# Patient Record
Sex: Male | Born: 1945 | Race: White | Hispanic: No | Marital: Married | Smoking: Never smoker
Health system: Southern US, Community
[De-identification: ages and names within clinical notes are randomized; demographics above are authoritative.]

## PROBLEM LIST (undated history)

## (undated) DIAGNOSIS — U071 COVID-19: Secondary | ICD-10-CM

## (undated) DIAGNOSIS — I442 Atrioventricular block, complete: Secondary | ICD-10-CM

## (undated) DIAGNOSIS — M1711 Unilateral primary osteoarthritis, right knee: Secondary | ICD-10-CM

## (undated) DIAGNOSIS — K4020 Bilateral inguinal hernia, without obstruction or gangrene, not specified as recurrent: Secondary | ICD-10-CM

## (undated) DIAGNOSIS — J339 Nasal polyp, unspecified: Secondary | ICD-10-CM

## (undated) DIAGNOSIS — T7020XA Unspecified effects of high altitude, initial encounter: Secondary | ICD-10-CM

## (undated) DIAGNOSIS — J45909 Unspecified asthma, uncomplicated: Secondary | ICD-10-CM

## (undated) DIAGNOSIS — I441 Atrioventricular block, second degree: Secondary | ICD-10-CM

## (undated) DIAGNOSIS — M75101 Unspecified rotator cuff tear or rupture of right shoulder, not specified as traumatic: Secondary | ICD-10-CM

## (undated) DIAGNOSIS — M1990 Unspecified osteoarthritis, unspecified site: Secondary | ICD-10-CM

## (undated) DIAGNOSIS — G4700 Insomnia, unspecified: Secondary | ICD-10-CM

## (undated) HISTORY — PX: REFRACTIVE SURGERY: SHX103

## (undated) HISTORY — PX: ROTATOR CUFF REPAIR: SHX139

## (undated) HISTORY — PX: HERNIA REPAIR: SHX51

## (undated) HISTORY — DX: Atrioventricular block, complete: I44.2

## (undated) HISTORY — PX: NASAL SINUS SURGERY: SHX719

## (undated) HISTORY — PX: EYE SURGERY: SHX253

## (undated) HISTORY — DX: Nasal polyp, unspecified: J33.9

## (undated) HISTORY — DX: Atrioventricular block, second degree: I44.1

## (undated) HISTORY — DX: Unspecified asthma, uncomplicated: J45.909

## (undated) HISTORY — DX: COVID-19: U07.1

## (undated) HISTORY — DX: Bilateral inguinal hernia, without obstruction or gangrene, not specified as recurrent: K40.20

## (undated) HISTORY — DX: Insomnia, unspecified: G47.00

## (undated) HISTORY — DX: Unilateral primary osteoarthritis, right knee: M17.11

## (undated) HISTORY — PX: CATARACT EXTRACTION W/ INTRAOCULAR LENS IMPLANT: SHX1309

## (undated) HISTORY — DX: Unspecified effects of high altitude, initial encounter: T70.20XA

## (undated) HISTORY — DX: Unspecified osteoarthritis, unspecified site: M19.90

## (undated) HISTORY — PX: BACK SURGERY: SHX140

## (undated) HISTORY — PX: CARDIAC CATHETERIZATION: SHX172

## (undated) HISTORY — PX: LUMBAR LAMINECTOMY: SHX95

---

## 1999-07-04 ENCOUNTER — Ambulatory Visit (HOSPITAL_BASED_OUTPATIENT_CLINIC_OR_DEPARTMENT_OTHER): Admission: RE | Admit: 1999-07-04 | Discharge: 1999-07-04 | Payer: Self-pay | Admitting: *Deleted

## 2003-10-11 ENCOUNTER — Ambulatory Visit (HOSPITAL_COMMUNITY): Admission: RE | Admit: 2003-10-11 | Discharge: 2003-10-11 | Payer: Self-pay | Admitting: Orthopedic Surgery

## 2006-05-09 ENCOUNTER — Ambulatory Visit: Payer: Self-pay | Admitting: Family Medicine

## 2006-09-25 ENCOUNTER — Ambulatory Visit: Payer: Self-pay | Admitting: Family Medicine

## 2007-10-27 ENCOUNTER — Ambulatory Visit: Payer: Self-pay | Admitting: Family Medicine

## 2008-07-06 ENCOUNTER — Ambulatory Visit: Payer: Self-pay | Admitting: Family Medicine

## 2008-08-31 ENCOUNTER — Ambulatory Visit: Payer: Self-pay | Admitting: Family Medicine

## 2008-10-26 ENCOUNTER — Ambulatory Visit: Payer: Self-pay | Admitting: Family Medicine

## 2008-10-28 ENCOUNTER — Encounter: Admission: RE | Admit: 2008-10-28 | Discharge: 2008-10-28 | Payer: Self-pay | Admitting: Family Medicine

## 2008-10-28 IMAGING — CR DG CHEST 2V
2 series · 2 of 2 positions shown · non-contrast
Comparison: None

CLINICAL DATA: Chronic cough, wheezing

CHEST - 2 VIEW

[view not recorded (1 of 2)]
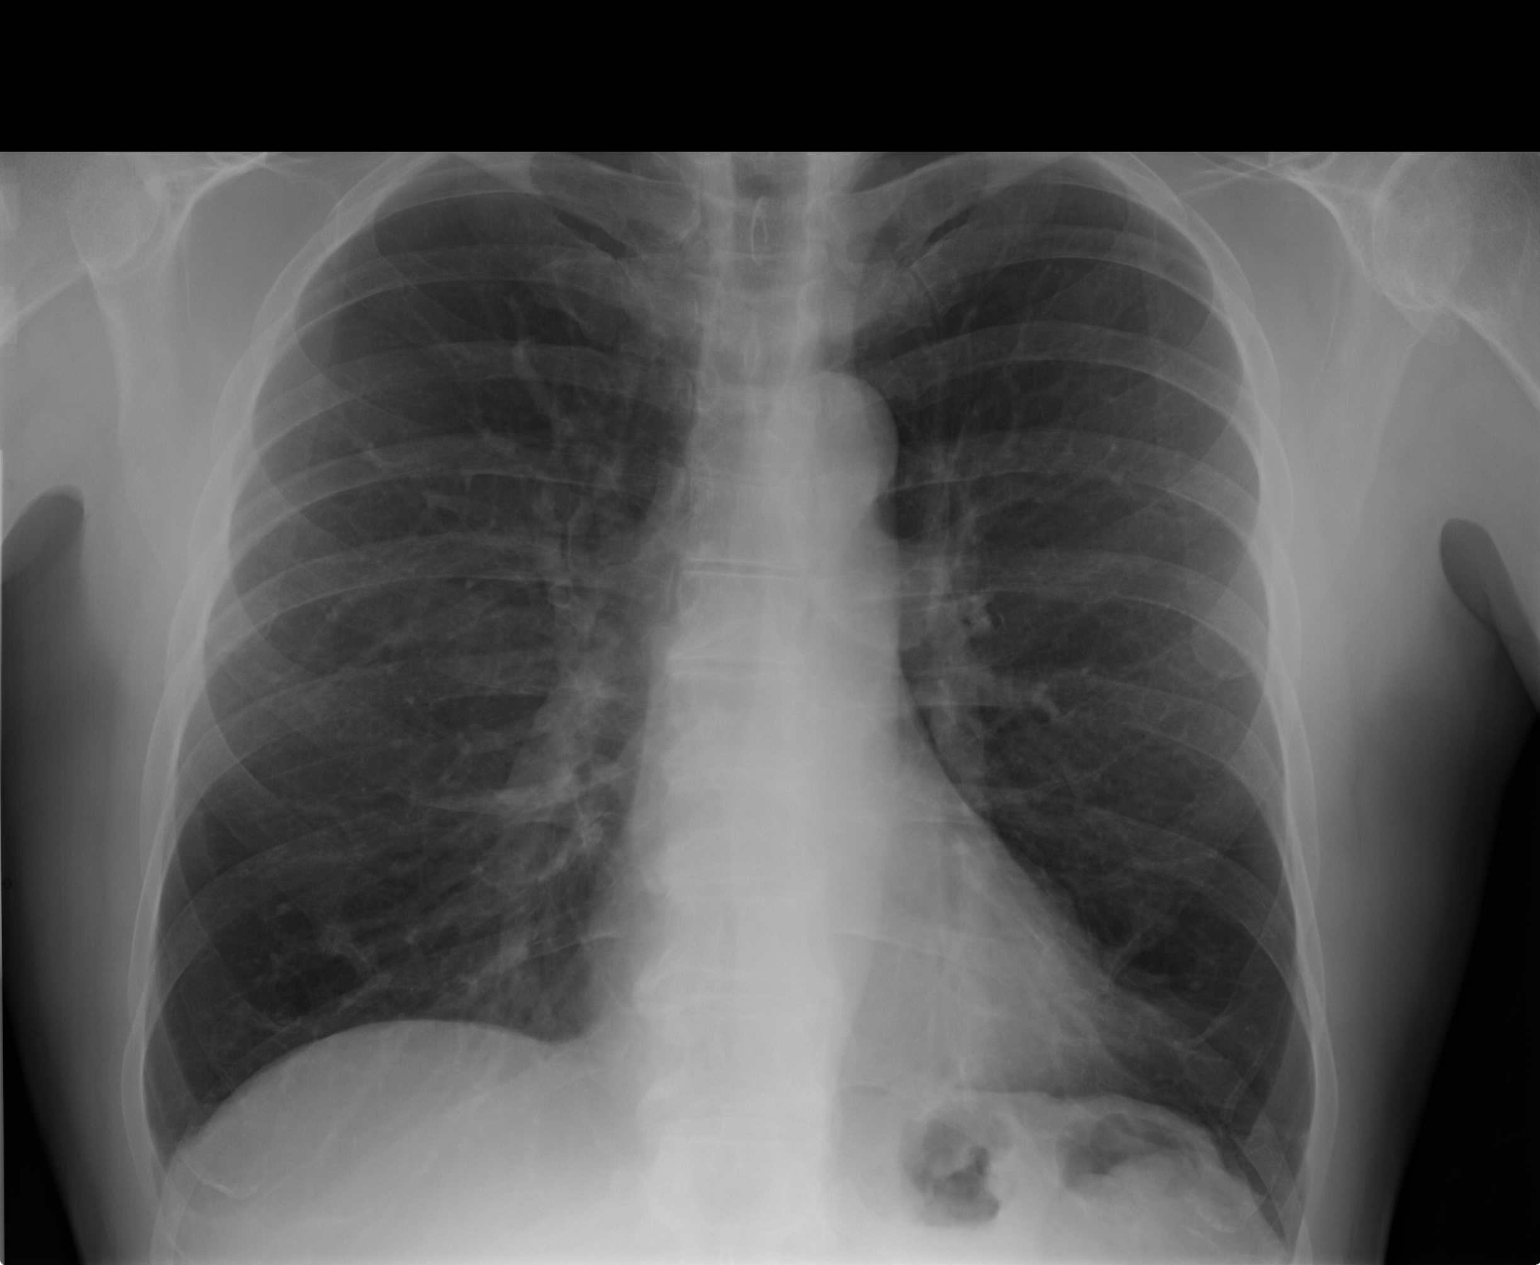

[view not recorded (2 of 2)]
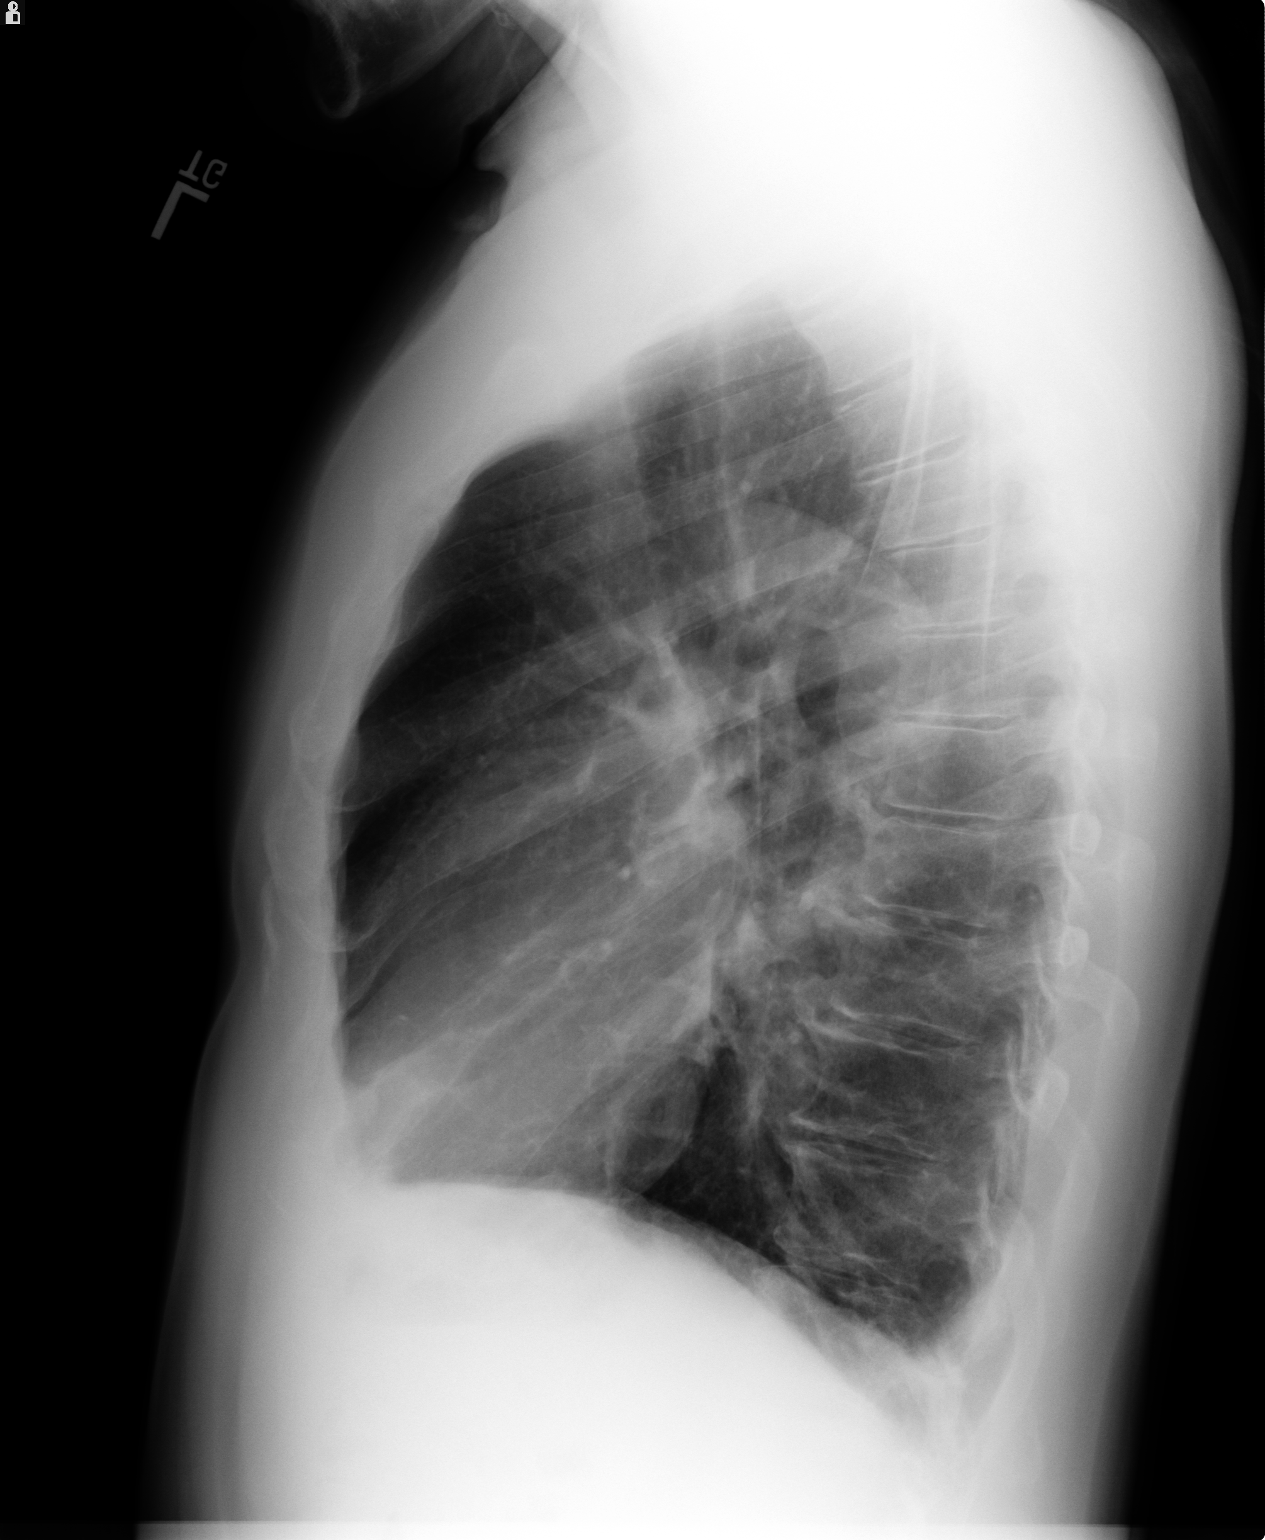

[2 of 2 positions shown; findings below may reference images not displayed]

FINDINGS: The lungs are clear and hyperaerated which may indicate
mild COPD.  No active infiltrate or effusion is seen. The heart is
within normal limits in size. There are degenerative changes
throughout the thoracic spine.
IMPRESSION: Hyperaeration suggests COPD.  No active process is seen.

## 2008-11-22 ENCOUNTER — Ambulatory Visit: Payer: Self-pay | Admitting: Family Medicine

## 2009-11-23 ENCOUNTER — Ambulatory Visit: Payer: Self-pay | Admitting: Physician Assistant

## 2009-11-30 LAB — HM COLONOSCOPY

## 2010-09-22 ENCOUNTER — Encounter: Payer: Self-pay | Admitting: Orthopedic Surgery

## 2011-03-14 ENCOUNTER — Encounter: Payer: Self-pay | Admitting: Family Medicine

## 2011-08-01 ENCOUNTER — Other Ambulatory Visit: Payer: Self-pay | Admitting: Orthopedic Surgery

## 2011-08-02 ENCOUNTER — Encounter (HOSPITAL_BASED_OUTPATIENT_CLINIC_OR_DEPARTMENT_OTHER): Payer: Self-pay | Admitting: *Deleted

## 2011-08-05 NOTE — Pre-Procedure Instructions (Signed)
Called x 3 for EKG to Dr. Joaquin Bend office, not received; will need EKG DOS.

## 2011-08-06 ENCOUNTER — Encounter (HOSPITAL_BASED_OUTPATIENT_CLINIC_OR_DEPARTMENT_OTHER): Payer: Self-pay | Admitting: Anesthesiology

## 2011-08-06 ENCOUNTER — Encounter (HOSPITAL_BASED_OUTPATIENT_CLINIC_OR_DEPARTMENT_OTHER): Payer: Self-pay | Admitting: Certified Registered"

## 2011-08-06 ENCOUNTER — Ambulatory Visit (HOSPITAL_BASED_OUTPATIENT_CLINIC_OR_DEPARTMENT_OTHER): Payer: BC Managed Care – PPO | Admitting: Anesthesiology

## 2011-08-06 ENCOUNTER — Ambulatory Visit (HOSPITAL_BASED_OUTPATIENT_CLINIC_OR_DEPARTMENT_OTHER)
Admission: RE | Admit: 2011-08-06 | Discharge: 2011-08-07 | Disposition: A | Discharge status: Home / Self Care | Payer: BC Managed Care – PPO | Source: Ambulatory Visit | Attending: Orthopedic Surgery | Admitting: Orthopedic Surgery

## 2011-08-06 ENCOUNTER — Encounter (HOSPITAL_BASED_OUTPATIENT_CLINIC_OR_DEPARTMENT_OTHER)
Admission: RE | Disposition: A | Discharge status: Home / Self Care | Payer: Self-pay | Source: Ambulatory Visit | Attending: Orthopedic Surgery

## 2011-08-06 ENCOUNTER — Encounter (HOSPITAL_BASED_OUTPATIENT_CLINIC_OR_DEPARTMENT_OTHER): Payer: Self-pay

## 2011-08-06 DIAGNOSIS — Z01812 Encounter for preprocedural laboratory examination: Secondary | ICD-10-CM | POA: Insufficient documentation

## 2011-08-06 DIAGNOSIS — M19019 Primary osteoarthritis, unspecified shoulder: Secondary | ICD-10-CM | POA: Insufficient documentation

## 2011-08-06 DIAGNOSIS — M25819 Other specified joint disorders, unspecified shoulder: Secondary | ICD-10-CM | POA: Insufficient documentation

## 2011-08-06 DIAGNOSIS — M719 Bursopathy, unspecified: Secondary | ICD-10-CM | POA: Insufficient documentation

## 2011-08-06 DIAGNOSIS — M67919 Unspecified disorder of synovium and tendon, unspecified shoulder: Secondary | ICD-10-CM | POA: Insufficient documentation

## 2011-08-06 DIAGNOSIS — J45909 Unspecified asthma, uncomplicated: Secondary | ICD-10-CM | POA: Insufficient documentation

## 2011-08-06 DIAGNOSIS — Z5333 Arthroscopic surgical procedure converted to open procedure: Secondary | ICD-10-CM | POA: Insufficient documentation

## 2011-08-06 HISTORY — PX: SHOULDER ARTHROSCOPY: SHX128

## 2011-08-06 HISTORY — DX: Unspecified rotator cuff tear or rupture of right shoulder, not specified as traumatic: M75.101

## 2011-08-06 SURGERY — ARTHROSCOPY, SHOULDER
Anesthesia: General | Site: Shoulder | Laterality: Right | Wound class: Clean

## 2011-08-06 MED ORDER — SODIUM CHLORIDE 0.9 % IR SOLN
Status: DC | PRN
  Administered 2011-08-06: 5

## 2011-08-06 MED ORDER — MIDAZOLAM HCL 2 MG/2ML IJ SOLN
1.0000 mg | INTRAMUSCULAR | Status: DC | PRN
  Administered 2011-08-06: 2 mg via INTRAVENOUS

## 2011-08-06 MED ORDER — ASPIRIN 81 MG PO TABS: 81.0000 mg | ORAL_TABLET | Freq: Every day | ORAL | Status: DC

## 2011-08-06 MED ORDER — FENTANYL CITRATE 0.05 MG/ML IJ SOLN
50.0000 ug | INTRAMUSCULAR | Status: DC | PRN
  Administered 2011-08-06: 100 ug via INTRAVENOUS

## 2011-08-06 MED ORDER — METOCLOPRAMIDE HCL 5 MG PO TABS: 5.0000 mg | ORAL_TABLET | Freq: Three times a day (TID) | ORAL | Status: DC | PRN

## 2011-08-06 MED ORDER — ZOLPIDEM TARTRATE 10 MG PO TABS
10.0000 mg | ORAL_TABLET | Freq: Every evening | ORAL | Status: DC | PRN
  Administered 2011-08-06: 10 mg via ORAL

## 2011-08-06 MED ORDER — EPHEDRINE SULFATE 50 MG/ML IJ SOLN
INTRAMUSCULAR | Status: DC | PRN
  Administered 2011-08-06 (×3): 10 mg via INTRAVENOUS

## 2011-08-06 MED ORDER — ACETAMINOPHEN 10 MG/ML IV SOLN
1000.0000 mg | Freq: Once | INTRAVENOUS | Status: AC
  Administered 2011-08-06 (×2): 1000 mg via INTRAVENOUS

## 2011-08-06 MED ORDER — CEPHALEXIN 500 MG PO CAPS: 500.0000 mg | ORAL_CAPSULE | Freq: Three times a day (TID) | ORAL | Status: AC

## 2011-08-06 MED ORDER — ROCURONIUM BROMIDE 100 MG/10ML IV SOLN
INTRAVENOUS | Status: DC | PRN
  Administered 2011-08-06: 40 mg via INTRAVENOUS

## 2011-08-06 MED ORDER — PROMETHAZINE HCL 25 MG/ML IJ SOLN: 6.2500 mg | INTRAMUSCULAR | Status: DC | PRN

## 2011-08-06 MED ORDER — PROPOFOL 10 MG/ML IV EMUL
INTRAVENOUS | Status: DC | PRN
  Administered 2011-08-06: 180 mg via INTRAVENOUS

## 2011-08-06 MED ORDER — CEFAZOLIN SODIUM 1-5 GM-% IV SOLN: 1.0000 g | Freq: Once | INTRAVENOUS | Status: DC

## 2011-08-06 MED ORDER — ONDANSETRON HCL 4 MG/2ML IJ SOLN: 4.0000 mg | Freq: Four times a day (QID) | INTRAMUSCULAR | Status: DC | PRN

## 2011-08-06 MED ORDER — LIDOCAINE HCL (CARDIAC) 20 MG/ML IV SOLN
INTRAVENOUS | Status: DC | PRN
  Administered 2011-08-06: 20 mg via INTRAVENOUS

## 2011-08-06 MED ORDER — ACETAMINOPHEN 650 MG RE SUPP: 650.0000 mg | Freq: Four times a day (QID) | RECTAL | Status: DC | PRN

## 2011-08-06 MED ORDER — DEXAMETHASONE SODIUM PHOSPHATE 4 MG/ML IJ SOLN
INTRAMUSCULAR | Status: DC | PRN
  Administered 2011-08-06: 10 mg via INTRAVENOUS

## 2011-08-06 MED ORDER — FENTANYL CITRATE 0.05 MG/ML IJ SOLN
INTRAMUSCULAR | Status: DC | PRN
  Administered 2011-08-06: 25 ug via INTRAVENOUS
  Administered 2011-08-06: 50 ug via INTRAVENOUS

## 2011-08-06 MED ORDER — BUDESONIDE-FORMOTEROL FUMARATE 160-4.5 MCG/ACT IN AERO
2.0000 | INHALATION_SPRAY | Freq: Two times a day (BID) | RESPIRATORY_TRACT | Status: DC
  Administered 2011-08-06: 2 via RESPIRATORY_TRACT

## 2011-08-06 MED ORDER — ACETAMINOPHEN 325 MG PO TABS: 650.0000 mg | ORAL_TABLET | Freq: Four times a day (QID) | ORAL | Status: DC | PRN

## 2011-08-06 MED ORDER — ONDANSETRON HCL 4 MG PO TABS: 4.0000 mg | ORAL_TABLET | Freq: Four times a day (QID) | ORAL | Status: DC | PRN

## 2011-08-06 MED ORDER — LACTATED RINGERS IV SOLN
INTRAVENOUS | Status: DC
  Administered 2011-08-06 (×4): via INTRAVENOUS

## 2011-08-06 MED ORDER — MIDAZOLAM HCL 5 MG/5ML IJ SOLN
INTRAMUSCULAR | Status: DC | PRN
  Administered 2011-08-06: 1 mg via INTRAVENOUS

## 2011-08-06 MED ORDER — ONDANSETRON HCL 4 MG/2ML IJ SOLN
INTRAMUSCULAR | Status: DC | PRN
  Administered 2011-08-06: 4 mg via INTRAVENOUS

## 2011-08-06 MED ORDER — METOCLOPRAMIDE HCL 5 MG/ML IJ SOLN: 5.0000 mg | Freq: Three times a day (TID) | INTRAMUSCULAR | Status: DC | PRN

## 2011-08-06 MED ORDER — CEFAZOLIN SODIUM 1-5 GM-% IV SOLN
1.0000 g | Freq: Four times a day (QID) | INTRAVENOUS | Status: AC
  Administered 2011-08-06 – 2011-08-07 (×3): 1 g via INTRAVENOUS

## 2011-08-06 MED ORDER — PHENOL 1.4 % MT LIQD: 1.0000 | OROMUCOSAL | Status: DC | PRN

## 2011-08-06 MED ORDER — MENTHOL 3 MG MT LOZG: 1.0000 | LOZENGE | OROMUCOSAL | Status: DC | PRN

## 2011-08-06 MED ORDER — HYDROMORPHONE HCL 2 MG PO TABS: 2.0000 mg | ORAL_TABLET | ORAL | Status: DC | PRN

## 2011-08-06 MED ORDER — CHLORHEXIDINE GLUCONATE 4 % EX LIQD: 60.0000 mL | Freq: Once | CUTANEOUS | Status: DC

## 2011-08-06 MED ORDER — HYDROMORPHONE HCL 2 MG PO TABS: ORAL_TABLET | ORAL | Status: AC

## 2011-08-06 MED ORDER — BUPIVACAINE-EPINEPHRINE PF 0.5-1:200000 % IJ SOLN
INTRAMUSCULAR | Status: DC | PRN
  Administered 2011-08-06: 25 mL

## 2011-08-06 MED ORDER — HYDROMORPHONE HCL PF 1 MG/ML IJ SOLN: 0.2500 mg | INTRAMUSCULAR | Status: DC | PRN

## 2011-08-06 MED ORDER — DEXTROSE-NACL 5-0.45 % IV SOLN
INTRAVENOUS | Status: DC
  Administered 2011-08-06: 17:00:00 via INTRAVENOUS

## 2011-08-06 SURGICAL SUPPLY — 92 items
ANCH SUT SWLK 19.1 CLS EYLT TL (Anchor) ×1 IMPLANT
ANCH SUT SWLK 19.1 CLS EYLT VT (Anchor) ×1 IMPLANT
ANCH SUT SWLK 19.1X4.75 (Anchor) ×3 IMPLANT
ANCHOR BIO SWLOCK 4.75 W/TIG (Anchor) ×1 IMPLANT
ANCHOR SUT BIO SW 4.75 W/FIB (Anchor) ×1 IMPLANT
ANCHOR SUT BIO SW 4.75X19.1 (Anchor) ×3 IMPLANT
BANDAGE ADHESIVE 1X3 (GAUZE/BANDAGES/DRESSINGS) IMPLANT
BLADE AVERAGE 25X9 (BLADE) IMPLANT
BLADE CUTTER MENIS 5.5 (BLADE) IMPLANT
BLADE SURG 15 STRL LF DISP TIS (BLADE) ×2 IMPLANT
BLADE SURG 15 STRL SS (BLADE) ×4
BLADE VORTEX 6.0 (BLADE) ×1 IMPLANT
BUR EGG/OVAL CARBIDE (BURR) ×1 IMPLANT
BUR OVAL 6.0 (BURR) ×1 IMPLANT
CANISTER OMNI JUG 16 LITER (MISCELLANEOUS) ×1 IMPLANT
CANISTER SUCTION 2500CC (MISCELLANEOUS) ×3 IMPLANT
CANNULA 5.75X7 CRYSTAL CLEAR (CANNULA) IMPLANT
CANNULA SHOULDER 7CM (CANNULA) IMPLANT
CANNULA TWIST IN 8.25X7CM (CANNULA) ×1 IMPLANT
CLEANER CAUTERY TIP 5X5 PAD (MISCELLANEOUS) IMPLANT
CLOTH BEACON ORANGE TIMEOUT ST (SAFETY) ×2 IMPLANT
CUTTER MENISCUS  4.2MM (BLADE) ×1
CUTTER MENISCUS 4.2MM (BLADE) ×1 IMPLANT
DECANTER SPIKE VIAL GLASS SM (MISCELLANEOUS) IMPLANT
DRAPE INCISE IOBAN 66X45 STRL (DRAPES) ×2 IMPLANT
DRAPE STERI 35X30 U-POUCH (DRAPES) ×2 IMPLANT
DRAPE SURG 17X23 STRL (DRAPES) ×2 IMPLANT
DRAPE U-SHAPE 47X51 STRL (DRAPES) ×2 IMPLANT
DRAPE U-SHAPE 76X120 STRL (DRAPES) ×4 IMPLANT
DRSG PAD ABDOMINAL 8X10 ST (GAUZE/BANDAGES/DRESSINGS) ×2 IMPLANT
DURAPREP 26ML APPLICATOR (WOUND CARE) ×2 IMPLANT
ELECT REM PT RETURN 9FT ADLT (ELECTROSURGICAL) ×2
ELECTRODE REM PT RTRN 9FT ADLT (ELECTROSURGICAL) IMPLANT
GAUZE SPONGE 4X4 12PLY STRL LF (GAUZE/BANDAGES/DRESSINGS) ×1 IMPLANT
GLOVE BIO SURGEON STRL SZ 6.5 (GLOVE) ×1 IMPLANT
GLOVE BIOGEL M STRL SZ7.5 (GLOVE) ×2 IMPLANT
GLOVE BIOGEL PI IND STRL 7.0 (GLOVE) IMPLANT
GLOVE BIOGEL PI IND STRL 8 (GLOVE) ×2 IMPLANT
GLOVE BIOGEL PI INDICATOR 7.0 (GLOVE) ×1
GLOVE BIOGEL PI INDICATOR 8 (GLOVE) ×2
GLOVE ORTHO TXT STRL SZ7.5 (GLOVE) ×2 IMPLANT
GOWN BRE IMP PREV XXLGXLNG (GOWN DISPOSABLE) ×1 IMPLANT
GOWN PREVENTION PLUS XLARGE (GOWN DISPOSABLE) ×2 IMPLANT
GOWN PREVENTION PLUS XXLARGE (GOWN DISPOSABLE) ×2 IMPLANT
GOWN STRL REIN 2XL XLG LVL4 (GOWN DISPOSABLE) ×1 IMPLANT
LASSO SUT 90 DEGREE (SUTURE) ×1 IMPLANT
NDL SCORPION (NEEDLE) ×1 IMPLANT
NDL SUT 6 .5 CRC .975X.05 MAYO (NEEDLE) IMPLANT
NEEDLE MAYO TAPER (NEEDLE)
NEEDLE MINI RC 24MM (NEEDLE) IMPLANT
NEEDLE SCORPION (NEEDLE) ×2 IMPLANT
PACK ARTHROSCOPY DSU (CUSTOM PROCEDURE TRAY) ×2 IMPLANT
PACK BASIN DAY SURGERY FS (CUSTOM PROCEDURE TRAY) ×2 IMPLANT
PAD CLEANER CAUTERY TIP 5X5 (MISCELLANEOUS)
PASSER SUT SWANSON 36MM LOOP (INSTRUMENTS) IMPLANT
PENCIL BUTTON HOLSTER BLD 10FT (ELECTRODE) ×1 IMPLANT
PENCIL FOOT CONTROL (ELECTRODE) IMPLANT
RESECTOR FULL RADIUS 4.2MM (BLADE) IMPLANT
RESECTOR FULL RADIUS 4.8MM (BLADE) IMPLANT
SLEEVE SCD COMPRESS KNEE MED (MISCELLANEOUS) ×2 IMPLANT
SLING ARM FOAM STRAP LRG (SOFTGOODS) ×1 IMPLANT
SPONGE GAUZE 4X4 12PLY (GAUZE/BANDAGES/DRESSINGS) ×2 IMPLANT
SPONGE LAP 4X18 X RAY DECT (DISPOSABLE) ×2 IMPLANT
STRIP CLOSURE SKIN 1/2X4 (GAUZE/BANDAGES/DRESSINGS) ×1 IMPLANT
SUCTION FRAZIER TIP 10 FR DISP (SUCTIONS) IMPLANT
SUT ETHIBOND 2 OS 4 DA (SUTURE) IMPLANT
SUT ETHILON 4 0 PS 2 18 (SUTURE) IMPLANT
SUT FIBERWIRE #2 38 T-5 BLUE (SUTURE)
SUT FIBERWIRE 3-0 18 TAPR NDL (SUTURE)
SUT PROLENE 1 CT (SUTURE) IMPLANT
SUT PROLENE 3 0 PS 2 (SUTURE) ×2 IMPLANT
SUT TIGER TAPE 7 IN WHITE (SUTURE) IMPLANT
SUT VIC AB 0 CT1 27 (SUTURE)
SUT VIC AB 0 CT1 27XBRD ANBCTR (SUTURE) IMPLANT
SUT VIC AB 0 SH 27 (SUTURE) IMPLANT
SUT VIC AB 2-0 SH 27 (SUTURE) ×2
SUT VIC AB 2-0 SH 27XBRD (SUTURE) IMPLANT
SUT VIC AB 3-0 SH 27 (SUTURE)
SUT VIC AB 3-0 SH 27X BRD (SUTURE) IMPLANT
SUT VIC AB 3-0 X1 27 (SUTURE) IMPLANT
SUTURE FIBERWR #2 38 T-5 BLUE (SUTURE) IMPLANT
SUTURE FIBERWR 3-0 18 TAPR NDL (SUTURE) IMPLANT
SYR 3ML 23GX1 SAFETY (SYRINGE) IMPLANT
SYR BULB 3OZ (MISCELLANEOUS) IMPLANT
TAPE FIBER 2MM 7IN #2 BLUE (SUTURE) IMPLANT
TAPE PAPER 3X10 WHT MICROPORE (GAUZE/BANDAGES/DRESSINGS) ×2 IMPLANT
TOWEL OR 17X24 6PK STRL BLUE (TOWEL DISPOSABLE) ×2 IMPLANT
TUBE CONNECTING 20X1/4 (TUBING) ×2 IMPLANT
TUBING ARTHROSCOPY IRRIG 16FT (MISCELLANEOUS) IMPLANT
WAND STAR VAC 90 (SURGICAL WAND) ×2 IMPLANT
WATER STERILE IRR 1000ML POUR (IV SOLUTION) ×2 IMPLANT
YANKAUER SUCT BULB TIP NO VENT (SUCTIONS) IMPLANT

## 2011-08-06 NOTE — Transfer of Care (Signed)
Immediate Anesthesia Transfer of Care Note  Patient: David [name]  Procedure(s) Performed:  ARTHROSCOPY SHOULDER - with subacromial decompression, arthroscopic subscapularis repair, and rotator cuff repair  Patient Location: PACU  Anesthesia Type: GA combined with regional for post-op pain  Level of Consciousness: awake, alert  and patient cooperative  Airway & Oxygen Therapy: Patient Spontanous Breathing and Patient connected to face mask oxygen  Post-op Assessment: Report given to PACU RN and Post -op Vital signs reviewed and stable  Post vital signs: Reviewed and stable  Complications: No apparent anesthesia complications

## 2011-08-06 NOTE — Anesthesia Postprocedure Evaluation (Signed)
  Anesthesia Post-op Note  Patient: David [name]  Procedure(s) Performed:  ARTHROSCOPY SHOULDER - with subacromial decompression, arthroscopic subscapularis repair, and rotator cuff repair  Patient Location: PACU  Anesthesia Type: GA combined with regional for post-op pain  Level of Consciousness: awake, alert  and oriented  Airway and Oxygen Therapy: Patient Spontanous Breathing  Post-op Pain: none  Post-op Assessment: Post-op Vital signs reviewed, Patient's Cardiovascular Status Stable, Respiratory Function Stable, Patent Airway, No signs of Nausea or vomiting, Adequate PO intake and Pain level controlled  Post-op Vital Signs: stable  Complications: No apparent anesthesia complications

## 2011-08-06 NOTE — Anesthesia Preprocedure Evaluation (Addendum)
Anesthesia Evaluation  Patient identified by MRN, date of birth, ID band Patient awake    Reviewed: Allergy & Precautions, H&P , NPO status , Patient's Chart, lab work & pertinent test results  Airway Mallampati: I TM Distance: >3 FB Neck ROM: Full    Dental   Pulmonary asthma ,    Pulmonary exam normal       Cardiovascular     Neuro/Psych    GI/Hepatic   Endo/Other    Renal/GU      Musculoskeletal   Abdominal   Peds  Hematology   Anesthesia Other Findings   Reproductive/Obstetrics                           Anesthesia Physical Anesthesia Plan  ASA: II  Anesthesia Plan: General   Post-op Pain Management:    Induction: Intravenous  Airway Management Planned: Oral ETT  Additional Equipment:   Intra-op Plan:   Post-operative Plan: Extubation in OR  Informed Consent: I have reviewed the patients History and Physical, chart, labs and discussed the procedure including the risks, benefits and alternatives for the proposed anesthesia with the patient or authorized representative who has indicated his/her understanding and acceptance.     Plan Discussed with: CRNA and Surgeon  Anesthesia Plan Comments:         Anesthesia Quick Evaluation

## 2011-08-06 NOTE — Brief Op Note (Signed)
08/06/2011  3:46 PM  PATIENT:  David [name]  65 y.o. 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           2)   Arthroscopic repair of subscapularis           3)  Subacromial decompression and bursectomy           4)  Arthroscopic distal clavcile excision           5)  Reconstruction of supraspinatus and infraspinatus by hybrid       technique SURGEON:  Surgeon(s): Wyn Forster., MD  PHYSICIAN ASSISTANT:   ASSISTANTS: Annye Rusk, P.A.-C  ANESTHESIA:   general  EBL:  Total I/O In: 1000 [I.V.:1000] Out: -   BLOOD ADMINISTERED:none  DRAINS: none   LOCAL MEDICATIONS USED:  NONE  SPECIMEN:  No Specimen  DISPOSITION OF SPECIMEN:  N/A  COUNTS:  YES  TOURNIQUET:  * No tourniquets in log *  DICTATION: .Other Dictation: Dictation Number [id number]  PLAN OF CARE: Admit for overnight observation  PATIENT DISPOSITION:  PACU - hemodynamically stable.

## 2011-08-06 NOTE — Anesthesia Procedure Notes (Addendum)
Anesthesia Regional Block:  Interscalene brachial plexus block  Pre-Anesthetic Checklist: ,, timeout performed, Correct Patient, Correct Site, Correct Laterality, Correct Procedure, Correct Position, site marked, Risks and benefits discussed,  Surgical consent,  Pre-op evaluation,  At surgeon's request and post-op pain management  Laterality: Right  Prep: chloraprep       Needles:  Injection technique: Single-shot  Needle Type: Stimulator Needle - 40      Needle Gauge: 22 and 22 G    Additional Needles:  Procedures: nerve stimulator Interscalene brachial plexus block  Nerve Stimulator or Paresthesia:  Response: 0.5 mA,   Additional Responses:   Narrative:  Start time: 08/06/2011 12:10 PM Injection made incrementally with aspirations every 5 mL.  Performed by: Personally   Additional Notes: Pop Iv sed 22 stim needle w/ stim down to .5ma Multiple neg asp  No compl Dr Gypsy Balsam   Anesthesia Regional Block:  Interscalene brachial plexus blockInterscalene brachial plexus block Narrative:  End time: 08/06/2011 12:28 PM   Procedure Name: Intubation Date/Time: 08/06/2011 1:52 PM Performed by: Radford Pax Pre-anesthesia Checklist: Patient identified, Emergency Drugs available, Suction available, Patient being monitored and Timeout performed Patient Re-evaluated:Patient Re-evaluated prior to inductionOxygen Delivery Method: Circle System Utilized Preoxygenation: Pre-oxygenation with 100% oxygen Intubation Type: IV induction Ventilation: Mask ventilation without difficulty Laryngoscope Size: Miller and 3 (cords open and clear) Grade View: Grade I Tube type: Oral (cords deep) Tube size: 8.0 mm Number of attempts: 1 (atraumaticand easy intubation) Airway Equipment and Method: stylet and oral airway Placement Confirmation: ETT inserted through vocal cords under direct vision,  positive ETCO2 and breath sounds checked- equal and bilateral Secured at: 21 cm Tube secured  with: Tape (secured with pink tape) Dental Injury: Teeth and Oropharynx as per pre-operative assessment

## 2011-08-06 NOTE — H&P (Signed)
  David [name] is an 65 y.o. male.   Chief Complaint: c/o right shoulder pain and weakness HPI: Pt is a 65 y/o right handed male who sustained an injury to his right shoulder while exercising about  1 month ago. He felt an immediate pop in the shoulder with a grinding sensation. He presented for evaluation and treatment.  Past Medical History  Diagnosis Date  . Asthma     exercise-induced; prn inhaler  . Rotator cuff tear, right     impingement, AC arthrosis    Past Surgical History  Procedure Date  . Lumbar laminectomy age 30s    History reviewed. No pertinent family history. Social History:  reports that he has never smoked. He has never used smokeless tobacco. He reports that he drinks alcohol. He reports that he does not use illicit drugs.  Allergies:  Allergies  Allergen Reactions  . Codeine Itching    No current facility-administered medications on file as of 08/06/2011.   Medications Prior to Admission  Medication Sig Dispense Refill  . aspirin 81 MG tablet Take 81 mg by mouth daily.        . budesonide-formoterol (SYMBICORT) 160-4.5 MCG/ACT inhaler Inhale 2 puffs into the lungs 2 (two) times daily.        . multivitamin (THERAGRAN) per tablet Take 1 tablet by mouth daily.          No results found for this or any previous visit (from the past 48 hour(s)).  No results found.   Pertinent items are noted in HPI.  Height 5\' 9"  (1.753 m), weight 72.576 kg (160 lb).  General appearance: alert Head: Normocephalic, without obvious abnormality Neck: supple, symmetrical, trachea midline Resp: clear to auscultation bilaterally Cardio: regular rate and rhythm, S1, S2 normal, no murmur, click, rub or gallop GI: normal findings: bowel sounds normal Extremities:. His shoulder ROM reveals combined elevation right shoulder 160, left shoulder 175. He has stiffness in the left shoulder with internal/external rotation due to prior dislocation and remodeling. He has limitation of  internal rotation of the right shoulder T8 vs T12 on the left due to chronic stiffness. He has marked weakness of scaption, forward flexion and abduction. He has marked pain with Hawkins maneuver and resisted external rotation at 90 degrees abduction.   Plain films of the shoulder demonstrates marked AC degenerative arthritis with outlet closure. He has some remodeling of the greater tuberosity. His glenohumeral joint is well preserved.  Pulses: 2+ and symmetric Skin: normal Neurologic: Grossly normal    Assessment/Plan  Full thickness distal supraspinatus insertional tear right shoulder.   Plan: To OR for right shoulder scope with SAD/DCR and repair of RC as needed.  DASNOIT,Beverley J 08/06/2011, 11:11 AM    H&P documentation: 08/06/11  -History and Physical Reviewed  -Patient has been re-examined  -No change in the plan of care  Wyn Forster, MD

## 2011-08-06 NOTE — H&P (Signed)
RE: David [name]     Seen by: Katy Fitch Naaman Plummer., MD   OFFICE VISIT   07-17-11    DOB: 02-Feb-2046  David [name] is a 65 year old right hand dominant gentleman who is the President of [employer]. I have evaluated him in the past for epicondylitis and have cared for his wife on a number of occasions for chronic elbow tendinopathy.   David [name] sustained a significant injury to his right shoulder while performing push ups at home on 07-12-11. He has been very active working out throughout his adult life. He frequently works out with free weights and does calisthenics on a regular basis at home. During his 2nd push up on 07-12-11 he felt an immediate pop in his shoulder and a grinding sensation. He has been unable to abduct his shoulder and has lost his normal shoulder rhythm in abduction and forward flexion. He seeks an upper extremity orthopaedic consult.   His past history is reviewed in detail. He is allergic to Codeine. Current medications include Symbicort for asthma. He has had prior partial tear of his right biceps at the elbow that improved with simple rest. He has had prior spinal surgery in 1975. His social history reveals that he is married. He is a nonsmoker. He enjoys an occasional alcoholic beverage. His family history is detailed and negative for diabetes, coronary artery disease, hypertension and arthritis. A 14 system review of systems is detailed and reveals asthma diagnosed in 2005.  Physical exam reveals a very fit well appearing 65 year old gentleman. His shoulder ROM reveals combined elevation right shoulder 160, left shoulder 175. He has stiffness in the left shoulder with internal/external rotation due to prior dislocation and remodeling. He has limitation of internal rotation of the right shoulder T8 vs T12 on the left due to chronic stiffness. He has marked weakness of scaption, forward flexion and abduction. He has marked pain with Hawkins maneuver and resisted external rotation at  90 degrees abduction.   Plain films of the shoulder demonstrates marked AC degenerative arthritis with outlet closure. He has some remodeling of the greater tuberosity. His glenohumeral joint is well preserved.   Assessment: Rule out rotator cuff tear with chronic stage II or III impingement.  Plan: He is sent for an MRI of his shoulder. I will see him back next week to interpret his images. Questions were invited and answered in detail.   RVS/phe T: 07-22-11  PATIENT: David [name]      SEEN BY:   Katy Fitch. Naaman Plummer., M.D.   OFFICE VISIT:      11.21.12                   DOB:  [birth year]  David [name] returns to review the MRI of his right shoulder obtained at [facility] 07/19/11.  His MRI reveals a full thickness distal supraspinatus insertional tear that is retracted.  He has advanced AC arthrosis narrowing the subacromial outlet.  He has quite a bit of subacromial and subdeltoid bursal fluid.  He has a sebaceous or hemorrhagic cyst in his posterior deltoid. He appears to have some labral degenerative changes anteriorly superiorly.   I had a detailed discussion with David [name] regarding treatment options for his shoulder. He would be well served by diagnostic arthroscopy, subacromial decompression, distal clavicle resection, labral debridement and repair of the rotator cuff.  The surgery and aftercare were described in detail, questions were invited and answered.  He will contemplate surgery sometime in December.    Katy Fitch Sypher, M.D., Jr./cmf   T:  11.27.12

## 2011-08-06 NOTE — Op Note (Signed)
Op note dictated [phone]

## 2011-08-07 LAB — POCT HEMOGLOBIN-HEMACUE: Hemoglobin: 15.3 g/dL (ref 13.0–17.0)

## 2011-08-07 NOTE — Op Note (Signed)
David [name], David [name]                 ACCOUNT NO.:  [id number]  MEDICAL RECORD NO.:  [id number]  LOCATION:                                 FACILITY:  PHYSICIAN:  Katy Fitch. Samaiya Awadallah, M.D.      DATE OF BIRTH:  DATE OF PROCEDURE:  08/06/2011 DATE OF DISCHARGE:                              OPERATIVE REPORT   PREOPERATIVE DIAGNOSES:  MRI documented full-thickness retracted rotator cuff tear supraspinatus and infraspinatus right shoulder, also MRI documented subscapularis rotator cuff tear, superior 50% with severe acromioclavicular arthropathy, and chronic stage III impingement.  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  OPERATION: 1. Diagnostic arthroscopy, right glenohumeral joint. 2. Arthroscopic debridement of hyaline cartilage. 3. Arthroscopic debridement of labrum. 4. Arthroscopic subacromial debridement with bursectomy,     coracoacromial ligament release, and acromioplasty. 5. Arthroscopic distal clavicle resection. 6. Open hybrid reconstruction of supraspinatus, infraspinatus, rotator     cuff, necrotic degenerative tear utilizing an Arthrex SpeedBridge     technique.  OPERATING SURGEON:  Katy Fitch. Salah Burlison, MD  ASSISTANT:  Marveen Reeks Dasnoit, PA-C  ANESTHESIA:  General by endotracheal technique supplemented by a right plexus block.  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  David [name] is very active, exercising,  and routinely was working out and doing vigorous exercise such as pushups.  During the past 6 months, he has had progressive right shoulder pain.  We had identified that he had significant AC arthropathy and had weakness with abduction and external rotation.  Plain films showed remodeling of the greater tuberosity suggesting a rotator cuff tear.  He was sent for an MRI that confirmed a significant retracted supraspinatus-infraspinatus rotator cuff tear with extensive tendinopathy and a subscapularis superior 50% tear.  We advised David [name] to undergo diagnostic arthroscopy, subacromial decompression, distal clavicle resection, debridement of his joint, arthroscopic repair of the subscapularis, and probable open repair of the supraspinatus and infraspinatus due to the complex degenerative nature of the tear.  Preoperatively, he was advised of potential risks and benefits of surgery.  The goals of surgery are to relieve his pain, improve his strength, and improve his functional capacity.  Our goal is to allow him to return to his active physical lifestyle.  Potential risks include failure to relieve all his pain, infection, hardware failure, need for revision surgery, anesthetic complication, possible infection, and unforeseen possibility that we have not enumerated.  Questions were invited and answered in detail at the office and in the holding area preoperatively.  In the holding area, Dr. Gypsy Balsam provided detailed anesthesia informed consent.  Questions were invited and answered in detail.  Dr. Gypsy Balsam then placed a right plexus block without complication leading to excellent anesthesia of the right upper extremity and forequarter.  Mr.  [name] was then transferred to room 6 of [facility] and placed in supine position upon the operating table.  Under Dr. Burnett Corrente direct supervision, general endotracheal anesthesia was induced followed by careful position in the beach-chair  position with aid of a torso and head holder designed for shoulder arthroscopy.  The entire right upper extremity and forequarter were prepped with DuraPrep and draped with impervious arthroscopy drapes.  Procedure commenced with a routine surgical time-out.  We confirmed that 1 g Ancef was administered as an IV prophylactic antibiotic.  The arthroscope was introduced through a standard posterior viewing portal with switching stick technique.  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  There was degenerative change in the labrum from 1 o'clock anteriorly to 6 o'clock.  An anterior portal was created under direct vision, allowing debridement with a 4.2 mm suction shaver.  The subscapularis had a grade 3 superior tear with a retracted tendon that was rather contracted.  The biceps was stable in a screw.  The full-thickness rotator cuff tear was identified and documented with a digital camera.  We created an anterior superior lateral portal and debrided the lesser tuberosity and the subscapularis.  We then placed a clear cannula that allowed initial placement of a FiberTape with a Scorpion suture passer. We then proceeded to repair the subscapularis to decorticate the lesser tuberosity rebuilding the medial wall for the biceps tendon, however, our purchase was inadequate to obtain a firm repair, therefore, we re- grouped, replaced the portal anteriorly, and used a 45-degree suture Lasso to place a very excellent FiberTape suture through the superior subscapularis through a very stout tissue.  We then placed a 4.75-mm swivel lock at the lesser tuberosity creating anatomic repair of the subscapularis to decorticated bone.  Photographic documentation repair was accomplished, followed by further debridement of synovitis and removal of the arthroscopic equipment from the glenohumeral joint.  The scope was then placed in subacromial space.   After bursectomy and release of a portion of coracoacromial ligament, the acromion was leveled to a type 1 morphology.  The capsule of the [facility] joint was taken down with a cutting cautery, and the arthritic distal clavicle documented with a digital camera.  The distal centimeter of clavicle was removed arthroscopically with hemostasis with bipolar cautery.  We documented the rotator cuff tear with a digital camera, followed by further bursectomy and hemostasis. We then removed the arthroscopic equipment from the subacromial space and performed a 3-cm muscle-splitting incision through the anterior superior lateral portal.  The bursa was excised followed by freshening the margins of the very necrotic and complex H shaped retracted supraspinatus-infraspinatus rotator cuff tear.  We then placed two 4.75 swivel locks with FiberTape tails at the central aspect of the infraspinatus footprint and the supraspinatus footprint, followed by placement of lateral mattress sutures, closing the medial limbs of the "H" shaped tear followed by an Arthrex Speed Bridge technique to repair the cuff to an anatomic footprint.  An anterior dog ear was treated with the central traction stitch from the anterior swivel lock placed laterally to secure the SpeedBridge.  A very low profile repair was achieved with excellent footprint.  The lateral acromion was then tapered with a hand rasp followed by hemostasis.  After thorough irrigation of the subacromial space, the deltoid split was repaired with figure-of-eight sutures of 0 Vicryl, followed by repair of the skin with 0 Vicryl, 3-0 Vicryl, and intradermal 3-0 Prolene.  For aftercare, David [name] is provided  prescriptions for Dilaudid 2 mg 1-2 tablets p.o. q.4-6 h. p.r.n. pain, 30 tablets, also Keflex 500 mg 1 p.o. q.8 hours x3 days as prophylactic antibiotic.  We will see him back for followup in our office in 24-48 hours, depending on his choice whether or not to  stay in recovery care overnight.  Questions regarding his surgery will be discussed in detail in the first postoperative visit likely in Recovery Care.     Katy Fitch Ayako Tapanes, M.D.     RVS/MEDQ  D:  08/06/2011  T:  08/07/2011  Job:  [id number]

## 2011-08-07 NOTE — H&P (Signed)
H&P documentation: 08/06/11  -History and Physical Reviewed  -Patient has been re-examined  -No change in the plan of care  Wyn Forster, MD

## 2011-08-09 ENCOUNTER — Encounter (HOSPITAL_BASED_OUTPATIENT_CLINIC_OR_DEPARTMENT_OTHER): Payer: Self-pay | Admitting: Orthopedic Surgery

## 2012-02-09 ENCOUNTER — Ambulatory Visit: Payer: Medicare Other

## 2012-02-09 ENCOUNTER — Other Ambulatory Visit: Payer: Self-pay | Admitting: Physician Assistant

## 2012-02-09 ENCOUNTER — Ambulatory Visit (INDEPENDENT_AMBULATORY_CARE_PROVIDER_SITE_OTHER): Payer: Medicare Other | Admitting: Family Medicine

## 2012-02-09 VITALS — BP 113/72 | HR 56 | Temp 97.9°F | Resp 16 | Ht 68.0 in | Wt 164.0 lb

## 2012-02-09 DIAGNOSIS — M79673 Pain in unspecified foot: Secondary | ICD-10-CM

## 2012-02-09 DIAGNOSIS — S96912A Strain of unspecified muscle and tendon at ankle and foot level, left foot, initial encounter: Secondary | ICD-10-CM

## 2012-02-09 DIAGNOSIS — M79609 Pain in unspecified limb: Secondary | ICD-10-CM

## 2012-02-09 DIAGNOSIS — S93409A Sprain of unspecified ligament of unspecified ankle, initial encounter: Secondary | ICD-10-CM

## 2012-02-09 NOTE — Patient Instructions (Signed)
Continue icing several times a day for the next few days.  Reduce exercise until is feeling a lot better.  Recommend wearing the ankle brace to reduce motion in the ankle.  Ibuprofen 800 mg 3 times daily

## 2012-02-09 NOTE — Progress Notes (Signed)
  Subjective:    Patient ID: David [name], male    DOB: [birth year], 66 y.o.   MRN: [id number]  HPI patient was in his driveway and turn around and say goodbye to the person who had given him a ride and when he turned he stepped in a hole, twisting his left foot. This happened yesterday. These are hard along the lateral aspect of the left foot. No problems prior to this injury    Review of Systems     Objective:   Physical Exam Pedal pulses adequate. Can move his toe and not painful in the toes. Very tender along both proximal one half of the fifth metatarsal. No obvious ecchymosis or swelling. Ankle is okay.   [facility] reading (PRIMARY) by  Dr. Alwyn Ren No fracture or abnormality noted.       Assessment & Plan:  Foot pain and strain  See instructions

## 2012-09-24 DIAGNOSIS — M1990 Unspecified osteoarthritis, unspecified site: Secondary | ICD-10-CM

## 2012-09-24 DIAGNOSIS — J45909 Unspecified asthma, uncomplicated: Secondary | ICD-10-CM | POA: Insufficient documentation

## 2012-09-24 HISTORY — DX: Unspecified asthma, uncomplicated: J45.909

## 2012-09-24 HISTORY — DX: Unspecified osteoarthritis, unspecified site: M19.90

## 2012-10-05 ENCOUNTER — Ambulatory Visit (INDEPENDENT_AMBULATORY_CARE_PROVIDER_SITE_OTHER): Payer: Self-pay | Admitting: Surgery

## 2012-10-09 ENCOUNTER — Encounter (INDEPENDENT_AMBULATORY_CARE_PROVIDER_SITE_OTHER): Payer: Self-pay | Admitting: Surgery

## 2012-10-09 ENCOUNTER — Ambulatory Visit (INDEPENDENT_AMBULATORY_CARE_PROVIDER_SITE_OTHER): Payer: Medicare Other | Admitting: Surgery

## 2012-10-09 VITALS — BP 102/68 | HR 62 | Temp 97.8°F | Resp 18 | Ht 69.0 in | Wt 166.6 lb

## 2012-10-09 DIAGNOSIS — K4020 Bilateral inguinal hernia, without obstruction or gangrene, not specified as recurrent: Secondary | ICD-10-CM

## 2012-10-09 NOTE — Patient Instructions (Signed)
Hernia A hernia occurs when an internal organ pushes out through a weak spot in the abdominal wall. Hernias most commonly occur in the groin and around the navel. Hernias often can be pushed back into place (reduced). Most hernias tend to get worse over time. Some abdominal hernias can get stuck in the opening (irreducible or incarcerated hernia) and cannot be reduced. An irreducible abdominal hernia which is tightly squeezed into the opening is at risk for impaired blood supply (strangulated hernia). A strangulated hernia is a medical emergency. Because of the risk for an irreducible or strangulated hernia, surgery may be recommended to repair a hernia. CAUSES   Heavy lifting.  Prolonged coughing.  Straining to have a bowel movement.  A cut (incision) made during an abdominal surgery. HOME CARE INSTRUCTIONS   Bed rest is not required. You may continue your normal activities.  Avoid lifting more than 10 pounds (4.5 kg) or straining.  Cough gently. If you are a smoker it is best to stop. Even the best hernia repair can break down with the continual strain of coughing. Even if you do not have your hernia repaired, a cough will continue to aggravate the problem.  Do not wear anything tight over your hernia. Do not try to keep it in with an outside bandage or truss. These can damage abdominal contents if they are trapped within the hernia sac.  Eat a normal diet.  Avoid constipation. Straining over long periods of time will increase hernia size and encourage breakdown of repairs. If you cannot do this with diet alone, stool softeners may be used. SEEK IMMEDIATE MEDICAL CARE IF:   You have a fever.  You develop increasing abdominal pain.  You feel nauseous or vomit.  Your hernia is stuck outside the abdomen, looks discolored, feels hard, or is tender.  You have any changes in your bowel habits or in the hernia that are unusual for you.  You have increased pain or swelling around the  hernia.  You cannot push the hernia back in place by applying gentle pressure while lying down. MAKE SURE YOU:   Understand these instructions.  Will watch your condition.  Will get help right away if you are not doing well or get worse. Document Released: 08/19/2005 Document Revised: 11/11/2011 Document Reviewed: 04/07/2008 ExitCare Patient Information 2013 [employer].  

## 2012-10-09 NOTE — Progress Notes (Signed)
Patient ID: David [name], male   DOB: 1946-03-19, 67 y.o.   MRN: [id number]  Chief Complaint  Patient presents with  . New Evaluation    eval RIH    HPI David [name] is a 67 y.o. male.  Patient sent at request of Dr  David [name] 4 right inguinal hernia. This was picked up on physical exam during recent physical. He has no complaints of pain, bulge, or limitation to activity. HPI  Past Medical History  Diagnosis Date  . Asthma     exercise-induced; prn inhaler  . Rotator cuff tear, right     impingement, AC arthrosis    Past Surgical History  Procedure Date  . Lumbar laminectomy age 40s  . Shoulder arthroscopy 08/06/2011    Procedure: ARTHROSCOPY SHOULDER;  Surgeon: Wyn Forster., MD;  Location: [facility];  Service: Orthopedics;  Laterality: Right;  with subacromial decompression, arthroscopic subscapularis repair, and rotator cuff repair    History reviewed. No pertinent family history.  Social History History  Substance Use Topics  . Smoking status: Never Smoker   . Smokeless tobacco: Never Used  . Alcohol Use: Yes     Comment: occ. beer    Allergies  Allergen Reactions  . Codeine Itching  . Hydrocodone Itching    Current Outpatient Prescriptions  Medication Sig Dispense Refill  . aspirin 81 MG tablet Take 81 mg by mouth daily.        . budesonide-formoterol (SYMBICORT) 160-4.5 MCG/ACT inhaler Inhale 2 puffs into the lungs 2 (two) times daily.        . multivitamin (THERAGRAN) per tablet Take 1 tablet by mouth daily.          Review of Systems Review of Systems  Constitutional: Negative for fever, chills and unexpected weight change.  HENT: Negative for hearing loss, congestion, sore throat, trouble swallowing and voice change.   Eyes: Negative for visual disturbance.  Respiratory: Negative for cough and wheezing.   Cardiovascular: Negative for chest pain, palpitations and leg swelling.  Gastrointestinal: Negative for nausea,  vomiting, abdominal pain, diarrhea, constipation, blood in stool, abdominal distention, anal bleeding and rectal pain.  Genitourinary: Negative for hematuria and difficulty urinating.  Musculoskeletal: Negative for arthralgias.  Skin: Negative for rash and wound.  Neurological: Negative for seizures, syncope, weakness and headaches.  Hematological: Negative for adenopathy. Does not bruise/bleed easily.  Psychiatric/Behavioral: Negative for confusion.    Blood pressure 102/68, pulse 62, temperature 97.8 F (36.6 C), temperature source Temporal, resp. rate 18, height 5\' 9"  (1.753 m), weight 166 lb 9.6 oz (75.569 kg).  Physical Exam Physical Exam  Constitutional: He is oriented to person, place, and time. He appears well-developed and well-nourished.  HENT:  Head: Normocephalic and atraumatic.  Eyes: EOM are normal. Pupils are equal, round, and reactive to light.  Neck: Normal range of motion.  Cardiovascular: Normal rate and regular rhythm.   Pulmonary/Chest: Effort normal and breath sounds normal.  Abdominal: Soft. Bowel sounds are normal. A hernia is present. Hernia confirmed positive in the right inguinal area and confirmed positive in the left inguinal area.    Musculoskeletal: Normal range of motion.  Neurological: He is alert and oriented to person, place, and time.  Skin: Skin is warm and dry.  Psychiatric: He has a normal mood and affect. His behavior is normal. Judgment and thought content normal.      Assessment    Bilateral inguinal hernia reducible asymptomatic and small  Plan    Discussed operative repair versus observation. These areas are quite small and asymptomatic and not impacting his quality of life. Recommend observation for now. If they become symptomatic or larger laparoscopic repair can be attempted. Information about hernias given today and what problems to look.  Return as needed or if symptoms arise.       [name] A. 10/09/2012, 10:30  AM

## 2012-10-16 DIAGNOSIS — K4020 Bilateral inguinal hernia, without obstruction or gangrene, not specified as recurrent: Secondary | ICD-10-CM

## 2012-10-16 HISTORY — DX: Bilateral inguinal hernia, without obstruction or gangrene, not specified as recurrent: K40.20

## 2014-01-05 ENCOUNTER — Ambulatory Visit (INDEPENDENT_AMBULATORY_CARE_PROVIDER_SITE_OTHER): Payer: Medicare Other | Admitting: Emergency Medicine

## 2014-01-05 VITALS — BP 106/64 | HR 66 | Temp 98.2°F | Resp 16 | Ht 66.5 in | Wt 164.8 lb

## 2014-01-05 DIAGNOSIS — J45901 Unspecified asthma with (acute) exacerbation: Secondary | ICD-10-CM

## 2014-01-05 DIAGNOSIS — J018 Other acute sinusitis: Secondary | ICD-10-CM

## 2014-01-05 DIAGNOSIS — J209 Acute bronchitis, unspecified: Secondary | ICD-10-CM

## 2014-01-05 MED ORDER — ALBUTEROL SULFATE HFA 108 (90 BASE) MCG/ACT IN AERS: 2.0000 | INHALATION_SPRAY | RESPIRATORY_TRACT | Status: DC | PRN

## 2014-01-05 MED ORDER — AMOXICILLIN-POT CLAVULANATE 875-125 MG PO TABS: 1.0000 | ORAL_TABLET | Freq: Two times a day (BID) | ORAL | Status: DC

## 2014-01-05 MED ORDER — IPRATROPIUM BROMIDE 0.02 % IN SOLN
0.5000 mg | Freq: Once | RESPIRATORY_TRACT | Status: AC
  Administered 2014-01-05: 0.5 mg via RESPIRATORY_TRACT

## 2014-01-05 MED ORDER — PSEUDOEPHEDRINE-GUAIFENESIN ER 60-600 MG PO TB12: 1.0000 | ORAL_TABLET | Freq: Two times a day (BID) | ORAL | Status: DC

## 2014-01-05 MED ORDER — ALBUTEROL SULFATE (2.5 MG/3ML) 0.083% IN NEBU
5.0000 mg | INHALATION_SOLUTION | Freq: Once | RESPIRATORY_TRACT | Status: AC
  Administered 2014-01-05: 5 mg via RESPIRATORY_TRACT

## 2014-01-05 NOTE — Patient Instructions (Signed)
Asthma, Acute Bronchospasm °Acute bronchospasm caused by asthma is also referred to as an asthma attack. Bronchospasm means your air passages become narrowed. The narrowing is caused by inflammation and tightening of the muscles in the air tubes (bronchi) in your lungs. This can make it hard to breath or cause you to wheeze and cough. °CAUSES °Possible triggers are: °· Animal dander from the skin, hair, or feathers of animals. °· Dust mites contained in house dust. °· Cockroaches. °· Pollen from trees or grass. °· Mold. °· Cigarette or tobacco smoke. °· Air pollutants such as dust, household cleaners, hair sprays, aerosol sprays, paint fumes, strong chemicals, or strong odors. °· Cold air or weather changes. Cold air may trigger inflammation. Winds increase molds and pollens in the air. °· Strong emotions such as crying or laughing hard. °· Stress. °· Certain medicines such as aspirin or beta-blockers. °· Sulfites in foods and drinks, such as dried fruits and wine. °· Infections or inflammatory conditions, such as a flu, cold, or inflammation of the nasal membranes (rhinitis). °· Gastroesophageal reflux disease (GERD). GERD is a condition where stomach acid backs up into your throat (esophagus). °· Exercise or strenuous activity. °SIGNS AND SYMPTOMS  °· Wheezing. °· Excessive coughing, particularly at night. °· Chest tightness. °· Shortness of breath. °DIAGNOSIS  °Your health care provider will ask you about your medical history and perform a physical exam. A chest X-ray or blood testing may be performed to look for other causes of your symptoms or other conditions that may have triggered your asthma attack.  °TREATMENT  °Treatment is aimed at reducing inflammation and opening up the airways in your lungs.  Most asthma attacks are treated with inhaled medicines. These include quick relief or rescue medicines (such as bronchodilators) and controller medicines (such as inhaled corticosteroids). These medicines are  sometimes given through an inhaler or a nebulizer. Systemic steroid medicine taken by mouth or given through an IV tube also can be used to reduce the inflammation when an attack is moderate or severe. Antibiotic medicines are only used if a bacterial infection is present.  °HOME CARE INSTRUCTIONS  °· Rest. °· Drink plenty of liquids. This helps the mucus to remain thin and be easily coughed up. Only use caffeine in moderation and do not use alcohol until you have recovered from your illness. °· Do not smoke. Avoid being exposed to secondhand smoke. °· You play a critical role in keeping yourself in good health. Avoid exposure to things that cause you to wheeze or to have breathing problems. °· Keep your medicines up to date and available. Carefully follow your health care provider's treatment plan. °· Take your medicine exactly as prescribed. °· When pollen or pollution is bad, keep windows closed and use an air conditioner or go to places with air conditioning. °· Asthma requires careful medical care. See your health care provider for a follow-up as advised. If you are more than [redacted] weeks pregnant and you were prescribed any new medicines, let your obstetrician know about the visit and how you are doing. Follow-up with your health care provider as directed. °· After you have recovered from your asthma attack, make an appointment with your outpatient doctor to talk about ways to reduce the likelihood of future attacks. If you do not have a doctor who manages your asthma, make an appointment with a primary care doctor to discuss your asthma. °SEEK IMMEDIATE MEDICAL CARE IF:  °· You are getting worse. °· You have trouble breathing. If severe, call   your local emergency services (911 in the [place] °· You develop chest pain or discomfort. °· You are vomiting. °· You are not able to keep fluids down. °· You are coughing up yellow, green, brown, or bloody sputum. °· You have a fever and your symptoms suddenly get  worse. °· You have trouble swallowing. °MAKE SURE YOU:  °· Understand these instructions. °· Will watch your condition. °· Will get help right away if you are not doing well or get worse. °Document Released: 12/04/2006 Document Revised: 04/21/2013 Document Reviewed: 02/24/2013 °ExitCare® Patient Information ©2014 [employer]. ° °

## 2014-01-05 NOTE — Progress Notes (Signed)
[facility] [address] [place] [address] [phone]  Date:  01/05/2014   Name:  David [name]   DOB:  Sep 27, 1945   MRN:  [id number]  PCP:  Wayland Salinas, MD    Chief Complaint: Cough   History of Present Illness:  David [name] is a 68 y.o. very pleasant male patient who presents with the following:  Ill for two days mucopurulent nasal drainage and post nasal drip.  Has a productive cough. Has a sore throat.  Wheezing and exertional shortness of breath.  No nausea or vomiting.  No fever or chills.  History of reactive airway disease.  Not using inhalers as ordered.  No improvement with over the counter medications or other home remedies. Denies other complaint or health concern today.   There are no active problems to display for this patient.   Past Medical History  Diagnosis Date  . Asthma     exercise-induced; prn inhaler  . Rotator cuff tear, right     impingement, AC arthrosis    Past Surgical History  Procedure Laterality Date  . Lumbar laminectomy  age 55s  . Shoulder arthroscopy  08/06/2011    Procedure: ARTHROSCOPY SHOULDER;  Surgeon: Cammie Sickle., MD;  Location: [facility];  Service: Orthopedics;  Laterality: Right;  with subacromial decompression, arthroscopic subscapularis repair, and rotator cuff repair    History  Substance Use Topics  . Smoking status: Never Smoker   . Smokeless tobacco: Never Used  . Alcohol Use: Yes     Comment: occ. beer    No family history on file.  Allergies  Allergen Reactions  . Codeine Itching  . Hydrocodone Itching    Medication list has been reviewed and updated.  Current Outpatient Prescriptions on File Prior to Visit  Medication Sig Dispense Refill  . aspirin 81 MG tablet Take 81 mg by mouth daily.        . budesonide-formoterol (SYMBICORT) 160-4.5 MCG/ACT inhaler Inhale 2 puffs into the lungs 2 (two) times daily.        . multivitamin (THERAGRAN) per tablet  Take 1 tablet by mouth daily.         No current facility-administered medications on file prior to visit.    Review of Systems:  As per HPI, otherwise negative.    Physical Examination: Filed Vitals:   01/05/14 1425  BP: 106/64  Pulse: 66  Temp: 98.2 F (36.8 C)  Resp: 16   Filed Vitals:   01/05/14 1425  Height: 5' 6.5" (1.689 m)  Weight: 164 lb 12.8 oz (74.753 kg)   Body mass index is 26.2 kg/(m^2). Ideal Body Weight: Weight in (lb) to have BMI = 25: 156.9  GEN: WDWN, NAD, Non-toxic, A & O x 3 HEENT: Atraumatic, Normocephalic. Neck supple. No masses, No LAD. Ears and Nose: No external deformity. CV: RRR, No M/G/R. No JVD. No thrill. No extra heart sounds. PULM: diffuse wheezes, no crackles, rhonchi. No retractions. No resp. distress. No accessory muscle use. ABD: S, NT, ND, +BS. No rebound. No HSM. EXTR: No c/c/e NEURO Normal gait.  PSYCH: Normally interactive. Conversant. Not depressed or anxious appearing.  Calm demeanor.    Assessment and Plan: Sinusitis Exacerbation asthma Use symbicort as directed Use albuterol MDI augmentin mucinex d  Signed,  Ellison Carwin, MD

## 2014-06-12 ENCOUNTER — Emergency Department (HOSPITAL_COMMUNITY)
Admission: EM | Admit: 2014-06-12 | Discharge: 2014-06-12 | Discharge status: Against Medical Advice | Payer: Medicare Other | Attending: Emergency Medicine | Admitting: Emergency Medicine

## 2014-06-12 ENCOUNTER — Encounter (HOSPITAL_COMMUNITY): Payer: Self-pay | Admitting: Emergency Medicine

## 2014-06-12 DIAGNOSIS — J45909 Unspecified asthma, uncomplicated: Secondary | ICD-10-CM | POA: Insufficient documentation

## 2014-06-12 DIAGNOSIS — R200 Anesthesia of skin: Secondary | ICD-10-CM | POA: Diagnosis present

## 2014-06-12 NOTE — ED Notes (Signed)
Pt arrived to the ED with a complaint of right hand numbness.  Pt was swinging a pick axe earlier on Saturday morning.  Pt began having right hand numbness and pain later Saturday night.  Pt has been unable to relieve pain or numbness. Pt has taken some pain medications without effect.

## 2014-06-17 ENCOUNTER — Other Ambulatory Visit: Payer: Self-pay | Admitting: Orthopedic Surgery

## 2014-06-27 ENCOUNTER — Encounter (HOSPITAL_BASED_OUTPATIENT_CLINIC_OR_DEPARTMENT_OTHER): Payer: Self-pay | Admitting: *Deleted

## 2014-06-30 ENCOUNTER — Encounter (HOSPITAL_BASED_OUTPATIENT_CLINIC_OR_DEPARTMENT_OTHER): Payer: Self-pay | Admitting: Certified Registered"

## 2014-06-30 ENCOUNTER — Ambulatory Visit (HOSPITAL_BASED_OUTPATIENT_CLINIC_OR_DEPARTMENT_OTHER): Payer: Medicare Other | Admitting: Certified Registered"

## 2014-06-30 ENCOUNTER — Encounter (HOSPITAL_BASED_OUTPATIENT_CLINIC_OR_DEPARTMENT_OTHER)
Admission: RE | Disposition: A | Discharge status: Home / Self Care | Payer: Self-pay | Source: Ambulatory Visit | Attending: Orthopedic Surgery

## 2014-06-30 ENCOUNTER — Ambulatory Visit (HOSPITAL_BASED_OUTPATIENT_CLINIC_OR_DEPARTMENT_OTHER)
Admission: RE | Admit: 2014-06-30 | Discharge: 2014-06-30 | Disposition: A | Discharge status: Home / Self Care | Payer: Medicare Other | Source: Ambulatory Visit | Attending: Orthopedic Surgery | Admitting: Orthopedic Surgery

## 2014-06-30 ENCOUNTER — Encounter (HOSPITAL_BASED_OUTPATIENT_CLINIC_OR_DEPARTMENT_OTHER): Payer: Medicare Other | Admitting: Certified Registered"

## 2014-06-30 DIAGNOSIS — M65311 Trigger thumb, right thumb: Secondary | ICD-10-CM | POA: Diagnosis not present

## 2014-06-30 DIAGNOSIS — Z7982 Long term (current) use of aspirin: Secondary | ICD-10-CM | POA: Insufficient documentation

## 2014-06-30 DIAGNOSIS — J45909 Unspecified asthma, uncomplicated: Secondary | ICD-10-CM | POA: Insufficient documentation

## 2014-06-30 DIAGNOSIS — Z79899 Other long term (current) drug therapy: Secondary | ICD-10-CM | POA: Diagnosis not present

## 2014-06-30 DIAGNOSIS — Z885 Allergy status to narcotic agent status: Secondary | ICD-10-CM | POA: Diagnosis not present

## 2014-06-30 HISTORY — PX: TRIGGER FINGER RELEASE: SHX641

## 2014-06-30 LAB — POCT HEMOGLOBIN-HEMACUE: Hemoglobin: 15.1 g/dL (ref 13.0–17.0)

## 2014-06-30 SURGERY — RELEASE, A1 PULLEY, FOR TRIGGER FINGER
Anesthesia: Regional | Site: Finger | Laterality: Right

## 2014-06-30 MED ORDER — TRAMADOL HCL 50 MG PO TABS: 50.0000 mg | ORAL_TABLET | Freq: Four times a day (QID) | ORAL | Status: DC | PRN

## 2014-06-30 MED ORDER — HYDROMORPHONE HCL 1 MG/ML IJ SOLN: 0.2500 mg | INTRAMUSCULAR | Status: DC | PRN

## 2014-06-30 MED ORDER — BUPIVACAINE HCL (PF) 0.25 % IJ SOLN
INTRAMUSCULAR | Status: AC
  Filled 2014-06-30: qty 30

## 2014-06-30 MED ORDER — CEFAZOLIN SODIUM-DEXTROSE 2-3 GM-% IV SOLR
2.0000 g | INTRAVENOUS | Status: AC
Start: 2014-06-30 — End: 2014-06-30
  Administered 2014-06-30: 2 g via INTRAVENOUS

## 2014-06-30 MED ORDER — OXYCODONE HCL 5 MG/5ML PO SOLN: 5.0000 mg | Freq: Once | ORAL | Status: DC | PRN

## 2014-06-30 MED ORDER — OXYCODONE HCL 5 MG PO TABS: 5.0000 mg | ORAL_TABLET | Freq: Once | ORAL | Status: DC | PRN

## 2014-06-30 MED ORDER — ONDANSETRON HCL 4 MG/2ML IJ SOLN: 4.0000 mg | Freq: Once | INTRAMUSCULAR | Status: DC | PRN

## 2014-06-30 MED ORDER — PROPOFOL INFUSION 10 MG/ML OPTIME
INTRAVENOUS | Status: DC | PRN
  Administered 2014-06-30: 50 ug/kg/min via INTRAVENOUS

## 2014-06-30 MED ORDER — MEPERIDINE HCL 25 MG/ML IJ SOLN: 6.2500 mg | INTRAMUSCULAR | Status: DC | PRN

## 2014-06-30 MED ORDER — FENTANYL CITRATE 0.05 MG/ML IJ SOLN
INTRAMUSCULAR | Status: AC
  Filled 2014-06-30: qty 4

## 2014-06-30 MED ORDER — MIDAZOLAM HCL 2 MG/2ML IJ SOLN
INTRAMUSCULAR | Status: AC
  Filled 2014-06-30: qty 2

## 2014-06-30 MED ORDER — FENTANYL CITRATE 0.05 MG/ML IJ SOLN: 50.0000 ug | INTRAMUSCULAR | Status: DC | PRN

## 2014-06-30 MED ORDER — LIDOCAINE HCL (PF) 0.5 % IJ SOLN
INTRAMUSCULAR | Status: DC | PRN
Start: 2014-06-30 — End: 2014-06-30
  Administered 2014-06-30: 30 mL via INTRAVENOUS

## 2014-06-30 MED ORDER — MIDAZOLAM HCL 2 MG/2ML IJ SOLN: 1.0000 mg | INTRAMUSCULAR | Status: DC | PRN

## 2014-06-30 MED ORDER — LACTATED RINGERS IV SOLN
INTRAVENOUS | Status: DC
  Administered 2014-06-30: 11:00:00 via INTRAVENOUS

## 2014-06-30 MED ORDER — PROPOFOL 10 MG/ML IV EMUL
INTRAVENOUS | Status: AC
  Filled 2014-06-30: qty 50

## 2014-06-30 MED ORDER — ONDANSETRON HCL 4 MG/2ML IJ SOLN
INTRAMUSCULAR | Status: DC | PRN
  Administered 2014-06-30: 4 mg via INTRAVENOUS

## 2014-06-30 MED ORDER — LIDOCAINE HCL (CARDIAC) 20 MG/ML IV SOLN
INTRAVENOUS | Status: DC | PRN
  Administered 2014-06-30: 30 mg via INTRAVENOUS

## 2014-06-30 MED ORDER — CEFAZOLIN SODIUM-DEXTROSE 2-3 GM-% IV SOLR: 2.0000 g | INTRAVENOUS | Status: DC

## 2014-06-30 MED ORDER — FENTANYL CITRATE 0.05 MG/ML IJ SOLN
INTRAMUSCULAR | Status: DC | PRN
  Administered 2014-06-30: 50 ug via INTRAVENOUS

## 2014-06-30 MED ORDER — CEFAZOLIN SODIUM-DEXTROSE 2-3 GM-% IV SOLR
INTRAVENOUS | Status: AC
  Filled 2014-06-30: qty 50

## 2014-06-30 MED ORDER — BUPIVACAINE HCL (PF) 0.25 % IJ SOLN
INTRAMUSCULAR | Status: DC | PRN
  Administered 2014-06-30: 3 mL

## 2014-06-30 MED ORDER — CHLORHEXIDINE GLUCONATE 4 % EX LIQD: 60.0000 mL | Freq: Once | CUTANEOUS | Status: DC

## 2014-06-30 MED ORDER — MIDAZOLAM HCL 5 MG/5ML IJ SOLN
INTRAMUSCULAR | Status: DC | PRN
  Administered 2014-06-30: 2 mg via INTRAVENOUS

## 2014-06-30 SURGICAL SUPPLY — 35 items
BANDAGE COBAN STERILE 2 (GAUZE/BANDAGES/DRESSINGS) ×2 IMPLANT
BLADE SURG 15 STRL LF DISP TIS (BLADE) ×1 IMPLANT
BLADE SURG 15 STRL SS (BLADE) ×2
BNDG CMPR 9X4 STRL LF SNTH (GAUZE/BANDAGES/DRESSINGS)
BNDG ESMARK 4X9 LF (GAUZE/BANDAGES/DRESSINGS) IMPLANT
CHLORAPREP W/TINT 26ML (MISCELLANEOUS) ×2 IMPLANT
CORDS BIPOLAR (ELECTRODE) IMPLANT
COVER BACK TABLE 60X90IN (DRAPES) ×2 IMPLANT
COVER MAYO STAND STRL (DRAPES) ×2 IMPLANT
CUFF TOURNIQUET SINGLE 18IN (TOURNIQUET CUFF) ×1 IMPLANT
DECANTER SPIKE VIAL GLASS SM (MISCELLANEOUS) IMPLANT
DRAPE EXTREMITY TIBURON (DRAPES) ×2 IMPLANT
DRAPE SURG 17X23 STRL (DRAPES) ×2 IMPLANT
GAUZE SPONGE 4X4 12PLY STRL (GAUZE/BANDAGES/DRESSINGS) ×2 IMPLANT
GAUZE XEROFORM 1X8 LF (GAUZE/BANDAGES/DRESSINGS) ×2 IMPLANT
GLOVE BIOGEL M 7.0 STRL (GLOVE) ×2 IMPLANT
GLOVE BIOGEL PI IND STRL 8.5 (GLOVE) ×1 IMPLANT
GLOVE BIOGEL PI INDICATOR 8.5 (GLOVE) ×1
GLOVE ECLIPSE 6.5 STRL STRAW (GLOVE) ×1 IMPLANT
GLOVE EXAM NITRILE EXT CUFF MD (GLOVE) ×2 IMPLANT
GLOVE SURG ORTHO 8.0 STRL STRW (GLOVE) ×2 IMPLANT
GOWN STRL REUS W/ TWL LRG LVL3 (GOWN DISPOSABLE) ×1 IMPLANT
GOWN STRL REUS W/TWL LRG LVL3 (GOWN DISPOSABLE) ×4
GOWN STRL REUS W/TWL XL LVL3 (GOWN DISPOSABLE) ×2 IMPLANT
NEEDLE 27GAX1X1/2 (NEEDLE) ×2 IMPLANT
NS IRRIG 1000ML POUR BTL (IV SOLUTION) ×2 IMPLANT
PACK BASIN DAY SURGERY FS (CUSTOM PROCEDURE TRAY) ×2 IMPLANT
PADDING CAST ABS 4INX4YD NS (CAST SUPPLIES) ×1
PADDING CAST ABS COTTON 4X4 ST (CAST SUPPLIES) ×1 IMPLANT
STOCKINETTE 4X48 STRL (DRAPES) ×2 IMPLANT
SUT VICRYL RAPIDE 4/0 PS 2 (SUTURE) ×2 IMPLANT
SYR BULB 3OZ (MISCELLANEOUS) ×2 IMPLANT
SYR CONTROL 10ML LL (SYRINGE) ×2 IMPLANT
TOWEL OR 17X24 6PK STRL BLUE (TOWEL DISPOSABLE) ×4 IMPLANT
UNDERPAD 30X30 INCONTINENT (UNDERPADS AND DIAPERS) ×2 IMPLANT

## 2014-06-30 NOTE — Anesthesia Procedure Notes (Addendum)
Anesthesia Regional Block:  Bier block (IV Regional)  Pre-Anesthetic Checklist: ,, timeout performed, Correct Patient, Correct Site, Correct Laterality, Correct Procedure,, site marked, surgical consent,, at surgeon's request Needles:  Injection technique: Single-shot  Needle Type: Other      Needle Gauge: 20 and 20 G    Additional Needles: Bier block (IV Regional) Narrative:   Performed by: Personally    Procedure Name: MAC Date/Time: 06/30/2014 11:45 AM Performed by: Baxter Flattery Pre-anesthesia Checklist: Patient identified, Emergency Drugs available, Suction available, Patient being monitored and Timeout performed Patient Re-evaluated:Patient Re-evaluated prior to inductionOxygen Delivery Method: Simple face mask

## 2014-06-30 NOTE — Anesthesia Preprocedure Evaluation (Signed)
Anesthesia Evaluation  Patient identified by MRN, date of birth, ID band Patient awake    Reviewed: Allergy & Precautions, H&P , NPO status , Patient's Chart, lab work & pertinent test results  Airway Mallampati: I  TM Distance: >3 FB Neck ROM: Full    Dental   Pulmonary          Cardiovascular     Neuro/Psych    GI/Hepatic   Endo/Other    Renal/GU      Musculoskeletal   Abdominal   Peds  Hematology   Anesthesia Other Findings   Reproductive/Obstetrics                             Anesthesia Physical Anesthesia Plan  ASA: II  Anesthesia Plan: Bier Block   Post-op Pain Management:    Induction: Intravenous  Airway Management Planned: Natural Airway  Additional Equipment:   Intra-op Plan:   Post-operative Plan:   Informed Consent: I have reviewed the patients History and Physical, chart, labs and discussed the procedure including the risks, benefits and alternatives for the proposed anesthesia with the patient or authorized representative who has indicated his/her understanding and acceptance.     Plan Discussed with: CRNA and Surgeon  Anesthesia Plan Comments:         Anesthesia Quick Evaluation

## 2014-06-30 NOTE — Discharge Instructions (Addendum)
[facility] Instructions °Hand Surgery ° °Wound Care: °Keep your hand elevated above the level of your heart.  Do not allow it to dangle by your side.  Keep the dressing dry and do not remove it unless your doctor advises you to do so.  He will usually change it at the time of your post-op visit.  Moving your fingers is advised to stimulate circulation but will depend on the site of your surgery.  If you have a splint applied, your doctor will advise you regarding movement. ° °Activity: °Do not drive or operate machinery today.  Rest today and then you may return to your normal activity and work as indicated by your physician. ° °Diet:  °Drink liquids today or eat a light diet.  You may resume a regular diet tomorrow.   ° °General expectations: °Pain for two to three days. °Fingers may become slightly swollen. ° °Call your doctor if any of the following occur: °Severe pain not relieved by pain medication. °Elevated temperature. °Dressing soaked with blood. °Inability to move fingers. °White or bluish color to fingers. ° ° ° °Post Anesthesia Home Care Instructions ° °Activity: °Get plenty of rest for the remainder of the day. A responsible adult should stay with you for 24 hours following the procedure.  °For the next 24 hours, DO NOT: °-Drive a car °-Operate machinery °-Drink alcoholic beverages °-Take any medication unless instructed by your physician °-Make any legal decisions or sign important papers. ° °Meals: °Start with liquid foods such as gelatin or soup. Progress to regular foods as tolerated. Avoid greasy, spicy, heavy foods. If nausea and/or vomiting occur, drink only clear liquids until the nausea and/or vomiting subsides. Call your physician if vomiting continues. ° °Special Instructions/Symptoms: °Your throat may feel dry or sore from the anesthesia or the breathing tube placed in your throat during surgery. If this causes discomfort, gargle with warm salt water. The discomfort should disappear within  24 hours. ° °Call your surgeon if you experience:  ° °1.  Fever over 101.0. °2.  Inability to urinate. °3.  Nausea and/or vomiting. °4.  Extreme swelling or bruising at the surgical site. °5.  Continued bleeding from the incision. °6.  Increased pain, redness or drainage from the incision. °7.  Problems related to your pain medication. °8. Any change in color, movement and/or sensation °9. Any problems and/or concerns ° ° ° °

## 2014-06-30 NOTE — Anesthesia Postprocedure Evaluation (Signed)
Anesthesia Post Note  Patient: David [name]  Procedure(s) Performed: Procedure(s) (LRB): RELEASE A-1 PULLEY RIGHT THUMB (Right)  Anesthesia type: MAC  Patient location: PACU  Post pain: Pain level controlled  Post assessment: Patient's Cardiovascular Status Stable  Last Vitals:  Filed Vitals:   06/30/14 1245  BP: 114/83  Pulse: 57  Temp:   Resp: 12    Post vital signs: Reviewed and stable  Level of consciousness: sedated  Complications: No apparent anesthesia complications

## 2014-06-30 NOTE — Brief Op Note (Signed)
06/30/2014  12:17 PM  PATIENT:  David [name]  68 y.o. male  PRE-OPERATIVE DIAGNOSIS:  TRIGGER RIGHT THUMB  POST-OPERATIVE DIAGNOSIS:  TRIGGER RIGHT THUMB  PROCEDURE:  Procedure(s) with comments: RELEASE A-1 PULLEY RIGHT THUMB (Right) - ANESTHESIA:  IV REGIONAL FAB  SURGEON:  Surgeon(s) and Role:    * Daryll Brod, MD - Primary  PHYSICIAN ASSISTANT:   ASSISTANTS: none   ANESTHESIA:   local and regional  EBL:  Total I/O In: 500 [I.V.:500] Out: -   BLOOD ADMINISTERED:none  DRAINS: none   LOCAL MEDICATIONS USED:  BUPIVICAINE   SPECIMEN:  No Specimen  DISPOSITION OF SPECIMEN:  N/A  COUNTS:  YES  TOURNIQUET:   Total Tourniquet Time Documented: Forearm (Right) - 19 minutes Total: Forearm (Right) - 19 minutes   DICTATION: .Other Dictation: Dictation Number [id number]  PLAN OF CARE: Discharge to home after PACU  PATIENT DISPOSITION:  PACU - hemodynamically stable.

## 2014-06-30 NOTE — Op Note (Signed)
  Dictation Number [id number]

## 2014-06-30 NOTE — Transfer of Care (Signed)
Immediate Anesthesia Transfer of Care Note  Patient: David [name]  Procedure(s) Performed: Procedure(s) with comments: RELEASE A-1 PULLEY RIGHT THUMB (Right) - ANESTHESIA:  IV REGIONAL FAB  Patient Location: PACU  Anesthesia Type:Bier block  Level of Consciousness: awake, alert , oriented and patient cooperative  Airway & Oxygen Therapy: Patient Spontanous Breathing and Patient connected to face mask oxygen  Post-op Assessment: Report given to PACU RN and Post -op Vital signs reviewed and stable  Post vital signs: Reviewed and stable  Complications: No apparent anesthesia complications

## 2014-06-30 NOTE — H&P (Signed)
David [name] is a 68 year old right hand dominant male who has had catching of his right thumb. He has seen Dr. Daylene Katayama in the past for this and has had it injected on 2 occasions without relief. He states he was working recently when this became numb and he has had the recurrence of the catching of his right thumb IP joint. He has a prior history of injury to the IP joint. X-rays taken revealed he had mild degenerative changes. He states the numbness and tingling has resolved. 68 years old history of diabetes, thyroid problems, arthritis or gout.   PAST MEDICAL HISTORY:  He is allergic to Codeine. He is on Symbicort. He has had back surgery, elbow surgery, and shoulder surgery. FAMILY MEDICAL HISTORY: Negative. SOCIAL HISTORY:  He does not smoke. He drinks socially. He is not married. REVIEW OF SYSTEMS: Positive for asthma otherwise negative 14 points.  David [name] is an 68 y.o. male.   Chief Complaint: trigger right thumb HPI: see above  Past Medical History  Diagnosis Date  . Asthma     exercise-induced; prn inhaler  . Rotator cuff tear, right     impingement, AC arthrosis    Past Surgical History  Procedure Laterality Date  . Lumbar laminectomy  age 17s  . Shoulder arthroscopy  08/06/2011    Procedure: ARTHROSCOPY SHOULDER;  Surgeon: Cammie Sickle., MD;  Location: [facility];  Service: Orthopedics;  Laterality: Right;  with subacromial decompression, arthroscopic subscapularis repair, and rotator cuff repair    History reviewed. No pertinent family history. Social History:  reports that he has never smoked. He has never used smokeless tobacco. He reports that he drinks alcohol. He reports that he does not use illicit drugs.  Allergies:  Allergies  Allergen Reactions  . Codeine Itching  . Hydrocodone Itching    Medications Prior to Admission  Medication Sig Dispense Refill  . albuterol (PROVENTIL HFA;VENTOLIN HFA) 108 (90 BASE) MCG/ACT inhaler Inhale 2 puffs  into the lungs every 4 (four) hours as needed for wheezing or shortness of breath (cough, shortness of breath or wheezing.).  1 Inhaler  1  . aspirin 81 MG tablet Take 81 mg by mouth daily.        . budesonide-formoterol (SYMBICORT) 160-4.5 MCG/ACT inhaler Inhale 2 puffs into the lungs 2 (two) times daily.        . multivitamin (THERAGRAN) per tablet Take 1 tablet by mouth daily.          No results found for this or any previous visit (from the past 48 hour(s)).  No results found.   Pertinent items are noted in HPI.  Height 5\' 7"  (1.702 m), weight 72.576 kg (160 lb).  General appearance: alert, cooperative and appears stated age Head: Normocephalic, without obvious abnormality Neck: no JVD Resp: clear to auscultation bilaterally Cardio: regular rate and rhythm, S1, S2 normal, no murmur, click, rub or gallop GI: soft, non-tender; bowel sounds normal; no masses,  no organomegaly Extremities: trigger right thumb Pulses: 2+ and symmetric Skin: Skin color, texture, turgor normal. No rashes or lesions Neurologic: Grossly normal Incision/Wound: na  Assessment/Plan X-rays reviewed reveal he has mild degenerative changes. These x-rays are approximately43 years old.  Diagnosis: STS right thumb.  We have discussed the possibility of surgical intervention which he would like to have and will be done at his request. Pre, peri and post op care are discussed along with risks and complications. Patient is aware there is no guarantee  with surgery, possibility of infection, injury to arteries, nerves, and tendons, incomplete relief and dystrophy. He is scheduled for release A-1 pulley right thumb as an outpatient under regional anesthesia.  Rebie Peale R 06/30/2014, 10:39 AM

## 2014-06-30 NOTE — Op Note (Signed)
NAME:  David [name], David [name] NO.:  [id number]  MEDICAL RECORD NO.:  [id number]  LOCATION:                                 FACILITY:  PHYSICIAN:  Daryll Brod, M.D.       DATE OF BIRTH:  [birth year]  DATE OF PROCEDURE:  06/30/2014 DATE OF DISCHARGE:                              OPERATIVE REPORT   PREOPERATIVE DIAGNOSIS:  Stenosing tenosynovitis, right thumb.  POSTOPERATIVE DIAGNOSIS:  Stenosing tenosynovitis, right thumb.  OPERATION:  Decompression of FPL of left thumb with release of A1 of right thumb with release of A1 pulley.  SURGEON:  Daryll Brod, M.D.  ASSISTANT:  None.  ANESTHESIA:  Forearm-based IV regional with local infiltration.  ANESTHESIOLOGIST:  Nelda Severe. Tobias Alexander, M.D.  HISTORY:  The patient is a 67 year old male with a history of triggering of his right thumb, this is not responded to conservative treatment.  He has elected to undergo surgical release.  At his request, this is scheduled.  Pre, peri, and postoperative courses have been discussed along with risks and complications.  He is aware that there is no guarantee with the surgery; possibility of infection; recurrence of injury to arteries, nerves, tendons; incomplete relief of symptoms; dystrophy.  In the preoperative area, the patient is seen, the extremity marked by both patient and surgeon.  Antibiotic given.  PROCEDURE IN DETAIL:  The patient was brought to the operating room, where a forearm-based IV regional anesthetic was carried out without difficulty.  He was prepped using ChloraPrep in supine position with the right arm free.  A 3-minute dry time was allowed.  Time-out taken confirming the patient and procedure.  A transverse incision was made across the palmar aspect of the right thumb, just proximal to the metacarpophalangeal joint crease, carried down through subcutaneous tissue.  Neurovascular bundles identified and protected with retractors. The A1 pulley was identified.  This  was found to be markedly thickened. This was incised on its radial aspect.  Carried distally protecting the oblique pulley.  A partial spreading of the tenosynovial tissue proximally was done with blunt dissection.  The finger placed through a full range of motion, no further triggering was noted.  The wound was copiously irrigated with saline, and the skin closed with interrupted 4- 0 Vicryl Rapide sutures.  Local infiltration with 0.25% bupivacaine without epinephrine was given, 3 mL was used.  The wound was then closed with interrupted 4-0 Vicryl Rapide sutures.  A sterile compressive dressing with fingers free was applied.  On deflation of the tourniquet, all fingers immediately pinked.  He was taken to the recovery room for observation in satisfactory condition. He will be discharged home to return to the [facility] in 1 week, on tramadol.          ______________________________ Daryll Brod, M.D.     GK/MEDQ  D:  06/30/2014  T:  06/30/2014  Job:  [id number]

## 2014-07-01 ENCOUNTER — Encounter (HOSPITAL_BASED_OUTPATIENT_CLINIC_OR_DEPARTMENT_OTHER): Payer: Self-pay | Admitting: Orthopedic Surgery

## 2014-07-14 DIAGNOSIS — G4700 Insomnia, unspecified: Secondary | ICD-10-CM | POA: Insufficient documentation

## 2014-07-14 HISTORY — DX: Insomnia, unspecified: G47.00

## 2014-09-06 DIAGNOSIS — J339 Nasal polyp, unspecified: Secondary | ICD-10-CM | POA: Diagnosis not present

## 2014-09-06 DIAGNOSIS — J321 Chronic frontal sinusitis: Secondary | ICD-10-CM | POA: Diagnosis not present

## 2014-09-06 DIAGNOSIS — J322 Chronic ethmoidal sinusitis: Secondary | ICD-10-CM | POA: Diagnosis not present

## 2014-09-06 DIAGNOSIS — J320 Chronic maxillary sinusitis: Secondary | ICD-10-CM | POA: Diagnosis not present

## 2014-09-06 DIAGNOSIS — J323 Chronic sphenoidal sinusitis: Secondary | ICD-10-CM | POA: Diagnosis not present

## 2014-09-09 DIAGNOSIS — M25561 Pain in right knee: Secondary | ICD-10-CM | POA: Diagnosis not present

## 2014-10-03 DIAGNOSIS — M1711 Unilateral primary osteoarthritis, right knee: Secondary | ICD-10-CM | POA: Diagnosis not present

## 2014-10-06 DIAGNOSIS — J321 Chronic frontal sinusitis: Secondary | ICD-10-CM | POA: Diagnosis not present

## 2014-10-06 DIAGNOSIS — J322 Chronic ethmoidal sinusitis: Secondary | ICD-10-CM | POA: Diagnosis not present

## 2014-10-06 DIAGNOSIS — J323 Chronic sphenoidal sinusitis: Secondary | ICD-10-CM | POA: Diagnosis not present

## 2014-10-06 DIAGNOSIS — J339 Nasal polyp, unspecified: Secondary | ICD-10-CM | POA: Diagnosis not present

## 2014-10-06 DIAGNOSIS — J320 Chronic maxillary sinusitis: Secondary | ICD-10-CM | POA: Diagnosis not present

## 2014-10-07 DIAGNOSIS — S83206D Unspecified tear of unspecified meniscus, current injury, right knee, subsequent encounter: Secondary | ICD-10-CM | POA: Diagnosis not present

## 2014-10-11 DIAGNOSIS — S83241A Other tear of medial meniscus, current injury, right knee, initial encounter: Secondary | ICD-10-CM | POA: Diagnosis not present

## 2014-10-11 DIAGNOSIS — S83281A Other tear of lateral meniscus, current injury, right knee, initial encounter: Secondary | ICD-10-CM | POA: Diagnosis not present

## 2014-10-11 DIAGNOSIS — Y929 Unspecified place or not applicable: Secondary | ICD-10-CM | POA: Diagnosis not present

## 2014-10-11 DIAGNOSIS — X58XXXA Exposure to other specified factors, initial encounter: Secondary | ICD-10-CM | POA: Diagnosis not present

## 2014-10-11 DIAGNOSIS — M94261 Chondromalacia, right knee: Secondary | ICD-10-CM | POA: Diagnosis not present

## 2014-10-19 DIAGNOSIS — M94261 Chondromalacia, right knee: Secondary | ICD-10-CM | POA: Diagnosis not present

## 2014-10-19 DIAGNOSIS — S83221D Peripheral tear of medial meniscus, current injury, right knee, subsequent encounter: Secondary | ICD-10-CM | POA: Diagnosis not present

## 2014-10-19 DIAGNOSIS — M25561 Pain in right knee: Secondary | ICD-10-CM | POA: Diagnosis not present

## 2014-10-19 DIAGNOSIS — S83261D Peripheral tear of lateral meniscus, current injury, right knee, subsequent encounter: Secondary | ICD-10-CM | POA: Diagnosis not present

## 2014-10-25 DIAGNOSIS — M25561 Pain in right knee: Secondary | ICD-10-CM | POA: Diagnosis not present

## 2014-10-25 DIAGNOSIS — S83221D Peripheral tear of medial meniscus, current injury, right knee, subsequent encounter: Secondary | ICD-10-CM | POA: Diagnosis not present

## 2014-10-25 DIAGNOSIS — S83261D Peripheral tear of lateral meniscus, current injury, right knee, subsequent encounter: Secondary | ICD-10-CM | POA: Diagnosis not present

## 2014-10-31 DIAGNOSIS — M25561 Pain in right knee: Secondary | ICD-10-CM | POA: Diagnosis not present

## 2014-10-31 DIAGNOSIS — S83221D Peripheral tear of medial meniscus, current injury, right knee, subsequent encounter: Secondary | ICD-10-CM | POA: Diagnosis not present

## 2014-10-31 DIAGNOSIS — S83261D Peripheral tear of lateral meniscus, current injury, right knee, subsequent encounter: Secondary | ICD-10-CM | POA: Diagnosis not present

## 2014-11-03 DIAGNOSIS — S83221D Peripheral tear of medial meniscus, current injury, right knee, subsequent encounter: Secondary | ICD-10-CM | POA: Diagnosis not present

## 2014-11-03 DIAGNOSIS — S83261D Peripheral tear of lateral meniscus, current injury, right knee, subsequent encounter: Secondary | ICD-10-CM | POA: Diagnosis not present

## 2014-11-03 DIAGNOSIS — M25561 Pain in right knee: Secondary | ICD-10-CM | POA: Diagnosis not present

## 2014-11-08 DIAGNOSIS — S83261D Peripheral tear of lateral meniscus, current injury, right knee, subsequent encounter: Secondary | ICD-10-CM | POA: Diagnosis not present

## 2014-11-08 DIAGNOSIS — M25561 Pain in right knee: Secondary | ICD-10-CM | POA: Diagnosis not present

## 2014-11-08 DIAGNOSIS — S83221D Peripheral tear of medial meniscus, current injury, right knee, subsequent encounter: Secondary | ICD-10-CM | POA: Diagnosis not present

## 2014-11-10 DIAGNOSIS — M25561 Pain in right knee: Secondary | ICD-10-CM | POA: Diagnosis not present

## 2014-11-10 DIAGNOSIS — S83261D Peripheral tear of lateral meniscus, current injury, right knee, subsequent encounter: Secondary | ICD-10-CM | POA: Diagnosis not present

## 2014-11-10 DIAGNOSIS — S83221D Peripheral tear of medial meniscus, current injury, right knee, subsequent encounter: Secondary | ICD-10-CM | POA: Diagnosis not present

## 2014-11-15 DIAGNOSIS — M25561 Pain in right knee: Secondary | ICD-10-CM | POA: Diagnosis not present

## 2014-11-15 DIAGNOSIS — S83221D Peripheral tear of medial meniscus, current injury, right knee, subsequent encounter: Secondary | ICD-10-CM | POA: Diagnosis not present

## 2014-11-15 DIAGNOSIS — S83261D Peripheral tear of lateral meniscus, current injury, right knee, subsequent encounter: Secondary | ICD-10-CM | POA: Diagnosis not present

## 2014-11-16 DIAGNOSIS — Z9889 Other specified postprocedural states: Secondary | ICD-10-CM | POA: Diagnosis not present

## 2014-11-28 DIAGNOSIS — J309 Allergic rhinitis, unspecified: Secondary | ICD-10-CM | POA: Diagnosis not present

## 2014-11-28 DIAGNOSIS — J45909 Unspecified asthma, uncomplicated: Secondary | ICD-10-CM | POA: Diagnosis not present

## 2014-11-28 DIAGNOSIS — Z86010 Personal history of colonic polyps: Secondary | ICD-10-CM | POA: Diagnosis not present

## 2014-12-08 DIAGNOSIS — Z86010 Personal history of colonic polyps: Secondary | ICD-10-CM | POA: Diagnosis not present

## 2014-12-08 DIAGNOSIS — K5730 Diverticulosis of large intestine without perforation or abscess without bleeding: Secondary | ICD-10-CM | POA: Diagnosis not present

## 2015-06-29 ENCOUNTER — Encounter: Payer: Self-pay | Admitting: Podiatry

## 2015-06-29 ENCOUNTER — Ambulatory Visit (INDEPENDENT_AMBULATORY_CARE_PROVIDER_SITE_OTHER): Payer: Medicare Other

## 2015-06-29 ENCOUNTER — Ambulatory Visit (INDEPENDENT_AMBULATORY_CARE_PROVIDER_SITE_OTHER): Payer: Medicare Other | Admitting: Podiatry

## 2015-06-29 ENCOUNTER — Ambulatory Visit: Payer: Medicare Other

## 2015-06-29 VITALS — BP 123/73 | HR 72 | Resp 16

## 2015-06-29 DIAGNOSIS — M7742 Metatarsalgia, left foot: Secondary | ICD-10-CM

## 2015-06-29 DIAGNOSIS — M205X9 Other deformities of toe(s) (acquired), unspecified foot: Secondary | ICD-10-CM | POA: Diagnosis not present

## 2015-06-29 DIAGNOSIS — M2042 Other hammer toe(s) (acquired), left foot: Secondary | ICD-10-CM | POA: Diagnosis not present

## 2015-06-29 DIAGNOSIS — M7741 Metatarsalgia, right foot: Secondary | ICD-10-CM

## 2015-06-29 NOTE — Progress Notes (Signed)
   Subjective:    Patient ID: David [name], male    DOB: September 11, 1945, 69 y.o.   MRN: [id number]  HPI  Pt presents with painful bunion type deformity on his left foot   Review of Systems  All other systems reviewed and are negative.      Objective:   Physical Exam        Assessment & Plan:

## 2015-06-29 NOTE — Patient Instructions (Signed)
Pre-Operative Instructions  Congratulations, you have decided to take an important step to improving your quality of life.  You can be assured that the doctors of [facility] will be with you every step of the way.  1. Plan to be at the surgery center/hospital at least 1 (one) hour prior to your scheduled time unless otherwise directed by the surgical center/hospital staff.  You must have a responsible adult accompany you, remain during the surgery and drive you home.  Make sure you have directions to the surgical center/hospital and know how to get there on time. 2. For hospital based surgery you will need to obtain a history and physical form from your family physician within 1 month prior to the date of surgery- we will give you a form for you primary physician.  3. We make every effort to accommodate the date you request for surgery.  There are however, times where surgery dates or times have to be moved.  We will contact you as soon as possible if a change in schedule is required.   4. No Aspirin/Ibuprofen for one week before surgery.  If you are on aspirin, any non-steroidal anti-inflammatory medications (Mobic, Aleve, Ibuprofen) you should stop taking it 7 days prior to your surgery.  You make take Tylenol  For pain prior to surgery.  5. Medications- If you are taking daily heart and blood pressure medications, seizure, reflux, allergy, asthma, anxiety, pain or diabetes medications, make sure the surgery center/hospital is aware before the day of surgery so they may notify you which medications to take or avoid the day of surgery. 6. No food or drink after midnight the night before surgery unless directed otherwise by surgical center/hospital staff. 7. No alcoholic beverages 24 hours prior to surgery.  No smoking 24 hours prior to or 24 hours after surgery. 8. Wear loose pants or shorts- loose enough to fit over bandages, boots, and casts. 9. No slip on shoes, sneakers are best. 10. Bring  your boot with you to the surgery center/hospital.  Also bring crutches or a walker if your physician has prescribed it for you.  If you do not have this equipment, it will be provided for you after surgery. 11. If you have not been contracted by the surgery center/hospital by the day before your surgery, call to confirm the date and time of your surgery. 12. Leave-time from work may vary depending on the type of surgery you have.  Appropriate arrangements should be made prior to surgery with your employer. 13. Prescriptions will be provided immediately following surgery by your doctor.  Have these filled as soon as possible after surgery and take the medication as directed. 14. Remove nail polish on the operative foot. 15. Wash the night before surgery.  The night before surgery wash the foot and leg well with the antibacterial soap provided and water paying special attention to beneath the toenails and in between the toes.  Rinse thoroughly with water and dry well with a towel.  Perform this wash unless told not to do so by your physician.  Enclosed: 1 Ice pack (please put in freezer the night before surgery)   1 Hibiclens skin cleaner   Pre-op Instructions  If you have any questions regarding the instructions, do not hesitate to call our office.  [place]: [address] , [place] [address] [phone]  [place]: [address] [place], [place] [address] [phone]  [place]: [address]  [place], [place] [address] [phone]  Dr. Richard   Tuchman DPM, Dr. Dequon Schnebly DPM Dr. Richard Sikora DPM, Dr. M. Todd Hyatt DPM, Dr. Kathryn Egerton DPM 

## 2015-06-30 NOTE — Progress Notes (Signed)
Subjective:     Patient ID: David [name], male   DOB: 1945/09/04, 69 y.o.   MRN: [id number]  HPI patient presents stating I am getting a lot of pain in the interphalangeal joint of my big toe left and continues to deviated against my second toe and the right is also doing it but not to the same degree. Patient also complains about pain in his metatarsal phalangeal joint when he wear shoe gear and states that been present for a long time   Review of Systems  All other systems reviewed and are negative.      Objective:   Physical Exam  Constitutional: He is oriented to person, place, and time.  Cardiovascular: Intact distal pulses.   Musculoskeletal: Normal range of motion.  Neurological: He is oriented to person, place, and time.  Skin: Skin is warm.  Nursing note and vitals reviewed.  neurovascular status found to be intact muscle strength was adequate range of motion within normal limits. 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 Also is found to have spur formation around the first metatarsal bilateral with mild limitation of motion and irritation with the toe was dorsiflexed. 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 At this point I do think interphalangeal joint fusion along with removal of dorsal spur left first metatarsal could be accomplished. Patient wants surgery and needs to get it done soon due to his work schedule and since he is aware that this is what he wanted done and his wife is had previous surgery by me he wants to review consent form  today. I reviewed with him at great length everything as listed in the consent form going over hallux fusion along with removal of spurs from the first metatarsal and explained to him all complications associated with these procedures. Patient understands all of this and understands alternative procedure and the fact total recovery can be from 6 months to one year and signs consent form. Scheduled for outpatient surgery and is given all preoperative instructions and is encouraged to call with any questions prior to procedure

## 2015-07-11 ENCOUNTER — Telehealth: Payer: Self-pay | Admitting: *Deleted

## 2015-07-11 DIAGNOSIS — Z01818 Encounter for other preprocedural examination: Secondary | ICD-10-CM | POA: Diagnosis not present

## 2015-07-11 DIAGNOSIS — M2012 Hallux valgus (acquired), left foot: Secondary | ICD-10-CM | POA: Diagnosis not present

## 2015-07-11 DIAGNOSIS — M2042 Other hammer toe(s) (acquired), left foot: Secondary | ICD-10-CM | POA: Diagnosis not present

## 2015-07-11 DIAGNOSIS — M898X7 Other specified disorders of bone, ankle and foot: Secondary | ICD-10-CM | POA: Diagnosis not present

## 2015-07-11 DIAGNOSIS — M2022 Hallux rigidus, left foot: Secondary | ICD-10-CM | POA: Diagnosis not present

## 2015-07-11 DIAGNOSIS — M13871 Other specified arthritis, right ankle and foot: Secondary | ICD-10-CM | POA: Diagnosis not present

## 2015-07-11 DIAGNOSIS — M21611 Bunion of right foot: Secondary | ICD-10-CM | POA: Diagnosis not present

## 2015-07-11 NOTE — Telephone Encounter (Signed)
Pt's wife, Jocelyn Lamer states pt had surgery today with Dr. Paulla Dolly and the pain medication is not dated, and the [facility] will not fill.  I called [facility] and informed them the date was for today post op pain medication.

## 2015-07-20 ENCOUNTER — Ambulatory Visit (INDEPENDENT_AMBULATORY_CARE_PROVIDER_SITE_OTHER): Payer: Medicare Other

## 2015-07-20 ENCOUNTER — Ambulatory Visit (INDEPENDENT_AMBULATORY_CARE_PROVIDER_SITE_OTHER): Payer: Medicare Other | Admitting: Podiatry

## 2015-07-20 VITALS — BP 117/82 | HR 74 | Temp 98.2°F | Resp 16

## 2015-07-20 DIAGNOSIS — M2042 Other hammer toe(s) (acquired), left foot: Secondary | ICD-10-CM

## 2015-07-20 DIAGNOSIS — T8140XA Infection following a procedure, unspecified, initial encounter: Secondary | ICD-10-CM

## 2015-07-20 DIAGNOSIS — Z9889 Other specified postprocedural states: Secondary | ICD-10-CM | POA: Diagnosis not present

## 2015-07-20 DIAGNOSIS — T814XXA Infection following a procedure, initial encounter: Secondary | ICD-10-CM

## 2015-07-21 ENCOUNTER — Encounter: Payer: Self-pay | Admitting: *Deleted

## 2015-07-21 NOTE — Progress Notes (Signed)
Subjective:     Patient ID: David [name], male   DOB: 05/21/1946, 69 y.o.   MRN: [id number]  HPI patient states I'm doing real well with my left foot and I admit I been active but I am not having significant pain at this time   Review of Systems     Objective:   Physical Exam Neurovascular status intact negative Homans sign noted wound edges coapted well hallux and first metatarsal with no drainage noted or erythema with good alignment of the hallux noted    Assessment:     Doing well post hallux fusion left and metatarsal bone removal first metatarsal left    Plan:     Reviewed x-rays reapplied sterile dressing and instructed on continued immobilization. Placed in a different type of surgical shoe and reappoint in the next several weeks for [place] to reevaluate or earlier if any issues should occur and will reappoint for suture removal

## 2015-08-03 ENCOUNTER — Ambulatory Visit (INDEPENDENT_AMBULATORY_CARE_PROVIDER_SITE_OTHER): Payer: Medicare Other

## 2015-08-03 ENCOUNTER — Ambulatory Visit (INDEPENDENT_AMBULATORY_CARE_PROVIDER_SITE_OTHER): Payer: Medicare Other | Admitting: Podiatry

## 2015-08-03 DIAGNOSIS — M2042 Other hammer toe(s) (acquired), left foot: Secondary | ICD-10-CM

## 2015-08-03 DIAGNOSIS — M205X9 Other deformities of toe(s) (acquired), unspecified foot: Secondary | ICD-10-CM

## 2015-08-03 DIAGNOSIS — Z9889 Other specified postprocedural states: Secondary | ICD-10-CM

## 2015-08-03 MED ORDER — CEPHALEXIN 500 MG PO CAPS: 500.0000 mg | ORAL_CAPSULE | Freq: Two times a day (BID) | ORAL | Status: DC

## 2015-08-04 NOTE — Progress Notes (Signed)
Subjective:     Patient ID: David [name], male   DOB: Jul 01, 1946, 69 y.o.   MRN: [id number]  HPI patient presents stating he's been doing well but he admits that he has been over active on his foot and has been to the gym working out. Patient states that his big toe is a little bit swollen but he's not noted any drainage   Review of Systems     Objective:   Physical Exam Neurovascular status was intact negative Homans sign noted with well coapted incision site left first metatarsal and interphalangeal joint hallux with mild redness around the hallux interphalangeal site but no drainage no proximal edema erythema drainage noted and mild edema noted secondary to excessive activity    Assessment:     Appears to be doing well but he has been excessively active creating some inflammation and stress on the incision site of the left interphalangeal joint    Plan:     Reviewed condition and x-rays. Advised on reduced activity and as a precautionary measure placed him on cephalexin 5 number milligrams twice a day and gave him strict instructions to evaluate the site and if any further redness drainage swelling or pain were to occur to let us know immediately area patient should resolve uneventfully but does need to be easier on the foot and needs to utilize elevation and immobilization

## 2015-09-11 ENCOUNTER — Ambulatory Visit (INDEPENDENT_AMBULATORY_CARE_PROVIDER_SITE_OTHER): Payer: Medicare Other | Admitting: Podiatry

## 2015-09-11 ENCOUNTER — Other Ambulatory Visit: Payer: Medicare Other

## 2015-09-11 ENCOUNTER — Ambulatory Visit (INDEPENDENT_AMBULATORY_CARE_PROVIDER_SITE_OTHER): Payer: Medicare Other

## 2015-09-11 DIAGNOSIS — M205X9 Other deformities of toe(s) (acquired), unspecified foot: Secondary | ICD-10-CM

## 2015-09-11 DIAGNOSIS — Z9889 Other specified postprocedural states: Secondary | ICD-10-CM | POA: Diagnosis not present

## 2015-09-11 DIAGNOSIS — M2042 Other hammer toe(s) (acquired), left foot: Secondary | ICD-10-CM

## 2015-09-11 NOTE — Progress Notes (Signed)
Subjective:     Patient ID: David [name], male   DOB: 04/08/1946, 70 y.o.   MRN: [id number]  HPI patient presents stating I'm doing really well with swelling still if him on my foot to long   Review of Systems     Objective:   Physical Exam  neurovascular status intact muscle strength adequate with good alignment of the first metatarsophalangeal joint and good range of motion    Assessment:      doing well post forefoot reconstruction left    Plan:      x-rays reviewed and patient can resume normal activities and if swelling persist will be seen back to recheck

## 2015-09-14 ENCOUNTER — Other Ambulatory Visit: Payer: Medicare Other

## 2016-02-06 DIAGNOSIS — H1789 Other corneal scars and opacities: Secondary | ICD-10-CM | POA: Diagnosis not present

## 2016-02-06 DIAGNOSIS — H52223 Regular astigmatism, bilateral: Secondary | ICD-10-CM | POA: Diagnosis not present

## 2016-02-06 DIAGNOSIS — H5202 Hypermetropia, left eye: Secondary | ICD-10-CM | POA: Diagnosis not present

## 2016-02-06 DIAGNOSIS — H2513 Age-related nuclear cataract, bilateral: Secondary | ICD-10-CM | POA: Diagnosis not present

## 2016-02-26 DIAGNOSIS — H35372 Puckering of macula, left eye: Secondary | ICD-10-CM | POA: Diagnosis not present

## 2016-02-26 DIAGNOSIS — H02839 Dermatochalasis of unspecified eye, unspecified eyelid: Secondary | ICD-10-CM | POA: Diagnosis not present

## 2016-02-26 DIAGNOSIS — H2512 Age-related nuclear cataract, left eye: Secondary | ICD-10-CM | POA: Diagnosis not present

## 2016-02-26 DIAGNOSIS — H2513 Age-related nuclear cataract, bilateral: Secondary | ICD-10-CM | POA: Diagnosis not present

## 2016-03-21 ENCOUNTER — Encounter (INDEPENDENT_AMBULATORY_CARE_PROVIDER_SITE_OTHER): Payer: Medicare Other | Admitting: Ophthalmology

## 2016-03-21 DIAGNOSIS — H2513 Age-related nuclear cataract, bilateral: Secondary | ICD-10-CM | POA: Diagnosis not present

## 2016-03-21 DIAGNOSIS — H35373 Puckering of macula, bilateral: Secondary | ICD-10-CM | POA: Diagnosis not present

## 2016-03-21 DIAGNOSIS — H43813 Vitreous degeneration, bilateral: Secondary | ICD-10-CM | POA: Diagnosis not present

## 2016-04-15 DIAGNOSIS — H2512 Age-related nuclear cataract, left eye: Secondary | ICD-10-CM | POA: Diagnosis not present

## 2016-04-16 DIAGNOSIS — H2512 Age-related nuclear cataract, left eye: Secondary | ICD-10-CM | POA: Diagnosis not present

## 2016-04-23 DIAGNOSIS — L820 Inflamed seborrheic keratosis: Secondary | ICD-10-CM | POA: Diagnosis not present

## 2016-04-23 DIAGNOSIS — L738 Other specified follicular disorders: Secondary | ICD-10-CM | POA: Diagnosis not present

## 2016-04-26 ENCOUNTER — Encounter: Payer: Self-pay | Admitting: *Deleted

## 2016-04-26 NOTE — Progress Notes (Signed)
   DOS 07-10-16  Silver bunionectomy left, Hallux IPJ fusion left

## 2016-05-07 DIAGNOSIS — H2511 Age-related nuclear cataract, right eye: Secondary | ICD-10-CM | POA: Diagnosis not present

## 2016-05-16 ENCOUNTER — Ambulatory Visit (INDEPENDENT_AMBULATORY_CARE_PROVIDER_SITE_OTHER): Payer: Medicare Other

## 2016-05-16 ENCOUNTER — Ambulatory Visit (INDEPENDENT_AMBULATORY_CARE_PROVIDER_SITE_OTHER): Payer: Medicare Other | Admitting: Podiatry

## 2016-05-16 DIAGNOSIS — M79675 Pain in left toe(s): Secondary | ICD-10-CM

## 2016-05-23 DIAGNOSIS — Z79899 Other long term (current) drug therapy: Secondary | ICD-10-CM | POA: Diagnosis not present

## 2016-05-23 DIAGNOSIS — Z23 Encounter for immunization: Secondary | ICD-10-CM | POA: Diagnosis not present

## 2016-05-23 DIAGNOSIS — Z1159 Encounter for screening for other viral diseases: Secondary | ICD-10-CM | POA: Diagnosis not present

## 2016-05-23 DIAGNOSIS — R350 Frequency of micturition: Secondary | ICD-10-CM | POA: Diagnosis not present

## 2016-05-23 DIAGNOSIS — Z0000 Encounter for general adult medical examination without abnormal findings: Secondary | ICD-10-CM | POA: Diagnosis not present

## 2016-05-23 DIAGNOSIS — R062 Wheezing: Secondary | ICD-10-CM | POA: Diagnosis not present

## 2016-05-29 ENCOUNTER — Encounter: Payer: Self-pay | Admitting: Internal Medicine

## 2016-05-29 ENCOUNTER — Ambulatory Visit (INDEPENDENT_AMBULATORY_CARE_PROVIDER_SITE_OTHER): Payer: Medicare Other | Admitting: Internal Medicine

## 2016-05-29 VITALS — BP 124/92 | HR 68 | Ht 68.0 in | Wt 165.0 lb

## 2016-05-29 DIAGNOSIS — J45991 Cough variant asthma: Secondary | ICD-10-CM | POA: Diagnosis not present

## 2016-05-29 LAB — NITRIC OXIDE: Nitric Oxide: 66

## 2016-05-29 MED ORDER — AMOXICILLIN-POT CLAVULANATE 875-125 MG PO TABS: 1.0000 | ORAL_TABLET | Freq: Two times a day (BID) | ORAL | 0 refills | Status: AC

## 2016-05-29 MED ORDER — PREDNISONE 10 MG PO TABS: ORAL_TABLET | ORAL | 0 refills | Status: DC

## 2016-05-29 MED ORDER — MOMETASONE FURO-FORMOTEROL FUM 100-5 MCG/ACT IN AERO: INHALATION_SPRAY | RESPIRATORY_TRACT | 11 refills | Status: DC

## 2016-05-29 NOTE — Assessment & Plan Note (Signed)
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 despite rx with symbiocrt   DDX of  difficult airways management almost all start with A and  include Adherence, Ace Inhibitors, Acid Reflux, Active Sinus Disease, Alpha 1 Antitripsin deficiency, Anxiety masquerading as Airways dz,  ABPA,  Allergy(esp in young), Aspiration (esp in elderly), Adverse effects of meds,  Active smokers, A bunch of PE's (a small clot burden can't cause this syndrome unless there is already severe underlying pulm or vascular dz with poor reserve) plus two Bs  = Bronchiectasis and Beta blocker use..and one C= CHF     Adherence is always the initial "prime suspect" and is a multilayered concern that requires a "trust but verify" approach in every patient - starting with knowing how to use medications, especially inhalers, correctly, keeping up with refills and understanding the fundamental difference between maintenance and prns vs those medications only taken for a very short course and then stopped and not refilled.   - The proper method of use, as well as anticipated side effects, of a metered-dose inhaler are discussed and demonstrated to the patient. Improved effectiveness after extensive coaching during this visit to a level of approximately 90 % from a baseline of 75  %     ? Allergy > supported by feno but not that strongly and since symb 160 causing cough will try the approach of less is more and rx dulera 100 2bid and for now just rx with pred x 6 days  ? Acitve Sinus dz > s/p remote polypectomy > rec Augmentin 875 mg take one pill twice daily  X 10 days - take at breakfast and supper with large glass of water.  It would help reduce the usual side effects (diarrhea and yeast infections) if you ate cultured yogurt at lunch.   ? ABPA > consider IgE next ov if not better as previous response to prednisone  suggests this may be IgE mediated even if not atopic in the usual sense as per prev allergy eval  Total time devoted to counseling  = 35/22m review case with pt/ discussion of options/alternatives/ personally creating written instructions  in presence of pt  then going over those specific  Instructions directly with the pt including how to use all of the meds but in particular covering each new medication in detail and the difference between the maintenance/automatic meds and the prns using an action plan format for the latter.

## 2016-05-29 NOTE — Progress Notes (Signed)
Subjective:     Patient ID: David [name], male   DOB: 1945-11-24,    MRN: [id number]  HPI  4 yowm never regular / runner since his 20's some wheezing in 35's dx as EIA only , no better with saba > went to symbicort research program minimally improved but seem like symbicort ? Strength ? Made wheeze better but cough worse referred to pulmonary clinic 05/29/2016 by Dr   Madelyn Brunner  Previous allergy eval at [facility] pos mold only/ told he had asthma  05/29/2016 1st [facility] office visit/ David [name]   Chief Complaint  Patient presents with  . Pulmonary Consult    Referred by Dr. Hayden PedroOwens Shark. Pt c/o SOB for the past 10 yrs, worse x 2 wks. He states he is SOB "all the time" worse with exertion. He also c/o cough with yellow to green sputum. He states he has seen an allergist in the past. Prednisone helps temporarily.   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 Takes asa daily.   Wakes up with lots of congestion /wheeze no worse off symbicort but only stopped it 1.5 d prior to OV   Overall worse x 2 weeks   No obvious day to day or daytime variability or assoc  mucus plugs or hemoptysis or cp or chest tightness, subjective wheeze or overt sinus or hb symptoms. No unusual exp hx or h/o childhood pna/ asthma or knowledge of premature birth.   Also denies any obvious fluctuation of symptoms with weather or environmental changes or other aggravating or alleviating factors except as outlined above   Current Medications, Allergies, Complete Past Medical History, Past Surgical History, Family History, and Social History were reviewed in [employer] record.  ROS  The following are not active complaints unless bolded sore throat, dysphagia, dental problems, itching, sneezing,  nasal congestion or excess/ purulent secretions, ear ache,   fever, chills, sweats, unintended wt loss,  classically pleuritic or exertional cp,  orthopnea pnd or leg swelling, presyncope, palpitations, abdominal pain, anorexia, nausea, vomiting, diarrhea  or change in bowel or bladder habits, change in stools or urine, dysuria,hematuria,  rash, arthralgias, visual complaints, headache, numbness, weakness or ataxia or problems with walking or coordination,  change in mood/affect or memory.         Review of Systems     Objective:   Physical Exam    amb thin wm nad  Wt Readings from Last 3 Encounters:  05/29/16 165 lb (74.8 kg)  06/30/14 164 lb 6.4 oz (74.6 kg)  06/12/14 160 lb (72.6 kg)    Vital signs reviewed   HEENT: nl dentition, turbinates, and oropharynx. Nl external ear canals without cough reflex   NECK :  without JVD/Nodes/TM/ nl carotid upstrokes bilaterally   LUNGS: no acc muscle use,  Nl contour chest with priominent insp/exp rhonchi bilaterally   CV:  RRR  no s3 or murmur or increase in P2, no edema   ABD:  soft and nontender with nl inspiratory excursion in the supine position. No bruits or organomegaly, bowel sounds nl  MS:  Nl gait/ ext warm without deformities, calf tenderness, cyanosis or clubbing No obvious joint restrictions   SKIN: warm and dry without lesions    NEURO:  alert, approp, nl sensorium with  no motor deficits        Assessment:

## 2016-05-29 NOTE — Patient Instructions (Addendum)
Augmentin 875 mg take one pill twice daily  X 10 days - take at breakfast and supper with large glass of water.  It would help reduce the usual side effects (diarrhea and yeast infections) if you ate cultured yogurt at lunch.   Prednisone 10 mg take  4 each am x 2 days,   2 each am x 2 days,  1 each am x 2 days and stop   Try dulera 100 Take 2 puffs first thing in am and then another 2 puffs about 12 hours later.   Work on inhaler technique:  relax and gently blow all the way out then take a nice smooth deep breath back in, triggering the inhaler at same time you start breathing in.  Hold for up to 5 seconds if you can. Blow out thru nose. Rinse and gargle with water when done     For cough > mucinex dm up to 1200 mg every 12 hours as needed

## 2016-05-30 ENCOUNTER — Institutional Professional Consult (permissible substitution): Payer: Medicare Other | Admitting: Internal Medicine

## 2016-05-31 NOTE — Progress Notes (Signed)
Subjective:     Patient ID: David [name], male   DOB: [birth year], 70 y.o.   MRN: [id number]  HPI patient presents stating I'm having pain at the end of my left big toe and I'm not sure what may be going on and also the joint at times gets tender   Review of Systems     Objective:   Physical Exam Neurovascular status intact with prominence of the left distal area where we had placed the screw with irritation and excellent motion first MPJ but pain he experiences    Assessment:     Difficult to understand joint as it does appear to be much improved from preoperatively with prominent screw position left    Plan:     Have recommended removal of the screw left with possible joint injection and educated him on this allowing him to read consent form going over alternative treatments complications. I did explain that there is no guarantee that this will solve all problems that this patient experiences

## 2016-06-11 ENCOUNTER — Encounter: Payer: Self-pay | Admitting: Podiatry

## 2016-06-11 DIAGNOSIS — M779 Enthesopathy, unspecified: Secondary | ICD-10-CM | POA: Diagnosis not present

## 2016-06-11 DIAGNOSIS — R062 Wheezing: Secondary | ICD-10-CM | POA: Diagnosis not present

## 2016-06-11 DIAGNOSIS — Z472 Encounter for removal of internal fixation device: Secondary | ICD-10-CM | POA: Diagnosis not present

## 2016-06-11 DIAGNOSIS — Z4889 Encounter for other specified surgical aftercare: Secondary | ICD-10-CM | POA: Diagnosis not present

## 2016-06-11 DIAGNOSIS — M722 Plantar fascial fibromatosis: Secondary | ICD-10-CM | POA: Diagnosis not present

## 2016-06-20 DIAGNOSIS — R748 Abnormal levels of other serum enzymes: Secondary | ICD-10-CM | POA: Diagnosis not present

## 2016-06-20 DIAGNOSIS — E7800 Pure hypercholesterolemia, unspecified: Secondary | ICD-10-CM | POA: Diagnosis not present

## 2016-06-21 ENCOUNTER — Ambulatory Visit (INDEPENDENT_AMBULATORY_CARE_PROVIDER_SITE_OTHER): Payer: Medicare Other | Admitting: Podiatry

## 2016-06-21 ENCOUNTER — Ambulatory Visit (INDEPENDENT_AMBULATORY_CARE_PROVIDER_SITE_OTHER): Payer: Medicare Other

## 2016-06-21 ENCOUNTER — Encounter: Payer: Self-pay | Admitting: Podiatry

## 2016-06-21 VITALS — BP 126/79 | HR 64 | Resp 16

## 2016-06-21 DIAGNOSIS — Z9889 Other specified postprocedural states: Secondary | ICD-10-CM

## 2016-06-21 DIAGNOSIS — M2040 Other hammer toe(s) (acquired), unspecified foot: Secondary | ICD-10-CM | POA: Diagnosis not present

## 2016-06-24 NOTE — Progress Notes (Signed)
Subjective: David [name] is a 70 y.o. is seen today in office s/p left HWR preformed on 06/11/16. Presents today for suture removal He states that he is not having any pain. Denies any systemic complaints such as fevers, chills, nausea, vomiting. No calf pain, chest pain, shortness of breath.   Objective: General: No acute distress, AAOx3  DP/PT pulses palpable 2/4, CRT < 3 sec to all digits.  Protective sensation intact. Motor function intact.  Left foot: Incision is well coapted without any evidence of dehiscence and sutures intact. There is no surrounding erythema, ascending cellulitis, fluctuance, crepitus, malodor, drainage/purulence. There is trace edema around the surgical site. There is no pain along the surgical site.  No other areas of tenderness to bilateral lower extremities.  No other open lesions or pre-ulcerative lesions.  No pain with calf compression, swelling, warmth, erythema.   Assessment and Plan:  Status post left HWR, doing well with no complications   -Treatment options discussed including all alternatives, risks, and complications -Sutures removed today. Antibiotic was applied followed by a bandage. Continue with this as well next couple of days. -Continue with supportive shoe gear which he started been wearing. -Ice/elevation -Pain medication as needed. -Monitor for any clinical signs or symptoms of infection and DVT/PE and directed to call the office immediately should any occur or go to the ER. -Follow-up as scheduled or sooner if any problems arise. In the meantime, encouraged to call the office with any questions, concerns, change in symptoms.   Celesta Gentile, DPM

## 2016-06-28 NOTE — Progress Notes (Signed)
DOS 10.10.2017 Removal Screw 1st Left; Injection 1st MPJ Left

## 2016-07-16 ENCOUNTER — Ambulatory Visit: Payer: Medicare Other | Admitting: Internal Medicine

## 2016-07-18 ENCOUNTER — Other Ambulatory Visit (INDEPENDENT_AMBULATORY_CARE_PROVIDER_SITE_OTHER): Payer: Medicare Other

## 2016-07-18 ENCOUNTER — Ambulatory Visit: Payer: Medicare Other | Admitting: Internal Medicine

## 2016-07-18 ENCOUNTER — Encounter: Payer: Self-pay | Admitting: Internal Medicine

## 2016-07-18 DIAGNOSIS — J45991 Cough variant asthma: Secondary | ICD-10-CM

## 2016-07-18 LAB — CBC WITH DIFFERENTIAL/PLATELET
Basophils Absolute: 0 10*3/uL (ref 0.0–0.1)
Basophils Relative: 0.7 % (ref 0.0–3.0)
Eosinophils Absolute: 0.4 10*3/uL (ref 0.0–0.7)
Eosinophils Relative: 6.4 % — ABNORMAL HIGH (ref 0.0–5.0)
HCT: 45.4 % (ref 39.0–52.0)
Hemoglobin: 15.4 g/dL (ref 13.0–17.0)
Lymphocytes Relative: 26.3 % (ref 12.0–46.0)
Lymphs Abs: 1.7 10*3/uL (ref 0.7–4.0)
MCHC: 33.9 g/dL (ref 30.0–36.0)
MCV: 92.3 fl (ref 78.0–100.0)
Monocytes Absolute: 0.6 10*3/uL (ref 0.1–1.0)
Monocytes Relative: 9 % (ref 3.0–12.0)
Neutro Abs: 3.7 10*3/uL (ref 1.4–7.7)
Neutrophils Relative %: 57.6 % (ref 43.0–77.0)
Platelets: 224 10*3/uL (ref 150.0–400.0)
RBC: 4.92 Mil/uL (ref 4.22–5.81)
RDW: 14 % (ref 11.5–15.5)
WBC: 6.4 10*3/uL (ref 4.0–10.5)

## 2016-07-18 NOTE — Progress Notes (Signed)
Subjective:     Patient ID: David [name], male   DOB: 01/30/1946,    MRN: [id number]    Brief patient profile:  54 yowm never regular / runner since his 20's some wheezing in 77's dx as EIA only , no better with saba > went to symbicort research program minimally improved but seemed like symbicort ? Strength ? Made wheeze better but cough worse referred to pulmonary clinic 05/29/2016 by Dr   Madelyn Brunner  Previous allergy eval at [facility] pos mold only/ told he had asthma  History of Present Illness  05/29/2016 1st [facility] office visit/ David [name]   Chief Complaint  Patient presents with  . Pulmonary Consult    Referred by Dr. Hayden PedroOwens Shark. Pt c/o SOB for the past 10 yrs, worse x 2 wks. He states he is SOB "all the time" worse with exertion. He also c/o cough with yellow to green sputum. He states he has seen an allergist in the past. Prednisone helps temporarily.   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 Takes asa daily.   Wakes up with lots of congestion /wheeze no worse off symbicort but only stopped it 1.5 d prior to OV   Overall worse x 2 weeks  rec Augmentin 875 mg take one pill twice daily  X 10 days - take at breakfast and supper with large glass of water.  It would help reduce the usual side effects (diarrhea and yeast infections) if you ate cultured yogurt at lunch.  Prednisone 10 mg take  4 each am x 2 days,   2 each am x 2 days,  1 each am x 2 days and stop  Try dulera 100 Take 2 puffs first thing in am and then another 2 puffs about 12 hours later.  Work on inhaler technique:   For cough > mucinex dm up to 1200 mg every 12 hours as needed    07/18/2016  f/u ov/David [name] re:  Chief Complaint  Patient presents with  . Follow-up    He is coughing less and his breathing has returned back to his normal baseline. He has occ minimal yellow sputum.          No obvious day to day or  daytime variability or assoc  mucus plugs or hemoptysis or cp or chest tightness, subjective wheeze or overt sinus or hb symptoms. No unusual exp hx or h/o childhood pna/ asthma or knowledge of premature birth.   Also denies any obvious fluctuation of symptoms with weather or environmental changes or other aggravating or alleviating factors except as outlined above   Current Medications, Allergies, Complete Past Medical History, Past Surgical History, Family History, and Social History were reviewed in [employer] record.  ROS  The following are not active complaints unless bolded sore throat, dysphagia, dental problems, itching, sneezing,  nasal congestion or excess/ purulent secretions, ear ache,   fever, chills, sweats, unintended wt loss, classically pleuritic or exertional cp,  orthopnea pnd or leg swelling, presyncope, palpitations, abdominal pain, anorexia, nausea, vomiting, diarrhea  or change in bowel or bladder habits, change in stools or urine, dysuria,hematuria,  rash, arthralgias, visual complaints, headache, numbness, weakness or ataxia or problems with walking or coordination,  change in mood/affect or memory.               Objective:   Physical Exam  amb thin wm nad     05/29/16 165 lb (74.8 kg)  06/30/14 164 lb 6.4 oz (74.6 kg)  06/12/14 160 lb (72.6 kg)    Vital signs reviewed   HEENT: nl dentition, turbinates, and oropharynx. Nl external ear canals without cough reflex   NECK :  without JVD/Nodes/TM/ nl carotid upstrokes bilaterally   LUNGS: no acc muscle use,  Nl contour chest with priominent insp/exp rhonchi bilaterally   CV:  RRR  no s3 or murmur or increase in P2, no edema   ABD:  soft and nontender with nl inspiratory excursion in the supine position. No bruits or organomegaly, bowel sounds nl  MS:  Nl gait/ ext warm without deformities, calf tenderness, cyanosis or clubbing No obvious joint restrictions   SKIN: warm and dry  without lesions    NEURO:  alert, approp, nl sensorium with  no motor deficits        Assessment:

## 2016-07-18 NOTE — Patient Instructions (Addendum)
Please remember to go to the lab   department downstairs for your tests - we will call you with the results when they are available.  symbicort 160 1-2 puffs every 12 hours if you have any respiratory symptoms at all  Work on inhaler technique:  relax and gently blow all the way out then take a nice smooth deep breath back in, triggering the inhaler at same time you start breathing in.  Hold for up to 5 seconds if you can. Blow out thru nose. Rinse and gargle with water when done   If you are satisfied with your treatment plan,  let your doctor know and he/she can either refill your medications or you can return here when your prescription runs out.     If in any way you are not 100% satisfied,  please tell us.  If 100% better, tell your friends!  Pulmonary follow up is as needed

## 2016-07-19 LAB — RESPIRATORY ALLERGY PROFILE REGION II ~~LOC~~
Allergen, A. alternata, m6: <0.1 kU/L
Allergen, C. Herbarum, M2: <0.1 kU/L
Allergen, Cedar tree, t12: <0.1 kU/L
Allergen, Comm Silver Birch, t9: <0.1 kU/L
Allergen, Cottonwood, t14: <0.1 kU/L
Allergen, D pternoyssinus,d7: <0.1 kU/L
Allergen, Mouse Urine Protein, e78: <0.1 kU/L
Allergen, Mulberry, t76: <0.1 kU/L
Allergen, Oak,t7: <0.1 kU/L
Allergen, P. notatum, m1: <0.1 kU/L
Aspergillus fumigatus, m3: <0.1 kU/L
Bermuda Grass: <0.1 kU/L
Box Elder IgE: <0.1 kU/L
Cat Dander: <0.1 kU/L
Cockroach: <0.1 kU/L
Common Ragweed: <0.1 kU/L
D. farinae: <0.1 kU/L
Dog Dander: <0.1 kU/L
Elm IgE: <0.1 kU/L
IgE (Immunoglobulin E), Serum: 111 kU/L (ref <115)
Johnson Grass: <0.1 kU/L
Pecan/Hickory Tree IgE: <0.1 kU/L
Rough Pigweed  IgE: <0.1 kU/L
Sheep Sorrel IgE: <0.1 kU/L
Timothy Grass: <0.1 kU/L

## 2017-05-27 DIAGNOSIS — N529 Male erectile dysfunction, unspecified: Secondary | ICD-10-CM | POA: Diagnosis not present

## 2017-05-27 DIAGNOSIS — Z0000 Encounter for general adult medical examination without abnormal findings: Secondary | ICD-10-CM | POA: Diagnosis not present

## 2017-06-06 DIAGNOSIS — M1711 Unilateral primary osteoarthritis, right knee: Secondary | ICD-10-CM | POA: Diagnosis not present

## 2017-11-25 DIAGNOSIS — Z79899 Other long term (current) drug therapy: Secondary | ICD-10-CM | POA: Diagnosis not present

## 2017-11-25 DIAGNOSIS — K4020 Bilateral inguinal hernia, without obstruction or gangrene, not specified as recurrent: Secondary | ICD-10-CM | POA: Diagnosis not present

## 2017-11-25 DIAGNOSIS — J4520 Mild intermittent asthma, uncomplicated: Secondary | ICD-10-CM | POA: Diagnosis not present

## 2017-11-25 DIAGNOSIS — R350 Frequency of micturition: Secondary | ICD-10-CM | POA: Diagnosis not present

## 2018-04-04 ENCOUNTER — Emergency Department (HOSPITAL_COMMUNITY): Payer: Medicare Other

## 2018-04-04 ENCOUNTER — Other Ambulatory Visit: Payer: Self-pay

## 2018-04-04 ENCOUNTER — Inpatient Hospital Stay (HOSPITAL_COMMUNITY)
Admission: EM | Admit: 2018-04-04 | Discharge: 2018-04-07 | DRG: 287 | Disposition: A | Discharge status: Home / Self Care | Payer: Medicare Other | Attending: Cardiology | Admitting: Cardiology

## 2018-04-04 ENCOUNTER — Encounter (HOSPITAL_COMMUNITY): Payer: Self-pay

## 2018-04-04 DIAGNOSIS — E8770 Fluid overload, unspecified: Secondary | ICD-10-CM | POA: Diagnosis present

## 2018-04-04 DIAGNOSIS — R748 Abnormal levels of other serum enzymes: Secondary | ICD-10-CM

## 2018-04-04 DIAGNOSIS — Z8249 Family history of ischemic heart disease and other diseases of the circulatory system: Secondary | ICD-10-CM

## 2018-04-04 DIAGNOSIS — Z7982 Long term (current) use of aspirin: Secondary | ICD-10-CM

## 2018-04-04 DIAGNOSIS — R000 Tachycardia, unspecified: Secondary | ICD-10-CM | POA: Diagnosis not present

## 2018-04-04 DIAGNOSIS — R635 Abnormal weight gain: Secondary | ICD-10-CM | POA: Diagnosis present

## 2018-04-04 DIAGNOSIS — Z7951 Long term (current) use of inhaled steroids: Secondary | ICD-10-CM

## 2018-04-04 DIAGNOSIS — Z79899 Other long term (current) drug therapy: Secondary | ICD-10-CM

## 2018-04-04 DIAGNOSIS — R600 Localized edema: Secondary | ICD-10-CM | POA: Diagnosis present

## 2018-04-04 DIAGNOSIS — I442 Atrioventricular block, complete: Secondary | ICD-10-CM

## 2018-04-04 DIAGNOSIS — R7989 Other specified abnormal findings of blood chemistry: Secondary | ICD-10-CM | POA: Diagnosis present

## 2018-04-04 DIAGNOSIS — I441 Atrioventricular block, second degree: Secondary | ICD-10-CM

## 2018-04-04 DIAGNOSIS — R0609 Other forms of dyspnea: Secondary | ICD-10-CM

## 2018-04-04 DIAGNOSIS — R778 Other specified abnormalities of plasma proteins: Secondary | ICD-10-CM

## 2018-04-04 DIAGNOSIS — Z820 Family history of epilepsy and other diseases of the nervous system: Secondary | ICD-10-CM

## 2018-04-04 DIAGNOSIS — R0602 Shortness of breath: Secondary | ICD-10-CM | POA: Diagnosis not present

## 2018-04-04 DIAGNOSIS — R001 Bradycardia, unspecified: Secondary | ICD-10-CM | POA: Diagnosis not present

## 2018-04-04 DIAGNOSIS — J45990 Exercise induced bronchospasm: Secondary | ICD-10-CM | POA: Diagnosis not present

## 2018-04-04 HISTORY — DX: Atrioventricular block, complete: I44.2

## 2018-04-04 LAB — COMPREHENSIVE METABOLIC PANEL
ALT: 53 U/L — ABNORMAL HIGH (ref 0–44)
AST: 49 U/L — ABNORMAL HIGH (ref 15–41)
Albumin: 3.9 g/dL (ref 3.5–5.0)
Alkaline Phosphatase: 56 U/L (ref 38–126)
Anion gap: 9 (ref 5–15)
BUN: 22 mg/dL (ref 8–23)
CO2: 26 mmol/L (ref 22–32)
Calcium: 8.9 mg/dL (ref 8.9–10.3)
Chloride: 106 mmol/L (ref 98–111)
Creatinine, Ser: 1.11 mg/dL (ref 0.61–1.24)
GFR calc Af Amer: >60 mL/min (ref >60)
GFR calc non Af Amer: >60 mL/min (ref >60)
Glucose, Bld: 86 mg/dL (ref 70–99)
Potassium: 4.1 mmol/L (ref 3.5–5.1)
Sodium: 141 mmol/L (ref 135–145)
Total Bilirubin: 0.6 mg/dL (ref 0.3–1.2)
Total Protein: 6.6 g/dL (ref 6.5–8.1)

## 2018-04-04 LAB — CBC WITH DIFFERENTIAL/PLATELET
Basophils Absolute: 0 10*3/uL (ref 0.0–0.1)
Basophils Relative: 0 %
Eosinophils Absolute: 0.4 10*3/uL (ref 0.0–0.7)
Eosinophils Relative: 7 %
HCT: 42.2 % (ref 39.0–52.0)
Hemoglobin: 14 g/dL (ref 13.0–17.0)
Lymphocytes Relative: 32 %
Lymphs Abs: 1.9 10*3/uL (ref 0.7–4.0)
MCH: 31.9 pg (ref 26.0–34.0)
MCHC: 33.2 g/dL (ref 30.0–36.0)
MCV: 96.1 fL (ref 78.0–100.0)
Monocytes Absolute: 0.5 10*3/uL (ref 0.1–1.0)
Monocytes Relative: 9 %
Neutro Abs: 3 10*3/uL (ref 1.7–7.7)
Neutrophils Relative %: 52 %
Platelets: 190 10*3/uL (ref 150–400)
RBC: 4.39 MIL/uL (ref 4.22–5.81)
RDW: 15.6 % — ABNORMAL HIGH (ref 11.5–15.5)
WBC: 5.8 10*3/uL (ref 4.0–10.5)

## 2018-04-04 LAB — I-STAT TROPONIN, ED: Troponin i, poc: 0.15 ng/mL (ref 0.00–0.08)

## 2018-04-04 LAB — BRAIN NATRIURETIC PEPTIDE: B Natriuretic Peptide: 301.3 pg/mL — ABNORMAL HIGH (ref 0.0–100.0)

## 2018-04-04 MED ORDER — HEPARIN BOLUS VIA INFUSION
4000.0000 [IU] | Freq: Once | INTRAVENOUS | Status: AC
  Administered 2018-04-04: 4000 [IU] via INTRAVENOUS
  Filled 2018-04-04: qty 4000

## 2018-04-04 MED ORDER — ACETAMINOPHEN 325 MG PO TABS
650.0000 mg | ORAL_TABLET | ORAL | Status: DC | PRN
  Administered 2018-04-05: 650 mg via ORAL
  Filled 2018-04-04: qty 2

## 2018-04-04 MED ORDER — ASPIRIN 325 MG PO TABS: 325.0000 mg | ORAL_TABLET | Freq: Every day | ORAL | Status: DC

## 2018-04-04 MED ORDER — FLUTICASONE PROPIONATE 50 MCG/ACT NA SUSP
1.0000 | Freq: Every day | NASAL | Status: DC
  Administered 2018-04-06: 1 via NASAL
  Filled 2018-04-04: qty 16

## 2018-04-04 MED ORDER — HEPARIN (PORCINE) IN NACL 100-0.45 UNIT/ML-% IJ SOLN
1200.0000 [IU]/h | INTRAMUSCULAR | Status: DC
  Administered 2018-04-04: 950 [IU]/h via INTRAVENOUS
  Administered 2018-04-05: 1200 [IU]/h via INTRAVENOUS
  Filled 2018-04-04 (×2): qty 250

## 2018-04-04 MED ORDER — ADULT MULTIVITAMIN W/MINERALS CH
1.0000 | ORAL_TABLET | Freq: Every day | ORAL | Status: DC
  Administered 2018-04-05: 1 via ORAL
  Filled 2018-04-04: qty 1

## 2018-04-04 MED ORDER — ASPIRIN 325 MG PO TABS
ORAL_TABLET | ORAL | Status: AC
  Administered 2018-04-04: 325 mg
  Filled 2018-04-04: qty 1

## 2018-04-04 MED ORDER — ONDANSETRON HCL 4 MG/2ML IJ SOLN: 4.0000 mg | Freq: Four times a day (QID) | INTRAMUSCULAR | Status: DC | PRN

## 2018-04-04 MED ORDER — ASPIRIN 81 MG PO TABS: 81.0000 mg | ORAL_TABLET | ORAL | Status: DC | PRN

## 2018-04-04 MED ORDER — MOMETASONE FURO-FORMOTEROL FUM 200-5 MCG/ACT IN AERO
2.0000 | INHALATION_SPRAY | Freq: Two times a day (BID) | RESPIRATORY_TRACT | Status: DC
  Administered 2018-04-05 – 2018-04-07 (×4): 2 via RESPIRATORY_TRACT
  Filled 2018-04-04 (×2): qty 8.8

## 2018-04-04 MED ORDER — ATORVASTATIN CALCIUM 40 MG PO TABS
40.0000 mg | ORAL_TABLET | Freq: Every day | ORAL | Status: DC
  Administered 2018-04-05: 40 mg via ORAL
  Filled 2018-04-04 (×2): qty 1

## 2018-04-04 MED ORDER — FUROSEMIDE 10 MG/ML IJ SOLN
20.0000 mg | Freq: Once | INTRAMUSCULAR | Status: AC
  Administered 2018-04-04: 20 mg via INTRAVENOUS
  Filled 2018-04-04: qty 2

## 2018-04-04 NOTE — ED Notes (Signed)
ED Provider at bedside. 

## 2018-04-04 NOTE — ED Notes (Signed)
Pacing pads placed on patient after shaving pt.

## 2018-04-04 NOTE — ED Triage Notes (Signed)
He states he has returned from [place], where last week he attended a wedding at ~altitude of 11,000 feet. He did take some ecetazolamide and used supplemental oxygen while he was there. He is here today c/o mild shortness of breath and a slow but steady weight gain (~ 6 lbs.). He is in no distress.

## 2018-04-04 NOTE — ED Provider Notes (Signed)
[facility] DEPT Provider Note   CSN: [id number] Arrival date & time: 04/04/18  1529     History   Chief Complaint Chief Complaint  Patient presents with  . Shortness of Breath    HPI David [name] is a 72 y.o. male.  72 yo M with a cc of sob.  Going on for the past 3 to 4 days.  Started when the patient visited [place] and was at altitude.  Had some trouble breathing initially but improved while he was there.  Now is noticing worsening lower extremity edema.  Gaining about a pound a day.  Patient also feels that he can exercise as well as he had previously.  Denies increased oral intake.  He did take acetazolamide well in [place] and thinks maybe this is a side effect.  The history is provided by the patient.  Shortness of Breath  This is a new problem. The average episode lasts 4 days. The problem occurs continuously.The current episode started more than 2 days ago. The problem has been gradually worsening. Associated symptoms include leg swelling. Pertinent negatives include no fever, no headaches, no chest pain, no vomiting, no abdominal pain and no rash. He has tried nothing for the symptoms. The treatment provided no relief.    Past Medical History:  Diagnosis Date  . Asthma    exercise-induced; prn inhaler  . Rotator cuff tear, right    impingement, AC arthrosis    Patient Active Problem List   Diagnosis Date Noted  . Complete heart block ([place]) 04/04/2018  . Cough variant asthma 05/29/2016    Past Surgical History:  Procedure Laterality Date  . LUMBAR LAMINECTOMY  age 21s  . SHOULDER ARTHROSCOPY  08/06/2011   Procedure: ARTHROSCOPY SHOULDER;  Surgeon: Cammie Sickle., MD;  Location: [facility];  Service: Orthopedics;  Laterality: Right;  with subacromial decompression, arthroscopic subscapularis repair, and rotator cuff repair  . TRIGGER FINGER RELEASE Right 06/30/2014   Procedure: RELEASE A-1 PULLEY RIGHT THUMB;   Surgeon: Daryll Brod, MD;  Location: [facility];  Service: Orthopedics;  Laterality: Right;  ANESTHESIA:  IV REGIONAL FAB        Home Medications    Prior to Admission medications   Medication Sig Start Date End Date Taking? Authorizing Provider  aspirin 81 MG tablet Take 81 mg by mouth daily.      [provider]  budesonide-formoterol (SYMBICORT) 160-4.5 MCG/ACT inhaler Inhale 1 puff into the lungs 2 (two) times daily. 07/12/16   [provider]  fluticasone (FLONASE) 50 MCG/ACT nasal spray Place 2 sprays into both nostrils daily. 05/19/16   [provider]  multivitamin [facility]) per tablet Take 1 tablet by mouth daily.      [provider]    Family History No family history on file.  Social History Social History   Tobacco Use  . Smoking status: Never Smoker  . Smokeless tobacco: Never Used  Substance Use Topics  . Alcohol use: Yes    Comment: occ. beer  . Drug use: No     Allergies   Codeine and Hydrocodone   Review of Systems Review of Systems  Constitutional: Negative for chills and fever.  HENT: Negative for congestion and facial swelling.   Eyes: Negative for discharge and visual disturbance.  Respiratory: Negative for shortness of breath.   Cardiovascular: Positive for leg swelling. Negative for chest pain and palpitations.  Gastrointestinal: Negative for abdominal pain, diarrhea and vomiting.  Musculoskeletal: Negative for arthralgias and myalgias.  Skin: Negative for color change and rash.  Neurological: Negative for tremors, syncope and headaches.  Psychiatric/Behavioral: Negative for confusion and dysphoric mood.     Physical Exam Updated Vital Signs BP (!) 157/98 (BP Location: Right Arm)   Pulse 98   Temp 98 F (36.7 C) (Oral)   Resp 15   Ht 5\' 8"  (1.727 m)   Wt 78.5 kg (173 lb)   SpO2 98%   BMI 26.30 kg/m   Physical Exam  Constitutional: He is oriented to person, place, and time. He  appears well-developed and well-nourished.  HENT:  Head: Normocephalic and atraumatic.  Eyes: Pupils are equal, round, and reactive to light. EOM are normal.  Neck: Normal range of motion. Neck supple. JVD (to the angle of the jaw) present.  Cardiovascular: An irregular rhythm present. Bradycardia present. Exam reveals no gallop and no friction rub.  No murmur heard. Pulmonary/Chest: No respiratory distress. He has no wheezes.  Abdominal: He exhibits no distension and no mass. There is no tenderness. There is no rebound and no guarding.  Musculoskeletal: Normal range of motion. He exhibits edema (2+ to the ankles bilaterally).  R slightly worse than L  Neurological: He is alert and oriented to person, place, and time.  Skin: No rash noted. No pallor.  Psychiatric: He has a normal mood and affect. His behavior is normal.  Nursing note and vitals reviewed.    ED Treatments / Results  Labs (all labs ordered are listed, but only abnormal results are displayed) Labs Reviewed  CBC WITH DIFFERENTIAL/PLATELET - Abnormal; Notable for the following components:      Result Value   RDW 15.6 (*)    All other components within normal limits  BRAIN NATRIURETIC PEPTIDE  COMPREHENSIVE METABOLIC PANEL  I-STAT TROPONIN, ED    EKG EKG Interpretation  Date/Time:  Saturday April 04 2018 15:53:22 EDT Ventricular Rate:  50 PR Interval:    QRS Duration: 85 QT Interval:  462 QTC Calculation: 422 R Axis:   77 Text Interpretation:  Sinus rhythm Short PR interval Biatrial enlargement Mobitz I 2-degree AV block (Wenckebach block) No old tracing to compare Confirmed by Deno Etienne [phone]) on 04/04/2018 4:04:41 PM   EKG Interpretation  Date/Time:  Saturday April 04 2018 21:02:31 EDT Ventricular Rate:  33 PR Interval:    QRS Duration: 97 QT Interval:  517 QTC Calculation: 383 R Axis:   80 Text Interpretation:  AV block, complete (third degree) Atrial premature complex Nonspecific T abnrm,  anterolateral leads Confirmed by Deno Etienne [phone]) on 04/04/2018 9:54:17 PM        Radiology Dg Chest 2 View  Result Date: 04/04/2018 CLINICAL DATA:  He states he has returned from [place], where last week he attended a [place]. He did take some acetazolamide and used supplemental oxygen while he was there. He is here today c/o mild shortness of breath. Slow but steady weight gain of 6 pounds. EXAM: CHEST - 2 VIEW COMPARISON:  10/28/2008, 10/04/2006 FINDINGS: The heart is mildly enlarged. There are no focal consolidations or pleural effusions. No pulmonary edema. Degenerative changes are seen in thoracic spine. IMPRESSION: Mild cardiomegaly.  No edema. Electronically Signed   By: Nolon Nations M.D.   On: 04/04/2018 16:10    Procedures Procedures (including critical care time)  Medications Ordered in ED Medications - No data to display   Initial Impression / Assessment and Plan / ED Course  I  have reviewed the triage vital signs and the nursing notes.  Pertinent labs & imaging results that were available during my care of the patient were reviewed by me and considered in my medical decision making (see chart for details).     72 yo M with a chief complaint of shortness of breath.  This been going on for the past week.  Started with a trip to [place].  His initial EKG looks like he is in [place], he was significantly bradycardic on my exam with a heart rate in the 30s.  On repeat he is in complete heart block.  Discussed with the cardiologist who recommends admission over at [facility] in the ICU.  CRITICAL CARE Performed by: Cecilio Asper   Total critical care time: 35 minutes  Critical care time was exclusive of separately billable procedures and treating other patients.  Critical care was necessary to treat or prevent imminent or life-threatening deterioration.  Critical care was time spent personally by me on the following activities:  development of treatment plan with patient and/or surrogate as well as nursing, discussions with consultants, evaluation of patient's response to treatment, examination of patient, obtaining history from patient or surrogate, ordering and performing treatments and interventions, ordering and review of laboratory studies, ordering and review of radiographic studies, pulse oximetry and re-evaluation of patient's condition.  The patients results and plan were reviewed and discussed.   Any x-rays performed were independently reviewed by myself.   Differential diagnosis were considered with the presenting HPI.  Medications - No data to display  Vitals:   04/04/18 1535 04/04/18 1754 04/04/18 2059  BP: (!) 147/86 (!) 137/95 (!) 157/98  Pulse: 68 100 98  Resp: 16 16 15   Temp: 97.8 F (36.6 C) 97.7 F (36.5 C) 98 F (36.7 C)  TempSrc: Oral Oral Oral  SpO2: 96% 98% 98%  Weight: 78.5 kg (173 lb)    Height: 5\' 8"  (1.727 m)      Final diagnoses:  Complete heart block (HCC)    Admission/ observation were discussed with the admitting physician, patient and/or family and they are comfortable with the plan.    Final Clinical Impressions(s) / ED Diagnoses   Final diagnoses:  Complete heart block [facility])    ED Discharge Orders    None       Deno Etienne, DO 04/04/18 2303

## 2018-04-04 NOTE — Progress Notes (Signed)
ANTICOAGULATION CONSULT NOTE - Initial Consult  Pharmacy Consult for Heparin Indication: chest pain/ACS  Allergies  Allergen Reactions  . Codeine Itching  . Hydrocodone Itching    Patient Measurements: Height: 5\' 8"  (172.7 cm) Weight: 173 lb (78.5 kg) IBW/kg (Calculated) : 68.4  Vital Signs: Temp: 98 F (36.7 C) (08/03 2059) Temp Source: Oral (08/03 2059) BP: 142/98 (08/03 2229) Pulse Rate: 33 (08/03 2230)  Labs: Recent Labs    04/04/18 2139  HGB 14.0  HCT 42.2  PLT 190  CREATININE 1.11    Estimated Creatinine Clearance: 59.1 mL/min (by C-G formula based on SCr of 1.11 mg/dL).   Medical History: Past Medical History:  Diagnosis Date  . Asthma    exercise-induced; prn inhaler  . Rotator cuff tear, right    impingement, AC arthrosis    Medications:  No current facility-administered medications on file prior to encounter.    Current Outpatient Medications on File Prior to Encounter  Medication Sig Dispense Refill  . aspirin 81 MG tablet Take 81 mg by mouth as needed (blood thinner).     . budesonide-formoterol (SYMBICORT) 160-4.5 MCG/ACT inhaler Inhale 1 puff into the lungs 2 (two) times daily.    . fluticasone (FLONASE) 50 MCG/ACT nasal spray Place 1 spray into both nostrils daily.    . multivitamin (THERAGRAN) per tablet Take 1 tablet by mouth daily.      . ACETAZOLAMIDE PO Take 1 tablet by mouth 2 (two) times daily.    . [DISCONTINUED] fluticasone (FLONASE) 50 MCG/ACT nasal spray Place 2 sprays into both nostrils daily.       Assessment: 72 y.o. male with SOB/complete heart block, possible ACS, for heparin Goal of Therapy:  Heparin level 0.3-0.7 units/ml Monitor platelets by anticoagulation protocol: Yes   Plan:  Heparin 4000 units IV bolus, then start heparin 950 units/hr Check heparin level in 8 hours.   Caryl Pina 04/04/2018,11:18 PM

## 2018-04-04 NOTE — ED Notes (Signed)
ED TO INPATIENT HANDOFF REPORT  Name/Age/Gender David [name] 72 y.o. male  Code Status   Home/SNF/Other Home  Chief Complaint Swelling of Ankles and Feet   Level of Care/Admitting Diagnosis ED Disposition    ED Disposition Condition [facility] Area: [place] [100100]  Level of Care: ICU [6]  Diagnosis: Complete heart block [facility]) [[id number]]  Admitting Physician: Nila Nephew [[id number]]  Attending Physician: Nila Nephew [[id number]]  Estimated length of stay: past midnight tomorrow  Certification:: I certify this patient will need inpatient services for at least 2 midnights  PT Class (Do Not Modify): Inpatient [101]  PT Acc Code (Do Not Modify): Private [1]       Medical History Past Medical History:  Diagnosis Date  . Asthma    exercise-induced; prn inhaler  . Rotator cuff tear, right    impingement, AC arthrosis    Allergies Allergies  Allergen Reactions  . Codeine Itching  . Hydrocodone Itching    IV Location/Drains/Wounds Patient Lines/Drains/Airways Status   Active Line/Drains/Airways    Name:   Placement date:   Placement time:   Site:   Days:   Incision 08/06/11 Shoulder Right   08/06/11    1436     2433   Incision (Closed) 06/30/14 Finger (Comment which one) Right   06/30/14    1206     1374          Labs/Imaging Results for orders placed or performed during the hospital encounter of 04/04/18 (from the past 48 hour(s))  CBC with Differential     Status: Abnormal   Collection Time: 04/04/18  9:39 PM  Result Value Ref Range   WBC 5.8 4.0 - 10.5 K/uL   RBC 4.39 4.22 - 5.81 MIL/uL   Hemoglobin 14.0 13.0 - 17.0 g/dL   HCT 42.2 39.0 - 52.0 %   MCV 96.1 78.0 - 100.0 fL   MCH 31.9 26.0 - 34.0 pg   MCHC 33.2 30.0 - 36.0 g/dL   RDW 15.6 (H) 11.5 - 15.5 %   Platelets 190 150 - 400 K/uL   Neutrophils Relative % 52 %   Neutro Abs 3.0 1.7 - 7.7 K/uL   Lymphocytes Relative 32 %   Lymphs Abs 1.9 0.7 - 4.0 K/uL   Monocytes Relative 9 %   Monocytes Absolute 0.5 0.1 - 1.0 K/uL   Eosinophils Relative 7 %   Eosinophils Absolute 0.4 0.0 - 0.7 K/uL   Basophils Relative 0 %   Basophils Absolute 0.0 0.0 - 0.1 K/uL    Comment: Performed at [facility], [place] [address], [place], [place] [address]   Dg Chest 2 View  Result Date: 04/04/2018 CLINICAL DATA:  He states he has returned from [place], where last week he attended a wedding [place]. He did take some acetazolamide and used supplemental oxygen while he was there. He is here today c/o mild shortness of breath. Slow but steady weight gain of 6 pounds. EXAM: CHEST - 2 VIEW COMPARISON:  10/28/2008, 10/04/2006 FINDINGS: The heart is mildly enlarged. There are no focal consolidations or pleural effusions. No pulmonary edema. Degenerative changes are seen in thoracic spine. IMPRESSION: Mild cardiomegaly.  No edema. Electronically Signed   By: Nolon Nations M.D.   On: 04/04/2018 16:10    Pending Labs Unresulted Labs (From admission, onward)   Start     Ordered   04/04/18 2120  Comprehensive metabolic panel  STAT,  STAT     04/04/18 2119   04/04/18 2056  Brain natriuretic peptide  Once,   R     04/04/18 2055      Vitals/Pain Today's Vitals   04/04/18 1535 04/04/18 1550 04/04/18 1754 04/04/18 2059  BP: (!) 147/86  (!) 137/95 (!) 157/98  Pulse: 68  100 98  Resp: 16  16 15   Temp: 97.8 F (36.6 C)  97.7 F (36.5 C) 98 F (36.7 C)  TempSrc: Oral  Oral Oral  SpO2: 96%  98% 98%  Weight: 173 lb (78.5 kg)     Height: 5\' 8"  (1.727 m)     PainSc:  0-No pain      Isolation Precautions No active isolations  Medications Medications - No data to display  Mobility walks

## 2018-04-04 NOTE — ED Notes (Signed)
[facility] dispatch notified for need of transport.  

## 2018-04-04 NOTE — H&P (Signed)
Cardiology Admission History and Physical:   Patient ID: David [name]; MRN: [id number]; DOB: 1946-05-03   Admission date: 04/04/2018  Primary Care Provider: Wayland Salinas, MD Primary Cardiologist: None  Chief Complaint:  Dyspnea  Patient Profile:   David [name] is a 72 y.o. male with a history of asthma who presented to the ED with dyspnea and is found to have bradycardia.   History of Present Illness:   David [name] is a 72 y.o. male with a history of asthma who presented to the ED with dyspnea and is found to have bradycardia.   The patient is healthy at baseline and has no known cardiac history. He visited [place] several days ago and had some dyspnea there due to the altitude. He took acetazolamide without significant benefit. His dyspnea continued to worsen after coming home. He has also had LE edema and 11 lbs weight gain. He also reports some nonspecific discomfort in his chest and shoulder that he has experienced with exercise. He denies any syncope or significant lightheadedness or other symptoms. At baseline, he is very active and goes to the gym several times per week.   He presented to the outside ED, where he had HR 30s and SBP 130-160s. Labs were notable for troponin 0.15 and BNP 301. He underwent ECG that showed bradycardia to the 30s. Interpretation from outside provider was concerning for complete heart block. He was transferred to [facility] for further management.  On my evaluation, the patient is resting comfortably in bed. He denies any active complaints. His telemetry was reviewed and showed regular P waves with irregular R-R intervals with ventricular rate in the 30s with apparent Weincheback pattern.   Past Medical History:  Diagnosis Date  . Asthma    exercise-induced; prn inhaler  . Rotator cuff tear, right    impingement, AC arthrosis    Past Surgical History:  Procedure Laterality Date  . LUMBAR LAMINECTOMY  age 66s  . SHOULDER ARTHROSCOPY   08/06/2011   Procedure: ARTHROSCOPY SHOULDER;  Surgeon: Cammie Sickle., MD;  Location: [facility];  Service: Orthopedics;  Laterality: Right;  with subacromial decompression, arthroscopic subscapularis repair, and rotator cuff repair  . TRIGGER FINGER RELEASE Right 06/30/2014   Procedure: RELEASE A-1 PULLEY RIGHT THUMB;  Surgeon: Daryll Brod, MD;  Location: [place] [facility] [place];  Service: Orthopedics;  Laterality: Right;  ANESTHESIA:  IV REGIONAL FAB     Medications Prior to Admission: Prior to Admission medications   Medication Sig Start Date End Date Taking? Authorizing Provider  aspirin 81 MG tablet Take 81 mg by mouth as needed (blood thinner).    Yes [provider]  budesonide-formoterol (SYMBICORT) 160-4.5 MCG/ACT inhaler Inhale 1 puff into the lungs 2 (two) times daily. 07/12/16  Yes [provider]  fluticasone (FLONASE) 50 MCG/ACT nasal spray Place 1 spray into both nostrils daily.   Yes [provider]  multivitamin [facility]) per tablet Take 1 tablet by mouth daily.     Yes [provider]  ACETAZOLAMIDE PO Take 1 tablet by mouth 2 (two) times daily.    [provider]     Allergies:    Allergies  Allergen Reactions  . Codeine Itching  . Hydrocodone Itching    Social History:   Social History   Socioeconomic History  . Marital status: Married    Spouse name: Not on file  . Number of children: Not on file  . Years of education: Not on file  .  Highest education level: Not on file  Occupational History  . Not on file  Social Needs  . Financial resource strain: Not on file  . Food insecurity:    Worry: Not on file    Inability: Not on file  . Transportation needs:    Medical: Not on file    Non-medical: Not on file  Tobacco Use  . Smoking status: Never Smoker  . Smokeless tobacco: Never Used  Substance and Sexual Activity  . Alcohol use: Yes    Comment: occ. beer  . Drug use: No  . Sexual  activity: Not on file  Lifestyle  . Physical activity:    Days per week: Not on file    Minutes per session: Not on file  . Stress: Not on file  Relationships  . Social connections:    Talks on phone: Not on file    Gets together: Not on file    Attends religious service: Not on file    Active member of club or organization: Not on file    Attends meetings of clubs or organizations: Not on file    Relationship status: Not on file  . Intimate partner violence:    Fear of current or ex partner: Not on file    Emotionally abused: Not on file    Physically abused: Not on file    Forced sexual activity: Not on file  Other Topics Concern  . Not on file  Social History Narrative  . Not on file    Family History:  Reports pacemaker in mother with no other cardiac history in family  ROS:  Please see the history of present illness.  All other ROS reviewed and negative.     Physical Exam/Data:   Vitals:   04/04/18 2200 04/04/18 2215 04/04/18 2229 04/04/18 2230  BP:   (!) 142/98   Pulse: (!) 33 (!) 115 (!) 43 (!) 33  Resp: 17 18 16  (!) 21  Temp:      TempSrc:      SpO2: 99% 97% 97% 96%  Weight:      Height:       No intake or output data in the 24 hours ending 04/04/18 2311 Filed Weights   04/04/18 1535  Weight: 78.5 kg (173 lb)   Body mass index is 26.3 kg/m.  General:  Well nourished, well developed, in no acute distress HEENT: normal Lymph: no adenopathy Neck: JVD at jaw Cardiac:  Bradycardic and irregular without significant murmur Lungs:  clear to auscultation bilaterally, no wheezing, rhonchi or rales  Abd: soft, nontender Ext: 1-2+ pitting LE edema Musculoskeletal:  No deformities, BUE and BLE strength normal and equal Skin: warm and dry  Neuro:  No focal abnormalities noted Psych:  Normal affect    EKG:  The ECG that was done and was personally reviewed and demonstrates bradycardia with  Probable Mobitz type 1 and nonspecific ST changes.   Relevant CV  Studies: None  Laboratory Data:  Chemistry Recent Labs  Lab 04/04/18 2139  NA 141  K 4.1  CL 106  CO2 26  GLUCOSE 86  BUN 22  CREATININE 1.11  CALCIUM 8.9  GFRNONAA >60  GFRAA >60  ANIONGAP 9    Recent Labs  Lab 04/04/18 2139  PROT 6.6  ALBUMIN 3.9  AST 49*  ALT 53*  ALKPHOS 56  BILITOT 0.6   Hematology Recent Labs  Lab 04/04/18 2139  WBC 5.8  RBC 4.39  HGB 14.0  HCT 42.2  MCV 96.1  MCH 31.9  MCHC 33.2  RDW 15.6*  PLT 190   Cardiac EnzymesNo results for input(s): TROPONINI in the last 168 hours.  Recent Labs  Lab 04/04/18 2142  TROPIPOC 0.15*    BNP Recent Labs  Lab 04/04/18 2139  BNP 301.3*    DDimer No results for input(s): DDIMER in the last 168 hours.  Radiology/Studies:  Dg Chest 2 View  Result Date: 04/04/2018 CLINICAL DATA:  He states he has returned from [place], where last week he attended a [place]. He did take some acetazolamide and used supplemental oxygen while he was there. He is here today c/o mild shortness of breath. Slow but steady weight gain of 6 pounds. EXAM: CHEST - 2 VIEW COMPARISON:  10/28/2008, 10/04/2006 FINDINGS: The heart is mildly enlarged. There are no focal consolidations or pleural effusions. No pulmonary edema. Degenerative changes are seen in thoracic spine. IMPRESSION: Mild cardiomegaly.  No edema. Electronically Signed   By: Nolon Nations M.D.   On: 04/04/2018 16:10    Assessment and Plan:   Bradycardia The patient was transferred to [place] due to concern for complete heart block. Review of telemetry shows findings that are more likely consistent with Weincheback, although with significant bradycardia. It is unclear if he may also have higher grade AV block and/or symptomatic bradycardia, although he does not endorse any syncope/presyncope to me. The cause of his bradyarrhythmia is also unclear but may be due to his apparent heart failure symptoms, possible NSTEMI,  discussed below. At this time will continue to monitor on telemetry and initiate basic workup. Based on clinical trajectory, the needs for management of his bradycardia can be determined. -Continue to monitor on telemetry -Avoid nodal blockade.  Suspected heart failure Elevated troponin The patient presented to the ED for progressive dyspnea and weight gain. He has elevated BNP and shows signs of hypervolemia on exam. His presentation is suggestive of heart failure. The cause of this apparent heart failure is not clear. He does have mildly elevated troponin and some nonspecific ST changes on ECG. He also describes some nonspecific exertional chest discomfort with exertion. Therefore, ACS/NSTEMI is possible. Also, it is unclear to what degree this constellation of symptoms may be causing his bradycardia, although nodal ischemia from coronary disease is possible.  -Continue to trend troponin  -Ordered ASA 325 and 81 mg daily -Starting heparin gtt -Atorvastatin ordered -IV lasix ordered for hypervolemia. Redose based on clinical response.  -Echocardiogram ordered -Lipid panel, HgA1c ordered -NPO  Asthma -Continue home inhaler  Severity of Illness: The appropriate patient status for this patient is INPATIENT. Inpatient status is judged to be reasonable and necessary in order to provide the required intensity of service to ensure the patient's safety. The patient's presenting symptoms, physical exam findings, and initial radiographic and laboratory data in the context of their chronic comorbidities is felt to place them at high risk for further clinical deterioration. Furthermore, it is not anticipated that the patient will be medically stable for discharge from the hospital within 2 midnights of admission. The following factors support the patient status of inpatient.   " The patient's presenting symptoms include dyspnea. " The worrisome physical exam findings include edema. " The initial  radiographic and laboratory data are worrisome because of bradycardia. " The chronic co-morbidities include asthma.   * I certify that at the point of admission it is my clinical judgment that the patient will require inpatient hospital care spanning  beyond 2 midnights from the point of admission due to high intensity of service, high risk for further deterioration and high frequency of surveillance required.*    For questions or updates, please contact [place] Please consult [email or web address] for contact info under Cardiology/STEMI.    Signed, Nila Nephew, MD  04/04/2018 11:11 PM

## 2018-04-05 ENCOUNTER — Inpatient Hospital Stay (HOSPITAL_COMMUNITY): Payer: Medicare Other

## 2018-04-05 ENCOUNTER — Encounter (HOSPITAL_COMMUNITY): Payer: Self-pay

## 2018-04-05 ENCOUNTER — Other Ambulatory Visit: Payer: Self-pay

## 2018-04-05 DIAGNOSIS — I441 Atrioventricular block, second degree: Secondary | ICD-10-CM

## 2018-04-05 LAB — ECHOCARDIOGRAM COMPLETE
Height: 68 in
Weight: 2768.98 oz

## 2018-04-05 LAB — BASIC METABOLIC PANEL
Anion gap: 13 (ref 5–15)
BUN: 16 mg/dL (ref 8–23)
CO2: 23 mmol/L (ref 22–32)
Calcium: 8.5 mg/dL — ABNORMAL LOW (ref 8.9–10.3)
Chloride: 105 mmol/L (ref 98–111)
Creatinine, Ser: 1.09 mg/dL (ref 0.61–1.24)
GFR calc Af Amer: >60 mL/min (ref >60)
GFR calc non Af Amer: >60 mL/min (ref >60)
Glucose, Bld: 79 mg/dL (ref 70–99)
Potassium: 3.9 mmol/L (ref 3.5–5.1)
Sodium: 141 mmol/L (ref 135–145)

## 2018-04-05 LAB — HEPARIN LEVEL (UNFRACTIONATED)
Heparin Unfractionated: 0.19 IU/mL — ABNORMAL LOW (ref 0.30–0.70)
Heparin Unfractionated: 0.5 IU/mL (ref 0.30–0.70)

## 2018-04-05 LAB — CBC
HCT: 43.6 % (ref 39.0–52.0)
Hemoglobin: 14.5 g/dL (ref 13.0–17.0)
MCH: 31.8 pg (ref 26.0–34.0)
MCHC: 33.3 g/dL (ref 30.0–36.0)
MCV: 95.6 fL (ref 78.0–100.0)
Platelets: 168 10*3/uL (ref 150–400)
RBC: 4.56 MIL/uL (ref 4.22–5.81)
RDW: 15.2 % (ref 11.5–15.5)
WBC: 5.4 10*3/uL (ref 4.0–10.5)

## 2018-04-05 LAB — TROPONIN I
Troponin I: 0.48 ng/mL (ref <0.03)
Troponin I: 0.41 ng/mL (ref <0.03)
Troponin I: 0.45 ng/mL (ref <0.03)

## 2018-04-05 LAB — LIPID PANEL
Cholesterol: 141 mg/dL (ref 0–200)
HDL: 37 mg/dL — ABNORMAL LOW (ref >40)
LDL Cholesterol: 99 mg/dL (ref 0–99)
Total CHOL/HDL Ratio: 3.8 RATIO
Triglycerides: 27 mg/dL (ref <150)
VLDL: 5 mg/dL (ref 0–40)

## 2018-04-05 LAB — MRSA PCR SCREENING: MRSA by PCR: NEGATIVE

## 2018-04-05 LAB — TSH: TSH: 2.137 u[IU]/mL (ref 0.350–4.500)

## 2018-04-05 LAB — HEMOGLOBIN A1C
Hgb A1c MFr Bld: 5.4 % (ref 4.8–5.6)
Mean Plasma Glucose: 108.28 mg/dL

## 2018-04-05 LAB — MAGNESIUM: Magnesium: 2 mg/dL (ref 1.7–2.4)

## 2018-04-05 MED ORDER — HEPARIN BOLUS VIA INFUSION
2000.0000 [IU] | Freq: Once | INTRAVENOUS | Status: AC
  Administered 2018-04-05: 2000 [IU] via INTRAVENOUS
  Filled 2018-04-05: qty 2000

## 2018-04-05 MED ORDER — IOPAMIDOL (ISOVUE-370) INJECTION 76%
100.0000 mL | Freq: Once | INTRAVENOUS | Status: AC | PRN
  Administered 2018-04-05: 100 mL via INTRAVENOUS

## 2018-04-05 MED ORDER — FUROSEMIDE 10 MG/ML IJ SOLN
20.0000 mg | Freq: Once | INTRAMUSCULAR | Status: AC
  Administered 2018-04-05: 20 mg via INTRAVENOUS
  Filled 2018-04-05: qty 2

## 2018-04-05 MED ORDER — ASPIRIN 81 MG PO CHEW
81.0000 mg | CHEWABLE_TABLET | Freq: Every day | ORAL | Status: DC
  Administered 2018-04-05 – 2018-04-06 (×2): 81 mg via ORAL
  Filled 2018-04-05 (×2): qty 1

## 2018-04-05 MED ORDER — IBUPROFEN 200 MG PO TABS
200.0000 mg | ORAL_TABLET | Freq: Once | ORAL | Status: AC
  Administered 2018-04-06: 200 mg via ORAL
  Filled 2018-04-05: qty 1

## 2018-04-05 NOTE — H&P (View-Only) (Signed)
Progress Note  Patient Name: David [name] Date of Encounter: 04/05/2018  Primary Cardiologist: No primary care provider on file.   Subjective   No syncope, no dizziness.  Heart rates have been in the 30s and 40s.  No recent rashes, joint swelling, no chest pain  Inpatient Medications    Scheduled Meds: . aspirin  81 mg Oral Daily  . atorvastatin  40 mg Oral q1800  . fluticasone  1 spray Each Nare Daily  . mometasone-formoterol  2 puff Inhalation BID  . multivitamin with minerals  1 tablet Oral Daily   Continuous Infusions: . heparin 950 Units/hr (04/05/18 0600)   PRN Meds: acetaminophen, ondansetron (ZOFRAN) IV   Vital Signs    Vitals:   04/05/18 0400 04/05/18 0500 04/05/18 0600 04/05/18 0804  BP: 110/76 122/77 103/68   Pulse: (!) 39 (!) 30 (!) 138   Resp: 13 15 (!) 21   Temp: 98.5 F (36.9 C)   98.6 F (37 C)  TempSrc: Oral   Oral  SpO2: 95% 92% 93%   Weight: 173 lb 1 oz (78.5 kg)     Height: 5\' 8"  (1.727 m)       Intake/Output Summary (Last 24 hours) at 04/05/2018 0809 Last data filed at 04/05/2018 0600 Gross per 24 hour  Intake 219.71 ml  Output 2200 ml  Net -1980.29 ml   Filed Weights   04/04/18 1535 04/05/18 0400  Weight: 173 lb (78.5 kg) 173 lb 1 oz (78.5 kg)    Telemetry    Still bradycardic.  Heart rate currently 45, secondary heart block type I. - Personally Reviewed  ECG    Secondary heart block type I heart rate 45 bpm no ST segment changes- Personally Reviewed  Physical Exam   GEN: No acute distress.   Neck: No JVD Cardiac:  Bradycardia irregularly irregular, no murmurs, rubs, or gallops.  Respiratory: Clear to auscultation bilaterally. GI: Soft, nontender, non-distended  MS: No edema; No deformity. Neuro:  Nonfocal  Psych: Normal affect, very talkative  Labs    Chemistry Recent Labs  Lab 04/04/18 2139  NA 141  K 4.1  CL 106  CO2 26  GLUCOSE 86  BUN 22  CREATININE 1.11  CALCIUM 8.9  PROT 6.6  ALBUMIN 3.9  AST 49*  ALT  53*  ALKPHOS 56  BILITOT 0.6  GFRNONAA >60  GFRAA >60  ANIONGAP 9     Hematology Recent Labs  Lab 04/04/18 2139  WBC 5.8  RBC 4.39  HGB 14.0  HCT 42.2  MCV 96.1  MCH 31.9  MCHC 33.2  RDW 15.6*  PLT 190    Cardiac Enzymes Recent Labs  Lab 04/05/18 0019  TROPONINI 0.48*    Recent Labs  Lab 04/04/18 2142  TROPIPOC 0.15*     BNP Recent Labs  Lab 04/04/18 2139  BNP 301.3*     DDimer No results for input(s): DDIMER in the last 168 hours.   Radiology    Dg Chest 2 View  Result Date: 04/04/2018 CLINICAL DATA:  He states he has returned from [place], where last week he attended a [place]. He did take some acetazolamide and used supplemental oxygen while he was there. He is here today c/o mild shortness of breath. Slow but steady weight gain of 6 pounds. EXAM: CHEST - 2 VIEW COMPARISON:  10/28/2008, 10/04/2006 FINDINGS: The heart is mildly enlarged. There are no focal consolidations or pleural effusions. No pulmonary edema. Degenerative changes are seen  in thoracic spine. IMPRESSION: Mild cardiomegaly.  No edema. Electronically Signed   By: Nolon Nations M.D.   On: 04/04/2018 16:10    Cardiac Studies   Awaiting echocardiogram, cardiac catheterization tomorrow  Patient Profile     72 y.o. male who presented with second-degree heart block type I, heart rate in the 30s with normal systolic blood pressures, elevated troponin with recent dyspnea thought to be secondary to altitude in [place], no benefit with Diamox.  Assessment & Plan    Second degree heart block type I -Initial concern was for complete heart block.  Review of telemetry shows second-degree heart block type I although significantly bradycardic.  He may have had higher grade AV block previously.  No syncope.  He does have mild cardiomegaly on chest x-ray.  Question the etiology behind his conduction abnormality.  For instance, could a high-grade proximal RCA lesion be  present.  Of course, avoiding AV nodal blocking.  No ST segment changes on ECG. - He exercises very vigorously.  At his last session he was able to get his heart rate up to 120 but interestingly, originally his heart monitor was originally registering a bradycardic heart rate, likely similar to where he is currently and not increasing.  He said he change the monitor because he thought it was broken and his heart rate was able to be registering 120. -He denies any diarrhea weight loss hair changes skin changes fatigue.  His only main symptom is some shortness of breath with activity as well as ankle swelling, gradual weight gain since coming back from [place] a few days ago.  We will give an additional dose of Lasix 20 mg IV. -Continue to monitor him here in the CCU in case his heart rate deteriorates. Discussed case with Dr. Rayann Heman.  We will walk him around CCU to see how his heart rate changes.  Elevated troponin currently 0.48 -Question if this is secondary to cardiomyopathy.  BNP is elevated at 301.  Mild cardiomegaly noted.  Awaiting echocardiogram.  May be prudent to perform cardiac catheterization tomorrow to exclude coronary artery disease.  Currently on heparin drip.  IV Lasix was ordered.  Aspirin.  Mildly elevated LFTs -Continue to monitor.  Could be hepatic congestion.      For questions or updates, please contact [place] Please consult [email or web address] for contact info under Cardiology/STEMI.      Signed, Candee Furbish, MD  04/05/2018, 8:09 AM

## 2018-04-05 NOTE — Progress Notes (Signed)
CRITICAL VALUE ALERT  Critical Value:  Troponin 0.48  Date & Time Notied: 04/05/18 0200  Provider Notified: Rhae Hammock  Orders Received/Actions taken: MD made aware. Pt remains asymptomatic. No new orders received.

## 2018-04-05 NOTE — Progress Notes (Signed)
Progress Note  Patient Name: David [name] Date of Encounter: 04/05/2018  Primary Cardiologist: No primary care provider on file.   Subjective   No syncope, no dizziness.  Heart rates have been in the 30s and 40s.  No recent rashes, joint swelling, no chest pain  Inpatient Medications    Scheduled Meds: . aspirin  81 mg Oral Daily  . atorvastatin  40 mg Oral q1800  . fluticasone  1 spray Each Nare Daily  . mometasone-formoterol  2 puff Inhalation BID  . multivitamin with minerals  1 tablet Oral Daily   Continuous Infusions: . heparin 950 Units/hr (04/05/18 0600)   PRN Meds: acetaminophen, ondansetron (ZOFRAN) IV   Vital Signs    Vitals:   04/05/18 0400 04/05/18 0500 04/05/18 0600 04/05/18 0804  BP: 110/76 122/77 103/68   Pulse: (!) 39 (!) 30 (!) 138   Resp: 13 15 (!) 21   Temp: 98.5 F (36.9 C)   98.6 F (37 C)  TempSrc: Oral   Oral  SpO2: 95% 92% 93%   Weight: 173 lb 1 oz (78.5 kg)     Height: 5\' 8"  (1.727 m)       Intake/Output Summary (Last 24 hours) at 04/05/2018 0809 Last data filed at 04/05/2018 0600 Gross per 24 hour  Intake 219.71 ml  Output 2200 ml  Net -1980.29 ml   Filed Weights   04/04/18 1535 04/05/18 0400  Weight: 173 lb (78.5 kg) 173 lb 1 oz (78.5 kg)    Telemetry    Still bradycardic.  Heart rate currently 45, secondary heart block type I. - Personally Reviewed  ECG    Secondary heart block type I heart rate 45 bpm no ST segment changes- Personally Reviewed  Physical Exam   GEN: No acute distress.   Neck: No JVD Cardiac:  Bradycardia irregularly irregular, no murmurs, rubs, or gallops.  Respiratory: Clear to auscultation bilaterally. GI: Soft, nontender, non-distended  MS: No edema; No deformity. Neuro:  Nonfocal  Psych: Normal affect, very talkative  Labs    Chemistry Recent Labs  Lab 04/04/18 2139  NA 141  K 4.1  CL 106  CO2 26  GLUCOSE 86  BUN 22  CREATININE 1.11  CALCIUM 8.9  PROT 6.6  ALBUMIN 3.9  AST 49*  ALT  53*  ALKPHOS 56  BILITOT 0.6  GFRNONAA >60  GFRAA >60  ANIONGAP 9     Hematology Recent Labs  Lab 04/04/18 2139  WBC 5.8  RBC 4.39  HGB 14.0  HCT 42.2  MCV 96.1  MCH 31.9  MCHC 33.2  RDW 15.6*  PLT 190    Cardiac Enzymes Recent Labs  Lab 04/05/18 0019  TROPONINI 0.48*    Recent Labs  Lab 04/04/18 2142  TROPIPOC 0.15*     BNP Recent Labs  Lab 04/04/18 2139  BNP 301.3*     DDimer No results for input(s): DDIMER in the last 168 hours.   Radiology    Dg Chest 2 View  Result Date: 04/04/2018 CLINICAL DATA:  He states he has returned from [place], where last week he attended a [place]. He did take some acetazolamide and used supplemental oxygen while he was there. He is here today c/o mild shortness of breath. Slow but steady weight gain of 6 pounds. EXAM: CHEST - 2 VIEW COMPARISON:  10/28/2008, 10/04/2006 FINDINGS: The heart is mildly enlarged. There are no focal consolidations or pleural effusions. No pulmonary edema. Degenerative changes are seen  in thoracic spine. IMPRESSION: Mild cardiomegaly.  No edema. Electronically Signed   By: Nolon Nations M.D.   On: 04/04/2018 16:10    Cardiac Studies   Awaiting echocardiogram, cardiac catheterization tomorrow  Patient Profile     72 y.o. male who presented with second-degree heart block type I, heart rate in the 30s with normal systolic blood pressures, elevated troponin with recent dyspnea thought to be secondary to altitude in [place], no benefit with Diamox.  Assessment & Plan    Second degree heart block type I -Initial concern was for complete heart block.  Review of telemetry shows second-degree heart block type I although significantly bradycardic.  He may have had higher grade AV block previously.  No syncope.  He does have mild cardiomegaly on chest x-ray.  Question the etiology behind his conduction abnormality.  For instance, could a high-grade proximal RCA lesion be  present.  Of course, avoiding AV nodal blocking.  No ST segment changes on ECG. - He exercises very vigorously.  At his last session he was able to get his heart rate up to 120 but interestingly, originally his heart monitor was originally registering a bradycardic heart rate, likely similar to where he is currently and not increasing.  He said he change the monitor because he thought it was broken and his heart rate was able to be registering 120. -He denies any diarrhea weight loss hair changes skin changes fatigue.  His only main symptom is some shortness of breath with activity as well as ankle swelling, gradual weight gain since coming back from [place] a few days ago.  We will give an additional dose of Lasix 20 mg IV. -Continue to monitor him here in the CCU in case his heart rate deteriorates. Discussed case with Dr. Rayann Heman.  We will walk him around CCU to see how his heart rate changes.  Elevated troponin currently 0.48 -Question if this is secondary to cardiomyopathy.  BNP is elevated at 301.  Mild cardiomegaly noted.  Awaiting echocardiogram.  May be prudent to perform cardiac catheterization tomorrow to exclude coronary artery disease.  Currently on heparin drip.  IV Lasix was ordered.  Aspirin.  Mildly elevated LFTs -Continue to monitor.  Could be hepatic congestion.      For questions or updates, please contact [place] Please consult [email or web address] for contact info under Cardiology/STEMI.      Signed, Candee Furbish, MD  04/05/2018, 8:09 AM

## 2018-04-05 NOTE — Progress Notes (Signed)
  Echocardiogram 2D Echocardiogram has been performed.  David [name] 04/05/2018, 10:49 AM

## 2018-04-05 NOTE — Progress Notes (Signed)
[facility] [place] for Heparin Indication: chest pain/ACS  Allergies  Allergen Reactions  . Codeine Itching  . Hydrocodone Itching    Patient Measurements: Height: 5\' 8"  (172.7 cm) Weight: 173 lb 1 oz (78.5 kg) IBW/kg (Calculated) : 68.4  Vital Signs: Temp: 98.1 F (36.7 C) (08/04 1559) Temp Source: Oral (08/04 1559) BP: 119/86 (08/04 1800) Pulse Rate: 65 (08/04 1700)  Labs: Recent Labs    04/04/18 2139 04/05/18 0019 04/05/18 0618 04/05/18 1226 04/05/18 1749  HGB 14.0  --   --  14.5  --   HCT 42.2  --   --  43.6  --   PLT 190  --   --  168  --   HEPARINUNFRC  --   --  0.19*  --  0.50  CREATININE 1.11  --  1.09  --   --   TROPONINI  --  0.48* 0.41* 0.45*  --     Estimated Creatinine Clearance: 60.1 mL/min (by C-G formula based on SCr of 1.09 mg/dL).   Medical History: Past Medical History:  Diagnosis Date  . Asthma    exercise-induced; prn inhaler  . Rotator cuff tear, right    impingement, AC arthrosis    Medications:  No current facility-administered medications on file prior to encounter.    Current Outpatient Medications on File Prior to Encounter  Medication Sig Dispense Refill  . aspirin 81 MG tablet Take 81 mg by mouth as needed (blood thinner).     . budesonide-formoterol (SYMBICORT) 160-4.5 MCG/ACT inhaler Inhale 1 puff into the lungs 2 (two) times daily.    . fluticasone (FLONASE) 50 MCG/ACT nasal spray Place 1 spray into both nostrils daily.    . multivitamin (THERAGRAN) per tablet Take 1 tablet by mouth daily.      . ACETAZOLAMIDE PO Take 1 tablet by mouth 2 (two) times daily.       Assessment: 72 y.o. male with SOB/complete heart block, possible ACS, for heparin.  Heparin level therapeutic after rate increase: 0.50. No infusion related issues noted. No bleeding documented. Possible cath tomorrow.   Goal of Therapy:  Heparin level 0.3-0.7 units/ml Monitor platelets by anticoagulation protocol: Yes   Plan:   Continue heparin gtt at 1200 units/hr Check heparin level/CBC daily   Georga Bora, PharmD Clinical Pharmacist 04/05/2018 6:55 PM Please check AMION for all [place] [facility] numbers

## 2018-04-05 NOTE — Progress Notes (Signed)
[place] for Heparin Indication: chest pain/ACS  Allergies  Allergen Reactions  . Codeine Itching  . Hydrocodone Itching    Patient Measurements: Height: 5\' 8"  (172.7 cm) Weight: 173 lb 1 oz (78.5 kg) IBW/kg (Calculated) : 68.4  Vital Signs: Temp: 98.6 F (37 C) (08/04 0804) Temp Source: Oral (08/04 0804) BP: 132/120 (08/04 0800) Pulse Rate: 67 (08/04 0800)  Labs: Recent Labs    04/04/18 2139 04/05/18 0019 04/05/18 0618  HGB 14.0  --   --   HCT 42.2  --   --   PLT 190  --   --   HEPARINUNFRC  --   --  0.19*  CREATININE 1.11  --  1.09  TROPONINI  --  0.48* 0.41*    Estimated Creatinine Clearance: 60.1 mL/min (by C-G formula based on SCr of 1.09 mg/dL).   Medical History: Past Medical History:  Diagnosis Date  . Asthma    exercise-induced; prn inhaler  . Rotator cuff tear, right    impingement, AC arthrosis    Medications:  No current facility-administered medications on file prior to encounter.    Current Outpatient Medications on File Prior to Encounter  Medication Sig Dispense Refill  . aspirin 81 MG tablet Take 81 mg by mouth as needed (blood thinner).     . budesonide-formoterol (SYMBICORT) 160-4.5 MCG/ACT inhaler Inhale 1 puff into the lungs 2 (two) times daily.    . fluticasone (FLONASE) 50 MCG/ACT nasal spray Place 1 spray into both nostrils daily.    . multivitamin (THERAGRAN) per tablet Take 1 tablet by mouth daily.      . ACETAZOLAMIDE PO Take 1 tablet by mouth 2 (two) times daily.       Assessment: 72 y.o. male with SOB/complete heart block, possible ACS, for heparin.  Heparin level this am below goal, drawn slightly early. CBC was within normal limits early this am. No infusion related issues noted. No bleeding documented. Possible cath tomorrow.   Goal of Therapy:  Heparin level 0.3-0.7 units/ml Monitor platelets by anticoagulation protocol: Yes   Plan:  Heparin 2000 units IV bolus, then increase  heparin to 1200 units/hr Check heparin level in 8 hours.   Erin Hearing PharmD., BCPS Clinical Pharmacist 04/05/2018 9:08 AM

## 2018-04-05 NOTE — Progress Notes (Addendum)
   Checking CTA of chest for PE. Right side of heart mildly dilated. Mild Trop.  On IV Heparin Discussed with he and wife.  Candee Furbish, MD

## 2018-04-06 ENCOUNTER — Encounter (HOSPITAL_COMMUNITY)
Admission: EM | Disposition: A | Discharge status: Home / Self Care | Payer: Self-pay | Source: Home / Self Care | Attending: Cardiology

## 2018-04-06 ENCOUNTER — Encounter (HOSPITAL_COMMUNITY): Payer: Self-pay | Admitting: Physician Assistant

## 2018-04-06 ENCOUNTER — Other Ambulatory Visit: Payer: Self-pay | Admitting: Physician Assistant

## 2018-04-06 DIAGNOSIS — Z8679 Personal history of other diseases of the circulatory system: Secondary | ICD-10-CM

## 2018-04-06 DIAGNOSIS — I441 Atrioventricular block, second degree: Secondary | ICD-10-CM

## 2018-04-06 DIAGNOSIS — I442 Atrioventricular block, complete: Principal | ICD-10-CM

## 2018-04-06 DIAGNOSIS — R778 Other specified abnormalities of plasma proteins: Secondary | ICD-10-CM

## 2018-04-06 DIAGNOSIS — R7989 Other specified abnormal findings of blood chemistry: Secondary | ICD-10-CM

## 2018-04-06 HISTORY — PX: LEFT HEART CATH AND CORONARY ANGIOGRAPHY: CATH118249

## 2018-04-06 LAB — CBC
HCT: 41.7 % (ref 39.0–52.0)
Hemoglobin: 14 g/dL (ref 13.0–17.0)
MCH: 31.6 pg (ref 26.0–34.0)
MCHC: 33.6 g/dL (ref 30.0–36.0)
MCV: 94.1 fL (ref 78.0–100.0)
Platelets: 180 10*3/uL (ref 150–400)
RBC: 4.43 MIL/uL (ref 4.22–5.81)
RDW: 14.7 % (ref 11.5–15.5)
WBC: 6.2 10*3/uL (ref 4.0–10.5)

## 2018-04-06 LAB — HEPARIN LEVEL (UNFRACTIONATED)
Heparin Unfractionated: 0.34 IU/mL (ref 0.30–0.70)
Heparin Unfractionated: 0.43 IU/mL (ref 0.30–0.70)

## 2018-04-06 SURGERY — LEFT HEART CATH AND CORONARY ANGIOGRAPHY
Anesthesia: LOCAL

## 2018-04-06 MED ORDER — LIDOCAINE HCL (PF) 1 % IJ SOLN
INTRAMUSCULAR | Status: AC
  Filled 2018-04-06: qty 30

## 2018-04-06 MED ORDER — LIDOCAINE HCL (PF) 1 % IJ SOLN
INTRAMUSCULAR | Status: DC | PRN
  Administered 2018-04-06: 2 mL

## 2018-04-06 MED ORDER — VERAPAMIL HCL 2.5 MG/ML IV SOLN
INTRAVENOUS | Status: DC | PRN
  Administered 2018-04-06: 1 mL via INTRA_ARTERIAL

## 2018-04-06 MED ORDER — SODIUM CHLORIDE 0.9% FLUSH: 3.0000 mL | INTRAVENOUS | Status: DC | PRN

## 2018-04-06 MED ORDER — ACETAMINOPHEN 325 MG PO TABS: 650.0000 mg | ORAL_TABLET | ORAL | Status: DC | PRN

## 2018-04-06 MED ORDER — FENTANYL CITRATE (PF) 100 MCG/2ML IJ SOLN
INTRAMUSCULAR | Status: AC
  Filled 2018-04-06: qty 2

## 2018-04-06 MED ORDER — ONDANSETRON HCL 4 MG/2ML IJ SOLN: 4.0000 mg | Freq: Four times a day (QID) | INTRAMUSCULAR | Status: DC | PRN

## 2018-04-06 MED ORDER — SODIUM CHLORIDE 0.9 % IV SOLN: 250.0000 mL | INTRAVENOUS | Status: DC | PRN

## 2018-04-06 MED ORDER — SODIUM CHLORIDE 0.9% FLUSH: 3.0000 mL | Freq: Two times a day (BID) | INTRAVENOUS | Status: DC

## 2018-04-06 MED ORDER — HEPARIN SODIUM (PORCINE) 1000 UNIT/ML IJ SOLN
INTRAMUSCULAR | Status: AC
  Filled 2018-04-06: qty 1

## 2018-04-06 MED ORDER — HEPARIN SODIUM (PORCINE) 5000 UNIT/ML IJ SOLN
5000.0000 [IU] | Freq: Three times a day (TID) | INTRAMUSCULAR | Status: DC
  Filled 2018-04-06: qty 1

## 2018-04-06 MED ORDER — SODIUM CHLORIDE 0.9 % IV SOLN
INTRAVENOUS | Status: AC
  Administered 2018-04-06: 16:00:00 via INTRAVENOUS

## 2018-04-06 MED ORDER — MIDAZOLAM HCL 2 MG/2ML IJ SOLN
INTRAMUSCULAR | Status: AC
  Filled 2018-04-06: qty 2

## 2018-04-06 MED ORDER — SODIUM CHLORIDE 0.9 % WEIGHT BASED INFUSION: 3.0000 mL/kg/h | INTRAVENOUS | Status: DC

## 2018-04-06 MED ORDER — VERAPAMIL HCL 2.5 MG/ML IV SOLN
INTRAVENOUS | Status: AC
  Filled 2018-04-06: qty 2

## 2018-04-06 MED ORDER — SODIUM CHLORIDE 0.9 % WEIGHT BASED INFUSION: 1.0000 mL/kg/h | INTRAVENOUS | Status: DC

## 2018-04-06 MED ORDER — HEPARIN (PORCINE) IN NACL 1000-0.9 UT/500ML-% IV SOLN
INTRAVENOUS | Status: DC | PRN
  Administered 2018-04-06 (×2): 500 mL

## 2018-04-06 MED ORDER — IOHEXOL 350 MG/ML SOLN
INTRAVENOUS | Status: DC | PRN
  Administered 2018-04-06: 50 mL via INTRAVENOUS

## 2018-04-06 MED ORDER — SODIUM CHLORIDE 0.9% FLUSH
3.0000 mL | Freq: Two times a day (BID) | INTRAVENOUS | Status: DC
  Administered 2018-04-06: 3 mL via INTRAVENOUS

## 2018-04-06 MED ORDER — HEPARIN SODIUM (PORCINE) 1000 UNIT/ML IJ SOLN
INTRAMUSCULAR | Status: DC | PRN
  Administered 2018-04-06: 4000 [IU] via INTRAVENOUS

## 2018-04-06 MED ORDER — ASPIRIN 81 MG PO CHEW: 81.0000 mg | CHEWABLE_TABLET | ORAL | Status: DC

## 2018-04-06 MED ORDER — HEPARIN (PORCINE) IN NACL 1000-0.9 UT/500ML-% IV SOLN
INTRAVENOUS | Status: AC
  Filled 2018-04-06: qty 1000

## 2018-04-06 MED ORDER — MIDAZOLAM HCL 2 MG/2ML IJ SOLN
INTRAMUSCULAR | Status: DC | PRN
  Administered 2018-04-06: 2 mg via INTRAVENOUS

## 2018-04-06 MED ORDER — FENTANYL CITRATE (PF) 100 MCG/2ML IJ SOLN
INTRAMUSCULAR | Status: DC | PRN
  Administered 2018-04-06: 25 ug via INTRAVENOUS

## 2018-04-06 SURGICAL SUPPLY — 9 items

## 2018-04-06 NOTE — Interval H&P Note (Signed)
Cath Lab Visit (complete for each Cath Lab visit)  Clinical Evaluation Leading to the Procedure:   ACS: Yes.    Non-ACS:    Anginal Classification: CCS IV  Anti-ischemic medical therapy: Minimal Therapy (1 class of medications)  Non-Invasive Test Results: No non-invasive testing performed  Prior CABG: No previous CABG      History and Physical Interval Note:  04/06/2018 2:29 PM  Shanda Bumps  has presented today for surgery, with the diagnosis of cp  The various methods of treatment have been discussed with the patient and family. After consideration of risks, benefits and other options for treatment, the patient has consented to  Procedure(s): LEFT HEART CATH AND CORONARY ANGIOGRAPHY (N/A) as a surgical intervention .  The patient's history has been reviewed, patient examined, no change in status, stable for surgery.  I have reviewed the patient's chart and labs.  Questions were answered to the patient's satisfaction.     Larae Grooms

## 2018-04-06 NOTE — Progress Notes (Signed)
[place] for Heparin Indication: chest pain/ACS  Allergies  Allergen Reactions  . Codeine Itching  . Hydrocodone Itching    Patient Measurements: Height: 5\' 8"  (172.7 cm) Weight: 173 lb 1 oz (78.5 kg) IBW/kg (Calculated) : 68.4  Vital Signs: Temp: 98 F (36.7 C) (08/05 0748) Temp Source: Oral (08/05 0748) BP: 146/89 (08/05 0814) Pulse Rate: 63 (08/05 0700)  Labs: Recent Labs    04/04/18 2139 04/05/18 0019  04/05/18 0618 04/05/18 1226 04/05/18 1749 04/05/18 2350 04/06/18 0256  HGB 14.0  --   --   --  14.5  --   --  14.0  HCT 42.2  --   --   --  43.6  --   --  41.7  PLT 190  --   --   --  168  --   --  180  HEPARINUNFRC  --   --    < > 0.19*  --  0.50 0.34 0.43  CREATININE 1.11  --   --  1.09  --   --   --   --   TROPONINI  --  0.48*  --  0.41* 0.45*  --   --   --    < > = values in this interval not displayed.    Estimated Creatinine Clearance: 60.1 mL/min (by C-G formula based on SCr of 1.09 mg/dL).   Medical History: Past Medical History:  Diagnosis Date  . Asthma    exercise-induced; prn inhaler  . Rotator cuff tear, right    impingement, AC arthrosis    Medications:  No current facility-administered medications on file prior to encounter.    Current Outpatient Medications on File Prior to Encounter  Medication Sig Dispense Refill  . aspirin 81 MG tablet Take 81 mg by mouth as needed (blood thinner).     . budesonide-formoterol (SYMBICORT) 160-4.5 MCG/ACT inhaler Inhale 1 puff into the lungs 2 (two) times daily.    . fluticasone (FLONASE) 50 MCG/ACT nasal spray Place 1 spray into both nostrils daily.    . multivitamin (THERAGRAN) per tablet Take 1 tablet by mouth daily.      . ACETAZOLAMIDE PO Take 1 tablet by mouth 2 (two) times daily.       Assessment: 72 y.o. male with SOB/secondary heart block type I, possible ACS, for heparin.  Heparin level therapeutic at 0.43, on 1200 units/hr. No infusion related  issues noted. No bleeding documented, CBC stable. Cath scheduled for today.   Goal of Therapy:  Heparin level 0.3-0.7 units/ml Monitor platelets by anticoagulation protocol: Yes   Plan:  Continue heparin gtt at 1200 units/hr Check heparin level/CBC daily. F/u after cath for anticoag plans.  Jonette Eva, PharmD Candidate 04/06/2018 8:19 AM Please check AMION for all [place] numbers

## 2018-04-06 NOTE — Consult Note (Addendum)
Cardiology Consultation:   Patient ID: David [name]; [id number]; 1945/09/25   Admit date: 04/04/2018 Date of Consult: 04/06/2018  Primary Care Provider: Wayland Salinas, MD Primary Cardiologist: New to [facility], Dr. Marlou Porch Primary Electrophysiologist:  New to Dr. Curt Bears   Patient Profile:   David [name] is a 72 y.o. male with a hx of asthma only who is being seen today for the evaluation of heart block at the request of Dr. Marlou Porch.  History of Present Illness:   David [name] was recently in [place] where he felt unusually fatigued and easily winded, he points out that he has never been at such high elevations and other members of his group also commented about similar symptoms, so he attributed this to the altitude.  He had no CP, palpitations or cardiac awareness.  He exercises very routinely, at a gym, with plenty of cardio, always with excellent exertional capacity, only occassionally with some minimal SOB that he uses his inhaler for and it resolves, and remarks that he wears a HR monitor while working out and usually gets a workout of about 600 calories in an hour.  Once he returned from [place] he got back at his usual activities and work out.  He denied any symptoms, maybe just a little tired but able to complete his workout and only made note that his HR monitor only noted a 200 calorie work out, they though perhaps was a faulty monitor but the replacement said the same thing and the follow day as well. He had taken note though that once back from [place] he gained 5lbs and continued to gain 1lb a day with swelling b/l ankles.  He exercised routinely the day of his admission with his HR monitor "woring as usual/normally" and without any symptoms.  He sought medical attention worried that he may have a kidney problem 2/2 swelling and rapid weight gain/fluid retention.  He was found to have Mobitz I on his EKG/telemetry, his Trip was mildly high 0.15, and BNP 301, admitted by cardiology  service to the ICU, chest CT r/o PE.  EP is asked to weigh in in his heart block.  The patient remains without appreciable symptoms.  No CP, palpitations, reports of dizziness, near syncope or syncope, here or historically.  He has ambulated in the hallways here without difficulty or symptoms.  LABS: K+ 4.1 > 3.9 BUN/Creat 22/1.11 > 16/1.09 AST 49 ALT 53 BNP 301 poc Trop 0.15 Trop I: 0.48 > 0.41 > 0.45  WBC 5.8 > 6.2 H/H 14/42 Plts 190 TSH 2.137   Home meds reviewed, no potential rate limiting, nodal blockingagents    Past Medical History:  Diagnosis Date  . Asthma    exercise-induced; prn inhaler  . Rotator cuff tear, right    impingement, AC arthrosis    Past Surgical History:  Procedure Laterality Date  . LUMBAR LAMINECTOMY  age 6s  . SHOULDER ARTHROSCOPY  08/06/2011   Procedure: ARTHROSCOPY SHOULDER;  Surgeon: Cammie Sickle., MD;  Location: [facility];  Service: Orthopedics;  Laterality: Right;  with subacromial decompression, arthroscopic subscapularis repair, and rotator cuff repair  . TRIGGER FINGER RELEASE Right 06/30/2014   Procedure: RELEASE A-1 PULLEY RIGHT THUMB;  Surgeon: Daryll Brod, MD;  Location: [facility];  Service: Orthopedics;  Laterality: Right;  ANESTHESIA:  IV REGIONAL FAB     Home Medications:  Prior to Admission medications   Medication Sig Start Date End Date Taking? Authorizing Provider  aspirin 81 MG tablet  Take 81 mg by mouth as needed (blood thinner).    Yes [provider]  budesonide-formoterol (SYMBICORT) 160-4.5 MCG/ACT inhaler Inhale 1 puff into the lungs 2 (two) times daily. 07/12/16  Yes [provider]  fluticasone (FLONASE) 50 MCG/ACT nasal spray Place 1 spray into both nostrils daily.   Yes [provider]  multivitamin [facility]) per tablet Take 1 tablet by mouth daily.     Yes [provider]  ACETAZOLAMIDE PO Take 1 tablet by mouth 2 (two) times daily.     [provider]    Inpatient Medications: Scheduled Meds: . aspirin  81 mg Oral Daily  . atorvastatin  40 mg Oral q1800  . fluticasone  1 spray Each Nare Daily  . mometasone-formoterol  2 puff Inhalation BID  . multivitamin with minerals  1 tablet Oral Daily   Continuous Infusions: . heparin 1,200 Units/hr (04/05/18 1659)   PRN Meds: acetaminophen, ondansetron (ZOFRAN) IV  Allergies:    Allergies  Allergen Reactions  . Codeine Itching  . Hydrocodone Itching    Social History:   Social History   Socioeconomic History  . Marital status: Married    Spouse name: Not on file  . Number of children: Not on file  . Years of education: Not on file  . Highest education level: Not on file  Occupational History  . Not on file  Social Needs  . Financial resource strain: Not on file  . Food insecurity:    Worry: Not on file    Inability: Not on file  . Transportation needs:    Medical: Not on file    Non-medical: Not on file  Tobacco Use  . Smoking status: Never Smoker  . Smokeless tobacco: Never Used  Substance and Sexual Activity  . Alcohol use: Yes    Comment: occ. beer  . Drug use: No  . Sexual activity: Not on file  Lifestyle  . Physical activity:    Days per week: Not on file    Minutes per session: Not on file  . Stress: Not on file  Relationships  . Social connections:    Talks on phone: Not on file    Gets together: Not on file    Attends religious service: Not on file    Active member of club or organization: Not on file    Attends meetings of clubs or organizations: Not on file    Relationship status: Not on file  . Intimate partner violence:    Fear of current or ex partner: Not on file    Emotionally abused: Not on file    Physically abused: Not on file    Forced sexual activity: Not on file  Other Topics Concern  . Not on file  Social History Narrative  . Not on file    Family History:   ** Family History  Problem Relation Age of  Onset  . Heart Problems Mother        had pacemaker, lived to 82  . Alzheimer's disease Father      ROS:  Please see the history of present illness.  All other ROS reviewed and negative.     Physical Exam/Data:   Vitals:   04/06/18 0814 04/06/18 0900 04/06/18 1040 04/06/18 1100  BP: (!) 146/89 (!) 121/91 (!) 131/98 (!) 121/94  Pulse:  (!) 44 (!) 54 (!) 42  Resp: 11 13 19 12   Temp:      TempSrc:      SpO2:  94% 94% 97% 97%  Weight:      Height:        Intake/Output Summary (Last 24 hours) at 04/06/2018 1130 Last data filed at 04/06/2018 0800 Gross per 24 hour  Intake 252 ml  Output 1000 ml  Net -748 ml   Filed Weights   04/04/18 1535 04/05/18 0400  Weight: 173 lb (78.5 kg) 173 lb 1 oz (78.5 kg)   Body mass index is 26.31 kg/m.  General:  Well nourished, well developed, in no acute distress HEENT: normal Lymph: no adenopathy Neck: no JVD Endocrine:  No thryomegaly Vascular: No carotid bruits Cardiac:  RRR; no murmurs, gallops or rubs Lungs:  CTA b/l, no wheezing, rhonchi or rales  Abd: soft, nontender  Ext: no edema Musculoskeletal:  No deformities Skin: warm and dry  Neuro:  No gross focal abnormalities noted Psych:  Normal affect   EKG:  The EKG was personally reviewed and demonstrates:    #1 SB 50bpm, Mobitz I #2 SB 33bpm, Mobitz I, also 2:1, and some suggestion of perhaps CHB #3 SB 45bpm, Mobitz I  No historical EKGs  Telemetry:  Telemetry was personally reviewed and demonstrates:   SR, Mobitz I AV block, rates generally 40's50's, he has periods of 1:1 conduction with 1st degree AVBlock, and very briefly Mobitz I >> 2:1 for a few beats here and there  Relevant CV Studies:  04/05/18: TTE Study Conclusions - Left ventricle: The cavity size was normal. Wall thickness was   normal. Systolic function was normal. The estimated ejection   fraction was in the range of 55% to 60%. Wall motion was normal;   there were no regional wall motion abnormalities. -  Aortic valve: Trileaflet; mildly thickened, mildly calcified   leaflets. - Right ventricle: The cavity size was mildly dilated. Wall   thickness was normal. - Tricuspid valve: There was trivial regurgitation.   Laboratory Data:  Chemistry Recent Labs  Lab 04/04/18 2139 04/05/18 0618  NA 141 141  K 4.1 3.9  CL 106 105  CO2 26 23  GLUCOSE 86 79  BUN 22 16  CREATININE 1.11 1.09  CALCIUM 8.9 8.5*  GFRNONAA >60 >60  GFRAA >60 >60  ANIONGAP 9 13    Recent Labs  Lab 04/04/18 2139  PROT 6.6  ALBUMIN 3.9  AST 49*  ALT 53*  ALKPHOS 56  BILITOT 0.6   Hematology Recent Labs  Lab 04/04/18 2139 04/05/18 1226 04/06/18 0256  WBC 5.8 5.4 6.2  RBC 4.39 4.56 4.43  HGB 14.0 14.5 14.0  HCT 42.2 43.6 41.7  MCV 96.1 95.6 94.1  MCH 31.9 31.8 31.6  MCHC 33.2 33.3 33.6  RDW 15.6* 15.2 14.7  PLT 190 168 180   Cardiac Enzymes Recent Labs  Lab 04/05/18 0019 04/05/18 0618 04/05/18 1226  TROPONINI 0.48* 0.41* 0.45*    Recent Labs  Lab 04/04/18 2142  TROPIPOC 0.15*    BNP Recent Labs  Lab 04/04/18 2139  BNP 301.3*    DDimer No results for input(s): DDIMER in the last 168 hours.  Radiology/Studies:   Dg Chest 2 View Result Date: 04/04/2018 CLINICAL DATA:  He states he has returned from [place], where last week he attended a [place]. He did take some acetazolamide and used supplemental oxygen while he was there. He is here today c/o mild shortness of breath. Slow but steady weight gain of 6 pounds. EXAM: CHEST - 2 VIEW COMPARISON:  10/28/2008, 10/04/2006 FINDINGS: The heart is  mildly enlarged. There are no focal consolidations or pleural effusions. No pulmonary edema. Degenerative changes are seen in thoracic spine. IMPRESSION: Mild cardiomegaly.  No edema. Electronically Signed   By: Nolon Nations M.D.   On: 04/04/2018 16:10    Ct Angio Chest Pe W Or Wo Contrast Result Date: 04/05/2018 CLINICAL DATA:  Edema in feet and SOB EXAM: CT  ANGIOGRAPHY CHEST WITH CONTRAST TECHNIQUE: Multidetector CT imaging of the chest was performed using the standard protocol during bolus administration of intravenous contrast. Multiplanar CT image reconstructions and MIPs were obtained to evaluate the vascular anatomy. CONTRAST:  172mL ISOVUE-370 IOPAMIDOL (ISOVUE-370) INJECTION 76% COMPARISON:  Chest x-ray on 04/04/2018 FINDINGS: Cardiovascular: Heart size is UPPER normal. There is minimal atherosclerotic calcification of coronary vessels. Minimal atherosclerotic calcification of the thoracic aorta not associated with aneurysm. The pulmonary arteries are well opacified. No acute pulmonary embolus. Mediastinum/Nodes: The visualized portion of the thyroid gland has a normal appearance. Esophagus is normal. No mediastinal, hilar, or axillary adenopathy. Lungs/Pleura: There is minimal atelectasis at both lung bases. No pulmonary edema. No consolidations or pleural effusions. Upper Abdomen: No acute abnormality. Musculoskeletal: Moderate midthoracic spondylosis. Review of the MIP images confirms the above findings. IMPRESSION: 1. Technically adequate exam showing no acute pulmonary embolus. 2. Minimal atherosclerotic calcification of the coronary vessels and thoracic aorta. 3. Aortic atherosclerosis.  (ICD10-I70.0) 4. No acute pulmonary abnormality. Electronically Signed   By: Nolon Nations M.D.   On: 04/05/2018 12:43    Assessment and Plan:   1. Edema, weight gain     Normal LVEF, no DD 2. Heart block     Mobitz I is primary rhythm, he has 1:1 intermittently with 1st degree AVBlock PR about      He has 2:1 conduction briefly infrequently     One EKG towards end of tracing some suggestion of more advanced block, though not clearly     QRS is narrow, 80-40ms 3. Abnormal Trop     Flat     No CP     Planned for LHC today 4. Recent travel     Chest CT r/o PE   No clear symptoms of bradycardia At this time, plan is to proceed with LHC and recommend  ambulatory monitoring    For questions or updates, please contact [facility] Please consult [email or web address] for contact info under Cardiology/STEMI.   Signed, Baldwin Jamaica, PA-C  04/06/2018 11:30 AM  I have seen and examined this patient with Tommye Standard.  Agree with above, note added to reflect my findings.  On exam, bradycardic, no murmurs, lungs clear.  Patient presented to the hospital with weakness and fatigue.  Was found to have an elevated troponin and Mobitz 1 AV block.  Plan is for left heart catheterization today.  He did have a CT scan that showed no evidence of pulmonary embolism.  Has had AV Wenke block, and has been more fatigued since getting home from a trip in [place].  He has been exercising and at times can get his heart rate into the 120s.  He Amarachi Kotz likely need a pacemaker, but is hesitant at this point.  We Holley Wirt fit him with a 48-hour monitor to further determine his rhythm outside of the hospital.  Ysabela Keisler M. Briaunna Grindstaff MD 04/06/2018 11:55 AM

## 2018-04-07 ENCOUNTER — Encounter (HOSPITAL_COMMUNITY): Payer: Self-pay | Admitting: Interventional Cardiology

## 2018-04-07 DIAGNOSIS — R600 Localized edema: Secondary | ICD-10-CM

## 2018-04-07 DIAGNOSIS — I441 Atrioventricular block, second degree: Secondary | ICD-10-CM

## 2018-04-07 NOTE — Plan of Care (Signed)
  Problem: Education: Goal: Knowledge of General Education information will improve Description: Including pain rating scale, medication(s)/side effects and non-pharmacologic comfort measures Outcome: Progressing   Problem: Clinical Measurements: Goal: Ability to maintain clinical measurements within normal limits will improve Outcome: Progressing Goal: Will remain free from infection Outcome: Progressing   

## 2018-04-07 NOTE — Discharge Summary (Signed)
Discharge Summary    Patient ID: David [name],  MRN: [id number], DOB/AGE: [birth year] 72 y.o.  Admit date: 04/04/2018 Discharge date: 04/07/2018  Primary Care Provider: Wayland Salinas Primary Cardiologist: No primary care provider on file.- To be Dr. Curt Bears  Discharge Diagnoses    Active Problems:   Complete heart block (HCC)   Elevated troponin   Mobitz type 1 second degree atrioventricular block   Edema extremities   Allergies Allergies  Allergen Reactions  . Codeine Itching  . Hydrocodone Itching    Diagnostic Studies/Procedures    LEFT HEART CATH AND CORONARY ANGIOGRAPHY  04/06/18  Conclusion    The left ventricular systolic function is normal.  LV end diastolic pressure is normal.  The left ventricular ejection fraction is 55-65% by visual estimate.  There is no aortic valve stenosis.  No significant CAD.   No indication for antiplatelet therapy at this time.   Plan per EP regarding bradycardia.   _____________  Echocardiogram 04/05/2018  Study Conclusions - Left ventricle: The cavity size was normal. Wall thickness was   normal. Systolic function was normal. The estimated ejection   fraction was in the range of 55% to 60%. Wall motion was normal;   there were no regional wall motion abnormalities. - Aortic valve: Trileaflet; mildly thickened, mildly calcified   leaflets. - Right ventricle: The cavity size was mildly dilated. Wall   thickness was normal. - Tricuspid valve: There was trivial regurgitation.   History of Present Illness     David [name] is a 72 y.o. male with a history of asthma who presented to the ED with dyspnea and found to have bradycardia.   The patient is healthy at baseline and has no known cardiac history. He visited [place] several days prior and had some dyspnea there due to the altitude. He took acetazolamide without significant benefit. His dyspnea continued to worsen after coming home. He has also had LE edema and  11 lbs weight gain. He also reported some nonspecific discomfort in his chest and shoulder with exercise. He denied any syncope or significant lightheadedness or other symptoms. At baseline, he is very active and goes to the gym several times per week.   He presented to the outside ED, where he had HR 30s and SBP 130-160s. Labs were notable for troponin 0.15 and BNP 301. He underwent ECG that showed bradycardia to the 30s. Interpretation from outside provider was concerning for complete heart block. He was transferred to [place] for further management.  Upon transfer he denied any active complaints. His telemetry was reviewed and showed regular P waves with irregular R-R intervals with ventricular rate in the 30s with apparent [place] pattern.  Hospital Course     Consultants: Dr. Curt Bears for EP   Right heart mildly dilated on echo, but EF was normal 55-60% with no regional wall motion abnormalities. CTA chest was negative for PE.  Troponins were elevated and flat pattern 0.48 but no chest pain.  The patient remained without appreciable symptoms.  No chest pain, palpitations, dizziness, near syncope/syncope.  Dr. Curt Bears was consulted for EP for heart block.  He found Mobitz I is primary rhythm, he has 1:1 intermittently with 1st degree AVBlock PR about. He has 2:1 conduction briefly infrequently. One EKG towards end of tracing some suggestion of more advanced block, though not clearly. QRS is narrow, 80-78ms.  He was felt to likely need a pacemaker but the patient was hesitant.  He is planned for 48-hour  monitor to further determine his rhythm outside of the hospital.  Pt was taken to the Cath Lab to evaluate for ischemic cause of arrhythmia and he was found to have normal coronary arteries. He will follow up with Dr. Curt Bears on 05/11/18 after results of cardiac monitor.   Patient has been seen by Dr. Radford Pax today and deemed ready for discharge home. All follow up appointments have been scheduled.  Discharge medications are listed below. _____________  Discharge Vitals Blood pressure 119/74, pulse (!) 48, temperature 98.6 F (37 C), temperature source Oral, resp. rate 16, height 5\' 8"  (1.727 m), weight 163 lb 9.6 oz (74.2 kg), SpO2 96 %.  Filed Weights   04/04/18 1535 04/05/18 0400 04/06/18 1343  Weight: 173 lb (78.5 kg) 173 lb 1 oz (78.5 kg) 163 lb 9.6 oz (74.2 kg)    Labs & Radiologic Studies    CBC Recent Labs    04/04/18 2139 04/05/18 1226 04/06/18 0256  WBC 5.8 5.4 6.2  NEUTROABS 3.0  --   --   HGB 14.0 14.5 14.0  HCT 42.2 43.6 41.7  MCV 96.1 95.6 94.1  PLT 190 168 921   Basic Metabolic Panel Recent Labs    04/04/18 2139 04/05/18 0618  NA 141 141  K 4.1 3.9  CL 106 105  CO2 26 23  GLUCOSE 86 79  BUN 22 16  CREATININE 1.11 1.09  CALCIUM 8.9 8.5*  MG  --  2.0   Liver Function Tests Recent Labs    04/04/18 2139  AST 49*  ALT 53*  ALKPHOS 56  BILITOT 0.6  PROT 6.6  ALBUMIN 3.9   No results for input(s): LIPASE, AMYLASE in the last 72 hours. Cardiac Enzymes Recent Labs    04/05/18 0019 04/05/18 0618 04/05/18 1226  TROPONINI 0.48* 0.41* 0.45*   BNP Invalid input(s): POCBNP D-Dimer No results for input(s): DDIMER in the last 72 hours. Hemoglobin A1C Recent Labs    04/05/18 0019  HGBA1C 5.4   Fasting Lipid Panel Recent Labs    04/05/18 0019  CHOL 141  HDL 37*  LDLCALC 99  TRIG 27  CHOLHDL 3.8   Thyroid Function Tests Recent Labs    04/05/18 0839  TSH 2.137   _____________  Dg Chest 2 View  Result Date: 04/04/2018 CLINICAL DATA:  He states he has returned from [place], where last week he attended a [place]. He did take some acetazolamide and used supplemental oxygen while he was there. He is here today c/o mild shortness of breath. Slow but steady weight gain of 6 pounds. EXAM: CHEST - 2 VIEW COMPARISON:  10/28/2008, 10/04/2006 FINDINGS: The heart is mildly enlarged. There are no focal  consolidations or pleural effusions. No pulmonary edema. Degenerative changes are seen in thoracic spine. IMPRESSION: Mild cardiomegaly.  No edema. Electronically Signed   By: Nolon Nations M.D.   On: 04/04/2018 16:10   Ct Angio Chest Pe W Or Wo Contrast  Result Date: 04/05/2018 CLINICAL DATA:  Edema in feet and SOB EXAM: CT ANGIOGRAPHY CHEST WITH CONTRAST TECHNIQUE: Multidetector CT imaging of the chest was performed using the standard protocol during bolus administration of intravenous contrast. Multiplanar CT image reconstructions and MIPs were obtained to evaluate the vascular anatomy. CONTRAST:  159mL ISOVUE-370 IOPAMIDOL (ISOVUE-370) INJECTION 76% COMPARISON:  Chest x-ray on 04/04/2018 FINDINGS: Cardiovascular: Heart size is UPPER normal. There is minimal atherosclerotic calcification of coronary vessels. Minimal atherosclerotic calcification of the thoracic aorta not associated with aneurysm.  The pulmonary arteries are well opacified. No acute pulmonary embolus. Mediastinum/Nodes: The visualized portion of the thyroid gland has a normal appearance. Esophagus is normal. No mediastinal, hilar, or axillary adenopathy. Lungs/Pleura: There is minimal atelectasis at both lung bases. No pulmonary edema. No consolidations or pleural effusions. Upper Abdomen: No acute abnormality. Musculoskeletal: Moderate midthoracic spondylosis. Review of the MIP images confirms the above findings. IMPRESSION: 1. Technically adequate exam showing no acute pulmonary embolus. 2. Minimal atherosclerotic calcification of the coronary vessels and thoracic aorta. 3. Aortic atherosclerosis.  (ICD10-I70.0) 4. No acute pulmonary abnormality. Electronically Signed   By: Nolon Nations M.D.   On: 04/05/2018 12:43   Disposition   Pt is being discharged home today in good condition.  Follow-up Plans & Appointments    Follow-up Information    [place] [facility]. Call.   Specialty:  Cardiology Why:  call to  schedule heart monitoring hook up Contact information: [address], [address] [place]       Constance Haw, MD Follow up on 05/11/2018.   Specialty:  Cardiology Why:  12:15PM Contact information: [place] [address] [phone]          Discharge Instructions    Diet - low sodium heart healthy   Complete by:  As directed    Increase activity slowly   Complete by:  As directed       Discharge Medications   Allergies as of 04/07/2018      Reactions   Codeine Itching   Hydrocodone Itching      Medication List    STOP taking these medications   ACETAZOLAMIDE PO     TAKE these medications   aspirin 81 MG tablet Take 81 mg by mouth as needed (blood thinner).   budesonide-formoterol 160-4.5 MCG/ACT inhaler Commonly known as:  SYMBICORT Inhale 1 puff into the lungs 2 (two) times daily.   fluticasone 50 MCG/ACT nasal spray Commonly known as:  FLONASE Place 1 spray into both nostrils daily.   multivitamin per tablet Take 1 tablet by mouth daily.        Acute coronary syndrome (MI, NSTEMI, STEMI, etc) this admission?:  No.  The elevated Troponin was due to the acute medical illness or demand ischemia.    Outstanding Labs/Studies   For 48 hr holter monitor.  Duration of Discharge Encounter   Greater than 30 minutes including physician time.  Signed, Daune Perch, NP 04/07/2018, 8:38 AM

## 2018-04-07 NOTE — Progress Notes (Signed)
Pt is refusing all medications and care. Pt also refused labs for phlebotomist this am. He has stated several times that he just wants to go home and feels like he doesn't need to be here anymore. I did encourage the pt to wait until MD makes rounds in the am.

## 2018-04-07 NOTE — Progress Notes (Signed)
Upon initial assessment patient off monitor. Patient stated that he is going home this AM one way or another and requested PIVs be removed. Cardiology notified and discharge summary and orders placed. Patient educated on follow up appointments regarding Holter monitor. Patient ambulated to main entrance for discharge with assistance from Penny Pia, Therapist, sports.

## 2018-04-09 ENCOUNTER — Other Ambulatory Visit: Payer: Self-pay | Admitting: Physician Assistant

## 2018-04-09 ENCOUNTER — Ambulatory Visit (INDEPENDENT_AMBULATORY_CARE_PROVIDER_SITE_OTHER): Payer: Medicare Other

## 2018-04-09 DIAGNOSIS — I440 Atrioventricular block, first degree: Secondary | ICD-10-CM

## 2018-04-09 DIAGNOSIS — R001 Bradycardia, unspecified: Secondary | ICD-10-CM | POA: Diagnosis not present

## 2018-04-09 DIAGNOSIS — I441 Atrioventricular block, second degree: Secondary | ICD-10-CM

## 2018-04-20 ENCOUNTER — Encounter: Payer: Self-pay | Admitting: Cardiology

## 2018-04-20 NOTE — Telephone Encounter (Signed)
Left message for patient to call back  

## 2018-04-20 NOTE — Telephone Encounter (Signed)
Follow Up:      Returning your call from today. 

## 2018-04-20 NOTE — Telephone Encounter (Signed)
New message   Please call patient with holter results

## 2018-04-21 NOTE — Telephone Encounter (Signed)
This encounter was created in error - please disregard.

## 2018-04-29 ENCOUNTER — Telehealth: Payer: Self-pay | Admitting: Cardiology

## 2018-04-29 NOTE — Telephone Encounter (Signed)
Follow up    Please return call for monitor results

## 2018-04-29 NOTE — Telephone Encounter (Signed)
° °  Patient requesting call for monitor results

## 2018-04-29 NOTE — Telephone Encounter (Signed)
Patient has been contacted re: monitor results and follow up has been scheduled. This advice request has been forwarded to scheduling supervisor.

## 2018-04-29 NOTE — Telephone Encounter (Signed)
Spoke with patient. He has been informed that Dr. Curt Bears wants to see sooner to discuss results of holter. He has been scheduled at 12:30 pm on 05/05/18. Asked why it took so long to hear back. I advised him we started to contact him the day the results were sent by Dr. Curt Bears (8/19) Confirmed his phone number. And that he prefers to only have calls to this one number.

## 2018-04-30 NOTE — Telephone Encounter (Signed)
Spoke with patient over 30 min He appreciates the follow up. We will discuss monitor/findings/care plan at [facility] next week. He is agreeable to plan.

## 2018-05-01 ENCOUNTER — Encounter: Payer: Self-pay | Admitting: Cardiology

## 2018-05-04 NOTE — Progress Notes (Signed)
Electrophysiology Office Note   Date:  05/05/2018   ID:  David [name], DOB [birth year], MRN [id number]  PCP:  Wayland Salinas, MD  Cardiologist:  Marlou Porch Primary Electrophysiologist:  Davarius Ridener David Leeds, MD    No chief complaint on file.    History of Present Illness: David [name] is a 72 y.o. male who is being seen today for the evaluation of bradycardia at the request of Ryter-Brown, Shyrl Numbers, *. Presenting today for electrophysiology evaluation.  He initially presented to the hospital with weight gain and lower extremity edema.  Echo showed a normal ejection fraction and left heart catheterization showed no evidence of coronary disease.  He was found to have Mobitz 1 and 2-1 AV block while in the hospital.  He wore a Holter monitor that showed similar results.  Currently he feels well without major complaint.  He is able to do all of his daily activities.  He is able to exercise without restraint.    Today, he denies symptoms of palpitations, chest pain, shortness of breath, orthopnea, PND, lower extremity edema, claudication, dizziness, presyncope, syncope, bleeding, or neurologic sequela. The patient is tolerating medications without difficulties.    Past Medical History:  Diagnosis Date  . Asthma    exercise-induced; prn inhaler  . Rotator cuff tear, right    impingement, AC arthrosis   Past Surgical History:  Procedure Laterality Date  . LEFT HEART CATH AND CORONARY ANGIOGRAPHY N/A 04/06/2018   Procedure: LEFT HEART CATH AND CORONARY ANGIOGRAPHY;  Surgeon: Jettie Booze, MD;  Location: [facility];  Service: Cardiovascular;  Laterality: N/A;  . LUMBAR LAMINECTOMY  age 40s  . SHOULDER ARTHROSCOPY  08/06/2011   Procedure: ARTHROSCOPY SHOULDER;  Surgeon: Cammie Sickle., MD;  Location: [facility];  Service: Orthopedics;  Laterality: Right;  with subacromial decompression, arthroscopic subscapularis repair, and rotator cuff repair  . TRIGGER  FINGER RELEASE Right 06/30/2014   Procedure: RELEASE A-1 PULLEY RIGHT THUMB;  Surgeon: Daryll Brod, MD;  Location: [facility];  Service: Orthopedics;  Laterality: Right;  ANESTHESIA:  IV REGIONAL FAB     Current Outpatient Medications  Medication Sig Dispense Refill  . aspirin 81 MG tablet Take 81 mg by mouth as needed (blood thinner).     . budesonide-formoterol (SYMBICORT) 160-4.5 MCG/ACT inhaler Inhale 1 puff into the lungs 2 (two) times daily.    . fluticasone (FLONASE) 50 MCG/ACT nasal spray Place 1 spray into both nostrils daily.    . multivitamin (THERAGRAN) per tablet Take 1 tablet by mouth daily.       No current facility-administered medications for this visit.     Allergies:   Codeine and Hydrocodone   Social History:  The patient  reports that he has never smoked. He has never used smokeless tobacco. He reports that he drinks alcohol. He reports that he does not use drugs.   Family History:  The patient's family history includes Alzheimer's disease in his father; Heart Problems in his mother.    ROS:  Please see the history of present illness.   Otherwise, review of systems is positive for irregular heartbeat.   All other systems are reviewed and negative.    PHYSICAL EXAM: VS:  BP 124/88   Pulse 67   Ht 5\' 8"  (1.727 m)   Wt 167 lb (75.8 kg)   SpO2 96%   BMI 25.39 kg/m  , BMI Body mass index is 25.39 kg/m. GEN: Well nourished, well developed,  in no acute distress  HEENT: normal  Neck: no JVD, carotid bruits, or masses Cardiac: iRRR; no murmurs, rubs, or gallops,no edema  Respiratory:  clear to auscultation bilaterally, normal work of breathing GI: soft, nontender, nondistended, + BS MS: no deformity or atrophy  Skin: warm and dry Neuro:  Strength and sensation are intact Psych: euthymic mood, full affect  EKG:  EKG is not ordered today. Personal review of the ekg ordered 04/05/18 shows this rhythm, Mobitz 1 AV block    Recent Labs: 04/04/2018:  ALT 53; B Natriuretic Peptide 301.3 04/05/2018: BUN 16; Creatinine, Ser 1.09; Magnesium 2.0; Potassium 3.9; Sodium 141; TSH 2.137 04/06/2018: Hemoglobin 14.0; Platelets 180    Lipid Panel     Component Value Date/Time   CHOL 141 04/05/2018 0019   TRIG 27 04/05/2018 0019   HDL 37 (L) 04/05/2018 0019   CHOLHDL 3.8 04/05/2018 0019   VLDL 5 04/05/2018 0019   LDLCALC 99 04/05/2018 0019     Wt Readings from Last 3 Encounters:  05/05/18 167 lb (75.8 kg)  04/06/18 163 lb 9.6 oz (74.2 kg)  07/18/16 167 lb (75.8 kg)      Other studies Reviewed: Additional studies/ records that were reviewed today include: LHC 04/06/18  Review of the above records today demonstrates:   The left ventricular systolic function is normal.  LV end diastolic pressure is normal.  The left ventricular ejection fraction is 55-65% by visual estimate.  There is no aortic valve stenosis.  No significant CAD.  TTE 04/05/18 - Left ventricle: The cavity size was normal. Wall thickness was   normal. Systolic function was normal. The estimated ejection   fraction was in the range of 55% to 60%. Wall motion was normal;   there were no regional wall motion abnormalities. - Aortic valve: Trileaflet; mildly thickened, mildly calcified   leaflets. - Right ventricle: The cavity size was mildly dilated. Wall   thickness was normal. - Tricuspid valve: There was trivial regurgitation.  Holter 04/20/18 - personally reviewed Minimum HR: 28 BPM at 7:36:27 AM(2) Maximum HR: 111 BPM at 9:36:54 AM(2) Average HR: 53 BPM 1% PVCs Sinus rhythm with mobitz I block, 2:1 AV block, and complete heart block during waking hours with narrow complex QRS  ASSESSMENT AND PLAN:  1.  2-1 AV block: Interspersed with Mobitz 1 AV block.  He is currently asymptomatic.  At this point he would like to avoid pacemaker implant.  His QRS is narrow and thus I feel like this is a reasonable thing to do.  He David [name] monitor his symptoms over the next few  months and if he does have fatigue and shortness of breath while exercising, David [name] potentially implant a pacemaker at that time.    Current medicines are reviewed at length with the patient today.   The patient does not have concerns regarding his medicines.  The following changes were made today:  none  Labs/ tests ordered today include:  No orders of the defined types were placed in this encounter.    Disposition:   FU with David [name] 3 months  Signed, David [name] David Leeds, MD  05/05/2018 12:47 PM     [address] [place] [address] [phone] (office) [phone] (fax)

## 2018-05-05 ENCOUNTER — Encounter: Payer: Self-pay | Admitting: Cardiology

## 2018-05-05 ENCOUNTER — Ambulatory Visit (INDEPENDENT_AMBULATORY_CARE_PROVIDER_SITE_OTHER): Payer: Medicare Other | Admitting: Cardiology

## 2018-05-05 VITALS — BP 124/88 | HR 67 | Ht 68.0 in | Wt 167.0 lb

## 2018-05-05 DIAGNOSIS — I441 Atrioventricular block, second degree: Secondary | ICD-10-CM

## 2018-05-05 NOTE — Patient Instructions (Signed)
Medication Instructions:    Your physician recommends that you continue on your current medications as directed. Please refer to the Current Medication list given to you today.  - If you need a refill on your cardiac medications before your next appointment, please call your pharmacy.   Labwork:  None ordered  Testing/Procedures:  None ordered  Follow-Up:  Your physician recommends that you schedule a follow-up appointment in: 3 months with Dr. Camnitz.  Thank you for choosing [facility]!!   Pablo Stauffer, RN [phone]         

## 2018-05-11 ENCOUNTER — Ambulatory Visit: Payer: Medicare Other | Admitting: Cardiology

## 2018-06-04 DIAGNOSIS — S43432A Superior glenoid labrum lesion of left shoulder, initial encounter: Secondary | ICD-10-CM | POA: Diagnosis not present

## 2018-06-04 DIAGNOSIS — M1711 Unilateral primary osteoarthritis, right knee: Secondary | ICD-10-CM | POA: Diagnosis not present

## 2018-06-09 ENCOUNTER — Telehealth: Payer: Self-pay | Admitting: Nurse Practitioner

## 2018-06-09 NOTE — Telephone Encounter (Signed)
   [facility] Pre-operative Risk Assessment    Request for surgical clearance:  1. What type of surgery is being performed?  Right Total Knee Replacement   2. When is this surgery scheduled?  TBD, pending clearance   3. What type of clearance is required (medical clearance vs. Pharmacy clearance to hold med vs. Both)? Medical  4. Are there any medications that need to be held prior to surgery and how long? Aspirin 81 mg, requesting guidance protocol   5. Practice name and name of physician performing surgery? [facility], Dr. Frederik Pear  6. What is your office phone number [phone]    7.   What is your office fax number [phone]  8.   Anesthesia type (None, local, MAC, general) ? Spinal   Emmaline Life 06/09/2018, 1:10 PM  _________________________________________________________________   (provider comments below)

## 2018-06-12 NOTE — Telephone Encounter (Signed)
Dr. Curt Bears,  With pts Heart block will that put restrictions on pt for Rt total knee replacement, under spinal?.  Please respond to pre-op thanks.

## 2018-06-15 NOTE — Telephone Encounter (Signed)
Yes would prefer spinal and avoidance of general anesthesia due to intermittent heart block.

## 2018-06-17 NOTE — Telephone Encounter (Addendum)
   Primary Cardiologist: Will Meredith Leeds, MD  Chart reviewed as part of pre-operative protocol coverage.   The patient has a history of intermittent complete heart block.  Cardiac catheterization in August 2019 demonstrated normal coronary arteries.  Echocardiogram in August 2019 demonstrated normal ejection fraction.  Last seen by Dr. Curt Bears in September 2019.  He is felt to be asymptomatic with his intermittent complete heart block.  The patient prefers to avoid pacemaker unless necessary.  Continued observation has been recommended.  Dr. Curt Bears suggests spinal anesthesia due to hx of intermittent CHB.  General anesthesia should be avoided.    Spoke with patient today and he is doing well.  He denies syncope, near syncope, leg swelling.    Therefore, he may proceed with surgery at acceptable risk with recommendations as outlined.   Call back staff Note sent to surgeon. Please make sure it was received.  Note will be removed from preop pool.  Richardson Dopp, PA-C 06/17/2018, 3:55 PM

## 2018-06-17 NOTE — Telephone Encounter (Signed)
I called and s/w Juliann Pulse at [facility] to confirm if clearance letter was rcv'd . Juliann Pulse said it may not show up until tomorrow. I asked to let us know if clearance was not recv'd. I will removed from the call back pool.

## 2018-06-25 ENCOUNTER — Other Ambulatory Visit: Payer: Self-pay | Admitting: Orthopedic Surgery

## 2018-07-02 ENCOUNTER — Other Ambulatory Visit (HOSPITAL_COMMUNITY): Payer: Medicare Other

## 2018-07-16 NOTE — Telephone Encounter (Signed)
Spoke with Juliann Pulse from requesting office she states that she has received pt's clearance

## 2018-07-16 NOTE — Telephone Encounter (Signed)
Please make sure Juliann Pulse at [facility] rec'd clearance letter. Thanks.

## 2018-07-27 ENCOUNTER — Other Ambulatory Visit: Payer: Self-pay | Admitting: Orthopedic Surgery

## 2018-08-04 NOTE — Patient Instructions (Addendum)
Toshiro Hanken  08/04/2018   Your procedure is scheduled on: 08-10-18   Report to [facility] Main  Entrance    Report to admitting at 5:30AM    Call this number if you have problems the morning of surgery [phone]     Remember: Do not eat food or drink liquids :After Midnight. BRUSH YOUR TEETH MORNING OF SURGERY AND RINSE YOUR MOUTH OUT, NO CHEWING GUM CANDY OR MINTS.     Take these medicines the morning of surgery with A SIP OF WATER: Symbicort, Flonase                                  You may not have any metal on your body including hair pins and              piercings  Do not wear jewelry, make-up, lotions, powders or perfumes, deodorant                      Men may shave face and neck.   Do not bring valuables to the hospital. [place].  Contacts, dentures or bridgework may not be worn into surgery.  Leave suitcase in the car. After surgery it may be brought to your room.                 Please read over the following fact sheets you were given: _____________________________________________________________________             [facility] - Preparing for Surgery Before surgery, you can play an important role.  Because skin is not sterile, your skin needs to be as free of germs as possible.  You can reduce the number of germs on your skin by washing with CHG (chlorahexidine gluconate) soap before surgery.  CHG is an antiseptic cleaner which kills germs and bonds with the skin to continue killing germs even after washing. Please DO NOT use if you have an allergy to CHG or antibacterial soaps.  If your skin becomes reddened/irritated stop using the CHG and inform your nurse when you arrive at Short Stay. Do not shave (including legs and underarms) for at least 48 hours prior to the first CHG shower.  You may shave your face/neck. Please follow these instructions carefully:  1.  Shower with CHG Soap  the night before surgery and the  morning of Surgery.  2.  If you choose to wash your hair, wash your hair first as usual with your  normal  shampoo.  3.  After you shampoo, rinse your hair and body thoroughly to remove the  shampoo.                           4.  Use CHG as you would any other liquid soap.  You can apply chg directly  to the skin and wash                       Gently with a scrungie or clean washcloth.  5.  Apply the CHG Soap to your body ONLY FROM THE NECK DOWN.   Do not use on face/ open  Wound or open sores. Avoid contact with eyes, ears mouth and genitals (private parts).                       Wash face,  Genitals (private parts) with your normal soap.             6.  Wash thoroughly, paying special attention to the area where your surgery  will be performed.  7.  Thoroughly rinse your body with warm water from the neck down.  8.  DO NOT shower/wash with your normal soap after using and rinsing off  the CHG Soap.                9.  Pat yourself dry with a clean towel.            10.  Wear clean pajamas.            11.  Place clean sheets on your bed the night of your first shower and do not  sleep with pets. Day of Surgery : Do not apply any lotions/deodorants the morning of surgery.  Please wear clean clothes to the hospital/surgery center.  FAILURE TO FOLLOW THESE INSTRUCTIONS MAY RESULT IN THE CANCELLATION OF YOUR SURGERY PATIENT SIGNATURE_________________________________  NURSE SIGNATURE__________________________________  ________________________________________________________________________   Adam Phenix  An incentive spirometer is a tool that can help keep your lungs clear and active. This tool measures how well you are filling your lungs with each breath. Taking long deep breaths may help reverse or decrease the chance of developing breathing (pulmonary) problems (especially infection) following:  A long period of time when  you are unable to move or be active. BEFORE THE PROCEDURE   If the spirometer includes an indicator to show your best effort, your nurse or respiratory therapist will set it to a desired goal.  If possible, sit up straight or lean slightly forward. Try not to slouch.  Hold the incentive spirometer in an upright position. INSTRUCTIONS FOR USE  1. Sit on the edge of your bed if possible, or sit up as far as you can in bed or on a chair. 2. Hold the incentive spirometer in an upright position. 3. Breathe out normally. 4. Place the mouthpiece in your mouth and seal your lips tightly around it. 5. Breathe in slowly and as deeply as possible, raising the piston or the ball toward the top of the column. 6. Hold your breath for 3-5 seconds or for as long as possible. Allow the piston or ball to fall to the bottom of the column. 7. Remove the mouthpiece from your mouth and breathe out normally. 8. Rest for a few seconds and repeat Steps 1 through 7 at least 10 times every 1-2 hours when you are awake. Take your time and take a few normal breaths between deep breaths. 9. The spirometer may include an indicator to show your best effort. Use the indicator as a goal to work toward during each repetition. 10. After each set of 10 deep breaths, practice coughing to be sure your lungs are clear. If you have an incision (the cut made at the time of surgery), support your incision when coughing by placing a pillow or rolled up towels firmly against it. Once you are able to get out of bed, walk around indoors and cough well. You may stop using the incentive spirometer when instructed by your caregiver.  RISKS AND COMPLICATIONS  Take your time so you do not get  dizzy or light-headed.  If you are in pain, you may need to take or ask for pain medication before doing incentive spirometry. It is harder to take a deep breath if you are having pain. AFTER USE  Rest and breathe slowly and easily.  It can be  helpful to keep track of a log of your progress. Your caregiver can provide you with a simple table to help with this. If you are using the spirometer at home, follow these instructions: [place] IF:   You are having difficultly using the spirometer.  You have trouble using the spirometer as often as instructed.  Your pain medication is not giving enough relief while using the spirometer.  You develop fever of 100.5 F (38.1 C) or higher. SEEK IMMEDIATE MEDICAL CARE IF:   You cough up bloody sputum that had not been present before.  You develop fever of 102 F (38.9 C) or greater.  You develop worsening pain at or near the incision site. MAKE SURE YOU:   Understand these instructions.  Will watch your condition.  Will get help right away if you are not doing well or get worse. Document Released: 12/30/2006 Document Revised: 11/11/2011 Document Reviewed: 03/02/2007 [facility] Patient Information 2014 [place], [place].   ________________________________________________________________________

## 2018-08-04 NOTE — Progress Notes (Signed)
Cardiac clearance , Richardson Dopp PA-C 06-09-18 epic tele note   Ekg, echo, ct angio chest  04-05-18 epic

## 2018-08-06 ENCOUNTER — Other Ambulatory Visit: Payer: Self-pay | Admitting: Orthopedic Surgery

## 2018-08-06 ENCOUNTER — Encounter (HOSPITAL_COMMUNITY): Payer: Self-pay

## 2018-08-06 ENCOUNTER — Other Ambulatory Visit: Payer: Self-pay

## 2018-08-06 ENCOUNTER — Encounter (HOSPITAL_COMMUNITY)
Admission: RE | Admit: 2018-08-06 | Discharge: 2018-08-06 | Disposition: A | Discharge status: Home / Self Care | Payer: Medicare Other | Source: Ambulatory Visit | Attending: Orthopedic Surgery | Admitting: Orthopedic Surgery

## 2018-08-06 DIAGNOSIS — M1711 Unilateral primary osteoarthritis, right knee: Secondary | ICD-10-CM | POA: Diagnosis present

## 2018-08-06 DIAGNOSIS — Z01812 Encounter for preprocedural laboratory examination: Secondary | ICD-10-CM | POA: Diagnosis not present

## 2018-08-06 DIAGNOSIS — Z7982 Long term (current) use of aspirin: Secondary | ICD-10-CM | POA: Insufficient documentation

## 2018-08-06 DIAGNOSIS — Z7951 Long term (current) use of inhaled steroids: Secondary | ICD-10-CM | POA: Diagnosis not present

## 2018-08-06 DIAGNOSIS — M25561 Pain in right knee: Secondary | ICD-10-CM | POA: Insufficient documentation

## 2018-08-06 DIAGNOSIS — Z79899 Other long term (current) drug therapy: Secondary | ICD-10-CM | POA: Insufficient documentation

## 2018-08-06 HISTORY — DX: Atrioventricular block, second degree: I44.1

## 2018-08-06 HISTORY — DX: Unilateral primary osteoarthritis, right knee: M17.11

## 2018-08-06 LAB — CBC WITH DIFFERENTIAL/PLATELET
Abs Immature Granulocytes: 0.04 K/uL (ref 0.00–0.07)
Basophils Absolute: 0.1 K/uL (ref 0.0–0.1)
Basophils Relative: 1 %
Eosinophils Absolute: 0.4 K/uL (ref 0.0–0.5)
Eosinophils Relative: 7 %
HCT: 48.8 % (ref 39.0–52.0)
Hemoglobin: 15.9 g/dL (ref 13.0–17.0)
Immature Granulocytes: 1 %
Lymphocytes Relative: 27 %
Lymphs Abs: 1.7 K/uL (ref 0.7–4.0)
MCH: 31.1 pg (ref 26.0–34.0)
MCHC: 32.6 g/dL (ref 30.0–36.0)
MCV: 95.5 fL (ref 80.0–100.0)
Monocytes Absolute: 0.5 K/uL (ref 0.1–1.0)
Monocytes Relative: 8 %
Neutro Abs: 3.5 K/uL (ref 1.7–7.7)
Neutrophils Relative %: 56 %
Platelets: 197 K/uL (ref 150–400)
RBC: 5.11 MIL/uL (ref 4.22–5.81)
RDW: 14.5 % (ref 11.5–15.5)
WBC: 6.2 K/uL (ref 4.0–10.5)
nRBC: 0 % (ref 0.0–0.2)

## 2018-08-06 LAB — BASIC METABOLIC PANEL WITH GFR
Anion gap: 7 (ref 5–15)
BUN: 30 mg/dL — ABNORMAL HIGH (ref 8–23)
CO2: 29 mmol/L (ref 22–32)
Calcium: 9.4 mg/dL (ref 8.9–10.3)
Chloride: 106 mmol/L (ref 98–111)
Creatinine, Ser: 1.18 mg/dL (ref 0.61–1.24)
GFR calc Af Amer: >60 mL/min (ref >60)
GFR calc non Af Amer: >60 mL/min (ref >60)
Glucose, Bld: 84 mg/dL (ref 70–99)
Potassium: 5 mmol/L (ref 3.5–5.1)
Sodium: 142 mmol/L (ref 135–145)

## 2018-08-06 LAB — URINALYSIS, ROUTINE W REFLEX MICROSCOPIC
Bilirubin Urine: NEGATIVE
Glucose, UA: NEGATIVE mg/dL
Hgb urine dipstick: NEGATIVE
Ketones, ur: NEGATIVE mg/dL
Leukocytes, UA: NEGATIVE
Nitrite: NEGATIVE
Protein, ur: NEGATIVE mg/dL
Specific Gravity, Urine: 1.019 (ref 1.005–1.030)
pH: 5 (ref 5.0–8.0)

## 2018-08-06 LAB — SURGICAL PCR SCREEN
MRSA, PCR: NEGATIVE
Staphylococcus aureus: POSITIVE — AB

## 2018-08-06 LAB — APTT: aPTT: 28 seconds (ref 24–36)

## 2018-08-06 LAB — PROTIME-INR
INR: 0.99
Prothrombin Time: 13 s (ref 11.4–15.2)

## 2018-08-06 NOTE — Progress Notes (Signed)
BMP ROUTED [place]

## 2018-08-06 NOTE — H&P (Signed)
TOTAL KNEE ADMISSION H&P  Patient is being admitted for right total knee arthroplasty.  Subjective:  Chief Complaint:right knee pain.  HPI: David [name], 72 y.o. male, has a history of pain and functional disability in the right knee due to arthritis and has failed non-surgical conservative treatments for greater than 12 weeks to includeNSAID's and/or analgesics, flexibility and strengthening excercises, weight reduction as appropriate and activity modification.  Onset of symptoms was gradual, starting 6 years ago with gradually worsening course since that time. The patient noted no past surgery on the right knee(s).  Patient currently rates pain in the right knee(s) at 10 out of 10 with activity. Patient has night pain, worsening of pain with activity and weight bearing, pain that interferes with activities of daily living, pain with passive range of motion, crepitus and joint swelling.  Patient has evidence of subchondral sclerosis, periarticular osteophytes, joint subluxation and joint space narrowing by imaging studies.  There is no active infection.  Patient Active Problem List   Diagnosis Date Noted  . Mobitz type 1 second degree atrioventricular block   . Edema extremities   . Elevated troponin   . Complete heart block ([place]) 04/04/2018  . Cough variant asthma 05/29/2016  . Insomnia 07/14/2014  . Bilateral inguinal hernia (BIH) 10/16/2012  . OA (osteoarthritis) 09/24/2012  . Reactive airway disease 09/24/2012   Past Medical History:  Diagnosis Date  . Asthma    exercise-induced; prn inhaler  . Rotator cuff tear, right    impingement, AC arthrosis    Past Surgical History:  Procedure Laterality Date  . LEFT HEART CATH AND CORONARY ANGIOGRAPHY N/A 04/06/2018   Procedure: LEFT HEART CATH AND CORONARY ANGIOGRAPHY;  Surgeon: Jettie Booze, MD;  Location: [facility];  Service: Cardiovascular;  Laterality: N/A;  . LUMBAR LAMINECTOMY  age 29s  . SHOULDER ARTHROSCOPY   08/06/2011   Procedure: ARTHROSCOPY SHOULDER;  Surgeon: Cammie Sickle., MD;  Location: [facility];  Service: Orthopedics;  Laterality: Right;  with subacromial decompression, arthroscopic subscapularis repair, and rotator cuff repair  . TRIGGER FINGER RELEASE Right 06/30/2014   Procedure: RELEASE A-1 PULLEY RIGHT THUMB;  Surgeon: Daryll Brod, MD;  Location: [facility];  Service: Orthopedics;  Laterality: Right;  ANESTHESIA:  IV REGIONAL FAB    No current facility-administered medications for this encounter.    Current Outpatient Medications  Medication Sig Dispense Refill Last Dose  . aspirin 81 MG tablet Take 81 mg by mouth daily.    Taking  . budesonide-formoterol (SYMBICORT) 160-4.5 MCG/ACT inhaler Inhale 1 puff into the lungs daily.    Taking  . fluticasone (FLONASE) 50 MCG/ACT nasal spray Place 1 spray into both nostrils daily as needed for allergies.    Taking  . multivitamin (THERAGRAN) per tablet Take 1 tablet by mouth daily.     Taking   Allergies  Allergen Reactions  . Codeine Itching    Social History   Tobacco Use  . Smoking status: Never Smoker  . Smokeless tobacco: Never Used  Substance Use Topics  . Alcohol use: Yes    Comment: occ. beer    Family History  Problem Relation Age of Onset  . Heart Problems Mother        had pacemaker, lived to 52  . Alzheimer's disease Father      Review of Systems  Constitutional: Negative.   HENT: Positive for sinus pain.   Eyes: Negative.   Respiratory: Negative.   Cardiovascular: Negative.  Gastrointestinal: Negative.   Genitourinary: Negative.   Musculoskeletal: Positive for joint pain.  Skin: Negative.   Neurological: Negative.   Endo/Heme/Allergies: Negative.   Psychiatric/Behavioral: Negative.     Objective:  Physical Exam  Constitutional: He is oriented to person, place, and time. He appears well-developed and well-nourished.  HENT:  Head: Normocephalic and atraumatic.   Eyes: Pupils are equal, round, and reactive to light.  Neck: Normal range of motion. Neck supple.  Cardiovascular: Intact distal pulses.  Respiratory: Effort normal.  Musculoskeletal: He exhibits tenderness.  Obvious varus deformity to the right knee, tender along the medial joint line.  Range of motion 5/130.  Collateral ligaments are stable.  Neurovascular intact distally.  Toes are pink and well-perfused.  There is crepitus as you taken through a range of motion.    Neurological: He is alert and oriented to person, place, and time.  Skin: Skin is warm and dry.  Psychiatric: He has a normal mood and affect. His behavior is normal. Judgment and thought content normal.    Vital signs in last 24 hours: BP: ()/()  Arterial Line BP: ()/()   Labs:   Estimated body mass index is 25.39 kg/m as calculated from the following:   Height as of 05/05/18: 5\' 8"  (1.727 m).   Weight as of 05/05/18: 75.8 kg.   Imaging Review Plain radiographs demonstrate  AP, [place], lateral and sunrise x-rays of the right knee show end-stage arthritis bone-on-bone and there is lateral subluxation of tibia beneath the femur about 5 or 6 mm.     Preoperative templating of the joint replacement has been completed, documented, and submitted to the Operating Room personnel in order to optimize intra-operative equipment management.   Anticipated LOS equal to or greater than 2 midnights due to - Age 31 and older with one or more of the following:  - Obesity  - Expected need for hospital services (PT, OT, Nursing) required for safe  discharge  - Anticipated need for postoperative skilled nursing care or inpatient rehab  - Active co-morbidities: Cardiac Arrhythmia    Assessment/Plan:  End stage arthritis, right knee   The patient history, physical examination, clinical judgment of the provider and imaging studies are consistent with end stage degenerative joint disease of the right knee(s) and total knee  arthroplasty is deemed medically necessary. The treatment options including medical management, injection therapy arthroscopy and arthroplasty were discussed at length. The risks and benefits of total knee arthroplasty were presented and reviewed. The risks due to aseptic loosening, infection, stiffness, patella tracking problems, thromboembolic complications and other imponderables were discussed. The patient acknowledged the explanation, agreed to proceed with the plan and consent was signed. Patient is being admitted for inpatient treatment for surgery, pain control, PT, OT, prophylactic antibiotics, VTE prophylaxis, progressive ambulation and ADL's and discharge planning. The patient is planning to be discharged home with home health services.

## 2018-08-06 NOTE — Care Plan (Signed)
Spoke with patient. Will discharge to home with family and go straight to OPPT per his request. Appointment is set up at [facility] for 08/13/18  Ladell Heads, [place]

## 2018-08-07 LAB — ABO/RH: ABO/RH(D): B POS

## 2018-08-09 MED ORDER — TRANEXAMIC ACID 1000 MG/10ML IV SOLN
2000.0000 mg | INTRAVENOUS | Status: DC
  Filled 2018-08-09: qty 20

## 2018-08-09 MED ORDER — BUPIVACAINE LIPOSOME 1.3 % IJ SUSP
20.0000 mL | INTRAMUSCULAR | Status: DC
  Filled 2018-08-09: qty 20

## 2018-08-09 NOTE — Anesthesia Preprocedure Evaluation (Addendum)
Anesthesia Evaluation  Patient identified by MRN, date of birth, ID band Patient awake    Reviewed: Allergy & Precautions, NPO status , Patient's Chart, lab work & pertinent test results  History of Anesthesia Complications (+) POST - OP SPINAL HEADACHE  Airway Mallampati: II  TM Distance: >3 FB Neck ROM: Full    Dental no notable dental hx. (+) Teeth Intact, Dental Advisory Given   Pulmonary asthma ,    Pulmonary exam normal breath sounds clear to auscultation       Cardiovascular Normal cardiovascular exam+ dysrhythmias (Mobitz Type 1)  Rhythm:Regular Rate:Normal  LHC 04/2018  The left ventricular systolic function is normal.  LV end diastolic pressure is normal.  The left ventricular ejection fraction is 55-65%   There is no aortic valve stenosis.  No significant CAD.  TTE 04/2018 - Left ventricle: The cavity size was normal. Wall thickness was normal. Systolic function was normal. The estimated ejection fraction was in the range of 55% to 60%. Wall motion was normal; there were no regional wall motion abnormalities. - Aortic valve: Trileaflet; mildly thickened, mildly calcified   leaflets. - Right ventricle: The cavity size was mildly dilated. Wall   thickness was normal. - Tricuspid valve: There was trivial regurgitation.  Holter Monitor 04/2018 1% PVCs Sinus rhythm with mobitz I block, 2:1 AV block, and complete heart block during waking hours with narrow complex QRS   Neuro/Psych negative neurological ROS  negative psych ROS   GI/Hepatic negative GI ROS, Neg liver ROS,   Endo/Other  negative endocrine ROS  Renal/GU negative Renal ROS  negative genitourinary   Musculoskeletal  (+) Arthritis , Osteoarthritis,    Abdominal   Peds  Hematology negative hematology ROS (+)   Anesthesia Other Findings   Reproductive/Obstetrics                            Anesthesia  Physical Anesthesia Plan  ASA: III  Anesthesia Plan: Regional and Spinal   Post-op Pain Management:    Induction: Intravenous  PONV Risk Score and Plan: 1 and Treatment may vary due to age or medical condition  Airway Management Planned: Natural Airway  Additional Equipment:   Intra-op Plan:   Post-operative Plan:   Informed Consent: I have reviewed the patients History and Physical, chart, labs and discussed the procedure including the risks, benefits and alternatives for the proposed anesthesia with the patient or authorized representative who has indicated his/her understanding and acceptance.   Dental advisory given  Plan Discussed with: CRNA  Anesthesia Plan Comments:         Anesthesia Quick Evaluation

## 2018-08-10 ENCOUNTER — Ambulatory Visit (HOSPITAL_COMMUNITY): Payer: Medicare Other | Admitting: Anesthesiology

## 2018-08-10 ENCOUNTER — Other Ambulatory Visit: Payer: Self-pay

## 2018-08-10 ENCOUNTER — Ambulatory Visit (HOSPITAL_COMMUNITY)
Admission: RE | Admit: 2018-08-10 | Discharge: 2018-08-11 | Disposition: A | Discharge status: Home / Self Care | Payer: Medicare Other | Source: Ambulatory Visit | Attending: Orthopedic Surgery | Admitting: Orthopedic Surgery

## 2018-08-10 ENCOUNTER — Encounter (HOSPITAL_COMMUNITY): Payer: Self-pay | Admitting: *Deleted

## 2018-08-10 ENCOUNTER — Encounter (HOSPITAL_COMMUNITY)
Admission: RE | Disposition: A | Discharge status: Home / Self Care | Payer: Self-pay | Source: Ambulatory Visit | Attending: Orthopedic Surgery

## 2018-08-10 ENCOUNTER — Other Ambulatory Visit: Payer: Self-pay | Admitting: *Deleted

## 2018-08-10 ENCOUNTER — Encounter: Payer: Self-pay | Admitting: *Deleted

## 2018-08-10 DIAGNOSIS — I442 Atrioventricular block, complete: Secondary | ICD-10-CM | POA: Insufficient documentation

## 2018-08-10 DIAGNOSIS — D62 Acute posthemorrhagic anemia: Secondary | ICD-10-CM | POA: Insufficient documentation

## 2018-08-10 DIAGNOSIS — Z79899 Other long term (current) drug therapy: Secondary | ICD-10-CM | POA: Diagnosis not present

## 2018-08-10 DIAGNOSIS — G4700 Insomnia, unspecified: Secondary | ICD-10-CM | POA: Diagnosis not present

## 2018-08-10 DIAGNOSIS — Z01818 Encounter for other preprocedural examination: Secondary | ICD-10-CM

## 2018-08-10 DIAGNOSIS — M1711 Unilateral primary osteoarthritis, right knee: Secondary | ICD-10-CM | POA: Diagnosis not present

## 2018-08-10 DIAGNOSIS — Z7982 Long term (current) use of aspirin: Secondary | ICD-10-CM | POA: Insufficient documentation

## 2018-08-10 DIAGNOSIS — J45991 Cough variant asthma: Secondary | ICD-10-CM | POA: Diagnosis not present

## 2018-08-10 DIAGNOSIS — G8918 Other acute postprocedural pain: Secondary | ICD-10-CM | POA: Diagnosis not present

## 2018-08-10 HISTORY — PX: TOTAL KNEE ARTHROPLASTY: SHX125

## 2018-08-10 LAB — TYPE AND SCREEN
ABO/RH(D): B POS
Antibody Screen: NEGATIVE

## 2018-08-10 SURGERY — ARTHROPLASTY, KNEE, TOTAL
Anesthesia: Regional | Site: Knee | Laterality: Right

## 2018-08-10 MED ORDER — ROPIVACAINE HCL 7.5 MG/ML IJ SOLN
INTRAMUSCULAR | Status: DC | PRN
  Administered 2018-08-10: 20 mL via PERINEURAL

## 2018-08-10 MED ORDER — DIPHENHYDRAMINE HCL 12.5 MG/5ML PO ELIX: 12.5000 mg | ORAL_SOLUTION | ORAL | Status: DC | PRN

## 2018-08-10 MED ORDER — PHENOL 1.4 % MT LIQD
1.0000 | OROMUCOSAL | Status: DC | PRN
  Filled 2018-08-10: qty 177

## 2018-08-10 MED ORDER — MOMETASONE FURO-FORMOTEROL FUM 200-5 MCG/ACT IN AERO
2.0000 | INHALATION_SPRAY | Freq: Two times a day (BID) | RESPIRATORY_TRACT | Status: DC
  Filled 2018-08-10: qty 8.8

## 2018-08-10 MED ORDER — POLYETHYLENE GLYCOL 3350 17 G PO PACK: 17.0000 g | PACK | Freq: Every day | ORAL | Status: DC | PRN

## 2018-08-10 MED ORDER — ONDANSETRON HCL 4 MG/2ML IJ SOLN
INTRAMUSCULAR | Status: AC
  Filled 2018-08-10: qty 2

## 2018-08-10 MED ORDER — PANTOPRAZOLE SODIUM 40 MG PO TBEC
40.0000 mg | DELAYED_RELEASE_TABLET | Freq: Every day | ORAL | Status: DC
  Administered 2018-08-10: 40 mg via ORAL
  Filled 2018-08-10 (×2): qty 1

## 2018-08-10 MED ORDER — ONDANSETRON HCL 4 MG/2ML IJ SOLN
INTRAMUSCULAR | Status: DC | PRN
  Administered 2018-08-10: 4 mg via INTRAVENOUS

## 2018-08-10 MED ORDER — PROPOFOL 10 MG/ML IV BOLUS
INTRAVENOUS | Status: DC | PRN
  Administered 2018-08-10: 10 mg via INTRAVENOUS
  Administered 2018-08-10: 20 mg via INTRAVENOUS

## 2018-08-10 MED ORDER — SODIUM CHLORIDE (PF) 0.9 % IJ SOLN
INTRAMUSCULAR | Status: AC
  Filled 2018-08-10: qty 20

## 2018-08-10 MED ORDER — FENTANYL CITRATE (PF) 100 MCG/2ML IJ SOLN: 25.0000 ug | INTRAMUSCULAR | Status: DC | PRN

## 2018-08-10 MED ORDER — CHLORHEXIDINE GLUCONATE 4 % EX LIQD: 60.0000 mL | Freq: Once | CUTANEOUS | Status: DC

## 2018-08-10 MED ORDER — DOCUSATE SODIUM 100 MG PO CAPS
100.0000 mg | ORAL_CAPSULE | Freq: Two times a day (BID) | ORAL | Status: DC
  Administered 2018-08-10 – 2018-08-11 (×2): 100 mg via ORAL
  Filled 2018-08-10 (×2): qty 1

## 2018-08-10 MED ORDER — HYDROMORPHONE HCL 2 MG PO TABS: 2.0000 mg | ORAL_TABLET | Freq: Four times a day (QID) | ORAL | 0 refills | Status: DC | PRN

## 2018-08-10 MED ORDER — FLUTICASONE PROPIONATE 50 MCG/ACT NA SUSP
1.0000 | Freq: Every day | NASAL | Status: DC | PRN
  Filled 2018-08-10: qty 16

## 2018-08-10 MED ORDER — BUPIVACAINE LIPOSOME 1.3 % IJ SUSP
INTRAMUSCULAR | Status: DC | PRN
  Administered 2018-08-10: 20 mL

## 2018-08-10 MED ORDER — HYDROMORPHONE HCL 1 MG/ML IJ SOLN: 0.5000 mg | INTRAMUSCULAR | Status: DC | PRN

## 2018-08-10 MED ORDER — SODIUM CHLORIDE 0.9 % IR SOLN
Status: DC | PRN
  Administered 2018-08-10: 1000 mL

## 2018-08-10 MED ORDER — LACTATED RINGERS IV SOLN
INTRAVENOUS | Status: DC
  Administered 2018-08-10 (×2): via INTRAVENOUS

## 2018-08-10 MED ORDER — EPHEDRINE 5 MG/ML INJ
INTRAVENOUS | Status: AC
  Filled 2018-08-10: qty 10

## 2018-08-10 MED ORDER — FENTANYL CITRATE (PF) 100 MCG/2ML IJ SOLN
INTRAMUSCULAR | Status: AC
  Filled 2018-08-10: qty 2

## 2018-08-10 MED ORDER — BISACODYL 5 MG PO TBEC: 5.0000 mg | DELAYED_RELEASE_TABLET | Freq: Every day | ORAL | Status: DC | PRN

## 2018-08-10 MED ORDER — GABAPENTIN 300 MG PO CAPS
300.0000 mg | ORAL_CAPSULE | Freq: Three times a day (TID) | ORAL | Status: DC
  Administered 2018-08-10 – 2018-08-11 (×4): 300 mg via ORAL
  Filled 2018-08-10 (×4): qty 1

## 2018-08-10 MED ORDER — PROPOFOL 10 MG/ML IV BOLUS
INTRAVENOUS | Status: AC
  Filled 2018-08-10: qty 20

## 2018-08-10 MED ORDER — BUPIVACAINE LIPOSOME 1.3 % IJ SUSP: 20.0000 mL | Freq: Once | INTRAMUSCULAR | Status: DC

## 2018-08-10 MED ORDER — CEFAZOLIN SODIUM-DEXTROSE 2-4 GM/100ML-% IV SOLN
2.0000 g | INTRAVENOUS | Status: AC
  Administered 2018-08-10: 2 g via INTRAVENOUS
  Filled 2018-08-10: qty 100

## 2018-08-10 MED ORDER — ONDANSETRON HCL 4 MG/2ML IJ SOLN: 4.0000 mg | Freq: Four times a day (QID) | INTRAMUSCULAR | Status: DC | PRN

## 2018-08-10 MED ORDER — SODIUM CHLORIDE 0.9 % IV SOLN
INTRAVENOUS | Status: DC | PRN
  Administered 2018-08-10: 25 ug/min via INTRAVENOUS

## 2018-08-10 MED ORDER — CELECOXIB 200 MG PO CAPS
200.0000 mg | ORAL_CAPSULE | Freq: Two times a day (BID) | ORAL | Status: DC
  Administered 2018-08-10 – 2018-08-11 (×3): 200 mg via ORAL
  Filled 2018-08-10 (×3): qty 1

## 2018-08-10 MED ORDER — METOCLOPRAMIDE HCL 5 MG/ML IJ SOLN: 5.0000 mg | Freq: Three times a day (TID) | INTRAMUSCULAR | Status: DC | PRN

## 2018-08-10 MED ORDER — MENTHOL 3 MG MT LOZG: 1.0000 | LOZENGE | OROMUCOSAL | Status: DC | PRN

## 2018-08-10 MED ORDER — FENTANYL CITRATE (PF) 100 MCG/2ML IJ SOLN
INTRAMUSCULAR | Status: DC | PRN
  Administered 2018-08-10: 50 ug via INTRAVENOUS

## 2018-08-10 MED ORDER — WATER FOR IRRIGATION, STERILE IR SOLN
Status: DC | PRN
  Administered 2018-08-10: 2000 mL

## 2018-08-10 MED ORDER — MIDAZOLAM HCL 2 MG/2ML IJ SOLN
INTRAMUSCULAR | Status: AC
  Filled 2018-08-10: qty 2

## 2018-08-10 MED ORDER — MIDAZOLAM HCL 5 MG/5ML IJ SOLN
INTRAMUSCULAR | Status: DC | PRN
  Administered 2018-08-10: 2 mg via INTRAVENOUS

## 2018-08-10 MED ORDER — PHENYLEPHRINE HCL 10 MG/ML IJ SOLN
INTRAMUSCULAR | Status: AC
  Filled 2018-08-10: qty 1

## 2018-08-10 MED ORDER — BUPIVACAINE-EPINEPHRINE (PF) 0.25% -1:200000 IJ SOLN
INTRAMUSCULAR | Status: AC
  Filled 2018-08-10: qty 30

## 2018-08-10 MED ORDER — CLONIDINE HCL (ANALGESIA) 100 MCG/ML EP SOLN
EPIDURAL | Status: DC | PRN
  Administered 2018-08-10: 50 ug

## 2018-08-10 MED ORDER — TRANEXAMIC ACID-NACL 1000-0.7 MG/100ML-% IV SOLN
1000.0000 mg | Freq: Once | INTRAVENOUS | Status: AC
  Administered 2018-08-10: 1000 mg via INTRAVENOUS
  Filled 2018-08-10: qty 100

## 2018-08-10 MED ORDER — METHOCARBAMOL 500 MG IVPB - SIMPLE MED
500.0000 mg | Freq: Four times a day (QID) | INTRAVENOUS | Status: DC | PRN
  Filled 2018-08-10: qty 50

## 2018-08-10 MED ORDER — OXYCODONE HCL 5 MG PO TABS
5.0000 mg | ORAL_TABLET | ORAL | Status: DC | PRN
  Administered 2018-08-10: 10 mg via ORAL
  Administered 2018-08-10: 5 mg via ORAL
  Filled 2018-08-10: qty 2
  Filled 2018-08-10: qty 1

## 2018-08-10 MED ORDER — SODIUM CHLORIDE (PF) 0.9 % IJ SOLN
INTRAMUSCULAR | Status: AC
  Filled 2018-08-10: qty 50

## 2018-08-10 MED ORDER — DEXAMETHASONE SODIUM PHOSPHATE 10 MG/ML IJ SOLN
INTRAMUSCULAR | Status: AC
  Filled 2018-08-10: qty 1

## 2018-08-10 MED ORDER — EPHEDRINE SULFATE 50 MG/ML IJ SOLN
INTRAMUSCULAR | Status: DC | PRN
  Administered 2018-08-10 (×2): 10 mg via INTRAVENOUS

## 2018-08-10 MED ORDER — CHLORHEXIDINE GLUCONATE CLOTH 2 % EX PADS
6.0000 | MEDICATED_PAD | Freq: Every day | CUTANEOUS | Status: DC
  Administered 2018-08-10: 6 via TOPICAL

## 2018-08-10 MED ORDER — BUPIVACAINE-EPINEPHRINE (PF) 0.25% -1:200000 IJ SOLN
INTRAMUSCULAR | Status: DC | PRN
  Administered 2018-08-10: 30 mL

## 2018-08-10 MED ORDER — TRANEXAMIC ACID-NACL 1000-0.7 MG/100ML-% IV SOLN: 1000.0000 mg | INTRAVENOUS | Status: DC

## 2018-08-10 MED ORDER — ACETAMINOPHEN 325 MG PO TABS: 325.0000 mg | ORAL_TABLET | Freq: Four times a day (QID) | ORAL | Status: DC | PRN

## 2018-08-10 MED ORDER — TIZANIDINE HCL 2 MG PO TABS: 2.0000 mg | ORAL_TABLET | Freq: Four times a day (QID) | ORAL | 0 refills | Status: DC | PRN

## 2018-08-10 MED ORDER — ASPIRIN 81 MG PO CHEW
81.0000 mg | CHEWABLE_TABLET | Freq: Two times a day (BID) | ORAL | Status: DC
  Administered 2018-08-10 – 2018-08-11 (×2): 81 mg via ORAL
  Filled 2018-08-10 (×2): qty 1

## 2018-08-10 MED ORDER — METHOCARBAMOL 500 MG PO TABS
500.0000 mg | ORAL_TABLET | Freq: Four times a day (QID) | ORAL | Status: DC | PRN
  Administered 2018-08-10: 500 mg via ORAL
  Filled 2018-08-10 (×2): qty 1

## 2018-08-10 MED ORDER — ALUM & MAG HYDROXIDE-SIMETH 200-200-20 MG/5ML PO SUSP: 30.0000 mL | ORAL | Status: DC | PRN

## 2018-08-10 MED ORDER — METOCLOPRAMIDE HCL 5 MG PO TABS: 5.0000 mg | ORAL_TABLET | Freq: Three times a day (TID) | ORAL | Status: DC | PRN

## 2018-08-10 MED ORDER — TRANEXAMIC ACID-NACL 1000-0.7 MG/100ML-% IV SOLN
1000.0000 mg | INTRAVENOUS | Status: AC
  Administered 2018-08-10: 1000 mg via INTRAVENOUS
  Filled 2018-08-10: qty 100

## 2018-08-10 MED ORDER — ASPIRIN EC 81 MG PO TBEC: 81.0000 mg | DELAYED_RELEASE_TABLET | Freq: Two times a day (BID) | ORAL | 0 refills | Status: DC

## 2018-08-10 MED ORDER — KCL IN DEXTROSE-NACL 20-5-0.45 MEQ/L-%-% IV SOLN
INTRAVENOUS | Status: DC
  Administered 2018-08-10 (×2): via INTRAVENOUS
  Filled 2018-08-10 (×3): qty 1000

## 2018-08-10 MED ORDER — DEXAMETHASONE SODIUM PHOSPHATE 10 MG/ML IJ SOLN
INTRAMUSCULAR | Status: DC | PRN
  Administered 2018-08-10: 10 mg via INTRAVENOUS

## 2018-08-10 MED ORDER — PROPOFOL 10 MG/ML IV BOLUS
INTRAVENOUS | Status: AC
  Filled 2018-08-10: qty 60

## 2018-08-10 MED ORDER — BUPIVACAINE IN DEXTROSE 0.75-8.25 % IT SOLN
INTRATHECAL | Status: DC | PRN
  Administered 2018-08-10: 1.4 mL via INTRATHECAL

## 2018-08-10 MED ORDER — ONDANSETRON HCL 4 MG PO TABS: 4.0000 mg | ORAL_TABLET | Freq: Four times a day (QID) | ORAL | Status: DC | PRN

## 2018-08-10 MED ORDER — MUPIROCIN 2 % EX OINT
1.0000 "application " | TOPICAL_OINTMENT | Freq: Two times a day (BID) | CUTANEOUS | Status: DC
  Administered 2018-08-10 – 2018-08-11 (×3): 1 via NASAL
  Filled 2018-08-10: qty 22

## 2018-08-10 MED ORDER — PROPOFOL 500 MG/50ML IV EMUL
INTRAVENOUS | Status: DC | PRN
  Administered 2018-08-10: 100 ug/kg/min via INTRAVENOUS

## 2018-08-10 MED ORDER — SODIUM CHLORIDE (PF) 0.9 % IJ SOLN
INTRAMUSCULAR | Status: DC | PRN
  Administered 2018-08-10: 70 mL

## 2018-08-10 MED ORDER — TRANEXAMIC ACID 1000 MG/10ML IV SOLN
INTRAVENOUS | Status: DC | PRN
  Administered 2018-08-10: 2000 mg via TOPICAL

## 2018-08-10 MED ORDER — FLEET ENEMA 7-19 GM/118ML RE ENEM: 1.0000 | ENEMA | Freq: Once | RECTAL | Status: DC | PRN

## 2018-08-10 SURGICAL SUPPLY — 63 items
ATTUNE MED DOME PAT 41 KNEE (Knees) ×1 IMPLANT
ATTUNE PS FEM RT SZ 7 CEM KNEE (Femur) ×1 IMPLANT
ATTUNE PSRP INSR SZ7 5 KNEE (Insert) ×1 IMPLANT
BAG DECANTER FOR FLEXI CONT (MISCELLANEOUS) ×2 IMPLANT
BAG SPEC THK2 15X12 ZIP CLS (MISCELLANEOUS) ×1
BAG ZIPLOCK 12X15 (MISCELLANEOUS) ×2 IMPLANT
BANDAGE ELASTIC 6 VELCRO ST LF (GAUZE/BANDAGES/DRESSINGS) ×1 IMPLANT
BASE TIBIAL ROT PLAT SZ 8 KNEE (Knees) IMPLANT
BLADE SAG 18X100X1.27 (BLADE) ×2 IMPLANT
BLADE SAW SGTL 11.0X1.19X90.0M (BLADE) ×2 IMPLANT
BLADE SURG SZ10 CARB STEEL (BLADE) ×4 IMPLANT
BNDG CMPR MED 10X6 ELC LF (GAUZE/BANDAGES/DRESSINGS)
BNDG ELASTIC 6X10 VLCR STRL LF (GAUZE/BANDAGES/DRESSINGS) ×1 IMPLANT
BOWL SMART MIX CTS (DISPOSABLE) ×2 IMPLANT
BSPLAT TIB 8 CMNT ROT PLAT STR (Knees) ×1 IMPLANT
CEMENT HV SMART SET (Cement) ×4 IMPLANT
COVER SURGICAL LIGHT HANDLE (MISCELLANEOUS) ×2 IMPLANT
COVER WAND RF STERILE (DRAPES) ×1 IMPLANT
CUFF TOURN SGL QUICK 34 (TOURNIQUET CUFF) ×2
CUFF TRNQT CYL 34X4X40X1 (TOURNIQUET CUFF) ×1 IMPLANT
DECANTER SPIKE VIAL GLASS SM (MISCELLANEOUS) ×6 IMPLANT
DRAPE U-SHAPE 47X51 STRL (DRAPES) ×2 IMPLANT
DRSG AQUACEL AG ADV 3.5X10 (GAUZE/BANDAGES/DRESSINGS) ×2 IMPLANT
DURAPREP 26ML APPLICATOR (WOUND CARE) ×2 IMPLANT
ELECT REM PT RETURN 15FT ADLT (MISCELLANEOUS) ×2 IMPLANT
GLOVE BIO SURGEON STRL SZ7.5 (GLOVE) ×2 IMPLANT
GLOVE BIO SURGEON STRL SZ8.5 (GLOVE) ×2 IMPLANT
GLOVE BIOGEL PI IND STRL 6.5 (GLOVE) IMPLANT
GLOVE BIOGEL PI IND STRL 7.0 (GLOVE) IMPLANT
GLOVE BIOGEL PI IND STRL 7.5 (GLOVE) IMPLANT
GLOVE BIOGEL PI IND STRL 8 (GLOVE) ×1 IMPLANT
GLOVE BIOGEL PI IND STRL 9 (GLOVE) ×1 IMPLANT
GLOVE BIOGEL PI INDICATOR 6.5 (GLOVE) ×1
GLOVE BIOGEL PI INDICATOR 7.0 (GLOVE) ×4
GLOVE BIOGEL PI INDICATOR 7.5 (GLOVE) ×1
GLOVE BIOGEL PI INDICATOR 8 (GLOVE) ×1
GLOVE BIOGEL PI INDICATOR 9 (GLOVE) ×1
GOWN SPEC L4 XLG W/TWL (GOWN DISPOSABLE) ×2 IMPLANT
GOWN STRL REUS W/TWL XL LVL3 (GOWN DISPOSABLE) ×4 IMPLANT
HANDPIECE INTERPULSE COAX TIP (DISPOSABLE) ×2
HOOD PEEL AWAY FLYTE STAYCOOL (MISCELLANEOUS) ×6 IMPLANT
NDL HYPO 21X1.5 SAFETY (NEEDLE) ×2 IMPLANT
NEEDLE HYPO 21X1.5 SAFETY (NEEDLE) ×4 IMPLANT
NS IRRIG 1000ML POUR BTL (IV SOLUTION) ×2 IMPLANT
PACK ICE MAXI GEL EZY WRAP (MISCELLANEOUS) ×2 IMPLANT
PACK TOTAL KNEE CUSTOM (KITS) ×2 IMPLANT
PIN STEINMAN FIXATION KNEE (PIN) ×1 IMPLANT
PIN THREADED HEADED SIGMA (PIN) ×1 IMPLANT
PROTECTOR NERVE ULNAR (MISCELLANEOUS) ×2 IMPLANT
SET HNDPC FAN SPRY TIP SCT (DISPOSABLE) ×1 IMPLANT
STAPLER VISISTAT 35W (STAPLE) IMPLANT
SUT VIC AB 1 CTX 36 (SUTURE) ×2
SUT VIC AB 1 CTX36XBRD ANBCTR (SUTURE) ×1 IMPLANT
SUT VIC AB 2-0 CT1 27 (SUTURE) ×2
SUT VIC AB 2-0 CT1 TAPERPNT 27 (SUTURE) ×1 IMPLANT
SUT VIC AB 3-0 CT1 27 (SUTURE) ×2
SUT VIC AB 3-0 CT1 TAPERPNT 27 (SUTURE) ×1 IMPLANT
SYR CONTROL 10ML LL (SYRINGE) ×4 IMPLANT
TIBIAL BASE ROT PLAT SZ 8 KNEE (Knees) ×2 IMPLANT
TRAY FOLEY MTR SLVR 16FR STAT (SET/KITS/TRAYS/PACK) ×2 IMPLANT
WATER STERILE IRR 1000ML POUR (IV SOLUTION) ×4 IMPLANT
WRAP KNEE MAXI GEL POST OP (GAUZE/BANDAGES/DRESSINGS) ×1 IMPLANT
YANKAUER SUCT BULB TIP 10FT TU (MISCELLANEOUS) ×2 IMPLANT

## 2018-08-10 NOTE — Op Note (Signed)
PATIENT ID:      David [name]  MRN:     [id number] DOB/AGE:    [birth year] / 72 y.o.       OPERATIVE REPORT    DATE OF PROCEDURE:  08/10/2018       PREOPERATIVE DIAGNOSIS:   RIGHT KNEE OSTEOARTHRITIS      Estimated body mass index is 24.94 kg/m as calculated from the following:   Height as of this encounter: 5\' 8"  (1.727 m).   Weight as of this encounter: 74.4 kg.                                                        POSTOPERATIVE DIAGNOSIS:   RIGHT KNEE OSTEOARTHRITIS                                                                      PROCEDURE:  Procedure(s): TOTAL KNEE ARTHROPLASTY Using DepuyAttune RP implants #7R Femur, #8Tibia, 5 mm Attune RP bearing, 41 Patella     SURGEON: Kerin Salen    ASSISTANT:   Kerry Hough. [employer]   (Present and scrubbed throughout the case, critical for assistance with exposure, retraction, instrumentation, and closure.)         ANESTHESIA: Spinal, 20cc Exparel, 50cc 0.25% Marcaine  EBL: 300 cc  FLUID REPLACEMENT: 1500 cc crystaloid  Tourniquet Time: None  Drains: None  Tranexamic Acid: 1gm IV, 2gm topical  Exparel: 266mg    COMPLICATIONS:  None         INDICATIONS FOR PROCEDURE: The patient has  RIGHT KNEE OSTEOARTHRITIS, Var deformities, XR shows bone on bone arthritis, lateral subluxation of tibia. Patient has failed all conservative measures including anti-inflammatory medicines, narcotics, attempts at exercise and weight loss, cortisone injections and viscosupplementation.  Risks and benefits of surgery have been discussed, questions answered.   DESCRIPTION OF PROCEDURE: The patient identified by armband, received  IV antibiotics, in the holding area at [facility]. Patient taken to the operating room, appropriate anesthetic monitors were attached, and Spinal anesthesia was  induced. IV Tranexamic acid was given.Tourniquet applied high to the operative thigh. Lateral post and foot positioner applied to the table, the lower extremity was  then prepped and draped in usual sterile fashion from the toes to the tourniquet. Time-out procedure was performed. The skin and subcutaneous tissue along the incision was injected with 20 cc of a mixture of Exparel and Marcaine solution, using a 20-gauge by 1-1/2 inch needle. We began the operation, with the knee flexed 130 degrees, by making the anterior midline incision starting at handbreadth above the patella going over the patella 1 cm medial to and 4 cm distal to the tibial tubercle. Small bleeders in the skin and the subcutaneous tissue identified and cauterized. Transverse retinaculum was incised and reflected medially and a medial parapatellar arthrotomy was accomplished. the patella was everted and theprepatellar fat pad resected. The superficial medial collateral ligament was then elevated from anterior to posterior along the proximal flare of the tibia and anterior half of the menisci resected. The knee was hyperflexed exposing bone on  bone arthritis. Peripheral and notch osteophytes as well as the cruciate ligaments were then resected. We continued to work our way around posteriorly along the proximal tibia, and externally rotated the tibia subluxing it out from underneath the femur. A McHale retractor was placed through the notch and a lateral Hohmann retractor placed, and we then drilled through the proximal tibia in line with the axis of the tibia followed by an intramedullary guide rod and 2-degree posterior slope cutting guide. The tibial cutting guide, 4 degree posterior sloped, was pinned into place allowing resection of 4 mm of bone medially and 12 mm of bone laterally. Satisfied with the tibial resection, we then entered the distal femur 2 mm anterior to the PCL origin with the intramedullary guide rod and applied the distal femoral cutting guide set at 9 mm, with 5 degrees of valgus. This was pinned along the epicondylar axis. At this point, the distal femoral cut was accomplished without  difficulty. We then sized for a #7R femoral component and pinned the guide in 3 degrees of external rotation. The chamfer cutting guide was pinned into place. The anterior, posterior, and chamfer cuts were accomplished without difficulty followed by the Attune RP box cutting guide and the box cut. We also removed posterior osteophytes from the posterior femoral condyles. The posterior capsule was injected with Exparel solution. The knee was brought into full extension. We checked our extension gap and fit a 5 mm bearing. Distracting in extension with a lamina spreader,  bleeders in the posterior capsule, Posterior medial and posterior lateral right down and cauterized.  The transexamic acid-soaked sponge was then placed in the gap of the knee and extension. The knee was flexed 30. The posterior patella cut was accomplished with the 9.5 mm Attune cutting guide, sized for a 32mm dome, and the fixation pegs drilled.The knee was then once again hyperflexed exposing the proximal tibia. We sized for a # 8 tibial base plate, applied the smokestack and the conical reamer followed by the the Delta fin keel punch. We then hammered into place the Attune RP trial femoral component, drilled the lugs, inserted a  5 mm trial bearing, trial patellar button, and took the knee through range of motion from 0-130 degrees. Medial and lateral ligamentous stability was checked. No thumb pressure was required for patellar Tracking. The tourniquet was not used. All trial components were removed, mating surfaces irrigated with pulse lavage, and dried with suction and sponges. 10 cc of the Exparel solution was applied to the cancellus bone of the patella distal femur and proximal tibia.  After waiting 30 seconds, the bony surfaces were again, dried with sponges. A double batch of DePuy HV cement was mixed and applied to all bony metallic mating surfaces except for the posterior condyles of the femur itself. In order, we hammered into place  the tibial tray and removed excess cement, the femoral component and removed excess cement. The final Attune RP bearing was inserted, and the knee brought to full extension with compression. The patellar button was clamped into place, and excess cement removed. The knee was held at 30 flexion with compression, while the cement cured. The wound was irrigated out with normal saline solution pulse lavage. The rest of the Exparel was injected into the parapatellar arthrotomy, subcutaneous tissues, and periosteal tissues. The parapatellar arthrotomy was closed with running #1 Vicryl suture. The subcutaneous tissue with 0 and 2-0 undyed Vicryl suture, and the skin with running 3-0 SQ vicryl. An Aquacil and Ace  wrap were applied. The patient was taken to recovery room without difficulty.   Kerin Salen 08/10/2018, 8:32 AM

## 2018-08-10 NOTE — Care Plan (Signed)
Ortho Bundle Case Management Note  Patient Details  Name: David [name] MRN: [id number] Date of Birth: 08/05/1946  Spoke with patient. Will discharge to home with family and go straight to OPPT per his request. Appointment is set up at [facility] for 08/13/18               DME Arranged:  Gilford Rile rolling DME Agency:  [employer]  HH Arranged:    [place] Agency:     Additional Comments: Please contact me with any questions of if this plan should need to change.  Ladell Heads,  [facility]  [phone] 08/10/2018, 11:25 AM

## 2018-08-10 NOTE — Anesthesia Procedure Notes (Signed)
Spinal  Patient location during procedure: OR End time: 08/10/2018 7:25 AM Staffing Resident/CRNA: Lissa Morales, CRNA Performed: resident/CRNA  Preanesthetic Checklist Completed: patient identified, site marked, surgical consent, pre-op evaluation, timeout performed, IV checked, risks and benefits discussed and monitors and equipment checked Spinal Block Patient position: sitting Prep: Betadine and DuraPrep Patient monitoring: heart rate, continuous pulse ox and blood pressure Approach: midline Location: L3-4 Injection technique: single-shot Needle Needle type: Pencan  Needle gauge: 24 G Needle length: 9 cm Additional Notes Expiration date of kit checked and confirmed. Patient tolerated procedure well, without complications.

## 2018-08-10 NOTE — Interval H&P Note (Signed)
History and Physical Interval Note:  08/10/2018 7:04 AM  David [name]  has presented today for surgery, with the diagnosis of RIGHT KNEE OSTEOARTHRITIS  The various methods of treatment have been discussed with the patient and family. After consideration of risks, benefits and other options for treatment, the patient has consented to  Procedure(s): TOTAL KNEE ARTHROPLASTY (Right) as a surgical intervention .  The patient's history has been reviewed, patient examined, no change in status, stable for surgery.  I have reviewed the patient's chart and labs.  Questions were answered to the patient's satisfaction.     Kerin Salen

## 2018-08-10 NOTE — Evaluation (Signed)
Physical Therapy Evaluation Patient Details Name: David [name] MRN: [id number] DOB: [birth year] Today's Date: 08/10/2018   History of Present Illness  Pt is a 72 year old male s/p R TKA  Clinical Impression  Pt is s/p TKA resulting in the deficits listed below (see PT Problem List).  Pt will benefit from skilled PT to increase their independence and safety with mobility to allow discharge to the venue listed below.  Pt ambulated short distance in hallway POD #0 and plans to d/c home and f/u with OP PT.  Pt reports a couple steps to enter home, bedroom on main level, and then a "man cave" upstairs.      Follow Up Recommendations Follow surgeon's recommendation for DC plan and follow-up therapies(plan for OPPT)    Equipment Recommendations  Rolling walker with 5" wheels    Recommendations for Other Services       Precautions / Restrictions Precautions Precautions: Knee;Fall Restrictions Other Position/Activity Restrictions: WBAT      Mobility  Bed Mobility Overal bed mobility: Needs Assistance Bed Mobility: Supine to Sit     Supine to sit: Min guard;HOB elevated     General bed mobility comments: verbal cues for technique  Transfers Overall transfer level: Needs assistance Equipment used: Rolling walker (2 wheeled) Transfers: Sit to/from Stand Sit to Stand: Min assist         General transfer comment: slight assist to rise and steady, verbal cues for UE and LE positioning  Ambulation/Gait Ambulation/Gait assistance: Min guard Gait Distance (Feet): 80 Feet Assistive device: Rolling walker (2 wheeled) Gait Pattern/deviations: Decreased stride length;Step-to pattern;Decreased stance time - right;Antalgic     General Gait Details: verbal cues for sequence, RW positioning, step length, improved to small step through pattern, encouraged WBing through UEs on RW  Stairs            Wheelchair Mobility    Modified Rankin (Stroke Patients Only)       Balance                                              Pertinent Vitals/Pain Pain Assessment: No/denies pain    Home Living Family/patient expects to be discharged to:: Private residence Living Arrangements: Spouse/significant other   Type of Home: House Home Access: Stairs to enter   Technical brewer of Steps: 2-3 Home Layout: Able to live on main level with bedroom/bathroom Home Equipment: None      Prior Function Level of Independence: Independent               Hand Dominance        Extremity/Trunk Assessment        Lower Extremity Assessment Lower Extremity Assessment: RLE deficits/detail RLE Deficits / Details: able to perform SLR, ankle pumps, observed at least 75* AROM functionally with transfers       Communication   Communication: No difficulties  Cognition Arousal/Alertness: Awake/alert Behavior During Therapy: WFL for tasks assessed/performed Overall Cognitive Status: Within Functional Limits for tasks assessed                                        General Comments      Exercises Total Joint Exercises Ankle Circles/Pumps: AROM;5 reps;Both Quad Sets: AROM;Both;5 reps   Assessment/Plan  PT Assessment Patient needs continued PT services  PT Problem List Decreased strength;Decreased mobility;Decreased activity tolerance;Decreased range of motion;Decreased knowledge of use of DME;Pain;Decreased knowledge of precautions       PT Treatment Interventions Stair training;Therapeutic exercise;Patient/family education;Functional mobility training;Gait training;DME instruction;Therapeutic activities    PT Goals (Current goals can be found in the Care Plan section)  Acute Rehab PT Goals PT Goal Formulation: With patient Time For Goal Achievement: 08/15/18 Potential to Achieve Goals: Good    Frequency 7X/week   Barriers to discharge        Co-evaluation               AM-PAC PT "6 Clicks" Mobility   Outcome Measure Help needed turning from your back to your side while in a flat bed without using bedrails?: None Help needed moving from lying on your back to sitting on the side of a flat bed without using bedrails?: A Little Help needed moving to and from a bed to a chair (including a wheelchair)?: A Little Help needed standing up from a chair using your arms (e.g., wheelchair or bedside chair)?: A Little Help needed to walk in hospital room?: A Little Help needed climbing 3-5 steps with a railing? : A Little 6 Click Score: 19    End of Session Equipment Utilized During Treatment: Gait belt Activity Tolerance: Patient tolerated treatment well Patient left: in chair;with chair alarm set;with call bell/phone within reach   PT Visit Diagnosis: Other abnormalities of gait and mobility (R26.89)    Time: 7340-3709 PT Time Calculation (min) (ACUTE ONLY): 18 min   Charges:   PT Evaluation $PT Eval Low Complexity: [place], PT, DPT Acute Rehabilitation Services Office: [phone] Pager: [phone]  Trena Platt 08/10/2018, 4:20 PM

## 2018-08-10 NOTE — Anesthesia Procedure Notes (Signed)
Procedure Name: MAC Date/Time: 08/10/2018 7:17 AM Performed by: Lissa Morales, CRNA Pre-anesthesia Checklist: Emergency Drugs available, Patient identified, Suction available, Patient being monitored and Timeout performed Patient Re-evaluated:Patient Re-evaluated prior to induction Oxygen Delivery Method: Simple face mask Placement Confirmation: positive ETCO2

## 2018-08-10 NOTE — Discharge Instructions (Signed)

## 2018-08-10 NOTE — Anesthesia Procedure Notes (Signed)
Anesthesia Regional Block: Adductor canal block   Pre-Anesthetic Checklist: ,, timeout performed, Correct Patient, Correct Site, Correct Laterality, Correct Procedure, Correct Position, site marked, Risks and benefits discussed,  Surgical consent,  Pre-op evaluation,  At surgeon's request and post-op pain management  Laterality: Right  Prep: Maximum Sterile Barrier Precautions used, chloraprep       Needles:  Injection technique: Single-shot  Needle Type: Echogenic Stimulator Needle     Needle Length: 9cm  Needle Gauge: 22     Additional Needles:   Procedures:,,,, ultrasound used (permanent image in chart),,,,  Narrative:  Start time: 08/10/2018 7:01 AM End time: 08/10/2018 7:11 AM Injection made incrementally with aspirations every 5 mL.  Performed by: Personally  Anesthesiologist: Freddrick March, MD  Additional Notes: Monitors applied. No increased pain on injection. No increased resistance to injection. Injection made in 5cc increments. Good needle visualization. Patient tolerated procedure well.

## 2018-08-10 NOTE — Transfer of Care (Signed)
Immediate Anesthesia Transfer of Care Note  Patient: David [name]  Procedure(s) Performed: TOTAL KNEE ARTHROPLASTY (Right Knee)  Patient Location: PACU  Anesthesia Type:Spinal  Level of Consciousness: awake, alert , oriented and patient cooperative  Airway & Oxygen Therapy: Patient Spontanous Breathing and Patient connected to face mask oxygen  Post-op Assessment: Report given to RN and Post -op Vital signs reviewed and stable  Post vital signs: stable  Last Vitals:  Vitals Value Taken Time  BP    Temp    Pulse    Resp    SpO2      Last Pain:  Vitals:   08/10/18 1049  TempSrc:   PainSc: 3       Patients Stated Pain Goal: 2 (58/85/02 7741)  Complications: No apparent anesthesia complications

## 2018-08-10 NOTE — Anesthesia Postprocedure Evaluation (Signed)
Anesthesia Post Note  Patient: David [name]  Procedure(s) Performed: TOTAL KNEE ARTHROPLASTY (Right Knee)     Patient location during evaluation: PACU Anesthesia Type: Regional and Spinal Level of consciousness: oriented and awake and alert Pain management: pain level controlled Vital Signs Assessment: post-procedure vital signs reviewed and stable Respiratory status: spontaneous breathing, respiratory function stable and patient connected to nasal cannula oxygen Cardiovascular status: blood pressure returned to baseline and stable Postop Assessment: no headache, no backache and no apparent nausea or vomiting Anesthetic complications: no    Last Vitals:  Vitals:   08/10/18 1032 08/10/18 1131  BP: 104/66 130/82  Pulse: 60 91  Resp: 14 16  Temp: (!) 36.3 C (!) 36.4 C  SpO2: 97% 98%    Last Pain:  Vitals:   08/10/18 1147  TempSrc:   PainSc: 2                  Brenner Visconti L Kasai Beltran

## 2018-08-11 ENCOUNTER — Encounter (HOSPITAL_COMMUNITY): Payer: Self-pay | Admitting: Orthopedic Surgery

## 2018-08-11 DIAGNOSIS — D62 Acute posthemorrhagic anemia: Secondary | ICD-10-CM | POA: Diagnosis not present

## 2018-08-11 DIAGNOSIS — I442 Atrioventricular block, complete: Secondary | ICD-10-CM | POA: Diagnosis not present

## 2018-08-11 DIAGNOSIS — Z79899 Other long term (current) drug therapy: Secondary | ICD-10-CM | POA: Diagnosis not present

## 2018-08-11 DIAGNOSIS — M1711 Unilateral primary osteoarthritis, right knee: Secondary | ICD-10-CM | POA: Diagnosis not present

## 2018-08-11 DIAGNOSIS — Z7982 Long term (current) use of aspirin: Secondary | ICD-10-CM | POA: Diagnosis not present

## 2018-08-11 LAB — CBC
HCT: 40.5 % (ref 39.0–52.0)
Hemoglobin: 13.1 g/dL (ref 13.0–17.0)
MCH: 31 pg (ref 26.0–34.0)
MCHC: 32.3 g/dL (ref 30.0–36.0)
MCV: 96 fL (ref 80.0–100.0)
Platelets: 180 10*3/uL (ref 150–400)
RBC: 4.22 MIL/uL (ref 4.22–5.81)
RDW: 13.9 % (ref 11.5–15.5)
WBC: 14.2 10*3/uL — ABNORMAL HIGH (ref 4.0–10.5)
nRBC: 0 % (ref 0.0–0.2)

## 2018-08-11 LAB — BASIC METABOLIC PANEL
Anion gap: 8 (ref 5–15)
BUN: 22 mg/dL (ref 8–23)
CO2: 25 mmol/L (ref 22–32)
Calcium: 8.8 mg/dL — ABNORMAL LOW (ref 8.9–10.3)
Chloride: 105 mmol/L (ref 98–111)
Creatinine, Ser: 0.9 mg/dL (ref 0.61–1.24)
GFR calc Af Amer: >60 mL/min (ref >60)
GFR calc non Af Amer: >60 mL/min (ref >60)
Glucose, Bld: 148 mg/dL — ABNORMAL HIGH (ref 70–99)
Potassium: 5.1 mmol/L (ref 3.5–5.1)
Sodium: 138 mmol/L (ref 135–145)

## 2018-08-11 NOTE — Plan of Care (Signed)
Patient to discharge home. Discharge instructions went over with patient and his wife, both verbalized understanding. IV dc'd. Rx given.

## 2018-08-11 NOTE — Progress Notes (Signed)
   08/11/18 1059  PT Visit Information--excellent progress with HEP/knee ROM; ready fo rd/c from PT standpoint, all concerns addressed regarding mobility; discussed safety and progression, pt states he does not have OPPT appt--advised to call Renee;   Last PT Received On 08/11/18  Assistance Needed +1  History of Present Illness Pt is a 72 year old male s/p R TKA  Subjective Data  Patient Stated Goal play golf in the spring  Precautions  Precautions Knee;Fall  Precaution Comments reviewed no pillow under knee and use of bone foam  Restrictions  Other Position/Activity Restrictions WBAT  Pain Assessment  Pain Assessment 0-10  Pain Score 1  Pain Location right knee  Pain Descriptors / Indicators Discomfort  Pain Intervention(s) Monitored during session;Ice applied  Cognition  Arousal/Alertness Awake/alert  Behavior During Therapy WFL for tasks assessed/performed  Overall Cognitive Status Within Functional Limits for tasks assessed  Total Joint Exercises  Ankle Circles/Pumps AROM;Both;10 reps  Quad Sets AROM;Both;10 reps  Short Arc Quad AROM;Right;10 reps  Heel Slides AROM;Right;10 reps  Hip ABduction/ADduction AROM;Right;10 reps  Straight Leg Raises AROM;Right;10 reps  Goniometric ROM grossly 5* to 95* AAROM right knee flexion  PT - End of Session  Activity Tolerance Patient tolerated treatment well  Patient left in bed;with call bell/phone within reach;with bed alarm set  Nurse Communication Other (comment) (ready for d/c)   PT - Assessment/Plan  PT Plan Current plan remains appropriate  PT Visit Diagnosis Other abnormalities of gait and mobility (R26.89)  PT Frequency (ACUTE ONLY) 7X/week  Follow Up Recommendations Follow surgeon's recommendation for DC plan and follow-up therapies  PT equipment Rolling walker with 5" wheels  AM-PAC PT "6 Clicks" Mobility Outcome Measure (Version 2)  Help needed turning from your back to your side while in a flat bed without using bedrails?  4  Help needed moving from lying on your back to sitting on the side of a flat bed without using bedrails? 4  Help needed moving to and from a bed to a chair (including a wheelchair)? 4  Help needed standing up from a chair using your arms (e.g., wheelchair or bedside chair)? 4  Help needed to walk in hospital room? 4  Help needed climbing 3-5 steps with a railing?  3  6 Click Score 23  Consider Recommendation of Discharge To: Home with no services  PT Goal Progression  Progress towards PT goals Progressing toward goals  Acute Rehab PT Goals  PT Goal Formulation With patient  Time For Goal Achievement 08/15/18  Potential to Achieve Goals Good  PT Time Calculation  PT Start Time (ACUTE ONLY) 1035  PT Stop Time (ACUTE ONLY) 1049  PT Time Calculation (min) (ACUTE ONLY) 14 min  PT General Charges  $$ ACUTE PT VISIT 1 Visit  PT Treatments  $Therapeutic Exercise 8-22 mins

## 2018-08-11 NOTE — Progress Notes (Signed)
PATIENT ID: David [name]  MRN: [id number]  DOB/AGE:  11/28/1945 / 72 y.o.  1 Day Post-Op Procedure(s) (LRB): TOTAL KNEE ARTHROPLASTY (Right)    PROGRESS NOTE Subjective: Patient is alert, oriented, no Nausea, no Vomiting, yes passing gas, . Taking PO well. Denies SOB, Chest or Calf Pain. Using Incentive Spirometer, PAS in place. Ambulate 52' Patient reports pain as  1/10  .    Objective: Vital signs in last 24 hours: Vitals:   08/10/18 2122 08/11/18 0128 08/11/18 0136 08/11/18 0533  BP: 137/78 112/64  111/73  Pulse: (Abnormal) 38 (Abnormal) 35  (Abnormal) 35  Resp: 12 12  14   Temp:   97.6 F (36.4 C) 97.9 F (36.6 C)  TempSrc:   Oral Oral  SpO2: 98% 99%  100%  Weight:      Height:          Intake/Output from previous day: I/O last 3 completed shifts: In: 4814.6 [P.O.:1560; I.V.:3224; IV Piggyback:30.6] Out: 2250 [Urine:1950; Blood:300]   Intake/Output this shift: No intake/output data recorded.   LABORATORY DATA: Recent Labs    08/11/18 0421  WBC 14.2*  HGB 13.1  HCT 40.5  PLT 180  NA 138  K 5.1  CL 105  CO2 25  BUN 22  CREATININE 0.90  GLUCOSE 148*  CALCIUM 8.8*    Examination: Neurologically intact ABD soft Neurovascular intact Sensation intact distally Intact pulses distally Dorsiflexion/Plantar flexion intact Incision: dressing C/D/I No cellulitis present Compartment soft} XR AP&Lat of hip shows well placed\fixed THA  Assessment:   1 Day Post-Op Procedure(s) (LRB): TOTAL KNEE ARTHROPLASTY (Right) ADDITIONAL DIAGNOSIS:  Expected Acute Blood Loss Anemia, asthma  Plan: PT/OT WBAT, THA  DVT Prophylaxis: SCDx72 hrs, ASA 81 mg BID x 2 weeks  DISCHARGE PLAN: Home, today  DISCHARGE NEEDS: Walker and 3-in-1 comode seatPatient ID: David [name], male   DOB: 12-22-1945, 72 y.o.   MRN: [id number]

## 2018-08-11 NOTE — Progress Notes (Signed)
PT TX NOTE  08/11/18 1000  PT Visit Information--Pt progressing very well; will see again for HEP after brief rest; reviewed not overdoing it at home;   Last PT Received On 08/11/18  Assistance Needed +1  History of Present Illness Pt is a 72 year old male s/p R TKA  Subjective Data  Patient Stated Goal play golf in the spring  Precautions  Precautions Knee;Fall  Restrictions  Weight Bearing Restrictions No  Other Position/Activity Restrictions WBAT  Pain Assessment  Pain Assessment 0-10  Pain Score 1  Pain Location right knee  Pain Descriptors / Indicators Discomfort  Pain Intervention(s) Monitored during session  Cognition  Arousal/Alertness Awake/alert  Behavior During Therapy WFL for tasks assessed/performed  Overall Cognitive Status Within Functional Limits for tasks assessed  Bed Mobility  Overal bed mobility Needs Assistance  Bed Mobility Supine to Sit;Sit to Supine  Supine to sit Modified independent (Device/Increase time)  Sit to supine Supervision;Modified independent (Device/Increase time)  Transfers  Overall transfer level Needs assistance  Equipment used Rolling walker (2 wheeled)  Transfers Sit to/from Stand  Sit to Stand Supervision  General transfer comment cues for hand placement  Ambulation/Gait  Ambulation/Gait assistance Supervision  Gait Distance (Feet) 300 Feet  Assistive device Rolling walker (2 wheeled)  Gait Pattern/deviations Step-through pattern  General Gait Details verbal cues for safety, WBing, use fo RW for balance  Stairs Yes  Stairs assistance Min guard  Stair Management One rail Right;Step to pattern;Sideways;Backwards;With walker  Number of Stairs 6  General stair comments cues for technique, backward with RW and with one handrail  Balance  Overall balance assessment No apparent balance deficits (not formally assessed)  PT - End of Session  Equipment Utilized During Treatment Gait belt  Activity Tolerance Patient tolerated treatment  well  Patient left in chair;with chair alarm set;with call bell/phone within reach   PT - Assessment/Plan  PT Plan Current plan remains appropriate  PT Visit Diagnosis Other abnormalities of gait and mobility (R26.89)  PT Frequency (ACUTE ONLY) 7X/week  Follow Up Recommendations Follow surgeon's recommendation for DC plan and follow-up therapies (OPPT)  PT equipment Rolling walker with 5" wheels  AM-PAC PT "6 Clicks" Mobility Outcome Measure (Version 2)  Help needed turning from your back to your side while in a flat bed without using bedrails? 4  Help needed moving from lying on your back to sitting on the side of a flat bed without using bedrails? 4  Help needed moving to and from a bed to a chair (including a wheelchair)? 4  Help needed standing up from a chair using your arms (e.g., wheelchair or bedside chair)? 4  Help needed to walk in hospital room? 3  Help needed climbing 3-5 steps with a railing?  3  6 Click Score 22  Consider Recommendation of Discharge To: Home with no services  PT Goal Progression  Progress towards PT goals Progressing toward goals  Acute Rehab PT Goals  PT Goal Formulation With patient  Time For Goal Achievement 08/15/18  Potential to Achieve Goals Good  PT Time Calculation  PT Start Time (ACUTE ONLY) 1014  PT Stop Time (ACUTE ONLY) 1028  PT Time Calculation (min) (ACUTE ONLY) 14 min  PT General Charges  $$ ACUTE PT VISIT 1 Visit  PT Treatments  $Gait Training 8-22 mins

## 2018-08-11 NOTE — Discharge Summary (Signed)
Patient ID: David [name] MRN: [id number] DOB/AGE: [birth year] 72 y.o.  Admit date: 08/10/2018 Discharge date: 08/11/2018  Admission Diagnoses:  Principal Problem:   Osteoarthritis of right knee Active Problems:   Arthritis of right knee   Discharge Diagnoses:  Same  Past Medical History:  Diagnosis Date  . Asthma    exercise-induced; prn inhaler  . AV block, 2nd degree   . Bilateral inguinal hernia (BIH) 10/16/2012   Overview:  [facility] -Dr. Brantley Stage.  Observation for now 10/2012  . Complete heart block ([place]) 04/04/2018  . Insomnia 07/14/2014  . Mobitz type 1 second degree atrioventricular block   . OA (osteoarthritis) 09/24/2012  . 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 Reactive airway disease 09/24/2012  . Rotator cuff tear, right    impingement, AC arthrosis    Surgeries: Procedure(s): TOTAL KNEE ARTHROPLASTY on 08/10/2018   Consultants:   Discharged Condition: Improved  Hospital Course: David [name] is an 72 y.o. male who was admitted 08/10/2018 for operative treatment ofOsteoarthritis of right knee. Patient has severe unremitting pain that affects sleep, daily activities, and work/hobbies. After pre-op clearance the patient was taken to the operating room on 08/10/2018 and underwent  Procedure(s): TOTAL KNEE ARTHROPLASTY.    Patient was given perioperative antibiotics:  Anti-infectives (From admission, onward)   Start     Dose/Rate Route Frequency Ordered Stop   08/10/18 0600  ceFAZolin (ANCEF) IVPB 2g/100 mL premix     2 g 200 mL/hr over 30 Minutes Intravenous On call to O.R. 08/10/18 0541 08/10/18 0734       Patient was given sequential compression devices, early ambulation, and chemoprophylaxis to prevent DVT.  Patient benefited maximally from hospital stay and there were no complications.    Recent vital signs:  Patient Vitals for the past 24 hrs:  BP Temp Temp src Pulse Resp SpO2  08/11/18 0533 111/73 97.9 F (36.6 C) Oral (Abnormal) 35 14  100 %  08/11/18 0136 no documentation 97.6 F (36.4 C) Oral no documentation no documentation no documentation  08/11/18 0128 112/64 no documentation no documentation (Abnormal) 35 12 99 %  08/10/18 2122 137/78 no documentation no documentation (Abnormal) 38 12 98 %  08/10/18 2121 no documentation 97.7 F (36.5 C) Oral no documentation no documentation no documentation  08/10/18 1737 (Abnormal) 149/75 98.6 F (37 C) no documentation 75 14 97 %  08/10/18 1335 113/75 (Abnormal) 97.4 F (36.3 C) no documentation (Abnormal) 44 14 97 %  08/10/18 1250 119/75 (Abnormal) 97.4 F (36.3 C) no documentation (Abnormal) 55 14 98 %  08/10/18 1131 130/82 (Abnormal) 97.5 F (36.4 C) Oral 91 16 98 %  08/10/18 1032 104/66 (Abnormal) 97.4 F (36.3 C) Oral 60 14 97 %  08/10/18 1015 104/67 (Abnormal) 97.4 F (36.3 C) no documentation (Abnormal) 42 13 97 %  08/10/18 1000 103/60 no documentation no documentation (Abnormal) 42 16 98 %  08/10/18 0945 107/70 no documentation no documentation (Abnormal) 42 14 96 %  08/10/18 0930 90/63 (Abnormal) 97.4 F (36.3 C) no documentation (Abnormal) 50 13 99 %  08/10/18 0915 96/60 no documentation no documentation (Abnormal) 51 12 99 %  08/10/18 0909 95/65 (Abnormal) 97.5 F (36.4 C) no documentation (Abnormal) 52 18 95 %     Recent laboratory studies:  Recent Labs    08/11/18 0421  WBC 14.2*  HGB 13.1  HCT 40.5  PLT 180  NA 138  K 5.1  CL 105  CO2 25  BUN 22  CREATININE 0.90  GLUCOSE  148*  CALCIUM 8.8*     Discharge Medications:   Allergies as of 08/11/2018    Allergen Reactions Comment   Codeine Itching       Medication List    Stop taking these medications   aspirin 81 MG tablet Replaced by:  aspirin EC 81 MG tablet     Take these medications   aspirin EC 81 MG tablet Take 1 tablet (81 mg total) by mouth 2 (two) times daily. Replaces:  aspirin 81 MG tablet   budesonide-formoterol 160-4.5 MCG/ACT inhaler Commonly known as:   SYMBICORT Inhale 1 puff into the lungs daily.   fluticasone 50 MCG/ACT nasal spray Commonly known as:  FLONASE Place 1 spray into both nostrils daily as needed for allergies.   HYDROmorphone 2 MG tablet Commonly known as:  DILAUDID Take 1 tablet (2 mg total) by mouth every 6 (six) hours as needed for severe pain.   multivitamin per tablet Take 1 tablet by mouth daily.   tiZANidine 2 MG tablet Commonly known as:  ZANAFLEX Take 1 tablet (2 mg total) by mouth every 6 (six) hours as needed.        Durable Medical Equipment  (From admission, onward)         Start     Ordered   08/10/18 1034  DME Walker rolling  Once    Question:  Patient needs a walker to treat with the following condition  Answer:  Status post right knee replacement   08/10/18 1033   08/10/18 1034  DME 3 n 1  Once     08/10/18 1033           Discharge Care Instructions  (From admission, onward)         Start     Ordered   08/11/18 0000  Change dressing    Comments:  Change dressing Only if drainage exceeds 40% of window on dressing   08/11/18 0745          Diagnostic Studies: No results found.  Disposition: Discharge disposition: 01-Home or Self Care       Discharge Instructions    Call MD / Call 911   Complete by:  As directed    If you experience chest pain or shortness of breath, CALL 911 and be transported to the hospital emergency room.  If you develope a fever above 101 F, pus (white drainage) or increased drainage or redness at the wound, or calf pain, call your surgeon's office.   Change dressing   Complete by:  As directed    Change dressing Only if drainage exceeds 40% of window on dressing   Constipation Prevention   Complete by:  As directed    Drink plenty of fluids.  Prune juice may be helpful.  You may use a stool softener, such as Colace (over the counter) 100 mg twice a day.  Use MiraLax (over the counter) for constipation as needed.   Diet - low sodium heart healthy    Complete by:  As directed    Increase activity slowly as tolerated   Complete by:  As directed       Follow-up Information    Frederik Pear, MD. Go on 08/20/2018.   Specialty:  Orthopedic Surgery Why:  Your appointment has been made for 1000 Contact information: [place] [address] [phone]        [facility] Go on 08/13/2018.   Why:  You are scheduled to start Outpatient  physical therapy on 08/13/18 @ 1200. Please arrive at 11:40 to complete your paperwork.  Contact information: Physical Therapy [place] [address] [phone]            Signed: Kerin Salen 08/11/2018, 7:46 AM    `

## 2018-08-13 ENCOUNTER — Ambulatory Visit (HOSPITAL_COMMUNITY)
Admission: RE | Admit: 2018-08-13 | Discharge: 2018-08-13 | Disposition: A | Discharge status: Home / Self Care | Payer: Medicare Other | Source: Ambulatory Visit | Attending: Orthopedic Surgery | Admitting: Orthopedic Surgery

## 2018-08-13 ENCOUNTER — Other Ambulatory Visit (HOSPITAL_COMMUNITY): Payer: Self-pay | Admitting: Orthopedic Surgery

## 2018-08-13 DIAGNOSIS — M6281 Muscle weakness (generalized): Secondary | ICD-10-CM | POA: Diagnosis not present

## 2018-08-13 DIAGNOSIS — M79604 Pain in right leg: Secondary | ICD-10-CM | POA: Diagnosis not present

## 2018-08-13 DIAGNOSIS — Z96651 Presence of right artificial knee joint: Secondary | ICD-10-CM | POA: Diagnosis not present

## 2018-08-13 DIAGNOSIS — M7989 Other specified soft tissue disorders: Secondary | ICD-10-CM

## 2018-08-13 DIAGNOSIS — M25661 Stiffness of right knee, not elsewhere classified: Secondary | ICD-10-CM | POA: Diagnosis not present

## 2018-08-13 DIAGNOSIS — I82409 Acute embolism and thrombosis of unspecified deep veins of unspecified lower extremity: Secondary | ICD-10-CM

## 2018-08-13 HISTORY — DX: Acute embolism and thrombosis of unspecified deep veins of unspecified lower extremity: I82.409

## 2018-08-13 NOTE — Progress Notes (Signed)
Right lower extremity venous duplex exam completed. Positive for acute deep vein thrombosis involving the right peroneal vein. There is a 0.95x0.74x0.60 cm heterogenous fluid collection with peripheral hyperemia noticed at popliteal fossa.  Result called ordering physician and discussed with Dr. Mayer Camel. He is aware of this. Please see preliminary notes on CV PROC under chart review. Zakhai Meisinger H Ardene Remley(RDMS RVT) 08/13/18 2:56 PM

## 2018-08-14 ENCOUNTER — Ambulatory Visit: Payer: Medicare Other | Admitting: Cardiology

## 2018-08-14 NOTE — Progress Notes (Deleted)
Electrophysiology Office Note   Date:  08/14/2018   ID:  David [name], DOB Dec 28, 1945, MRN [id number]  PCP:  David Salinas, MD  Cardiologist:  David [name] Primary Electrophysiologist:  David Vickroy Meredith Leeds, MD    No chief complaint on file.    History of Present Illness: David [name] is a 72 y.o. male who is being seen today for the evaluation of bradycardia at the request of David [name], *. Presenting today for electrophysiology evaluation.  He initially presented to the hospital with weight gain and lower extremity edema.  Echo showed a normal ejection fraction and left heart catheterization showed no evidence of coronary disease.  He was found to have Mobitz 1 and 2-1 AV block while in the hospital.  He wore a Holter monitor that showed similar results.  Currently he feels well without major complaint.  He is able to do all of his daily activities.  He is able to exercise without restraint.  Today, denies symptoms of palpitations, chest pain, shortness of breath, orthopnea, PND, lower extremity edema, claudication, dizziness, presyncope, syncope, bleeding, or neurologic sequela. The patient is tolerating medications without difficulties. ***    Past Medical History:  Diagnosis Date  . Asthma    exercise-induced; prn inhaler  . AV block, 2nd degree   . Bilateral inguinal hernia (BIH) 10/16/2012   Overview:  [facility] -Dr. Brantley Stage.  Observation for now 10/2012  . Complete heart block ([place]) 04/04/2018  . Insomnia 07/14/2014  . Mobitz type 1 second degree atrioventricular block   . OA (osteoarthritis) 09/24/2012  . 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 Reactive airway disease 09/24/2012  . Rotator cuff tear, right    impingement, AC arthrosis   Past Surgical History:  Procedure Laterality Date  . LEFT HEART CATH AND CORONARY ANGIOGRAPHY N/A 04/06/2018   Procedure: LEFT HEART CATH AND CORONARY ANGIOGRAPHY;  Surgeon: Jettie Booze, MD;  Location: [place] [facility];  Service: Cardiovascular;  Laterality: N/A;  . LUMBAR LAMINECTOMY  age 24s  . REFRACTIVE SURGERY    . SHOULDER ARTHROSCOPY  08/06/2011   Procedure: ARTHROSCOPY SHOULDER;  Surgeon: Cammie Sickle., MD;  Location: [place];  Service: Orthopedics;  Laterality: Right;  with subacromial decompression, arthroscopic subscapularis repair, and rotator cuff repair  . TOTAL KNEE ARTHROPLASTY Right 08/10/2018   Procedure: TOTAL KNEE ARTHROPLASTY;  Surgeon: Frederik Pear, MD;  [facility];  Service: Orthopedics;  Laterality: Right;  . TRIGGER FINGER RELEASE Right 06/30/2014   Procedure: RELEASE A-1 PULLEY RIGHT THUMB;  Surgeon: Daryll Brod, MD;  Location: [place];  Service: Orthopedics;  Laterality: Right;  ANESTHESIA:  IV REGIONAL FAB     Current Outpatient Medications  Medication Sig Dispense Refill  . aspirin EC 81 MG tablet Take 1 tablet (81 mg total) by mouth 2 (two) times daily. 60 tablet 0  . budesonide-formoterol (SYMBICORT) 160-4.5 MCG/ACT inhaler Inhale 1 puff into the lungs daily.     . fluticasone (FLONASE) 50 MCG/ACT nasal spray Place 1 spray into both nostrils daily as needed for allergies.     Marland Kitchen HYDROmorphone (DILAUDID) 2 MG tablet Take 1 tablet (2 mg total) by mouth every 6 (six) hours as needed for severe pain. 30 tablet 0  . multivitamin (THERAGRAN) per tablet Take 1 tablet by mouth daily.      Marland Kitchen tiZANidine (ZANAFLEX) 2 MG tablet Take 1 tablet (2 mg total) by mouth every 6 (six) hours as needed. [place]  tablet 0   No current facility-administered medications for this visit.     Allergies:   Codeine   Social History:  The patient  reports that he has never smoked. He has never used smokeless tobacco. He reports current alcohol use. He reports that he does not use drugs.   Family History:  The patient's family history includes Alzheimer's disease in his father; Heart Problems in his mother; Prostate cancer in his father.   ROS:   Please see the history of present illness.   Otherwise, review of systems is positive for ***.   All other systems are reviewed and negative.   PHYSICAL EXAM: VS:  There were no vitals taken for this visit. , BMI There is no height or weight on file to calculate BMI. GEN: Well nourished, well developed, in no acute distress  HEENT: normal  Neck: no JVD, carotid bruits, or masses Cardiac: ***RRR; no murmurs, rubs, or gallops,no edema  Respiratory:  clear to auscultation bilaterally, normal work of breathing GI: soft, nontender, nondistended, + BS MS: no deformity or atrophy  Skin: warm and dry Neuro:  Strength and sensation are intact Psych: euthymic mood, full affect  EKG:  EKG {ACTION; IS/IS XKG:[id number]} ordered today. Personal review of the ekg ordered *** shows ***  Recent Labs: 04/04/2018: ALT 53; B Natriuretic Peptide 301.3 04/05/2018: Magnesium 2.0; TSH 2.137 08/11/2018: BUN 22; Creatinine, Ser 0.90; Hemoglobin 13.1; Platelets 180; Potassium 5.1; Sodium 138    Lipid Panel     Component Value Date/Time   CHOL 141 04/05/2018 0019   TRIG 27 04/05/2018 0019   HDL 37 (L) 04/05/2018 0019   CHOLHDL 3.8 04/05/2018 0019   VLDL 5 04/05/2018 0019   LDLCALC 99 04/05/2018 0019     Wt Readings from Last 3 Encounters:  08/10/18 164 lb (74.4 kg)  08/06/18 164 lb (74.4 kg)  05/05/18 167 lb (75.8 kg)      Other studies Reviewed: Additional studies/ records that were reviewed today include: LHC 04/06/18  Review of the above records today demonstrates:   The left ventricular systolic function is normal.  LV end diastolic pressure is normal.  The left ventricular ejection fraction is 55-65% by visual estimate.  There is no aortic valve stenosis.  No significant CAD.  TTE 04/05/18 - Left ventricle: The cavity size was normal. Wall thickness was   normal. Systolic function was normal. The estimated ejection   fraction was in the range of 55% to 60%. Wall motion was normal;    there were no regional wall motion abnormalities. - Aortic valve: Trileaflet; mildly thickened, mildly calcified   leaflets. - Right ventricle: The cavity size was mildly dilated. Wall   thickness was normal. - Tricuspid valve: There was trivial regurgitation.  Holter 04/20/18 - personally reviewed Minimum HR: 28 BPM at 7:36:27 AM(2) Maximum HR: 111 BPM at 9:36:54 AM(2) Average HR: 53 BPM 1% PVCs Sinus rhythm with mobitz I block, 2:1 AV block, and complete heart block during waking hours with narrow complex QRS  ASSESSMENT AND PLAN:  1.  2-1 AV block: ***Interspersed with Mobitz 1 AV block.  He is currently asymptomatic.  At this point he would like to avoid pacemaker implant.  His QRS is narrow and thus I feel like this is a reasonable thing to do.  He Samay Delcarlo monitor his symptoms over the next few months and if he does have fatigue and shortness of breath while exercising, Zakeya Junker potentially implant a pacemaker at that time.  Current medicines are reviewed at length with the patient today.   The patient does not have concerns regarding his medicines.  The following changes were made today:  ***  Labs/ tests ordered today include:  No orders of the defined types were placed in this encounter.    Disposition:   FU with Sharrieff Spratlin *** months  Signed, Venicia Vandall Meredith Leeds, MD  08/14/2018 8:09 AM     [facility] [address]  [place] [address] [phone] (office) [phone] (fax)

## 2018-08-16 DIAGNOSIS — M1711 Unilateral primary osteoarthritis, right knee: Secondary | ICD-10-CM | POA: Diagnosis not present

## 2018-08-20 DIAGNOSIS — M1711 Unilateral primary osteoarthritis, right knee: Secondary | ICD-10-CM | POA: Diagnosis not present

## 2018-08-20 DIAGNOSIS — Z471 Aftercare following joint replacement surgery: Secondary | ICD-10-CM | POA: Diagnosis not present

## 2018-08-20 DIAGNOSIS — Z96651 Presence of right artificial knee joint: Secondary | ICD-10-CM | POA: Diagnosis not present

## 2018-08-21 DIAGNOSIS — M25661 Stiffness of right knee, not elsewhere classified: Secondary | ICD-10-CM | POA: Diagnosis not present

## 2018-08-21 DIAGNOSIS — M6281 Muscle weakness (generalized): Secondary | ICD-10-CM | POA: Diagnosis not present

## 2018-08-21 DIAGNOSIS — Z96651 Presence of right artificial knee joint: Secondary | ICD-10-CM | POA: Diagnosis not present

## 2018-08-24 DIAGNOSIS — M25661 Stiffness of right knee, not elsewhere classified: Secondary | ICD-10-CM | POA: Diagnosis not present

## 2018-08-24 DIAGNOSIS — M6281 Muscle weakness (generalized): Secondary | ICD-10-CM | POA: Diagnosis not present

## 2018-08-24 DIAGNOSIS — Z96651 Presence of right artificial knee joint: Secondary | ICD-10-CM | POA: Diagnosis not present

## 2018-08-28 DIAGNOSIS — M6281 Muscle weakness (generalized): Secondary | ICD-10-CM | POA: Diagnosis not present

## 2018-08-28 DIAGNOSIS — M25661 Stiffness of right knee, not elsewhere classified: Secondary | ICD-10-CM | POA: Diagnosis not present

## 2018-08-28 DIAGNOSIS — Z96651 Presence of right artificial knee joint: Secondary | ICD-10-CM | POA: Diagnosis not present

## 2018-09-10 ENCOUNTER — Encounter: Payer: Self-pay | Admitting: *Deleted

## 2018-09-22 ENCOUNTER — Encounter: Payer: Self-pay | Admitting: Cardiology

## 2018-09-22 ENCOUNTER — Ambulatory Visit (INDEPENDENT_AMBULATORY_CARE_PROVIDER_SITE_OTHER): Payer: Medicare Other | Admitting: Cardiology

## 2018-09-22 VITALS — BP 124/70 | HR 53 | Ht 68.0 in | Wt 163.0 lb

## 2018-09-22 DIAGNOSIS — I441 Atrioventricular block, second degree: Secondary | ICD-10-CM | POA: Diagnosis not present

## 2018-09-22 NOTE — Patient Instructions (Addendum)
Medication Instructions:  Your physician recommends that you continue on your current medications as directed. Please refer to the Current Medication list given to you today.  If you need a refill on your cardiac medications before your next appointment, please call your pharmacy.   Lab work: None ordered  Testing/Procedures: None ordered  Follow-Up: Your physician recommends that you schedule a follow-up appointment in: 1 month with Dr. Curt Bears   Thank you for choosing [facility]!!   Trinidad Curet, RN [phone]  Any Other Special Instructions Will Be Listed Below (If Applicable).  Cardiac Ablation Cardiac ablation is a procedure to disable (ablate) a small amount of heart tissue in very specific places. The heart has many electrical connections. Sometimes these connections are abnormal and can cause the heart to beat very fast or irregularly. Ablating some of the problem areas can improve the heart rhythm or return it to normal. Ablation may be done for people who:  Have Wolff-Parkinson-White syndrome.  Have fast heart rhythms (tachycardia).  Have taken medicines for an abnormal heart rhythm (arrhythmia) that were not effective or caused side effects.  Have a high-risk heartbeat that may be life-threatening. During the procedure, a small incision is made in the neck or the groin, and a long, thin, flexible tube (catheter) is inserted into the incision and moved to the heart. Small devices (electrodes) on the tip of the catheter will send out electrical currents. A type of X-ray (fluoroscopy) will be used to help guide the catheter and to provide images of the heart. Tell a health care provider about:  Any allergies you have.  All medicines you are taking, including vitamins, herbs, eye drops, creams, and over-the-counter medicines.  Any problems you or family members have had with anesthetic medicines.  Any blood disorders you have.  Any surgeries you have  had.  Any medical conditions you have, such as kidney failure.  Whether you are pregnant or may be pregnant. What are the risks? Generally, this is a safe procedure. However, problems may occur, including:  Infection.  Bruising and bleeding at the catheter insertion site.  Bleeding into the chest, especially into the sac that surrounds the heart. This is a serious complication.  Stroke or blood clots.  Damage to other structures or organs.  Allergic reaction to medicines or dyes.  Need for a permanent pacemaker if the normal electrical system is damaged. A pacemaker is a small computer that sends electrical signals to the heart and helps your heart beat normally.  The procedure not being fully effective. This may not be recognized until months later. Repeat ablation procedures are sometimes required. What happens before the procedure?  Follow instructions from your health care provider about eating or drinking restrictions.  Ask your health care provider about: ? Changing or stopping your regular medicines. This is especially important if you are taking diabetes medicines or blood thinners. ? Taking medicines such as aspirin and ibuprofen. These medicines can thin your blood. Do not take these medicines before your procedure if your health care provider instructs you not to.  Plan to have someone take you home from the hospital or clinic.  If you will be going home right after the procedure, plan to have someone with you for 24 hours. What happens during the procedure?  To lower your risk of infection: ? Your health care team will wash or sanitize their hands. ? Your skin will be washed with soap. ? Hair may be removed from the incision area.  An IV tube will be inserted into one of your veins.  You will be given a medicine to help you relax (sedative).  The skin on your neck or groin will be numbed.  An incision will be made in your neck or your groin.  A needle will  be inserted through the incision and into a large vein in your neck or groin.  A catheter will be inserted into the needle and moved to your heart.  Dye may be injected through the catheter to help your surgeon see the area of the heart that needs treatment.  Electrical currents will be sent from the catheter to ablate heart tissue in desired areas. There are three types of energy that may be used to ablate heart tissue: ? Heat (radiofrequency energy). ? Laser energy. ? Extreme cold (cryoablation).  When the necessary tissue has been ablated, the catheter will be removed.  Pressure will be held on the catheter insertion area to prevent excessive bleeding.  A bandage (dressing) will be placed over the catheter insertion area. The procedure may vary among health care providers and hospitals. What happens after the procedure?  Your blood pressure, heart rate, breathing rate, and blood oxygen level will be monitored until the medicines you were given have worn off.  Your catheter insertion area will be monitored for bleeding. You will need to lie still for a few hours to ensure that you do not bleed from the catheter insertion area.  Do not drive for 24 hours or as long as directed by your health care provider. Summary  Cardiac ablation is a procedure to disable (ablate) a small amount of heart tissue in very specific places. Ablating some of the problem areas can improve the heart rhythm or return it to normal.  During the procedure, electrical currents will be sent from the catheter to ablate heart tissue in desired areas. This information is not intended to replace advice given to you by your health care provider. Make sure you discuss any questions you have with your health care provider. Document Released: 01/05/2009 Document Revised: 07/08/2016 Document Reviewed: 07/08/2016 Elsevier Interactive Patient Education  2019 [employer].

## 2018-09-22 NOTE — Progress Notes (Signed)
Electrophysiology Office Note   Date:  09/22/2018   ID:  David [name], DOB [birth year], MRN [id number]  PCP:  Wayland Salinas, MD  Cardiologist:  Marlou Porch Primary Electrophysiologist:  Otilia Kareem Meredith Leeds, MD    No chief complaint on file.    History of Present Illness: David [name] is a 73 y.o. male who is being seen today for the evaluation of bradycardia at the request of Ryter-Brown, Shyrl Numbers, *. Presenting today for electrophysiology evaluation.  He initially presented to the hospital with weight gain and lower extremity edema.  Echo showed a normal ejection fraction and left heart catheterization showed no evidence of coronary disease.  He was found to have Mobitz 1 and 2-1 AV block while in the hospital.  He wore a Holter monitor that showed similar results.  Currently he feels well without major complaint.  He is able to do all of his daily activities.  He is able to exercise without restraint.  Today, denies symptoms of palpitations, chest pain, shortness of breath, orthopnea, PND, lower extremity edema, claudication, dizziness, presyncope, syncope, bleeding, or neurologic sequela. The patient is tolerating medications without difficulties.  Overall he is feeling well.  He has no chest pain or shortness of breath.   Past Medical History:  Diagnosis Date  . Asthma    exercise-induced; prn inhaler  . AV block, 2nd degree   . Bilateral inguinal hernia (BIH) 10/16/2012   Overview:  [facility] -Dr. Brantley Stage.  Observation for now 10/2012  . Complete heart block ([place]) 04/04/2018  . Insomnia 07/14/2014  . Mobitz type 1 second degree atrioventricular block   . OA (osteoarthritis) 09/24/2012  . 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 Reactive airway disease 09/24/2012  . Rotator cuff tear, right    impingement, AC arthrosis   Past Surgical History:  Procedure Laterality Date  . LEFT HEART CATH AND CORONARY ANGIOGRAPHY N/A 04/06/2018   Procedure: LEFT HEART CATH AND  CORONARY ANGIOGRAPHY;  Surgeon: Jettie Booze, MD;  Location: [place] [facility];  Service: Cardiovascular;  Laterality: N/A;  . LUMBAR LAMINECTOMY  age 66s  . REFRACTIVE SURGERY    . SHOULDER ARTHROSCOPY  08/06/2011   Procedure: ARTHROSCOPY SHOULDER;  Surgeon: Cammie Sickle., MD;  Location: [place];  Service: Orthopedics;  Laterality: Right;  with subacromial decompression, arthroscopic subscapularis repair, and rotator cuff repair  . TOTAL KNEE ARTHROPLASTY Right 08/10/2018   Procedure: TOTAL KNEE ARTHROPLASTY;  Surgeon: Frederik Pear, MD;  [facility];  Service: Orthopedics;  Laterality: Right;  . TRIGGER FINGER RELEASE Right 06/30/2014   Procedure: RELEASE A-1 PULLEY RIGHT THUMB;  Surgeon: Daryll Brod, MD;  Location: [place];  Service: Orthopedics;  Laterality: Right;  ANESTHESIA:  IV REGIONAL FAB     Current Outpatient Medications  Medication Sig Dispense Refill  . aspirin EC 81 MG tablet Take 1 tablet (81 mg total) by mouth 2 (two) times daily. 60 tablet 0  . budesonide-formoterol (SYMBICORT) 160-4.5 MCG/ACT inhaler Inhale 1 puff into the lungs daily.     Marland Kitchen ELIQUIS 5 MG TABS tablet Take 5 mg by mouth 2 (two) times daily.    . fluticasone (FLONASE) 50 MCG/ACT nasal spray Place 1 spray into both nostrils daily as needed for allergies.     Marland Kitchen HYDROmorphone (DILAUDID) 2 MG tablet Take 1 tablet (2 mg total) by mouth every 6 (six) hours as needed for severe pain. 30 tablet 0  . multivitamin (THERAGRAN) per tablet  Take 1 tablet by mouth daily.      Marland Kitchen tiZANidine (ZANAFLEX) 2 MG tablet Take 1 tablet (2 mg total) by mouth every 6 (six) hours as needed. 60 tablet 0  . traMADol (ULTRAM) 50 MG tablet Take 50 mg by mouth every 6 (six) hours as needed.     No current facility-administered medications for this visit.     Allergies:   Codeine   Social History:  The patient  reports that he has never smoked. He has never used smokeless tobacco. He  reports current alcohol use. He reports that he does not use drugs.   Family History:  The patient's family history includes Alzheimer's disease in his father; Heart Problems in his mother; Prostate cancer in his father.    ROS:  Please see the history of present illness.   Otherwise, review of systems is positive for none.   All other systems are reviewed and negative.   PHYSICAL EXAM: VS:  BP 124/70   Pulse (!) 53   Ht 5\' 8"  (1.727 m)   Wt 163 lb (73.9 kg)   BMI 24.78 kg/m  , BMI Body mass index is 24.78 kg/m. GEN: Well nourished, well developed, in no acute distress  HEENT: normal  Neck: no JVD, carotid bruits, or masses Cardiac: RRR; no murmurs, rubs, or gallops,no edema  Respiratory:  clear to auscultation bilaterally, normal work of breathing GI: soft, nontender, nondistended, + BS MS: no deformity or atrophy  Skin: warm and dry Neuro:  Strength and sensation are intact Psych: euthymic mood, full affect  EKG:  EKG is ordered today. Personal review of the ekg ordered  shows atrial flutter, rate 53  Recent Labs: 04/04/2018: ALT 53; B Natriuretic Peptide 301.3 04/05/2018: Magnesium 2.0; TSH 2.137 08/11/2018: BUN 22; Creatinine, Ser 0.90; Hemoglobin 13.1; Platelets 180; Potassium 5.1; Sodium 138    Lipid Panel     Component Value Date/Time   CHOL 141 04/05/2018 0019   TRIG 27 04/05/2018 0019   HDL 37 (L) 04/05/2018 0019   CHOLHDL 3.8 04/05/2018 0019   VLDL 5 04/05/2018 0019   LDLCALC 99 04/05/2018 0019     Wt Readings from Last 3 Encounters:  09/22/18 163 lb (73.9 kg)  08/10/18 164 lb (74.4 kg)  08/06/18 164 lb (74.4 kg)      Other studies Reviewed: Additional studies/ records that were reviewed today include: LHC 04/06/18  Review of the above records today demonstrates:   The left ventricular systolic function is normal.  LV end diastolic pressure is normal.  The left ventricular ejection fraction is 55-65% by visual estimate.  There is no aortic valve  stenosis.  No significant CAD.  TTE 04/05/18 - Left ventricle: The cavity size was normal. Wall thickness was   normal. Systolic function was normal. The estimated ejection   fraction was in the range of 55% to 60%. Wall motion was normal;   there were no regional wall motion abnormalities. - Aortic valve: Trileaflet; mildly thickened, mildly calcified   leaflets. - Right ventricle: The cavity size was mildly dilated. Wall   thickness was normal. - Tricuspid valve: There was trivial regurgitation.  Holter 04/20/18 - personally reviewed Minimum HR: 28 BPM at 7:36:27 AM(2) Maximum HR: 111 BPM at 9:36:54 AM(2) Average HR: 53 BPM 1% PVCs Sinus rhythm with mobitz I block, 2:1 AV block, and complete heart block during waking hours with narrow complex QRS  ASSESSMENT AND PLAN:  1.  2-1 second-degree AV block: Previously had block  interspersed with Mobitz 1 AV block.  He is minimally symptomatic and does not wish to have a pacemaker implanted.  We Jachai Okazaki continue to monitor for symptoms of fatigue, weakness, and shortness of breath.    2.  Typical appearing atrial flutter: Found incidentally on EKG today.  He is currently on Eliquis as he recently had knee surgery and had a DVT as a complication.  He would like to have an ablation performed, but at this point he is still undergoing physical therapy.  He is minimally symptomatic.  I Yasmin Bronaugh bring him back in 1 month to further discuss ablation.  This patients CHA2DS2-VASc Score and unadjusted Ischemic Stroke Rate (% per year) is equal to 0.6 % stroke rate/year from a score of 1  Above score calculated as 1 point each if present [CHF, HTN, DM, Vascular=MI/PAD/Aortic Plaque, Age if 65-74, or Male] Above score calculated as 2 points each if present [Age > 75, or Stroke/TIA/TE]   Current medicines are reviewed at length with the patient today.   The patient does not have concerns regarding his medicines.  The following changes were made today:  None  Labs/ tests ordered today include:  Orders Placed This Encounter  Procedures  . EKG 12-Lead     Disposition:   FU with Jovin Fester 1 months  Signed, Janice Seales Meredith Leeds, MD  09/22/2018 9:09 AM     [facility] [address] [place] [address] [phone] (office) [phone] (fax)

## 2018-10-14 ENCOUNTER — Telehealth: Payer: Self-pay | Admitting: Cardiology

## 2018-10-14 NOTE — Telephone Encounter (Signed)
° ° °  Patient calling to schedule ablation. Advised patient MyChart message was addressed.

## 2018-10-19 NOTE — Telephone Encounter (Signed)
Pt agreeable to 3/18 or 3/19.  He understands I will check to see availability for the 18th and let him know via MyChart.  Pt already scheduled for OV on 3/6 and understands we will review procedure instructions at that visit.

## 2018-10-26 NOTE — Telephone Encounter (Signed)
New Message   Patient calling to schedule his ablation with Trinidad Curet.

## 2018-10-26 NOTE — Telephone Encounter (Signed)
lmtcb

## 2018-10-30 ENCOUNTER — Encounter: Payer: Self-pay | Admitting: *Deleted

## 2018-10-30 NOTE — Telephone Encounter (Signed)
Ablation scheduled for 3/18 Pt scheduled for H&P and pre labs on 3/6 w/ Camnitz. Will go over procedure instructions at that [place].

## 2018-11-06 ENCOUNTER — Ambulatory Visit (INDEPENDENT_AMBULATORY_CARE_PROVIDER_SITE_OTHER): Payer: Medicare Other | Admitting: Cardiology

## 2018-11-06 ENCOUNTER — Encounter: Payer: Self-pay | Admitting: Cardiology

## 2018-11-06 VITALS — BP 142/84 | HR 43 | Ht 68.0 in | Wt 166.0 lb

## 2018-11-06 DIAGNOSIS — I483 Typical atrial flutter: Secondary | ICD-10-CM

## 2018-11-06 DIAGNOSIS — Z01812 Encounter for preprocedural laboratory examination: Secondary | ICD-10-CM | POA: Diagnosis not present

## 2018-11-06 NOTE — Patient Instructions (Addendum)
Medication Instructions:  Your physician recommends that you continue on your current medications as directed. Please refer to the Current Medication list given to you today.  * If you need a refill on your cardiac medications before your next appointment, please call your pharmacy.   Labwork: Today: BMET & CBC *We will only notify you of abnormal results, otherwise continue current treatment plan.  Testing/Procedures: None ordered  Follow-Up: Your physician recommends that you schedule a follow-up appointment in: 4 weeks, after your ablation on 3/18, with Dr. Curt Bears  *Please note that any paperwork needing to be filled out by the provider will need to be addressed at the front desk prior to seeing the provider. Please note that any FMLA, disability or other documents regarding health condition is subject to a $25.00 charge that must be received prior to completion of paperwork in the form of a money order or check.  Thank you for choosing [facility]!!   Trinidad Curet, RN [phone]

## 2018-11-06 NOTE — Progress Notes (Signed)
Electrophysiology Office Note   Date:  11/06/2018   ID:  David [name], DOB 04-21-1946, MRN [id number]  PCP:  Wayland Salinas, MD  Cardiologist:  Marlou Porch Primary Electrophysiologist:  Alieu Finnigan Meredith Leeds, MD    No chief complaint on file.    History of Present Illness: David [name] is a 73 y.o. male who is being seen today for the evaluation of bradycardia at the request of Ryter-Brown, Shyrl Numbers, *. Presenting today for electrophysiology evaluation.  He initially presented to the hospital with weight gain and lower extremity edema.  Echo showed a normal ejection fraction and left heart catheterization showed no evidence of coronary disease.  He was found to have Mobitz 1 and 2-1 AV block while in the hospital.  He wore a Holter monitor that showed similar results.  Currently he feels well without major complaint.  He is able to do all of his daily activities.  He is able to exercise without restraint.  Today, denies symptoms of palpitations, chest pain, shortness of breath, orthopnea, PND, lower extremity edema, claudication, dizziness, presyncope, syncope, bleeding, or neurologic sequela. The patient is tolerating medications without difficulties.  He has been feeling well.  He has been Jordan his knee which has gone without issue.  He is ready for ablation of atrial flutter.   Past Medical History:  Diagnosis Date  . Asthma    exercise-induced; prn inhaler  . AV block, 2nd degree   . Bilateral inguinal hernia (BIH) 10/16/2012   Overview:  [facility] -Dr. Brantley Stage.  Observation for now 10/2012  . Complete heart block ([place]) 04/04/2018  . Insomnia 07/14/2014  . Mobitz type 1 second degree atrioventricular block   . OA (osteoarthritis) 09/24/2012  . 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 Reactive airway disease 09/24/2012  . Rotator cuff tear, right    impingement, AC arthrosis   Past Surgical History:  Procedure Laterality Date  . LEFT HEART CATH AND CORONARY  ANGIOGRAPHY N/A 04/06/2018   Procedure: LEFT HEART CATH AND CORONARY ANGIOGRAPHY;  Surgeon: Jettie Booze, MD;  Location: [facility];  Service: Cardiovascular;  Laterality: N/A;  . LUMBAR LAMINECTOMY  age 50s  . REFRACTIVE SURGERY    . SHOULDER ARTHROSCOPY  08/06/2011   Procedure: ARTHROSCOPY SHOULDER;  Surgeon: Cammie Sickle., MD;  Location: [facility];  Service: Orthopedics;  Laterality: Right;  with subacromial decompression, arthroscopic subscapularis repair, and rotator cuff repair  . TOTAL KNEE ARTHROPLASTY Right 08/10/2018   Procedure: TOTAL KNEE ARTHROPLASTY;  Surgeon: Frederik Pear, MD;  Location: [facility];  Service: Orthopedics;  Laterality: Right;  . TRIGGER FINGER RELEASE Right 06/30/2014   Procedure: RELEASE A-1 PULLEY RIGHT THUMB;  Surgeon: Daryll Brod, MD;  Location: [facility];  Service: Orthopedics;  Laterality: Right;  ANESTHESIA:  IV REGIONAL FAB     Current Outpatient Medications  Medication Sig Dispense Refill  . aspirin EC 81 MG tablet Take 1 tablet (81 mg total) by mouth 2 (two) times daily. 60 tablet 0  . budesonide-formoterol (SYMBICORT) 160-4.5 MCG/ACT inhaler Inhale 1 puff into the lungs daily.     Marland Kitchen ELIQUIS 5 MG TABS tablet Take 5 mg by mouth 2 (two) times daily.    . fluticasone (FLONASE) 50 MCG/ACT nasal spray Place 1 spray into both nostrils daily as needed for allergies.     Marland Kitchen HYDROmorphone (DILAUDID) 2 MG tablet Take 1 tablet (2 mg total) by mouth every 6 (six) hours as needed for  severe pain. 30 tablet 0  . multivitamin (THERAGRAN) per tablet Take 1 tablet by mouth daily.      Marland Kitchen tiZANidine (ZANAFLEX) 2 MG tablet Take 1 tablet (2 mg total) by mouth every 6 (six) hours as needed. 60 tablet 0  . traMADol (ULTRAM) 50 MG tablet Take 50 mg by mouth every 6 (six) hours as needed.     No current facility-administered medications for this visit.     Allergies:   Codeine   Social History:  The patient  reports that he has  never smoked. He has never used smokeless tobacco. He reports current alcohol use. He reports that he does not use drugs.   Family History:  The patient's family history includes Alzheimer's disease in his father; Heart Problems in his mother; Prostate cancer in his father.    ROS:  Please see the history of present illness.   Otherwise, review of systems is positive for none.   All other systems are reviewed and negative.   PHYSICAL EXAM: VS:  BP (!) 142/84   Pulse (!) 43   Ht 5\' 8"  (1.727 m)   Wt 166 lb (75.3 kg)   SpO2 96%   BMI 25.24 kg/m  , BMI Body mass index is 25.24 kg/m. GEN: Well nourished, well developed, in no acute distress  HEENT: normal  Neck: no JVD, carotid bruits, or masses Cardiac: RRR; no murmurs, rubs, or gallops,no edema  Respiratory:  clear to auscultation bilaterally, normal work of breathing GI: soft, nontender, nondistended, + BS MS: no deformity or atrophy  Skin: warm and dry Neuro:  Strength and sensation are intact Psych: euthymic mood, full affect  EKG:  EKG is not ordered today. Personal review of the ekg ordered 09/22/17 shows atrial flutter, rate 53   Recent Labs: 04/04/2018: ALT 53; B Natriuretic Peptide 301.3 04/05/2018: Magnesium 2.0; TSH 2.137 08/11/2018: BUN 22; Creatinine, Ser 0.90; Hemoglobin 13.1; Platelets 180; Potassium 5.1; Sodium 138    Lipid Panel     Component Value Date/Time   CHOL 141 04/05/2018 0019   TRIG 27 04/05/2018 0019   HDL 37 (L) 04/05/2018 0019   CHOLHDL 3.8 04/05/2018 0019   VLDL 5 04/05/2018 0019   LDLCALC 99 04/05/2018 0019     Wt Readings from Last 3 Encounters:  11/06/18 166 lb (75.3 kg)  09/22/18 163 lb (73.9 kg)  08/10/18 164 lb (74.4 kg)      Other studies Reviewed: Additional studies/ records that were reviewed today include: LHC 04/06/18  Review of the above records today demonstrates:   The left ventricular systolic function is normal.  LV end diastolic pressure is normal.  The left  ventricular ejection fraction is 55-65% by visual estimate.  There is no aortic valve stenosis.  No significant CAD.  TTE 04/05/18 - Left ventricle: The cavity size was normal. Wall thickness was   normal. Systolic function was normal. The estimated ejection   fraction was in the range of 55% to 60%. Wall motion was normal;   there were no regional wall motion abnormalities. - Aortic valve: Trileaflet; mildly thickened, mildly calcified   leaflets. - Right ventricle: The cavity size was mildly dilated. Wall   thickness was normal. - Tricuspid valve: There was trivial regurgitation.  Holter 04/20/18 - personally reviewed Minimum HR: 28 BPM at 7:36:27 AM(2) Maximum HR: 111 BPM at 9:36:54 AM(2) Average HR: 53 BPM 1% PVCs Sinus rhythm with mobitz I block, 2:1 AV block, and complete heart block during waking hours  with narrow complex QRS  ASSESSMENT AND PLAN:  1.  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  He is minimally symptomatic and does not wish to have the pacemaker implantation.  No changes at this time.    2.  Typical appearing atrial flutter: Currently on Eliquis.  His atrial flutter was found incidentally.  He has minimal symptoms.  We Kathaleya Mcduffee plan for ablation.  Risks and benefits were discussed include bleeding, tamponade, heart block, stroke.  He understands these risks and is agreed to the procedure.  This patients CHA2DS2-VASc Score and unadjusted Ischemic Stroke Rate (% per year) is equal to 0.6 % stroke rate/year from a score of 1  Above score calculated as 1 point each if present [CHF, HTN, DM, Vascular=MI/PAD/Aortic Plaque, Age if 65-74, or Male] Above score calculated as 2 points each if present [Age > 75, or Stroke/TIA/TE]   Current medicines are reviewed at length with the patient today.   The patient does not have concerns regarding his medicines.  The following changes were made today: None  Labs/ tests  ordered today include:  No orders of the defined types were placed in this encounter.    Disposition:   FU with Rashon Rezek 1.5 months  Signed, Ronell Boldin Meredith Leeds, MD  11/06/2018 1:44 PM     [address] [place] [address] [phone] (office) [phone] (fax)

## 2018-11-07 LAB — CBC
Hematocrit: 45.9 % (ref 37.5–51.0)
Hemoglobin: 14.6 g/dL (ref 13.0–17.7)
MCH: 30.2 pg (ref 26.6–33.0)
MCHC: 31.8 g/dL (ref 31.5–35.7)
MCV: 95 fL (ref 79–97)
Platelets: 201 10*3/uL (ref 150–450)
RBC: 4.84 x10E6/uL (ref 4.14–5.80)
RDW: 13 % (ref 11.6–15.4)
WBC: 6.4 10*3/uL (ref 3.4–10.8)

## 2018-11-07 LAB — BASIC METABOLIC PANEL
BUN/Creatinine Ratio: 19 (ref 10–24)
BUN: 20 mg/dL (ref 8–27)
CO2: 23 mmol/L (ref 20–29)
Calcium: 9.3 mg/dL (ref 8.6–10.2)
Chloride: 99 mmol/L (ref 96–106)
Creatinine, Ser: 1.07 mg/dL (ref 0.76–1.27)
GFR calc Af Amer: 80 mL/min/{1.73_m2} (ref >59)
GFR calc non Af Amer: 69 mL/min/{1.73_m2} (ref >59)
Glucose: 87 mg/dL (ref 65–99)
Potassium: 4.9 mmol/L (ref 3.5–5.2)
Sodium: 138 mmol/L (ref 134–144)

## 2018-11-16 ENCOUNTER — Telehealth: Payer: Self-pay | Admitting: *Deleted

## 2018-11-16 NOTE — Telephone Encounter (Signed)
Informed pt that we are cancelling all non-emergent procedures secondary to COVID-19.   Advised to call office if he begins to experience any issues r/t AFLutter.  He understands office will call him back to reschedule at a later date. Pt agreeable to plan.

## 2018-11-18 ENCOUNTER — Ambulatory Visit (HOSPITAL_COMMUNITY): Admission: RE | Admit: 2018-11-18 | Payer: Medicare Other | Source: Home / Self Care | Admitting: Cardiology

## 2018-11-18 ENCOUNTER — Encounter (HOSPITAL_COMMUNITY): Admission: RE | Payer: Self-pay | Source: Home / Self Care

## 2018-11-18 SURGERY — A-FLUTTER ABLATION
Anesthesia: General

## 2018-12-06 ENCOUNTER — Emergency Department (HOSPITAL_COMMUNITY): Payer: Medicare Other

## 2018-12-06 ENCOUNTER — Encounter (HOSPITAL_COMMUNITY): Payer: Self-pay | Admitting: Emergency Medicine

## 2018-12-06 ENCOUNTER — Emergency Department (HOSPITAL_COMMUNITY)
Admission: EM | Admit: 2018-12-06 | Discharge: 2018-12-06 | Disposition: A | Discharge status: Home / Self Care | Payer: Medicare Other | Attending: Emergency Medicine | Admitting: Emergency Medicine

## 2018-12-06 ENCOUNTER — Other Ambulatory Visit: Payer: Self-pay

## 2018-12-06 DIAGNOSIS — K529 Noninfective gastroenteritis and colitis, unspecified: Secondary | ICD-10-CM

## 2018-12-06 DIAGNOSIS — J45909 Unspecified asthma, uncomplicated: Secondary | ICD-10-CM | POA: Diagnosis not present

## 2018-12-06 DIAGNOSIS — Z79899 Other long term (current) drug therapy: Secondary | ICD-10-CM | POA: Diagnosis not present

## 2018-12-06 DIAGNOSIS — R1030 Lower abdominal pain, unspecified: Secondary | ICD-10-CM | POA: Insufficient documentation

## 2018-12-06 LAB — CBC WITH DIFFERENTIAL/PLATELET
Abs Immature Granulocytes: 0.06 10*3/uL (ref 0.00–0.07)
Basophils Absolute: 0 10*3/uL (ref 0.0–0.1)
Basophils Relative: 0 %
Eosinophils Absolute: 0 10*3/uL (ref 0.0–0.5)
Eosinophils Relative: 0 %
HCT: 47.1 % (ref 39.0–52.0)
Hemoglobin: 15.3 g/dL (ref 13.0–17.0)
Immature Granulocytes: 0 %
Lymphocytes Relative: 7 %
Lymphs Abs: 0.9 10*3/uL (ref 0.7–4.0)
MCH: 29.3 pg (ref 26.0–34.0)
MCHC: 32.5 g/dL (ref 30.0–36.0)
MCV: 90.1 fL (ref 80.0–100.0)
Monocytes Absolute: 0.9 10*3/uL (ref 0.1–1.0)
Monocytes Relative: 6 %
Neutro Abs: 12.4 10*3/uL — ABNORMAL HIGH (ref 1.7–7.7)
Neutrophils Relative %: 87 %
Platelets: 200 10*3/uL (ref 150–400)
RBC: 5.23 MIL/uL (ref 4.22–5.81)
RDW: 14.6 % (ref 11.5–15.5)
WBC: 14.4 10*3/uL — ABNORMAL HIGH (ref 4.0–10.5)
nRBC: 0 % (ref 0.0–0.2)

## 2018-12-06 LAB — URINALYSIS, ROUTINE W REFLEX MICROSCOPIC
Bacteria, UA: NONE SEEN
Bilirubin Urine: NEGATIVE
Glucose, UA: NEGATIVE mg/dL
Ketones, ur: NEGATIVE mg/dL
Leukocytes,Ua: NEGATIVE
Nitrite: NEGATIVE
Protein, ur: 30 mg/dL — AB
Specific Gravity, Urine: 1.024 (ref 1.005–1.030)
pH: 5 (ref 5.0–8.0)

## 2018-12-06 LAB — COMPREHENSIVE METABOLIC PANEL
ALT: 21 U/L (ref 0–44)
AST: 23 U/L (ref 15–41)
Albumin: 3.8 g/dL (ref 3.5–5.0)
Alkaline Phosphatase: 52 U/L (ref 38–126)
Anion gap: 12 (ref 5–15)
BUN: 23 mg/dL (ref 8–23)
CO2: 23 mmol/L (ref 22–32)
Calcium: 9 mg/dL (ref 8.9–10.3)
Chloride: 101 mmol/L (ref 98–111)
Creatinine, Ser: 1.03 mg/dL (ref 0.61–1.24)
GFR calc Af Amer: >60 mL/min (ref >60)
GFR calc non Af Amer: >60 mL/min (ref >60)
Glucose, Bld: 117 mg/dL — ABNORMAL HIGH (ref 70–99)
Potassium: 4.2 mmol/L (ref 3.5–5.1)
Sodium: 136 mmol/L (ref 135–145)
Total Bilirubin: 1 mg/dL (ref 0.3–1.2)
Total Protein: 6.8 g/dL (ref 6.5–8.1)

## 2018-12-06 LAB — LIPASE, BLOOD: Lipase: 25 U/L (ref 11–51)

## 2018-12-06 MED ORDER — ONDANSETRON HCL 4 MG/2ML IJ SOLN
4.0000 mg | Freq: Once | INTRAMUSCULAR | Status: AC
  Administered 2018-12-06: 4 mg via INTRAVENOUS
  Filled 2018-12-06: qty 2

## 2018-12-06 MED ORDER — METRONIDAZOLE 500 MG PO TABS: 500.0000 mg | ORAL_TABLET | Freq: Two times a day (BID) | ORAL | 0 refills | Status: DC

## 2018-12-06 MED ORDER — MORPHINE SULFATE (PF) 4 MG/ML IV SOLN
4.0000 mg | Freq: Once | INTRAVENOUS | Status: AC
  Administered 2018-12-06: 4 mg via INTRAVENOUS
  Filled 2018-12-06: qty 1

## 2018-12-06 MED ORDER — CIPROFLOXACIN HCL 500 MG PO TABS: 500.0000 mg | ORAL_TABLET | Freq: Two times a day (BID) | ORAL | 0 refills | Status: DC

## 2018-12-06 MED ORDER — IOHEXOL 300 MG/ML  SOLN
100.0000 mL | Freq: Once | INTRAMUSCULAR | Status: AC | PRN
  Administered 2018-12-06: 100 mL via INTRAVENOUS

## 2018-12-06 MED ORDER — CIPROFLOXACIN HCL 500 MG PO TABS
500.0000 mg | ORAL_TABLET | Freq: Once | ORAL | Status: AC
  Administered 2018-12-06: 500 mg via ORAL
  Filled 2018-12-06: qty 1

## 2018-12-06 MED ORDER — METRONIDAZOLE 500 MG PO TABS
500.0000 mg | ORAL_TABLET | Freq: Once | ORAL | Status: AC
  Administered 2018-12-06: 500 mg via ORAL
  Filled 2018-12-06: qty 1

## 2018-12-06 NOTE — ED Triage Notes (Addendum)
Patient reports generalized abdominal pain for 2 days with constipation unrelieved by Metamucil , denies emesis or fever , last BM 3 days ago . Denies recent travel history or sick contact with family/friends.

## 2018-12-06 NOTE — ED Provider Notes (Signed)
[facility] EMERGENCY DEPARTMENT Provider Note   CSN: [id number] Arrival date & time: 12/06/18  8315    History   Chief Complaint Chief Complaint  Patient presents with  . Abdominal Pain    HPI David [name] is a 73 y.o. male.     HPI  This is a 73 year old with a history of atrial flutter, asthma, osteoarthritis who presents with abdominal pain.  Patient reports 2-day history of lower abdominal pain.  It is diffusely over the lower abdomen and nonradiating.  It is achy in nature.  He reports that it is 5 out of 10.  It worsens with movement and ambulation.  He reports that earlier this week he had watery diarrhea.  No bloody stools.  He took Metamucil at the advisement of his GI doctor.  He has not had a bowel movement in 3 days.  He denies any nausea vomiting.  Denies fevers.  He has not taken anything for his pain.  He denies any urinary symptoms, chest pain, shortness of breath, fevers.  Past Medical History:  Diagnosis Date  . Asthma    exercise-induced; prn inhaler  . AV block, 2nd degree   . Bilateral inguinal hernia (BIH) 10/16/2012   Overview:  [facility] -Dr. Brantley Stage.  Observation for now 10/2012  . Complete heart block ([place]) 04/04/2018  . Insomnia 07/14/2014  . Mobitz type 1 second degree atrioventricular block   . OA (osteoarthritis) 09/24/2012  . 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 Reactive airway disease 09/24/2012  . Rotator cuff tear, right    impingement, AC arthrosis    Patient Active Problem List   Diagnosis Date Noted  . Arthritis of right knee 08/10/2018  . 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 Mobitz type 1 second degree atrioventricular block   . Edema extremities   . Elevated troponin   . Complete heart block ([place]) 04/04/2018  . Cough variant asthma 05/29/2016  . Insomnia 07/14/2014  . Bilateral inguinal hernia (BIH) 10/16/2012  . OA (osteoarthritis) 09/24/2012  . Reactive airway disease 09/24/2012     Past Surgical History:  Procedure Laterality Date  . LEFT HEART CATH AND CORONARY ANGIOGRAPHY N/A 04/06/2018   Procedure: LEFT HEART CATH AND CORONARY ANGIOGRAPHY;  Surgeon: Jettie Booze, MD;  Location: [facility];  Service: Cardiovascular;  Laterality: N/A;  . LUMBAR LAMINECTOMY  age 40s  . REFRACTIVE SURGERY    . SHOULDER ARTHROSCOPY  08/06/2011   Procedure: ARTHROSCOPY SHOULDER;  Surgeon: Cammie Sickle., MD;  Location: [facility];  Service: Orthopedics;  Laterality: Right;  with subacromial decompression, arthroscopic subscapularis repair, and rotator cuff repair  . TOTAL KNEE ARTHROPLASTY Right 08/10/2018   Procedure: TOTAL KNEE ARTHROPLASTY;  Surgeon: Frederik Pear, MD;  [facility];  Service: Orthopedics;  Laterality: Right;  . TRIGGER FINGER RELEASE Right 06/30/2014   Procedure: RELEASE A-1 PULLEY RIGHT THUMB;  Surgeon: Daryll Brod, MD;  Location: [facility];  Service: Orthopedics;  Laterality: Right;  ANESTHESIA:  IV REGIONAL FAB        Home Medications    Prior to Admission medications   Medication Sig Start Date End Date Taking? Authorizing Provider  budesonide-formoterol (SYMBICORT) 160-4.5 MCG/ACT inhaler Inhale 1 puff into the lungs daily as needed (ASTHMA).  07/12/16   [provider]  ELIQUIS 5 MG TABS tablet Take 5 mg by mouth 2 (two) times daily. 09/04/18   [provider]  multivitamin [facility]) per tablet  Take 1 tablet by mouth daily.      [provider]    Family History Family History  Problem Relation Age of Onset  . Heart Problems Mother        had pacemaker, lived to 37  . Alzheimer's disease Father   . Prostate cancer Father     Social History Social History   Tobacco Use  . Smoking status: Never Smoker  . Smokeless tobacco: Never Used  Substance Use Topics  . Alcohol use: Yes    Comment: occ. beer  . Drug use: No     Allergies   Codeine   Review of Systems  Review of Systems  Constitutional: Negative for fever.  Respiratory: Negative for shortness of breath.   Cardiovascular: Negative for chest pain.  Gastrointestinal: Positive for abdominal pain and diarrhea. Negative for nausea and vomiting.  Genitourinary: Negative for dysuria.  Musculoskeletal: Negative for back pain.  Neurological: Negative for headaches.  All other systems reviewed and are negative.    Physical Exam Updated Vital Signs BP 129/85   Pulse 65   Temp 98.7 F (37.1 C) (Oral)   Resp (!) 21   Ht 1.727 m (5\' 8" )   Wt 72.6 kg   SpO2 92%   BMI 24.33 kg/m   Physical Exam Vitals signs and nursing note reviewed.  Constitutional:      Appearance: He is well-developed. He is not ill-appearing.  HENT:     Head: Normocephalic and atraumatic.  Neck:     Musculoskeletal: Neck supple.  Cardiovascular:     Heart sounds: Normal heart sounds. No murmur.     Comments: Regularly irregular rhythm Pulmonary:     Effort: Pulmonary effort is normal. No respiratory distress.     Breath sounds: Normal breath sounds. No wheezing.  Abdominal:     General: Bowel sounds are normal.     Palpations: Abdomen is soft.     Tenderness: There is abdominal tenderness in the suprapubic area and left lower quadrant. There is no guarding or rebound.  Lymphadenopathy:     Cervical: No cervical adenopathy.  Skin:    General: Skin is warm and dry.  Neurological:     Mental Status: He is alert and oriented to person, place, and time.      ED Treatments / Results  Labs (all labs ordered are listed, but only abnormal results are displayed) Labs Reviewed  CBC WITH DIFFERENTIAL/PLATELET - Abnormal; Notable for the following components:      Result Value   WBC 14.4 (*)    Neutro Abs 12.4 (*)    All other components within normal limits  COMPREHENSIVE METABOLIC PANEL - Abnormal; Notable for the following components:   Glucose, Bld 117 (*)    All other components within normal limits   URINALYSIS, ROUTINE W REFLEX MICROSCOPIC - Abnormal; Notable for the following components:   Hgb urine dipstick SMALL (*)    Protein, ur 30 (*)    All other components within normal limits  LIPASE, BLOOD    EKG EKG Interpretation  Date/Time:  Sunday December 06 2018 05:23:33 EDT Ventricular Rate:  71 PR Interval:    QRS Duration: 92 QT Interval:  452 QTC Calculation: 492 R Axis:   86 Text Interpretation:  Atrial flutter with predominant 4:1 AV block RSR' in V1 or V2, probably normal variant Now in atrial flutter Confirmed by Thayer Jew [phone]) on 12/06/2018 5:52:00 AM   Radiology No results found.  Procedures Procedures (including critical  care time)  Medications Ordered in ED Medications  morphine 4 MG/ML injection 4 mg (4 mg Intravenous Given 12/06/18 0554)  ondansetron (ZOFRAN) injection 4 mg (4 mg Intravenous Given 12/06/18 0554)     Initial Impression / Assessment and Plan / ED Course  I have reviewed the triage vital signs and the nursing notes.  Pertinent labs & imaging results that were available during my care of the patient were reviewed by me and considered in my medical decision making (see chart for details).        Patient presents with abdominal pain.  Previously had some diarrhea.  He is overall nontoxic and vital signs are reassuring.  He is tender on exam mostly in the suprapubic and left lower quadrant.  Suspect possible diverticulitis.  Would be atypical for appendicitis.  No urinary symptoms to suggest urinary tract infection.  Lab work obtained and patient given pain medication.  Lab work notable for leukocytosis.  Will obtain CT scan to evaluate for the above.  On recheck, patient states he feels much better after morphine.  Final Clinical Impressions(s) / ED Diagnoses   Final diagnoses:  Lower abdominal pain    ED Discharge Orders    None       Merryl Hacker, MD 12/06/18 [phone]

## 2018-12-06 NOTE — Discharge Instructions (Addendum)
Your ct scan shows inflammation of your ileum- a part of your intestine.  It is next to your appendix but is not involving your appendix.  If your pain worsens, you begin running a fever, or it is not getting better in the next few days, please contact your physician, or return to the ED.

## 2018-12-06 NOTE — ED Provider Notes (Signed)
73 year old man signed out to me by Dr. Dina Rich.  He presented complaining of lower abdominal pain.  He has had some watery diarrhea.  He has a leukocytosis.  Patient denies any vomiting or nausea.  He has been taking p.o. without difficulty.  CT scan is pending on receiving patient CT obtained and reviewed Significant for distal ileal inflammation with adjacent normal-appearing appendix.  Results discussed with patient.  Plan Cipro and Flagyl.  Patient advised regarding return precautions and voices understanding. Noted the patient is in atrial flutter.  He states that he had stopped his Eliquis as he was supposed to have an ablation.  However, ablation was not done due to covered pandemic.  Patient did not restart his Eliquis.  I am calling cardiology to discuss whether or not he should be back on his Eliquis.  Patient states he does not want to be on Eliquis unless "they give me a good reason". Discussed with Dr. Harrell Gave and agrees that with [place] vasc 1 patient can continue off eliquis as he is not rescheduled for ablation at this time.    [place] vasc calculated at [place], [place], MD 12/06/18 [phone]

## 2018-12-06 NOTE — ED Notes (Signed)
Patient verbalizes understanding of discharge instructions. Opportunity for questioning and answers were provided. Armband removed by staff, pt discharged from ED.  

## 2018-12-15 ENCOUNTER — Ambulatory Visit: Payer: Medicare Other | Admitting: Cardiology

## 2019-01-06 NOTE — Telephone Encounter (Signed)
Left detailed message informing pt that we would be reaching out soon to reschedule procedure as we have clearance to do so now. Pt informed that I would follow up in next few weeks, hopefully, to reschedule procedure. Asked pt to call back if he had further questions.

## 2019-01-06 NOTE — Telephone Encounter (Signed)
° °  Patient wants to discuss rescheduling ablation

## 2019-01-26 ENCOUNTER — Telehealth: Payer: Self-pay | Admitting: *Deleted

## 2019-01-26 NOTE — Telephone Encounter (Signed)
lmtcb to discuss scheduling tele-health visit with Dr. Curt Bears (possibly this Friday)

## 2019-01-27 NOTE — Telephone Encounter (Signed)
lmtcb

## 2019-01-28 NOTE — Telephone Encounter (Signed)
Called patient to verbally go over Virtual Visit Appointment.  LVMTCB if he has any questions. Sherri has left MyChart instructions for him about the Virtual Visit and Consent for appointment.

## 2019-01-28 NOTE — Progress Notes (Signed)
Electrophysiology TeleHealth Note   Due to national recommendations of social distancing due to COVID 19, an audio/video telehealth visit is felt to be most appropriate for this patient at this time.  See Epic message for the patient's consent to telehealth for [facility].   Date:  01/29/2019   ID:  David [name], DOB 04-Jan-1946, MRN [id number]  Location: patient's home  Provider location: [address] [place]  Evaluation Performed: Follow-up visit  PCP:  Wayland Salinas, MD  Cardiologist:  Avyan Livesay Meredith Leeds, MD  Electrophysiologist:  Dr Curt Bears  Chief Complaint:  Atrial flutter  History of Present Illness:    David [name] is a 73 y.o. male who presents via audio/video conferencing for a telehealth visit today.  Since last being seen in our clinic, the patient reports doing very well.  Today, he denies symptoms of palpitations, chest pain, shortness of breath,  lower extremity edema, dizziness, presyncope, or syncope.  The patient is otherwise without complaint today.  The patient denies symptoms of fevers, chills, cough, or new SOB worrisome for COVID 19.  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  Today, denies symptoms of palpitations, chest pain, shortness of breath, orthopnea, PND, lower extremity edema, claudication, dizziness, presyncope, syncope, bleeding, or neurologic sequela. The patient is tolerating medications without difficulties.    Past Medical History:  Diagnosis Date  . Asthma    exercise-induced; prn inhaler  . AV block, 2nd degree   . Bilateral inguinal hernia (BIH) 10/16/2012   Overview:  [facility] -Dr. Brantley Stage.  Observation for now 10/2012  . Complete heart block ([place]) 04/04/2018  . Insomnia 07/14/2014  . Mobitz type 1 second degree atrioventricular block   . OA (osteoarthritis) 09/24/2012  . 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 Reactive airway disease 09/24/2012  . Rotator cuff tear, right    impingement, AC arthrosis    Past Surgical History:  Procedure Laterality Date  . LEFT HEART CATH AND CORONARY ANGIOGRAPHY N/A 04/06/2018   Procedure: LEFT HEART CATH AND CORONARY ANGIOGRAPHY;  Surgeon: Jettie Booze, MD;  Location: [facility];  Service: Cardiovascular;  Laterality: N/A;  . LUMBAR LAMINECTOMY  age 37s  . REFRACTIVE SURGERY    . SHOULDER ARTHROSCOPY  08/06/2011   Procedure: ARTHROSCOPY SHOULDER;  Surgeon: Cammie Sickle., MD;  Location: [facility];  Service: Orthopedics;  Laterality: Right;  with subacromial decompression, arthroscopic subscapularis repair, and rotator cuff repair  . TOTAL KNEE ARTHROPLASTY Right 08/10/2018   Procedure: TOTAL KNEE ARTHROPLASTY;  Surgeon: Frederik Pear, MD;  Location: [facility];  Service: Orthopedics;  Laterality: Right;  . TRIGGER FINGER RELEASE Right 06/30/2014   Procedure: RELEASE A-1 PULLEY RIGHT THUMB;  Surgeon: Daryll Brod, MD;  Location: [facility];  Service: Orthopedics;  Laterality: Right;  ANESTHESIA:  IV REGIONAL FAB    Current Outpatient Medications  Medication Sig Dispense Refill  . budesonide-formoterol (SYMBICORT) 160-4.5 MCG/ACT inhaler Inhale 1 puff into the lungs daily as needed (ASTHMA).     . ciprofloxacin (CIPRO) 500 MG tablet Take 1 tablet (500 mg total) by mouth every 12 (twelve) hours. 14 tablet 0  . ELIQUIS 5 MG TABS tablet Take 5 mg by mouth 2 (two) times daily.    . metroNIDAZOLE (FLAGYL) 500 MG tablet Take 1 tablet (500 mg total) by mouth 2 (two) times daily. 14 tablet 0  . multivitamin (THERAGRAN) per tablet Take 1 tablet by mouth daily.  No current facility-administered medications for this visit.     Allergies:   Codeine   Social History:  The patient  reports that he has never smoked. He has never used smokeless tobacco. He reports current alcohol use. He reports that he does not use drugs.   Family History:  The patient's  family history includes Alzheimer's  disease in his father; Heart Problems in his mother; Prostate cancer in his father.   ROS:  Please see the history of present illness.   All other systems are personally reviewed and negative.    Exam:    Vital Signs:  There were no vitals taken for this visit.  Well appearing, alert and conversant, regular work of breathing,  good skin color Eyes- anicteric, neuro- grossly intact, skin- no apparent rash or lesions or cyanosis, mouth- oral mucosa is pink   Labs/Other Tests and Data Reviewed:    Recent Labs: 04/04/2018: B Natriuretic Peptide 301.3 04/05/2018: Magnesium 2.0; TSH 2.137 12/06/2018: ALT 21; BUN 23; Creatinine, Ser 1.03; Hemoglobin 15.3; Platelets 200; Potassium 4.2; Sodium 136   Wt Readings from Last 3 Encounters:  12/06/18 160 lb (72.6 kg)  11/06/18 166 lb (75.3 kg)  09/22/18 163 lb (73.9 kg)     Other studies personally reviewed: Additional studies/ records that were reviewed today include: 12/10/2018 ECG personally reviewed Review of the above records today demonstrates: Atrial flutter    ASSESSMENT & PLAN:    1.  2:1 AV block: has been asymptomatic in the past and has not wanted pacemaker placement. Reassessment post ablation.  2. Typical atrial flutter: On eliquis. Minimal symptoms. Plan for ablation. Risks and benefits discussed. Risks include but not limited to bleeding, infection, tamponade, heart block, stroke. The patient understands the risks and has agreed to the procedure.  He is planning to restart Eliquis today.  Dornell Grasmick wait for 3 weeks prior to scheduling.  This patients CHA2DS2-VASc Score and unadjusted Ischemic Stroke Rate (% per year) is equal to 0.6 % stroke rate/year from a score of 1  Above score calculated as 1 point each if present [CHF, HTN, DM, Vascular=MI/PAD/Aortic Plaque, Age if 65-74, or Male] Above score calculated as 2 points each if present [Age > 75, or Stroke/TIA/TE]    COVID 19 screen The patient denies symptoms of COVID 19 at  this time.  The importance of social distancing was discussed today.  Follow-up: 2 months  Current medicines are reviewed at length with the patient today.   The patient does not have concerns regarding his medicines.  The following changes were made today:  none  Labs/ tests ordered today include:  No orders of the defined types were placed in this encounter.    Patient Risk:  after full review of this patients clinical status, I feel that they are at moderate risk at this time.  Today, I have spent 10 minutes with the patient with telehealth technology discussing atrialflutter .    Signed, Necole Minassian Meredith Leeds, MD  01/29/2019 8:10 AM     [facility] [address] [place] [address] [phone] (office) [phone] (fax)

## 2019-01-29 ENCOUNTER — Telehealth (INDEPENDENT_AMBULATORY_CARE_PROVIDER_SITE_OTHER): Payer: Medicare Other | Admitting: Cardiology

## 2019-01-29 ENCOUNTER — Other Ambulatory Visit: Payer: Self-pay

## 2019-01-29 DIAGNOSIS — I483 Typical atrial flutter: Secondary | ICD-10-CM

## 2019-02-12 ENCOUNTER — Telehealth: Payer: Self-pay | Admitting: *Deleted

## 2019-02-12 ENCOUNTER — Encounter: Payer: Self-pay | Admitting: *Deleted

## 2019-02-12 NOTE — Telephone Encounter (Signed)
lmtcb Informed pt that I would send procedure instructions via MyChart.  Made aware that I would follow up next week to arrange COVID screening.

## 2019-02-17 NOTE — Telephone Encounter (Signed)
Follow up    Pt is calling and needs to know where to go for his procedure and covid testing    Please call back

## 2019-02-18 NOTE — Telephone Encounter (Signed)
Discussed procedure instructions w/ pt. COVID screening scheduled for Saturday, 6/20. Screening instructions/location rv w/ pt. Post procedure f/u scheduled for 7/27. Patient verbalized understanding and agreeable to plan.

## 2019-02-18 NOTE — Telephone Encounter (Signed)
lmtcb or discuss through [place]

## 2019-02-20 ENCOUNTER — Other Ambulatory Visit (HOSPITAL_COMMUNITY)
Admission: RE | Admit: 2019-02-20 | Discharge: 2019-02-20 | Disposition: A | Discharge status: Home / Self Care | Payer: Medicare Other | Source: Ambulatory Visit | Attending: Cardiology | Admitting: Cardiology

## 2019-02-20 DIAGNOSIS — Z1159 Encounter for screening for other viral diseases: Secondary | ICD-10-CM | POA: Diagnosis present

## 2019-02-20 LAB — SARS CORONAVIRUS 2 (TAT 6-24 HRS): SARS Coronavirus 2: NEGATIVE

## 2019-02-23 NOTE — Anesthesia Preprocedure Evaluation (Addendum)
Anesthesia Evaluation  Patient identified by MRN, date of birth, ID band Patient awake    Reviewed: Allergy & Precautions, NPO status , Patient's Chart, lab work & pertinent test results  History of Anesthesia Complications Negative for: history of anesthetic complications  Airway Mallampati: II  TM Distance: >3 FB Neck ROM: Full    Dental  (+) Teeth Intact   Pulmonary asthma ,    Pulmonary exam normal        Cardiovascular (-) CAD and (-) CHF Normal cardiovascular exam+ dysrhythmias (AVB) Atrial Fibrillation      Neuro/Psych negative neurological ROS  negative psych ROS   GI/Hepatic negative GI ROS, Neg liver ROS,   Endo/Other  negative endocrine ROS  Renal/GU negative Renal ROS  negative genitourinary   Musculoskeletal  (+) Arthritis ,   Abdominal   Peds  Hematology negative hematology ROS (+)   Anesthesia Other Findings Unremarkable echo and cath 04/2018  Reproductive/Obstetrics                            Anesthesia Physical Anesthesia Plan  ASA: II  Anesthesia Plan: General   Post-op Pain Management:    Induction: Intravenous  PONV Risk Score and Plan: 2 and Ondansetron, Dexamethasone and Treatment may vary due to age or medical condition  Airway Management Planned: Oral ETT  Additional Equipment: None  Intra-op Plan:   Post-operative Plan: Extubation in OR  Informed Consent: I have reviewed the patients History and Physical, chart, labs and discussed the procedure including the risks, benefits and alternatives for the proposed anesthesia with the patient or authorized representative who has indicated his/her understanding and acceptance.     Dental advisory given  Plan Discussed with:   Anesthesia Plan Comments:        Anesthesia Quick Evaluation

## 2019-02-24 ENCOUNTER — Ambulatory Visit (HOSPITAL_COMMUNITY)
Admission: RE | Admit: 2019-02-24 | Discharge: 2019-02-24 | Disposition: A | Discharge status: Home / Self Care | Payer: Medicare Other | Attending: Cardiology | Admitting: Cardiology

## 2019-02-24 ENCOUNTER — Encounter (HOSPITAL_COMMUNITY): Payer: Self-pay | Admitting: Certified Registered Nurse Anesthetist

## 2019-02-24 ENCOUNTER — Encounter (HOSPITAL_COMMUNITY)
Admission: RE | Disposition: A | Discharge status: Home / Self Care | Payer: Self-pay | Source: Home / Self Care | Attending: Cardiology

## 2019-02-24 ENCOUNTER — Other Ambulatory Visit: Payer: Self-pay

## 2019-02-24 ENCOUNTER — Ambulatory Visit (HOSPITAL_COMMUNITY): Payer: Medicare Other | Admitting: Anesthesiology

## 2019-02-24 DIAGNOSIS — Z7951 Long term (current) use of inhaled steroids: Secondary | ICD-10-CM | POA: Diagnosis not present

## 2019-02-24 DIAGNOSIS — I4892 Unspecified atrial flutter: Secondary | ICD-10-CM | POA: Insufficient documentation

## 2019-02-24 DIAGNOSIS — Z885 Allergy status to narcotic agent status: Secondary | ICD-10-CM | POA: Insufficient documentation

## 2019-02-24 DIAGNOSIS — I483 Typical atrial flutter: Secondary | ICD-10-CM | POA: Diagnosis not present

## 2019-02-24 DIAGNOSIS — J45990 Exercise induced bronchospasm: Secondary | ICD-10-CM | POA: Diagnosis not present

## 2019-02-24 DIAGNOSIS — G4700 Insomnia, unspecified: Secondary | ICD-10-CM | POA: Insufficient documentation

## 2019-02-24 DIAGNOSIS — I4891 Unspecified atrial fibrillation: Secondary | ICD-10-CM | POA: Insufficient documentation

## 2019-02-24 DIAGNOSIS — Z7901 Long term (current) use of anticoagulants: Secondary | ICD-10-CM | POA: Diagnosis not present

## 2019-02-24 DIAGNOSIS — I442 Atrioventricular block, complete: Secondary | ICD-10-CM | POA: Diagnosis not present

## 2019-02-24 DIAGNOSIS — M1711 Unilateral primary osteoarthritis, right knee: Secondary | ICD-10-CM | POA: Diagnosis not present

## 2019-02-24 HISTORY — PX: A-FLUTTER ABLATION: EP1230

## 2019-02-24 LAB — BASIC METABOLIC PANEL
Anion gap: 10 (ref 5–15)
BUN: 33 mg/dL — ABNORMAL HIGH (ref 8–23)
CO2: 24 mmol/L (ref 22–32)
Calcium: 9.1 mg/dL (ref 8.9–10.3)
Chloride: 106 mmol/L (ref 98–111)
Creatinine, Ser: 1.37 mg/dL — ABNORMAL HIGH (ref 0.61–1.24)
GFR calc Af Amer: 60 mL/min — ABNORMAL LOW (ref >60)
GFR calc non Af Amer: 52 mL/min — ABNORMAL LOW (ref >60)
Glucose, Bld: 91 mg/dL (ref 70–99)
Potassium: 4.6 mmol/L (ref 3.5–5.1)
Sodium: 140 mmol/L (ref 135–145)

## 2019-02-24 LAB — CBC
HCT: 46.2 % (ref 39.0–52.0)
Hemoglobin: 15.5 g/dL (ref 13.0–17.0)
MCH: 31.5 pg (ref 26.0–34.0)
MCHC: 33.5 g/dL (ref 30.0–36.0)
MCV: 93.9 fL (ref 80.0–100.0)
Platelets: 191 10*3/uL (ref 150–400)
RBC: 4.92 MIL/uL (ref 4.22–5.81)
RDW: 17.2 % — ABNORMAL HIGH (ref 11.5–15.5)
WBC: 5.5 10*3/uL (ref 4.0–10.5)
nRBC: 0 % (ref 0.0–0.2)

## 2019-02-24 SURGERY — A-FLUTTER ABLATION
Anesthesia: General

## 2019-02-24 MED ORDER — FENTANYL CITRATE (PF) 250 MCG/5ML IJ SOLN
INTRAMUSCULAR | Status: DC | PRN
  Administered 2019-02-24 (×3): 25 ug via INTRAVENOUS

## 2019-02-24 MED ORDER — SODIUM CHLORIDE 0.9 % IV SOLN
INTRAVENOUS | Status: DC
  Administered 2019-02-24: 08:00:00 via INTRAVENOUS

## 2019-02-24 MED ORDER — LIDOCAINE HCL (CARDIAC) PF 100 MG/5ML IV SOSY
PREFILLED_SYRINGE | INTRAVENOUS | Status: DC | PRN
  Administered 2019-02-24: 80 mg via INTRAVENOUS

## 2019-02-24 MED ORDER — SODIUM CHLORIDE 0.9% FLUSH: 3.0000 mL | INTRAVENOUS | Status: DC | PRN

## 2019-02-24 MED ORDER — DEXAMETHASONE SODIUM PHOSPHATE 10 MG/ML IJ SOLN
INTRAMUSCULAR | Status: DC | PRN
  Administered 2019-02-24: 5 mg via INTRAVENOUS

## 2019-02-24 MED ORDER — SODIUM CHLORIDE 0.9% FLUSH: 3.0000 mL | Freq: Two times a day (BID) | INTRAVENOUS | Status: DC

## 2019-02-24 MED ORDER — HEPARIN (PORCINE) IN NACL 1000-0.9 UT/500ML-% IV SOLN
INTRAVENOUS | Status: AC
  Filled 2019-02-24: qty 500

## 2019-02-24 MED ORDER — SODIUM CHLORIDE 0.9 % IV SOLN: 250.0000 mL | INTRAVENOUS | Status: DC | PRN

## 2019-02-24 MED ORDER — FENTANYL CITRATE (PF) 100 MCG/2ML IJ SOLN
INTRAMUSCULAR | Status: AC
  Filled 2019-02-24: qty 2

## 2019-02-24 MED ORDER — BUPIVACAINE HCL (PF) 0.25 % IJ SOLN
INTRAMUSCULAR | Status: DC | PRN
  Administered 2019-02-24: 20 mL

## 2019-02-24 MED ORDER — HEPARIN (PORCINE) IN NACL 1000-0.9 UT/500ML-% IV SOLN
INTRAVENOUS | Status: DC | PRN
  Administered 2019-02-24 (×2): 500 mL

## 2019-02-24 MED ORDER — ONDANSETRON HCL 4 MG/2ML IJ SOLN: 4.0000 mg | Freq: Four times a day (QID) | INTRAMUSCULAR | Status: DC | PRN

## 2019-02-24 MED ORDER — ACETAMINOPHEN 325 MG PO TABS: 650.0000 mg | ORAL_TABLET | ORAL | Status: DC | PRN

## 2019-02-24 MED ORDER — PROPOFOL 10 MG/ML IV BOLUS
INTRAVENOUS | Status: DC | PRN
  Administered 2019-02-24: 150 mg via INTRAVENOUS

## 2019-02-24 MED ORDER — EPHEDRINE SULFATE-NACL 50-0.9 MG/10ML-% IV SOSY
PREFILLED_SYRINGE | INTRAVENOUS | Status: DC | PRN
  Administered 2019-02-24: 5 mg via INTRAVENOUS
  Administered 2019-02-24: 10 mg via INTRAVENOUS
  Administered 2019-02-24: 5 mg via INTRAVENOUS

## 2019-02-24 MED ORDER — BUPIVACAINE HCL (PF) 0.25 % IJ SOLN
INTRAMUSCULAR | Status: AC
  Filled 2019-02-24: qty 30

## 2019-02-24 MED ORDER — ONDANSETRON HCL 4 MG/2ML IJ SOLN
INTRAMUSCULAR | Status: DC | PRN
  Administered 2019-02-24: 4 mg via INTRAVENOUS

## 2019-02-24 MED ORDER — SODIUM CHLORIDE 0.9 % IV SOLN
INTRAVENOUS | Status: DC | PRN
  Administered 2019-02-24: 20 ug/min via INTRAVENOUS

## 2019-02-24 MED ORDER — FENTANYL CITRATE (PF) 250 MCG/5ML IJ SOLN: INTRAMUSCULAR | Status: DC | PRN

## 2019-02-24 SURGICAL SUPPLY — 14 items
BLANKET WARM UNDERBOD FULL ACC (MISCELLANEOUS) ×1 IMPLANT
CATH DUODECA HALO/ISMUS 7FR (CATHETERS) ×1 IMPLANT
CATH EZ STEER NAV 8MM D-F CUR (ABLATOR) ×1 IMPLANT
CATH EZ STEER NAV 8MM F-J CUR (ABLATOR) ×1 IMPLANT
CATH WEBSTER BI DIR CS D-F CRV (CATHETERS) ×1 IMPLANT
PACK EP LATEX FREE (CUSTOM PROCEDURE TRAY) ×2
PACK EP LF (CUSTOM PROCEDURE TRAY) ×1 IMPLANT
PAD PRO RADIOLUCENT 2001M-C (PAD) ×2 IMPLANT
PATCH CARTO3 (PAD) ×1 IMPLANT
SHEATH PINNACLE 6F 10CM (SHEATH) ×1 IMPLANT
SHEATH PINNACLE 7F 10CM (SHEATH) ×1 IMPLANT
SHEATH PINNACLE 8F 10CM (SHEATH) ×1 IMPLANT
SHEATH PINNACLE 9F 10CM (SHEATH) ×1 IMPLANT
SHEATH SWARTZ RAMP 8.5F 60CM (SHEATH) ×1 IMPLANT

## 2019-02-24 NOTE — Anesthesia Postprocedure Evaluation (Signed)
Anesthesia Post Note  Patient: David [name]  Procedure(s) Performed: A-FLUTTER ABLATION (N/A )     Patient location during evaluation: PACU Anesthesia Type: General Level of consciousness: awake and alert Pain management: pain level controlled Vital Signs Assessment: post-procedure vital signs reviewed and stable Respiratory status: spontaneous breathing, nonlabored ventilation and respiratory function stable Cardiovascular status: blood pressure returned to baseline and stable Postop Assessment: no apparent nausea or vomiting Anesthetic complications: no    Last Vitals:  Vitals:   02/24/19 1200 02/24/19 1215  BP: 132/81 132/74  Pulse: (!) 49 (!) 50  Resp: 15 10  Temp:    SpO2: 94% 95%    Last Pain:  Vitals:   02/24/19 1200  TempSrc:   PainSc: 0-No pain                 Lidia Collum

## 2019-02-24 NOTE — H&P (Signed)
David [name] has presented today for surgery, with the diagnosis of atrial flutter.  The various methods of treatment have been discussed with the patient and family. After consideration of risks, benefits and other options for treatment, the patient has consented to  Procedure(s): Catheter ablation as a surgical intervention .  Risks include but not limited to bleeding, tamponade, heart block, stroke, damage to surrounding organs, among others. The patient's history has been reviewed, patient examined, no change in status, stable for surgery.  I have reviewed the patient's chart and labs.  Questions were answered to the patient's satisfaction.    Lizania Bouchard Curt Bears, MD 02/24/2019 7:23 AM

## 2019-02-24 NOTE — Progress Notes (Signed)
Site area:  Rt groin fv sheaths x2 Site Prior to Removal:  Level 0 Pressure Applied For:  15 minutes Manual:   yes Patient Status During Pull:  stable Post Pull Site:  Level  0 Post Pull Instructions Given:  yes Post Pull Pulses Present:  Rt dp palpable Dressing Applied:  Gauze and tegaderm Bedrest begins @  7543 Comments:   IV saline locked

## 2019-02-24 NOTE — Anesthesia Procedure Notes (Signed)
Procedure Name: LMA Insertion Date/Time: 02/24/2019 8:23 AM Performed by: Julieta Bellini, CRNA Pre-anesthesia Checklist: Patient identified, Emergency Drugs available, Suction available and Patient being monitored Patient Re-evaluated:Patient Re-evaluated prior to induction Oxygen Delivery Method: Circle system utilized Preoxygenation: Pre-oxygenation with 100% oxygen Induction Type: IV induction LMA: LMA inserted LMA Size: 4.0 Number of attempts: 1 Placement Confirmation: positive ETCO2,  breath sounds checked- equal and bilateral and CO2 detector Tube secured with: Tape Dental Injury: Teeth and Oropharynx as per pre-operative assessment

## 2019-02-24 NOTE — Discharge Instructions (Signed)
Cardiac Ablation, Care After This sheet gives you information about how to care for yourself after your procedure. Your health care provider may also give you more specific instructions. If you have problems or questions, contact your health care provider. What can I expect after the procedure? After the procedure, it is common to have:  Bruising around your puncture site.  Tenderness around your puncture site.  Skipped heartbeats.  Tiredness (fatigue). Follow these instructions at home: Puncture site care   Follow instructions from your health care provider about how to take care of your puncture site. Make sure you: ? Wash your hands with soap and water before you change your bandage (dressing). If soap and water are not available, use hand sanitizer. ? Change your dressing as told by your health care provider. ? Leave stitches (sutures), skin glue, or adhesive strips in place. These skin closures may need to stay in place for up to 2 weeks. If adhesive strip edges start to loosen and curl up, you may trim the loose edges. Do not remove adhesive strips completely unless your health care provider tells you to do that.  Check your puncture site every day for signs of infection. Check for: ? Redness, swelling, or pain. ? Fluid or blood. If your puncture site starts to bleed, lie down on your back, apply firm pressure to the area, and contact your health care provider. ? Warmth. ? Pus or a bad smell. Driving  Ask your health care provider when it is safe for you to drive again after the procedure.  Do not drive or use heavy machinery while taking prescription pain medicine.  Do not drive for 24 hours if you were given a medicine to help you relax (sedative) during your procedure. Activity  Avoid activities that take a lot of effort for at least 3 days after your procedure.  Do not lift anything that is heavier than 10 lb (4.5 kg), or the limit that you are told, until your health  care provider says that it is safe.  Return to your normal activities as told by your health care provider. Ask your health care provider what activities are safe for you. General instructions  Take over-the-counter and prescription medicines only as told by your health care provider.  Do not use any products that contain nicotine or tobacco, such as cigarettes and e-cigarettes. If you need help quitting, ask your health care provider.  Do not take baths, swim, or use a hot tub until your health care provider approves.  Do not drink alcohol for 24 hours after your procedure.  Keep all follow-up visits as told by your health care provider. This is important. Contact a health care provider if:  You have redness, mild swelling, or pain around your puncture site.  You have fluid or blood coming from your puncture site that stops after applying firm pressure to the area.  Your puncture site feels warm to the touch.  You have pus or a bad smell coming from your puncture site.  You have a fever.  You have chest pain or discomfort that spreads to your neck, jaw, or arm.  You are sweating a lot.  You feel nauseous.  You have a fast or irregular heartbeat.  You have shortness of breath.  You are dizzy or light-headed and feel the need to lie down.  You have pain or numbness in the arm or leg closest to your puncture site. Get help right away if:  Your puncture  puncture site.  Get help right away if:  Your puncture site suddenly swells.  Your puncture site is bleeding and the bleeding does not stop after applying firm pressure to the area.  These symptoms may represent a serious problem that is an emergency. Do not wait to see if the symptoms will go away. Get medical help right away. Call your local emergency services (911 in the [place] Do not drive yourself to the hospital.  Summary  After the procedure, it is normal to have bruising and tenderness at the puncture site in your groin, neck, or forearm.  Check your puncture site every day for signs of infection.  Get help right away if your puncture site is  bleeding and the bleeding does not stop after applying firm pressure to the area. This is a medical emergency.  This information is not intended to replace advice given to you by your health care provider. Make sure you discuss any questions you have with your health care provider.  Document Released: 11/28/2016 Document Revised: 11/28/2016 Document Reviewed: 11/28/2016  Elsevier Interactive Patient Education  2019 [employer]

## 2019-02-24 NOTE — Progress Notes (Signed)
No bleeding or swelling noted after ambulation 

## 2019-02-24 NOTE — Transfer of Care (Signed)
Immediate Anesthesia Transfer of Care Note  Patient: David [name]  Procedure(s) Performed: A-FLUTTER ABLATION (N/A )  Patient Location: Cath Lab  Anesthesia Type:General  Level of Consciousness: drowsy and patient cooperative  Airway & Oxygen Therapy: Patient Spontanous Breathing  Post-op Assessment: Report given to RN, Post -op Vital signs reviewed and stable and Patient moving all extremities X 4  Post vital signs: Reviewed and stable  Last Vitals:  Vitals Value Taken Time  BP    Temp    Pulse    Resp    SpO2      Last Pain:  Vitals:   02/24/19 0739  TempSrc:   PainSc: 0-No pain         Complications: No apparent anesthesia complications

## 2019-02-24 NOTE — Progress Notes (Signed)
D/c instructions reviewed with wife and patient.  All questions answered and patient and wife verbalized understanding

## 2019-03-01 ENCOUNTER — Telehealth: Payer: Self-pay

## 2019-03-01 NOTE — Telephone Encounter (Signed)
   [facility] Pre-operative Risk Assessment    Request for surgical clearance:  1. What type of surgery is being performed? Colonoscopy   2. When is this surgery scheduled? 03/23/2019   3. What type of clearance is required (medical clearance vs. Pharmacy clearance to hold med vs. Both)? Both  4. Are there any medications that need to be held prior to surgery and how long? Eliquis   5. Practice name and name of physician performing surgery? Dr Benson Norway   6. What is your office phone number [phone]    7.   What is your office fax number [phone]  8.   Anesthesia type (None, local, MAC, general) ? MAC   David [name] 03/01/2019, 4:03 PM  _________________________________________________________________   (provider comments below)

## 2019-03-02 NOTE — Telephone Encounter (Signed)
Would not recommend interrupting anticoagulation for at least 90 days post ablation. If colonoscopy is not urgent and can be delayed, would recommend delaying until after 05/25/2019  If Dr. Curt Bears says its ok to proceed, ok to hold anticoagulation one day. Patient on Eliquis for aflutter.    CHADS2-VASc score of  1 (CHF, HTN, AGE, DM2, stroke/tia x 2, CAD, AGE, male)  CrCl 47 ml/min

## 2019-03-02 NOTE — Telephone Encounter (Signed)
Dr. Curt Bears, This patient had an ablation on 02/24/19. We have received a clearance request to hold eliquis for a colonoscopy scheduled for 03/23/19. Can he interrupt eliquis inside one month after an ablation? If so, for how long? If not, please give parameters of when [facility] can be held 1-2 days.  Thanks Angie

## 2019-03-03 ENCOUNTER — Telehealth: Payer: Self-pay | Admitting: Cardiology

## 2019-03-03 NOTE — Telephone Encounter (Signed)
I David [name] see the patient one month after ablation to discuss stopping anticoagulation. Patient is also in complete heart block so would prefer an evaluation prior to further sedation.

## 2019-03-03 NOTE — Telephone Encounter (Signed)
I called Mr Fix to let him know Dr Curt Bears recommendations about holding off on his colonoscopy.  He would to speak with Dr Curt Bears. I'm forwarding this on to him.  Kerin Ransom PA-C 03/03/2019 9:19 AM

## 2019-03-03 NOTE — Telephone Encounter (Signed)
   Primary Cardiologist: Will Meredith Leeds, MD  Chart reviewed as part of pre-operative protocol coverage. Dr Curt Bears was contacted 03/03/2019 in reference to pre-operative risk assessment for pending surgery as outlined below.    Due to his recent ablation, Daegan Arizmendi will require a follow-up visit in one month for further pre-operative risk assessment. Dr Curt Bears will comment on holding anticoagulation and cardiac clearance at that time.   Kerin Ransom, PA-C 03/03/2019, 9:11 AM

## 2019-03-26 ENCOUNTER — Telehealth: Payer: Self-pay | Admitting: *Deleted

## 2019-03-26 NOTE — Telephone Encounter (Signed)
LVMTCB Calling patient to confirm in office appointment on 7/27 David [name]. PATIENT NEEDS COVID PRESCREENING

## 2019-03-29 ENCOUNTER — Ambulatory Visit (INDEPENDENT_AMBULATORY_CARE_PROVIDER_SITE_OTHER): Payer: Medicare Other | Admitting: Cardiology

## 2019-03-29 ENCOUNTER — Other Ambulatory Visit: Payer: Self-pay

## 2019-03-29 ENCOUNTER — Encounter: Payer: Self-pay | Admitting: Cardiology

## 2019-03-29 VITALS — BP 138/90 | HR 53 | Ht 68.0 in | Wt 166.2 lb

## 2019-03-29 DIAGNOSIS — I483 Typical atrial flutter: Secondary | ICD-10-CM | POA: Diagnosis not present

## 2019-03-29 NOTE — Patient Instructions (Addendum)
Medication Instructions:  Your physician has recommended you make the following change in your medication:  1. STOP Eliquis  * If you need a refill on your cardiac medications before your next appointment, please call your pharmacy.   Labwork: None ordered  Testing/Procedures: None ordered  Follow-Up: Your physician wants you to follow-up in: 6 months with Dr. Curt Bears.  You will receive a reminder letter in the mail two months in advance. If you don't receive a letter, please call our office to schedule the follow-up appointment.  Thank you for choosing [facility]!!   Trinidad Curet, RN [phone]

## 2019-03-29 NOTE — Progress Notes (Signed)
Electrophysiology Office Note   Date:  03/29/2019   ID:  David [name], DOB 11/13/1945, MRN [id number]  PCP:  Wayland Salinas, MD  Cardiologist:  Marlou Porch Primary Electrophysiologist:  Sharbel Sahagun Meredith Leeds, MD    No chief complaint on file.    History of Present Illness: David [name] is a 73 y.o. male who is being seen today for the evaluation of bradycardia at the request of Ryter-Brown, Shyrl Numbers, *. Presenting today for electrophysiology evaluation.  He initially presented to the hospital with weight gain and lower extremity edema.  Echo showed a normal ejection fraction and left heart catheterization showed no evidence of coronary disease.  He was found to have Mobitz 1 and 2-1 AV block while in the hospital.  He wore a Holter monitor that showed similar results.  He was also noted to be in atrial flutter and has since had an atrial flutter ablation.  Today, denies symptoms of palpitations, chest pain, shortness of breath, orthopnea, PND, lower extremity edema, claudication, dizziness, presyncope, syncope, bleeding, or neurologic sequela. The patient is tolerating medications without difficulties.  He has been continuing to exercise.  He is able to ride his bike and gets his heart rate into the 120s consistently.  He does continue to have AV nodal conduction disease.   Past Medical History:  Diagnosis Date  . Asthma    exercise-induced; prn inhaler  . AV block, 2nd degree   . Bilateral inguinal hernia (BIH) 10/16/2012   Overview:  [facility] -Dr. Brantley Stage.  Observation for now 10/2012  . Complete heart block ([place]) 04/04/2018  . Insomnia 07/14/2014  . Mobitz type 1 second degree atrioventricular block   . OA (osteoarthritis) 09/24/2012  . 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 Reactive airway disease 09/24/2012  . Rotator cuff tear, right    impingement, AC arthrosis   Past Surgical History:  Procedure Laterality Date  . A-FLUTTER ABLATION N/A 02/24/2019   Procedure: A-FLUTTER ABLATION;  Surgeon: Constance Haw, MD;  Location: [place] [facility];  Service: Cardiovascular;  Laterality: N/A;  . LEFT HEART CATH AND CORONARY ANGIOGRAPHY N/A 04/06/2018   Procedure: LEFT HEART CATH AND CORONARY ANGIOGRAPHY;  Surgeon: Jettie Booze, MD;  Location: [facility] [facility];  Service: Cardiovascular;  Laterality: N/A;  . LUMBAR LAMINECTOMY  age 76s  . REFRACTIVE SURGERY    . SHOULDER ARTHROSCOPY  08/06/2011   Procedure: ARTHROSCOPY SHOULDER;  Surgeon: Cammie Sickle., MD;  Location: [facility];  Service: Orthopedics;  Laterality: Right;  with subacromial decompression, arthroscopic subscapularis repair, and rotator cuff repair  . TOTAL KNEE ARTHROPLASTY Right 08/10/2018   Procedure: TOTAL KNEE ARTHROPLASTY;  Surgeon: Frederik Pear, MD;  Location: [facility];  Service: Orthopedics;  Laterality: Right;  . TRIGGER FINGER RELEASE Right 06/30/2014   Procedure: RELEASE A-1 PULLEY RIGHT THUMB;  Surgeon: Daryll Brod, MD;  Location: [facility];  Service: Orthopedics;  Laterality: Right;  ANESTHESIA:  IV REGIONAL FAB     Current Outpatient Medications  Medication Sig Dispense Refill  . budesonide-formoterol (SYMBICORT) 160-4.5 MCG/ACT inhaler Inhale 1 puff into the lungs 2 (two) times daily as needed (ASTHMA).     . multivitamin (THERAGRAN) per tablet Take 1 tablet by mouth daily.       No current facility-administered medications for this visit.     Allergies:   Codeine   Social History:  The patient  reports that he has never smoked. He has never used smokeless  tobacco. He reports current alcohol use. He reports that he does not use drugs.   Family History:  The patient's family history includes Alzheimer's disease in his father; Heart Problems in his mother; Prostate cancer in his father.   ROS:  Please see the history of present illness.   Otherwise, review of systems is positive for none.   All other systems are reviewed  and negative.   PHYSICAL EXAM: VS:  BP 138/90   Pulse (!) 53   Ht 5\' 8"  (1.727 m)   Wt 166 lb 3.2 oz (75.4 kg)   SpO2 96%   BMI 25.27 kg/m  , BMI Body mass index is 25.27 kg/m. GEN: Well nourished, well developed, in no acute distress  HEENT: normal  Neck: no JVD, carotid bruits, or masses Cardiac: RRR; no murmurs, rubs, or gallops,no edema  Respiratory:  clear to auscultation bilaterally, normal work of breathing GI: soft, nontender, nondistended, + BS MS: no deformity or atrophy  Skin: warm and dry Neuro:  Strength and sensation are intact Psych: euthymic mood, full affect  EKG:  EKG is ordered today. Personal review of the ekg ordered shows sinus rhythm, second-degree AV block  Recent Labs: 04/04/2018: B Natriuretic Peptide 301.3 04/05/2018: Magnesium 2.0; TSH 2.137 12/06/2018: ALT 21 02/24/2019: BUN 33; Creatinine, Ser 1.37; Hemoglobin 15.5; Platelets 191; Potassium 4.6; Sodium 140    Lipid Panel     Component Value Date/Time   CHOL 141 04/05/2018 0019   TRIG 27 04/05/2018 0019   HDL 37 (L) 04/05/2018 0019   CHOLHDL 3.8 04/05/2018 0019   VLDL 5 04/05/2018 0019   LDLCALC 99 04/05/2018 0019     Wt Readings from Last 3 Encounters:  03/29/19 166 lb 3.2 oz (75.4 kg)  02/24/19 160 lb (72.6 kg)  12/06/18 160 lb (72.6 kg)      Other studies Reviewed: Additional studies/ records that were reviewed today include: LHC 04/06/18  Review of the above records today demonstrates:   The left ventricular systolic function is normal.  LV end diastolic pressure is normal.  The left ventricular ejection fraction is 55-65% by visual estimate.  There is no aortic valve stenosis.  No significant CAD.  TTE 04/05/18 - Left ventricle: The cavity size was normal. Wall thickness was   normal. Systolic function was normal. The estimated ejection   fraction was in the range of 55% to 60%. Wall motion was normal;   there were no regional wall motion abnormalities. - Aortic valve:  Trileaflet; mildly thickened, mildly calcified   leaflets. - Right ventricle: The cavity size was mildly dilated. Wall   thickness was normal. - Tricuspid valve: There was trivial regurgitation.  Holter 04/20/18 - personally reviewed Minimum HR: 28 BPM at 7:36:27 AM(2) Maximum HR: 111 BPM at 9:36:54 AM(2) Average HR: 53 BPM 1% PVCs Sinus rhythm with mobitz I block, 2:1 AV block, and complete heart block during waking hours with narrow complex QRS  ASSESSMENT AND PLAN:  1.  2-1 second-degree AV block: Fortunately his QRS complex is narrow.  He is able to get his heart rate into the 120s.  It is certainly possible that his adrenaline surge when he is exercising improves his AV nodal conduction.  At this point, he would prefer to avoid pacemaker implantation.  We Harol Shabazz continue to follow.  2.  Typical appearing atrial flutter: Currently on Eliquis.  Status post ablation 02/24/2019.  We Zuria Fosdick plan to stop Eliquis.  This patients CHA2DS2-VASc Score and unadjusted Ischemic Stroke Rate (%  per year) is equal to 0.6 % stroke rate/year from a score of 1  Above score calculated as 1 point each if present [CHF, HTN, DM, Vascular=MI/PAD/Aortic Plaque, Age if 65-74, or Male] Above score calculated as 2 points each if present [Age > 75, or Stroke/TIA/TE]  3.  Preoperative evaluation: Plan for colonoscopy.  He is off his Eliquis.  Would be an intermediate risk for a low risk procedure.  No other changes.  Avoid AV nodal blockers.  Current medicines are reviewed at length with the patient today.   The patient does not have concerns regarding his medicines.  The following changes were made today: Stop Eliquis  Labs/ tests ordered today include:  Orders Placed This Encounter  Procedures  . EKG 12-Lead   Case discussed with GI  Disposition:   FU with Kwan Shellhammer 6 months  Signed, Venola Castello Meredith Leeds, MD  03/29/2019 11:24 AM     [facility] [address] [place]  [phone] (office) [phone] (fax)

## 2019-04-09 ENCOUNTER — Other Ambulatory Visit: Payer: Self-pay | Admitting: Gastroenterology

## 2019-04-16 ENCOUNTER — Other Ambulatory Visit: Payer: Self-pay | Admitting: *Deleted

## 2019-04-16 DIAGNOSIS — Z20822 Contact with and (suspected) exposure to covid-19: Secondary | ICD-10-CM

## 2019-04-18 LAB — NOVEL CORONAVIRUS, NAA: SARS-CoV-2, NAA: NOT DETECTED

## 2019-04-30 ENCOUNTER — Other Ambulatory Visit: Payer: Self-pay

## 2019-04-30 DIAGNOSIS — Z20822 Contact with and (suspected) exposure to covid-19: Secondary | ICD-10-CM

## 2019-05-02 LAB — NOVEL CORONAVIRUS, NAA: SARS-CoV-2, NAA: DETECTED — AB

## 2019-05-18 ENCOUNTER — Other Ambulatory Visit (HOSPITAL_COMMUNITY)
Admission: RE | Admit: 2019-05-18 | Discharge: 2019-05-18 | Disposition: A | Discharge status: Home / Self Care | Payer: Medicare Other | Source: Ambulatory Visit | Attending: Gastroenterology | Admitting: Gastroenterology

## 2019-05-18 NOTE — Progress Notes (Signed)
Pt will not be retested for COVID due to him being positive 2 weeks ago. Based on our testing guidelines, we will not re-swab if there was a positive result within 90 days.

## 2019-07-20 ENCOUNTER — Other Ambulatory Visit (HOSPITAL_COMMUNITY)
Admission: RE | Admit: 2019-07-20 | Discharge: 2019-07-20 | Disposition: A | Discharge status: Home / Self Care | Payer: Medicare Other | Source: Ambulatory Visit | Attending: Gastroenterology | Admitting: Gastroenterology

## 2019-07-20 DIAGNOSIS — Z01812 Encounter for preprocedural laboratory examination: Secondary | ICD-10-CM | POA: Insufficient documentation

## 2019-07-20 DIAGNOSIS — Z20828 Contact with and (suspected) exposure to other viral communicable diseases: Secondary | ICD-10-CM | POA: Insufficient documentation

## 2019-07-21 ENCOUNTER — Encounter (HOSPITAL_COMMUNITY): Payer: Self-pay | Admitting: *Deleted

## 2019-07-21 ENCOUNTER — Other Ambulatory Visit: Payer: Self-pay

## 2019-07-21 LAB — NOVEL CORONAVIRUS, NAA (HOSP ORDER, SEND-OUT TO REF LAB; TAT 18-24 HRS): SARS-CoV-2, NAA: NOT DETECTED

## 2019-07-23 ENCOUNTER — Ambulatory Visit (HOSPITAL_COMMUNITY): Payer: Medicare Other | Admitting: Certified Registered Nurse Anesthetist

## 2019-07-23 ENCOUNTER — Other Ambulatory Visit: Payer: Self-pay

## 2019-07-23 ENCOUNTER — Ambulatory Visit (HOSPITAL_COMMUNITY)
Admission: RE | Admit: 2019-07-23 | Discharge: 2019-07-23 | Disposition: A | Discharge status: Home / Self Care | Payer: Medicare Other | Attending: Gastroenterology | Admitting: Gastroenterology

## 2019-07-23 ENCOUNTER — Encounter (HOSPITAL_COMMUNITY): Payer: Self-pay

## 2019-07-23 ENCOUNTER — Encounter (HOSPITAL_COMMUNITY)
Admission: RE | Disposition: A | Discharge status: Home / Self Care | Payer: Self-pay | Source: Home / Self Care | Attending: Gastroenterology

## 2019-07-23 DIAGNOSIS — D123 Benign neoplasm of transverse colon: Secondary | ICD-10-CM | POA: Diagnosis not present

## 2019-07-23 DIAGNOSIS — K52832 Lymphocytic colitis: Secondary | ICD-10-CM | POA: Insufficient documentation

## 2019-07-23 DIAGNOSIS — Z96651 Presence of right artificial knee joint: Secondary | ICD-10-CM | POA: Diagnosis not present

## 2019-07-23 DIAGNOSIS — Z885 Allergy status to narcotic agent status: Secondary | ICD-10-CM | POA: Diagnosis not present

## 2019-07-23 DIAGNOSIS — K5730 Diverticulosis of large intestine without perforation or abscess without bleeding: Secondary | ICD-10-CM | POA: Insufficient documentation

## 2019-07-23 DIAGNOSIS — K529 Noninfective gastroenteritis and colitis, unspecified: Secondary | ICD-10-CM | POA: Diagnosis present

## 2019-07-23 DIAGNOSIS — R933 Abnormal findings on diagnostic imaging of other parts of digestive tract: Secondary | ICD-10-CM | POA: Diagnosis not present

## 2019-07-23 DIAGNOSIS — M1711 Unilateral primary osteoarthritis, right knee: Secondary | ICD-10-CM | POA: Insufficient documentation

## 2019-07-23 DIAGNOSIS — J45909 Unspecified asthma, uncomplicated: Secondary | ICD-10-CM | POA: Diagnosis not present

## 2019-07-23 HISTORY — PX: COLONOSCOPY WITH PROPOFOL: SHX5780

## 2019-07-23 HISTORY — PX: BIOPSY: SHX5522

## 2019-07-23 HISTORY — PX: POLYPECTOMY: SHX5525

## 2019-07-23 SURGERY — COLONOSCOPY WITH PROPOFOL
Anesthesia: Monitor Anesthesia Care

## 2019-07-23 MED ORDER — PROPOFOL 500 MG/50ML IV EMUL
INTRAVENOUS | Status: AC
  Filled 2019-07-23: qty 50

## 2019-07-23 MED ORDER — LACTATED RINGERS IV SOLN
INTRAVENOUS | Status: DC
  Administered 2019-07-23: 09:00:00 via INTRAVENOUS

## 2019-07-23 MED ORDER — PROPOFOL 500 MG/50ML IV EMUL
INTRAVENOUS | Status: DC | PRN
  Administered 2019-07-23: 100 ug/kg/min via INTRAVENOUS

## 2019-07-23 MED ORDER — PROPOFOL 10 MG/ML IV BOLUS
INTRAVENOUS | Status: AC
  Filled 2019-07-23: qty 20

## 2019-07-23 MED ORDER — SODIUM CHLORIDE 0.9 % IV SOLN: INTRAVENOUS | Status: DC

## 2019-07-23 MED ORDER — PROPOFOL 10 MG/ML IV BOLUS
INTRAVENOUS | Status: DC | PRN
  Administered 2019-07-23: 50 mg via INTRAVENOUS
  Administered 2019-07-23 (×2): 40 mg via INTRAVENOUS
  Administered 2019-07-23: 20 mg via INTRAVENOUS

## 2019-07-23 MED ORDER — EPHEDRINE SULFATE-NACL 50-0.9 MG/10ML-% IV SOSY
PREFILLED_SYRINGE | INTRAVENOUS | Status: DC | PRN
  Administered 2019-07-23: 10 mg via INTRAVENOUS
  Administered 2019-07-23 (×2): 5 mg via INTRAVENOUS

## 2019-07-23 SURGICAL SUPPLY — 22 items

## 2019-07-23 NOTE — Transfer of Care (Signed)
Immediate Anesthesia Transfer of Care Note  Patient: David [name]  Procedure(s) Performed: Procedure(s): COLONOSCOPY WITH PROPOFOL (N/A) BIOPSY  Patient Location: PACU  Anesthesia Type:MAC  Level of Consciousness: Patient easily awoken, sedated, comfortable, cooperative, following commands, responds to stimulation.   Airway & Oxygen Therapy: Patient spontaneously breathing, ventilating well, oxygen via simple oxygen mask.  Post-op Assessment: Report given to PACU RN, vital signs reviewed and stable, moving all extremities.   Post vital signs: Reviewed and stable.  Complications: No apparent anesthesia complications  Last Vitals:  Vitals Value Taken Time  BP    Temp    Pulse 61 07/23/19 0955  Resp 19 07/23/19 0955  SpO2 99 % 07/23/19 0955  Vitals shown include unvalidated device data.  Last Pain:  Vitals:   07/23/19 0848  TempSrc: Oral  PainSc: 0-No pain         Complications: No apparent anesthesia complications

## 2019-07-23 NOTE — Anesthesia Postprocedure Evaluation (Signed)
Anesthesia Post Note  Patient: David [name]  Procedure(s) Performed: COLONOSCOPY WITH PROPOFOL (N/A ) BIOPSY POLYPECTOMY     Patient location during evaluation: PACU Anesthesia Type: MAC Level of consciousness: awake and alert Pain management: pain level controlled Vital Signs Assessment: post-procedure vital signs reviewed and stable Respiratory status: spontaneous breathing, nonlabored ventilation, respiratory function stable and patient connected to nasal cannula oxygen Cardiovascular status: stable and blood pressure returned to baseline Postop Assessment: no apparent nausea or vomiting Anesthetic complications: no    Last Vitals:  Vitals:   07/23/19 0848 07/23/19 0955  BP: (!) 157/79 139/85  Pulse: (!) 38 61  Resp: 13 19  Temp: 36.7 C   SpO2: 99% 99%    Last Pain:  Vitals:   07/23/19 1000  TempSrc:   PainSc: 0-No pain                 Effie Berkshire

## 2019-07-23 NOTE — H&P (Signed)
  David [name] HPI: The patient was evaluated for diarrhea in March this year.  Since that time his bowel habits have not changed and he continues to have 7-8 loose to watery bowel movements.  He did suffer with abdominal pain and a CT scan of the abdomen was performed on 12/06/2018 and there was evidence of inflammation in the distal TI.  The patient is here today for further evaluation and work up of his diarrhea and TI inflammation.  Past Medical History:  Diagnosis Date  . Asthma    exercise-induced; prn inhaler  . AV block, 2nd degree   . Bilateral inguinal hernia (BIH) 10/16/2012   Overview:  [facility] -Dr. Brantley Stage.  Observation for now 10/2012  . Complete heart block ([place]) 04/04/2018  . Insomnia 07/14/2014  . Mobitz type 1 second degree atrioventricular block   . OA (osteoarthritis) 09/24/2012  . 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 Reactive airway disease 09/24/2012  . Rotator cuff tear, right    impingement, AC arthrosis    Past Surgical History:  Procedure Laterality Date  . A-FLUTTER ABLATION N/A 02/24/2019   Procedure: A-FLUTTER ABLATION;  Surgeon: Constance Haw, MD;  Location: [facility];  Service: Cardiovascular;  Laterality: N/A;  . LEFT HEART CATH AND CORONARY ANGIOGRAPHY N/A 04/06/2018   Procedure: LEFT HEART CATH AND CORONARY ANGIOGRAPHY;  Surgeon: Jettie Booze, MD;  Location: [facility];  Service: Cardiovascular;  Laterality: N/A;  . LUMBAR LAMINECTOMY  age 5s  . REFRACTIVE SURGERY    . SHOULDER ARTHROSCOPY  08/06/2011   Procedure: ARTHROSCOPY SHOULDER;  Surgeon: Cammie Sickle., MD;  Location: [facility];  Service: Orthopedics;  Laterality: Right;  with subacromial decompression, arthroscopic subscapularis repair, and rotator cuff repair  . TOTAL KNEE ARTHROPLASTY Right 08/10/2018   Procedure: TOTAL KNEE ARTHROPLASTY;  Surgeon: Frederik Pear, MD;  [facility];  Service: Orthopedics;  Laterality: Right;   . TRIGGER FINGER RELEASE Right 06/30/2014   Procedure: RELEASE A-1 PULLEY RIGHT THUMB;  Surgeon: Daryll Brod, MD;  Location: [facility];  Service: Orthopedics;  Laterality: Right;  ANESTHESIA:  IV REGIONAL FAB    Family History  Problem Relation Age of Onset  . Heart Problems Mother        had pacemaker, lived to 10  . Alzheimer's disease Father   . Prostate cancer Father     Social History:  reports that he has never smoked. He has never used smokeless tobacco. He reports current alcohol use. He reports that he does not use drugs.  Allergies:  Allergies  Allergen Reactions  . Codeine Itching    Medications:  Scheduled:  Continuous: . lactated ringers 20 mL/hr at 07/23/19 0857    No results found for this or any previous visit (from the past 24 hour(s)).   No results found.  ROS:  As stated above in the HPI otherwise negative.  Blood pressure (!) 157/79, pulse (!) 38, temperature 98.1 F (36.7 C), temperature source Oral, resp. rate 13, height 5\' 8"  (1.727 m), weight 72.6 kg, SpO2 99 %.    PE: Gen: NAD, Alert and Oriented HEENT:  [place]/AT, EOMI Neck: Supple, no LAD Lungs: CTA Bilaterally CV: RRR without M/G/R ABM: Soft, NTND, +BS Ext: No C/C/E  Assessment/Plan: 1) Diarrhea. 2) TI inflammation.  Plan: 1) Colonoscopy with biopsies.  Candise Crabtree D 07/23/2019, 8:56 AM

## 2019-07-23 NOTE — Op Note (Signed)
[facility] Patient Name: David [name] Procedure Date: 07/23/2019 MRN: [id number] Attending MD: Carol Ada , MD Date of Birth: 05-16-1946 CSN: [id number] Age: 73 Admit Type: Outpatient Procedure:                Colonoscopy Indications:              Chronic diarrhea, Abnormal CT of the GI tract Providers:                Carol Ada, MD, Baird Cancer, RN, Cherylynn Ridges,                            Technician, Janeece Agee, Technician, Heide Scales,                            CRNA Referring MD:              Medicines:                Propofol per Anesthesia Complications:            No immediate complications. Estimated Blood Loss:     Estimated blood loss: none. Estimated blood loss                            was minimal. Procedure:                Pre-Anesthesia Assessment:                           - Prior to the procedure, a History and Physical                            was performed, and patient medications and                            allergies were reviewed. The patient's tolerance of                            previous anesthesia was also reviewed. The risks                            and benefits of the procedure and the sedation                            options and risks were discussed with the patient.                            All questions were answered, and informed consent                            was obtained. Prior Anticoagulants: The patient has                            taken no previous anticoagulant or antiplatelet                            agents. ASA  Grade Assessment: III - A patient with                            severe systemic disease. After reviewing the risks                            and benefits, the patient was deemed in                            satisfactory condition to undergo the procedure.                           - Sedation was administered by an anesthesia                            professional. Deep sedation was  attained.                           After obtaining informed consent, the colonoscope                            was passed under direct vision. Throughout the                            procedure, the patient's blood pressure, pulse, and                            oxygen saturations were monitored continuously. The                            [id number] Olympus pediatric colonscope                            was introduced through the anus and advanced to the                            the terminal ileum. The colonoscopy was performed                            without difficulty. The patient tolerated the                            procedure well. The quality of the bowel                            preparation was good. The terminal ileum was                            photographed. Scope In: 9:21:30 AM Scope Out: 9:48:57 AM Scope Withdrawal Time: 0 hours 24 minutes 18 seconds  Total Procedure Duration: 0 hours 27 minutes 27 seconds  Findings:      The terminal ileum appeared normal. Biopsies were taken with a cold       forceps for histology.      Three sessile polyps  were found in the transverse colon and hepatic       flexure. The polyps were 3 to 7 mm in size. These polyps were removed       with a cold snare. Resection and retrieval were complete.      Normal mucosa was found in the entire colon. Biopsies for histology were       taken with a cold forceps from the entire colon for evaluation of       microscopic colitis.      Scattered small and large-mouthed diverticula were found in the sigmoid       colon. Impression:               - The examined portion of the ileum was normal.                            Biopsied.                           - Three 3 to 7 mm polyps in the transverse colon                            and at the hepatic flexure, removed with a cold                            snare. Resected and retrieved.                           - Normal mucosa in  the entire examined colon.                            Biopsied.                           - Diverticulosis in the sigmoid colon. Moderate Sedation:      Not Applicable - Patient had care per Anesthesia. Recommendation:           - Patient has a contact number available for                            emergencies. The signs and symptoms of potential                            delayed complications were discussed with the                            patient. Return to normal activities tomorrow.                            Written discharge instructions were provided to the                            patient.                           - Resume previous diet.                           -  Continue present medications.                           - Await pathology results.                           - Repeat colonoscopy in 4 years for surveillance.                           - Return to GI clinic in 4 weeks. Procedure Code(s):        --- Professional ---                           [phone], Colonoscopy, flexible; with removal of                            tumor(s), polyp(s), or other lesion(s) by snare                            technique                           [id number], 74, Colonoscopy, flexible; with biopsy,                            single or multiple Diagnosis Code(s):        --- Professional ---                           K63.5, Polyp of colon                           K52.9, Noninfective gastroenteritis and colitis,                            unspecified                           K57.30, Diverticulosis of large intestine without                            perforation or abscess without bleeding                           R93.3, Abnormal findings on diagnostic imaging of                            other parts of digestive tract CPT copyright 2019 American Medical Association. All rights reserved. The codes documented in this report are preliminary and upon coder review may  be revised to meet  current compliance requirements. Carol Ada, MD Carol Ada, MD 07/23/2019 9:55:28 AM This report has been signed electronically. Number of Addenda: 0

## 2019-07-23 NOTE — Anesthesia Preprocedure Evaluation (Addendum)
Anesthesia Evaluation  Patient identified by MRN, date of birth, ID band Patient awake    Reviewed: Allergy & Precautions, NPO status , Patient's Chart, lab work & pertinent test results  Airway Mallampati: I  TM Distance: >3 FB Neck ROM: Full    Dental  (+) Teeth Intact, Dental Advisory Given   Pulmonary asthma ,    breath sounds clear to auscultation       Cardiovascular + dysrhythmias  Rhythm:Regular Rate:Bradycardia     Neuro/Psych negative neurological ROS  negative psych ROS   GI/Hepatic negative GI ROS, Neg liver ROS,   Endo/Other  negative endocrine ROS  Renal/GU negative Renal ROS     Musculoskeletal  (+) Arthritis ,   Abdominal Normal abdominal exam  (+)   Peds  Hematology negative hematology ROS (+)   Anesthesia Other Findings   Reproductive/Obstetrics                            Anesthesia Physical Anesthesia Plan  ASA: II  Anesthesia Plan: MAC   Post-op Pain Management:    Induction: Intravenous  PONV Risk Score and Plan: 0 and Propofol infusion  Airway Management Planned: Natural Airway and Simple Face Mask  Additional Equipment: None  Intra-op Plan:   Post-operative Plan:   Informed Consent: I have reviewed the patients History and Physical, chart, labs and discussed the procedure including the risks, benefits and alternatives for the proposed anesthesia with the patient or authorized representative who has indicated his/her understanding and acceptance.       Plan Discussed with: CRNA  Anesthesia Plan Comments:        Anesthesia Quick Evaluation

## 2019-07-23 NOTE — Discharge Instructions (Signed)

## 2019-07-23 NOTE — Anesthesia Procedure Notes (Signed)
Procedure Name: MAC Date/Time: 07/23/2019 9:17 AM Performed by: Deliah Boston, CRNA Pre-anesthesia Checklist: Patient identified, Emergency Drugs available, Suction available and Patient being monitored Patient Re-evaluated:Patient Re-evaluated prior to induction Oxygen Delivery Method: Simple face mask Preoxygenation: Pre-oxygenation with 100% oxygen Placement Confirmation: positive ETCO2 and breath sounds checked- equal and bilateral

## 2019-07-26 ENCOUNTER — Encounter (HOSPITAL_COMMUNITY): Payer: Self-pay | Admitting: Gastroenterology

## 2019-08-02 LAB — SURGICAL PATHOLOGY

## 2019-09-03 HISTORY — PX: CARPAL TUNNEL RELEASE: SHX101

## 2019-09-10 DIAGNOSIS — H2511 Age-related nuclear cataract, right eye: Secondary | ICD-10-CM | POA: Diagnosis not present

## 2019-09-10 DIAGNOSIS — H1789 Other corneal scars and opacities: Secondary | ICD-10-CM | POA: Diagnosis not present

## 2019-09-10 DIAGNOSIS — H5201 Hypermetropia, right eye: Secondary | ICD-10-CM | POA: Diagnosis not present

## 2019-09-10 DIAGNOSIS — Z9842 Cataract extraction status, left eye: Secondary | ICD-10-CM | POA: Diagnosis not present

## 2019-09-10 DIAGNOSIS — Z961 Presence of intraocular lens: Secondary | ICD-10-CM | POA: Diagnosis not present

## 2019-09-10 DIAGNOSIS — H5212 Myopia, left eye: Secondary | ICD-10-CM | POA: Diagnosis not present

## 2019-09-10 DIAGNOSIS — H52221 Regular astigmatism, right eye: Secondary | ICD-10-CM | POA: Diagnosis not present

## 2019-09-20 ENCOUNTER — Encounter: Payer: Self-pay | Admitting: Cardiology

## 2019-09-20 DIAGNOSIS — R197 Diarrhea, unspecified: Secondary | ICD-10-CM | POA: Diagnosis not present

## 2019-09-22 ENCOUNTER — Ambulatory Visit: Payer: Medicare Other | Attending: Internal Medicine

## 2019-09-22 DIAGNOSIS — Z23 Encounter for immunization: Secondary | ICD-10-CM | POA: Insufficient documentation

## 2019-09-22 NOTE — Progress Notes (Signed)
   Covid-19 Vaccination Clinic  Name:  David [name]    MRN: [id number] DOB: 10-12-1945  09/22/2019  Mr. David [name] was observed post Covid-19 immunization for 15 minutes without incidence. He was provided with Vaccine Information Sheet and instruction to access the V-Safe system.   Mr. David [name] was instructed to call 911 with any severe reactions post vaccine: Marland Kitchen Difficulty breathing  . Swelling of your face and throat  . A fast heartbeat  . A bad rash all over your body  . Dizziness and weakness    Immunizations Administered    Name Date Dose VIS Date Route   Pfizer COVID-19 Vaccine 09/22/2019 12:56 PM 0.3 mL 08/13/2019 Intramuscular   Manufacturer: [place]   Lot: [id number]   [place]: [id number]

## 2019-09-29 ENCOUNTER — Ambulatory Visit (INDEPENDENT_AMBULATORY_CARE_PROVIDER_SITE_OTHER): Payer: Medicare Other | Admitting: Critical Care Medicine

## 2019-09-29 ENCOUNTER — Encounter: Payer: Self-pay | Admitting: Critical Care Medicine

## 2019-09-29 ENCOUNTER — Other Ambulatory Visit: Payer: Self-pay

## 2019-09-29 VITALS — BP 144/80 | HR 57 | Temp 98.0°F | Ht 68.0 in | Wt 167.6 lb

## 2019-09-29 DIAGNOSIS — J4540 Moderate persistent asthma, uncomplicated: Secondary | ICD-10-CM | POA: Diagnosis not present

## 2019-09-29 LAB — CBC WITH DIFFERENTIAL/PLATELET
Basophils Absolute: 0 10*3/uL (ref 0.0–0.1)
Basophils Relative: 0.9 % (ref 0.0–3.0)
Eosinophils Absolute: 0.6 10*3/uL (ref 0.0–0.7)
Eosinophils Relative: 10.1 % — ABNORMAL HIGH (ref 0.0–5.0)
HCT: 48.1 % (ref 39.0–52.0)
Hemoglobin: 15.9 g/dL (ref 13.0–17.0)
Lymphocytes Relative: 26.6 % (ref 12.0–46.0)
Lymphs Abs: 1.5 10*3/uL (ref 0.7–4.0)
MCHC: 33.1 g/dL (ref 30.0–36.0)
MCV: 96.7 fl (ref 78.0–100.0)
Monocytes Absolute: 0.4 10*3/uL (ref 0.1–1.0)
Monocytes Relative: 6.6 % (ref 3.0–12.0)
Neutro Abs: 3.2 10*3/uL (ref 1.4–7.7)
Neutrophils Relative %: 55.8 % (ref 43.0–77.0)
Platelets: 179 10*3/uL (ref 150.0–400.0)
RBC: 4.98 Mil/uL (ref 4.22–5.81)
RDW: 13.5 % (ref 11.5–15.5)
WBC: 5.7 10*3/uL (ref 4.0–10.5)

## 2019-09-29 MED ORDER — ALBUTEROL SULFATE HFA 108 (90 BASE) MCG/ACT IN AERS: 2.0000 | INHALATION_SPRAY | Freq: Four times a day (QID) | RESPIRATORY_TRACT | 1 refills | Status: DC | PRN

## 2019-09-29 MED ORDER — SPIRIVA RESPIMAT 2.5 MCG/ACT IN AERS: 1.0000 | INHALATION_SPRAY | Freq: Every day | RESPIRATORY_TRACT | 1 refills | Status: DC

## 2019-09-29 NOTE — Progress Notes (Signed)
Synopsis: Referred in January 2021 for asthma by Wayland Salinas, *.  Subjective:   PATIENT ID: David [name] GENDER: male DOB: November 17, 1945, MRN: [id number]  Chief Complaint  Patient presents with  . Consult    Patient is here to establish care for possible asthma. Patient states that he has wheezing all the time. Patient currently takes Symbicort. Patient states that he works out 3 days a week and is active. Patient had ablation in [place]. Patient also had Covid in August    David [name] is a 73 year old gentleman referred for evaluation of chronic asthma and wheezing.  He began having wheezing with activity in his 6s, but never had childhood asthma.  He was originally on Advair, but switched to Symbicort to take part in a clinical trial.  Doing peak flow meter testing during the clinical trial he had bronchodilator reversibility, and continued on Symbicort.  He has no childhood history of asthma and has minimal allergies to mold.  His wheezing does not significantly limit his activity; he continues to exercise 3 days/week and is very active.  He has chronic mucus production in the morning and later in the day, which he describes as thick and solid, yellow, and sometimes difficult to clear.  There is no previous family history of lung disease or eczema.  He denies allergic rhinitis, postnasal drip, rashes.  They have 1 pet dog, which is a nonshedding breed.  He never uses albuterol does not have an inhaler.  He tried Singulair in the past without benefit.  He never has nighttime symptoms.  He had Covid 19 in August, but not significantly ill nor did he have persistent symptoms.  He has received his first dose of his Covid vaccine.  He was never a regular smoker.  He has a history of nasal polyps requiring surgery in the past, but has no issues taking aspirin or NSAIDs.  No fever, chills, sweats, weight loss.     Lab Results  Component Value Date   NITRICOXIDE 66 05/29/2016    Past  Medical History:  Diagnosis Date  . Altitude sickness   . Asthma    exercise-induced; prn inhaler  . AV block, 2nd degree   . Bilateral inguinal hernia (BIH) 10/16/2012   Overview:  [facility] -Dr. Brantley Stage.  Observation for now 10/2012  . Complete heart block ([place]) 04/04/2018  . COVID-19   . Insomnia 07/14/2014  . Mobitz type 1 second degree atrioventricular block   . Nasal polyps   . OA (osteoarthritis) 09/24/2012  . 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 Reactive airway disease 09/24/2012  . Rotator cuff tear, right    impingement, AC arthrosis     Family History  Problem Relation Age of Onset  . Heart Problems Mother        had pacemaker, lived to 52  . Alzheimer's disease Father   . Prostate cancer Father      Past Surgical History:  Procedure Laterality Date  . A-FLUTTER ABLATION N/A 02/24/2019   Procedure: A-FLUTTER ABLATION;  Surgeon: Constance Haw, MD;  Location: [facility];  Service: Cardiovascular;  Laterality: N/A;  . BIOPSY  07/23/2019   Procedure: BIOPSY;  Surgeon: Carol Ada, MD;  [facility];  Service: Endoscopy;;  . COLONOSCOPY WITH PROPOFOL N/A 07/23/2019   Procedure: COLONOSCOPY WITH PROPOFOL;  Surgeon: Carol Ada, MD;  [facility];  Service: Endoscopy;  Laterality: N/A;  . LEFT HEART CATH AND CORONARY ANGIOGRAPHY N/A 04/06/2018  Procedure: LEFT HEART CATH AND CORONARY ANGIOGRAPHY;  Surgeon: Jettie Booze, MD;  Location: [facility];  Service: Cardiovascular;  Laterality: N/A;  . LUMBAR LAMINECTOMY  age 57s  . NASAL SINUS SURGERY    . POLYPECTOMY  07/23/2019   Procedure: POLYPECTOMY;  Surgeon: Carol Ada, MD;  [facility];  Service: Endoscopy;;  . REFRACTIVE SURGERY    . SHOULDER ARTHROSCOPY  08/06/2011   Procedure: ARTHROSCOPY SHOULDER;  Surgeon: Cammie Sickle., MD;  Location: [facility];  Service: Orthopedics;  Laterality: Right;  with subacromial  decompression, arthroscopic subscapularis repair, and rotator cuff repair  . TOTAL KNEE ARTHROPLASTY Right 08/10/2018   Procedure: TOTAL KNEE ARTHROPLASTY;  Surgeon: Frederik Pear, MD;  [facility];  Service: Orthopedics;  Laterality: Right;  . TRIGGER FINGER RELEASE Right 06/30/2014   Procedure: RELEASE A-1 PULLEY RIGHT THUMB;  Surgeon: Daryll Brod, MD;  Location: [facility];  Service: Orthopedics;  Laterality: Right;  ANESTHESIA:  IV REGIONAL FAB    Social History   Socioeconomic History  . Marital status: Married    Spouse name: Not on file  . Number of children: Not on file  . Years of education: Not on file  . Highest education level: Not on file  Occupational History  . Not on file  Tobacco Use  . Smoking status: Never Smoker  . Smokeless tobacco: Never Used  Substance and Sexual Activity  . Alcohol use: Yes    Comment: occ. beer  . Drug use: No  . Sexual activity: Not on file  Other Topics Concern  . Not on file  Social History Narrative  . Not on file   Social Determinants of Health   Financial Resource Strain:   . Difficulty of Paying Living Expenses: Not on file  Food Insecurity:   . Worried About Charity fundraiser in the Last Year: Not on file  . Ran Out of Food in the Last Year: Not on file  Transportation Needs:   . Lack of Transportation (Medical): Not on file  . Lack of Transportation (Non-Medical): Not on file  Physical Activity:   . Days of Exercise per Week: Not on file  . Minutes of Exercise per Session: Not on file  Stress:   . Feeling of Stress : Not on file  Social Connections:   . Frequency of Communication with Friends and Family: Not on file  . Frequency of Social Gatherings with Friends and Family: Not on file  . Attends Religious Services: Not on file  . Active Member of Clubs or Organizations: Not on file  . Attends Archivist Meetings: Not on file  . Marital Status: Not on file  Intimate Partner Violence:     . Fear of Current or Ex-Partner: Not on file  . Emotionally Abused: Not on file  . Physically Abused: Not on file  . Sexually Abused: Not on file     Allergies  Allergen Reactions  . Codeine Itching     Immunization History  Administered Date(s) Administered  . DTaP 04/06/1996  . Influenza Split 08/02/2012, 07/05/2013  . Influenza Whole 07/03/2016  . Influenza-Unspecified 06/16/2019  . PFIZER SARS-COV-2 Vaccination 09/22/2019  . Pneumococcal Conjugate-13 08/31/2008, 05/23/2016  . Pneumococcal Polysaccharide-23 11/02/2013  . Tdap 09/25/2007, 08/31/2008  . Zoster 08/31/2008    Outpatient Medications Prior to Visit  Medication Sig Dispense Refill  . budesonide-formoterol (SYMBICORT) 160-4.5 MCG/ACT inhaler Inhale 1 puff into the lungs 2 (two) times  daily as needed (ASTHMA).     . multivitamin (THERAGRAN) per tablet Take 1 tablet by mouth daily.       No facility-administered medications prior to visit.    Review of Systems  Constitutional: Negative for chills, fever and weight loss.  HENT: Negative for congestion.   Respiratory: Positive for cough, sputum production and wheezing.   Cardiovascular: Negative for chest pain.  Endo/Heme/Allergies: Positive for environmental allergies.     Objective:   Vitals:   09/29/19 1340  BP: (!) 144/80  Pulse: (!) 57  Temp: 98 F (36.7 C)  TempSrc: Temporal  SpO2: 96%  Weight: 167 lb 9.6 oz (76 kg)  Height: 5\' 8"  (1.727 m)   96% on  RA BMI Readings from Last 3 Encounters:  09/29/19 25.48 kg/m  07/23/19 24.33 kg/m  03/29/19 25.27 kg/m   Wt Readings from Last 3 Encounters:  09/29/19 167 lb 9.6 oz (76 kg)  07/23/19 160 lb (72.6 kg)  03/29/19 166 lb 3.2 oz (75.4 kg)    Physical Exam Constitutional:      Comments: Healthy-appearing man in no acute distress  HENT:     Head: Normocephalic and atraumatic.     Nose:     Comments: Deferred due to masking requirement.    Mouth/Throat:     Comments: Deferred due to  masking requirement. Eyes:     General: No scleral icterus. Cardiovascular:     Rate and Rhythm: Regular rhythm.     Heart sounds: No murmur.     Comments: Bradycardic Pulmonary:     Comments: Diffuse inspiratory and expiratory wheezing bilaterally, no conversational dyspnea or tachypnea. Abdominal:     General: There is no distension.     Palpations: Abdomen is soft.     Tenderness: There is no abdominal tenderness.  Musculoskeletal:        General: No swelling or deformity.     Cervical back: Neck supple.  Lymphadenopathy:     Cervical: No cervical adenopathy.  Skin:    General: Skin is warm and dry.     Findings: No rash.  Neurological:     General: No focal deficit present.     Mental Status: He is alert.     Motor: No weakness.     Coordination: Coordination normal.  Psychiatric:        Mood and Affect: Mood normal.        Behavior: Behavior normal.      CBC    Component Value Date/Time   WBC 5.7 09/29/2019 1427   RBC 4.98 09/29/2019 1427   HGB 15.9 09/29/2019 1427   HGB 14.6 11/06/2018 1257   HCT 48.1 09/29/2019 1427   HCT 45.9 11/06/2018 1257   PLT 179.0 09/29/2019 1427   PLT 201 11/06/2018 1257   MCV 96.7 09/29/2019 1427   MCV 95 11/06/2018 1257   MCH 31.5 02/24/2019 0718   MCHC 33.1 09/29/2019 1427   RDW 13.5 09/29/2019 1427   RDW 13.0 11/06/2018 1257   LYMPHSABS 1.5 09/29/2019 1427   MONOABS 0.4 09/29/2019 1427   EOSABS 0.6 09/29/2019 1427   BASOSABS 0.0 09/29/2019 1427    CHEMISTRY No results for input(s): NA, K, CL, CO2, GLUCOSE, BUN, CREATININE, CALCIUM, MG, PHOS in the last 168 hours. CrCl cannot be calculated (Patient's most recent lab result is older than the maximum 21 days allowed.).   Chest Imaging- films reviewed: CT abdomen pelvis 12/06/2018, lung images reviewed-limited due to respiratory motion, mild right retrocardiac linear opacity  CTA chest 04/05/2018- Mild bibasilar dependent opacities.  No significant mediastinal or hilar  adenopathy.  No PE.  No bronchiectasis or concern for chronic pulmonary infection.  Pulmonary Functions Testing Results: No flowsheet data found.    Echocardiogram 04/05/2018: LVEF 55 to 60%, no regional wall motion abnormalities.  Normal LA, mildly dilated RV, normal RA.  Normal PASP.  Trivial TR, otherwise normal valves.  Heart Catheterization 2019: EF 55 to 65%, normal LV function and normal LVEDP.  No aortic stenosis, no significant coronary artery disease.    Assessment & Plan:     ICD-10-CM   1. Moderate persistent asthma without complication  123456 Pulmonary function test    IgE    CBC w/Diff    CBC w/Diff    IgE    Moderate persistent asthma-elevated eosinophils.  Uncontrolled symptoms. -PFTs -Continue Symbicort twice daily -Adding Spiriva once daily. -Albuterol as needed -Checking CBC with differential and IgE.  He may require Biologics to fully control his symptoms. -Agree with completing his course of his Covid vaccine -Continue Covid precautions-social distancing, mask wearing, handwashing. -Up-to-date on seasonal flu and pneumonia vaccines   Current Outpatient Medications:  .  budesonide-formoterol (SYMBICORT) 160-4.5 MCG/ACT inhaler, Inhale 1 puff into the lungs 2 (two) times daily as needed (ASTHMA). , Disp: , Rfl:  .  albuterol (VENTOLIN HFA) 108 (90 Base) MCG/ACT inhaler, Inhale 2 puffs into the lungs every 6 (six) hours as needed for wheezing or shortness of breath., Disp: 8 g, Rfl: 1 .  Tiotropium Bromide Monohydrate (SPIRIVA RESPIMAT) 2.5 MCG/ACT AERS, Inhale 1 puff into the lungs daily., Disp: 4 g, Rfl: 1    Julian Hy, DO [facility] 09/29/2019 8:33 PM

## 2019-09-29 NOTE — Patient Instructions (Addendum)
Thank you for visiting Dr. Carlis Abbott at [facility]. We recommend the following: Orders Placed This Encounter  Procedures  . IgE  . CBC w/Diff  . Pulmonary function test   Orders Placed This Encounter  Procedures  . IgE    Standing Status:   Future    Standing Expiration Date:   09/28/2020  . CBC w/Diff    Standing Status:   Future    Standing Expiration Date:   09/28/2020  . Pulmonary function test    Standing Status:   Future    Standing Expiration Date:   09/28/2020    Order Specific Question:   Where should this test be performed?    Answer:   [facility]    Order Specific Question:   Full PFT: includes the following: basic spirometry, spirometry pre & post bronchodilator, diffusion capacity (DLCO), lung volumes    Answer:   Full PFT    Meds ordered this encounter  Medications  . Tiotropium Bromide Monohydrate (SPIRIVA RESPIMAT) 2.5 MCG/ACT AERS    Sig: Inhale 1 puff into the lungs daily.    Dispense:  4 g    Refill:  1  . albuterol (VENTOLIN HFA) 108 (90 Base) MCG/ACT inhaler    Sig: Inhale 2 puffs into the lungs every 6 (six) hours as needed for wheezing or shortness of breath.    Dispense:  8 g    Refill:  1    Return in about 1 month (around 10/30/2019).    Please do your part to reduce the spread of COVID-19.

## 2019-09-30 LAB — IGE: IgE (Immunoglobulin E), Serum: 137 kU/L — ABNORMAL HIGH (ref <=114)

## 2019-10-01 ENCOUNTER — Telehealth: Payer: Self-pay | Admitting: Critical Care Medicine

## 2019-10-01 NOTE — Telephone Encounter (Signed)
PA request received from [facility]  Drug requested: Spiriva Respimat 2.5 CMM Key: [id number] Tried/failed: Albuterol HFA, Symbicort, Dulera 100 Covered alternatives:  PA request has been sent to plan, and a determination is expected within 1-3 days.   Routing to [facility] for follow-up.

## 2019-10-04 ENCOUNTER — Ambulatory Visit: Payer: Medicare Other

## 2019-10-05 ENCOUNTER — Other Ambulatory Visit: Payer: Self-pay | Admitting: Critical Care Medicine

## 2019-10-05 DIAGNOSIS — R197 Diarrhea, unspecified: Secondary | ICD-10-CM

## 2019-10-05 NOTE — Progress Notes (Signed)
Stool O&P and strongyloides Ab ordered for history or elevated eosinophils with chronic loose stools/ diarrhea.  Julian Hy, DO 10/05/19 7:14 AM [facility]

## 2019-10-05 NOTE — Addendum Note (Signed)
Addended by: Lia Foyer R on: 10/05/2019 05:03 PM   Modules accepted: Orders

## 2019-10-06 ENCOUNTER — Other Ambulatory Visit: Payer: Medicare Other

## 2019-10-06 DIAGNOSIS — R197 Diarrhea, unspecified: Secondary | ICD-10-CM | POA: Diagnosis not present

## 2019-10-06 NOTE — Addendum Note (Signed)
Addended by: Lia Foyer R on: 10/06/2019 12:29 PM   Modules accepted: Orders

## 2019-10-06 NOTE — Addendum Note (Signed)
Addended by: Lia Foyer R on: 10/06/2019 12:21 PM   Modules accepted: Orders

## 2019-10-08 LAB — STRONGYLOIDES, AB, IGG: Strongyloides, Ab, IgG: NEGATIVE

## 2019-10-08 NOTE — Telephone Encounter (Signed)
Medication name and strength: Spiriva Respimat 2.51mcg PA approved/denied: Approved Approval dates: 09/29/2019-09/01/2038 RX [id number] Ref [id number]  Pharmacy called and notified that PA was approved.

## 2019-10-11 ENCOUNTER — Other Ambulatory Visit: Payer: Self-pay

## 2019-10-11 ENCOUNTER — Encounter: Payer: Self-pay | Admitting: Cardiology

## 2019-10-11 ENCOUNTER — Ambulatory Visit (INDEPENDENT_AMBULATORY_CARE_PROVIDER_SITE_OTHER): Payer: Medicare Other | Admitting: Cardiology

## 2019-10-11 VITALS — BP 146/86 | HR 49 | Ht 68.0 in | Wt 173.0 lb

## 2019-10-11 DIAGNOSIS — I441 Atrioventricular block, second degree: Secondary | ICD-10-CM

## 2019-10-11 NOTE — Progress Notes (Signed)
Electrophysiology Office Note   Date:  10/11/2019   ID:  David [name], DOB 09/02/1946, MRN [id number]  PCP:  Wayland Salinas, MD  Cardiologist:  Marlou Porch Primary Electrophysiologist:  Valor [place] Meredith Leeds, MD    No chief complaint on file.    History of Present Illness: David [name] is a 74 y.o. male who is being seen today for the evaluation of bradycardia at the request of Ryter-Brown, Shyrl Numbers, *. Presenting today for electrophysiology evaluation.  He initially presented to the hospital with weight gain and lower extremity edema.  Echo showed a normal ejection fraction and left heart catheterization showed no evidence of coronary disease.  He was found to have Mobitz 1 and 2-1 AV block while in the hospital.  He wore a Holter monitor that showed similar results.  He was also noted to be in atrial flutter and has since had an atrial flutter ablation.  Today, denies symptoms of palpitations, chest pain, shortness of breath, orthopnea, PND, lower extremity edema, claudication, dizziness, presyncope, syncope, bleeding, or neurologic sequela. The patient is tolerating medications without difficulties.  Overall he is doing well from a cardiac perspective.  He is able to exert himself without issue.  He continues to have slow heart rates at times.  He is also having issues with asthma and diarrhea which is currently being worked up.  He is able to go to the gym and gets his heart rates still into the 120s without issue.   Past Medical History:  Diagnosis Date  . Altitude sickness   . Asthma    exercise-induced; prn inhaler  . AV block, 2nd degree   . Bilateral inguinal hernia (BIH) 10/16/2012   Overview:  [facility] -Dr. Brantley Stage.  Observation for now 10/2012  . Complete heart block ([place]) 04/04/2018  . COVID-19   . Insomnia 07/14/2014  . Mobitz type 1 second degree atrioventricular block   . Nasal polyps   . OA (osteoarthritis) 09/24/2012  . Osteoarthritis of right knee  08/06/2018  . Reactive airway disease 09/24/2012  . Rotator cuff tear, right    impingement, AC arthrosis   Past Surgical History:  Procedure Laterality Date  . A-FLUTTER ABLATION N/A 02/24/2019   Procedure: A-FLUTTER ABLATION;  Surgeon: Constance Haw, MD;  Location: [facility];  Service: Cardiovascular;  Laterality: N/A;  . BIOPSY  07/23/2019   Procedure: BIOPSY;  Surgeon: Carol Ada, MD;  [facility];  Service: Endoscopy;;  . COLONOSCOPY WITH PROPOFOL N/A 07/23/2019   Procedure: COLONOSCOPY WITH PROPOFOL;  Surgeon: Carol Ada, MD;  [facility];  Service: Endoscopy;  Laterality: N/A;  . LEFT HEART CATH AND CORONARY ANGIOGRAPHY N/A 04/06/2018   Procedure: LEFT HEART CATH AND CORONARY ANGIOGRAPHY;  Surgeon: Jettie Booze, MD;  Location: [facility];  Service: Cardiovascular;  Laterality: N/A;  . LUMBAR LAMINECTOMY  age 60s  . NASAL SINUS SURGERY    . POLYPECTOMY  07/23/2019   Procedure: POLYPECTOMY;  Surgeon: Carol Ada, MD;  [facility];  Service: Endoscopy;;  . REFRACTIVE SURGERY    . SHOULDER ARTHROSCOPY  08/06/2011   Procedure: ARTHROSCOPY SHOULDER;  Surgeon: Cammie Sickle., MD;  Location: [facility];  Service: Orthopedics;  Laterality: Right;  with subacromial decompression, arthroscopic subscapularis repair, and rotator cuff repair  . TOTAL KNEE ARTHROPLASTY Right 08/10/2018   Procedure: TOTAL KNEE ARTHROPLASTY;  Surgeon: Frederik Pear, MD;  [facility];  Service: Orthopedics;  Laterality: Right;  .  TRIGGER FINGER RELEASE Right 06/30/2014   Procedure: RELEASE A-1 PULLEY RIGHT THUMB;  Surgeon: Daryll Brod, MD;  Location: [facility];  Service: Orthopedics;  Laterality: Right;  ANESTHESIA:  IV REGIONAL FAB     Current Outpatient Medications  Medication Sig Dispense Refill  . albuterol (VENTOLIN HFA) 108 (90 Base) MCG/ACT inhaler Inhale 2 puffs into the lungs every 6 (six) hours as  needed for wheezing or shortness of breath. 8 g 1  . budesonide-formoterol (SYMBICORT) 160-4.5 MCG/ACT inhaler Inhale 1 puff into the lungs 2 (two) times daily as needed (ASTHMA).     . eszopiclone (LUNESTA) 2 MG TABS tablet Take 2 mg by mouth as needed.    . Tiotropium Bromide Monohydrate (SPIRIVA RESPIMAT) 2.5 MCG/ACT AERS Inhale 1 puff into the lungs daily. 4 g 1   No current facility-administered medications for this visit.    Allergies:   Codeine   Social History:  The patient  reports that he has never smoked. He has never used smokeless tobacco. He reports current alcohol use. He reports that he does not use drugs.   Family History:  The patient's family history includes Alzheimer's disease in his father; Heart Problems in his mother; Prostate cancer in his father.   ROS:  Please see the history of present illness.   Otherwise, review of systems is positive for none.   All other systems are reviewed and negative.   PHYSICAL EXAM: VS:  BP (!) 146/86   Pulse (!) 49   Ht 5\' 8"  (1.727 m)   Wt 173 lb (78.5 kg)   SpO2 95%   BMI 26.30 kg/m  , BMI Body mass index is 26.3 kg/m. GEN: Well nourished, well developed, in no acute distress  HEENT: normal  Neck: no JVD, carotid bruits, or masses Cardiac: RRR; no murmurs, rubs, or gallops,no edema  Respiratory:  clear to auscultation bilaterally, normal work of breathing GI: soft, nontender, nondistended, + BS MS: no deformity or atrophy  Skin: warm and dry Neuro:  Strength and sensation are intact Psych: euthymic mood, full affect  EKG:  EKG is ordered today. Personal review of the ekg ordered shows sinus rhythm, Mobitz 1 AV block, rate 49  Recent Labs: 12/06/2018: ALT 21 02/24/2019: BUN 33; Creatinine, Ser 1.37; Potassium 4.6; Sodium 140 09/29/2019: Hemoglobin 15.9; Platelets 179.0    Lipid Panel     Component Value Date/Time   CHOL 141 04/05/2018 0019   TRIG 27 04/05/2018 0019   HDL 37 (L) 04/05/2018 0019   CHOLHDL 3.8  04/05/2018 0019   VLDL 5 04/05/2018 0019   LDLCALC 99 04/05/2018 0019     Wt Readings from Last 3 Encounters:  10/11/19 173 lb (78.5 kg)  09/29/19 167 lb 9.6 oz (76 kg)  07/23/19 160 lb (72.6 kg)      Other studies Reviewed: Additional studies/ records that were reviewed today include: LHC 04/06/18  Review of the above records today demonstrates:   The left ventricular systolic function is normal.  LV end diastolic pressure is normal.  The left ventricular ejection fraction is 55-65% by visual estimate.  There is no aortic valve stenosis.  No significant CAD.  TTE 04/05/18 - Left ventricle: The cavity size was normal. Wall thickness was   normal. Systolic function was normal. The estimated ejection   fraction was in the range of 55% to 60%. Wall motion was normal;   there were no regional wall motion abnormalities. - Aortic valve: Trileaflet; mildly thickened, mildly calcified  leaflets. - Right ventricle: The cavity size was mildly dilated. Wall   thickness was normal. - Tricuspid valve: There was trivial regurgitation.  Holter 04/20/18 - personally reviewed Minimum HR: 28 BPM at 7:36:27 AM(2) Maximum HR: 111 BPM at 9:36:54 AM(2) Average HR: 53 BPM 1% PVCs Sinus rhythm with mobitz I block, 2:1 AV block, and complete heart block during waking hours with narrow complex QRS  ASSESSMENT AND PLAN:  1.  2-1 second-degree AV block: QRS complex is narrow.  Able to get his heart rate into the 120s.  Would prefer to avoid pacemaker implant.  He is in Mobitz 1 AV block today.  No changes.  2.  Typical appearing atrial flutter: Currently on Eliquis.  Status post ablation 02/24/2019.  Currently off of anticoagulation for CHA2DS2-VASc of 1.     Current medicines are reviewed at length with the patient today.   The patient does not have concerns regarding his medicines.  The following changes were made today: None  Labs/ tests ordered today include:  Orders Placed This Encounter   Procedures  . EKG 12-Lead    Disposition:   FU with Eaven Schwager 12 months  Signed, Gilford Lardizabal Meredith Leeds, MD  10/11/2019 4:12 PM     [address] [place] [address] [phone] (office) [phone] (fax)

## 2019-10-12 LAB — OVA AND PARASITE EXAMINATION
CONCENTRATE RESULT:: NONE SEEN
MICRO NUMBER:: 10111678
SPECIMEN QUALITY:: ADEQUATE
TRICHROME RESULT:: NONE SEEN

## 2019-10-13 ENCOUNTER — Ambulatory Visit: Payer: Medicare Other | Attending: Internal Medicine

## 2019-10-13 DIAGNOSIS — Z23 Encounter for immunization: Secondary | ICD-10-CM | POA: Insufficient documentation

## 2019-10-13 DIAGNOSIS — H2511 Age-related nuclear cataract, right eye: Secondary | ICD-10-CM | POA: Diagnosis not present

## 2019-10-13 DIAGNOSIS — H5212 Myopia, left eye: Secondary | ICD-10-CM | POA: Diagnosis not present

## 2019-10-13 DIAGNOSIS — H52223 Regular astigmatism, bilateral: Secondary | ICD-10-CM | POA: Diagnosis not present

## 2019-10-13 DIAGNOSIS — H5201 Hypermetropia, right eye: Secondary | ICD-10-CM | POA: Diagnosis not present

## 2019-10-13 DIAGNOSIS — H26492 Other secondary cataract, left eye: Secondary | ICD-10-CM | POA: Diagnosis not present

## 2019-10-13 DIAGNOSIS — H524 Presbyopia: Secondary | ICD-10-CM | POA: Diagnosis not present

## 2019-10-13 NOTE — Progress Notes (Signed)
Please let David [name] know  that his studies show no parasites based on our evaluation so far. If he wants to look into this further or get a second opinion I recommend him following up with his PCP, but eosinophils this high can certainly be seen in allergic asthma and allergic conditions in general. He is right to look into the loose stools/ diarrhea with his PCP regardless.

## 2019-10-13 NOTE — Progress Notes (Signed)
   Covid-19 Vaccination Clinic  Name:  David [name]    MRN: [id number] DOB: December 06, 1945  10/13/2019  Mr. Root was observed post Covid-19 immunization for 15 minutes without incidence. He was provided with Vaccine Information Sheet and instruction to access the V-Safe system.   [name] was instructed to call 911 with any severe reactions post vaccine: Marland Kitchen Difficulty breathing  . Swelling of your face and throat  . A fast heartbeat  . A bad rash all over your body  . Dizziness and weakness    Immunizations Administered    Name Date Dose VIS Date Route   Pfizer COVID-19 Vaccine 10/13/2019 12:01 PM 0.3 mL 08/13/2019 Intramuscular   Manufacturer: [place]   Lot: [id number]   [place]: [id number]

## 2019-10-14 DIAGNOSIS — Z96651 Presence of right artificial knee joint: Secondary | ICD-10-CM | POA: Diagnosis not present

## 2019-10-14 DIAGNOSIS — M25561 Pain in right knee: Secondary | ICD-10-CM | POA: Diagnosis not present

## 2019-10-15 ENCOUNTER — Other Ambulatory Visit (HOSPITAL_COMMUNITY)
Admission: RE | Admit: 2019-10-15 | Discharge: 2019-10-15 | Disposition: A | Discharge status: Home / Self Care | Payer: Medicare Other | Source: Ambulatory Visit | Attending: Critical Care Medicine | Admitting: Critical Care Medicine

## 2019-10-15 DIAGNOSIS — Z01812 Encounter for preprocedural laboratory examination: Secondary | ICD-10-CM | POA: Diagnosis not present

## 2019-10-15 DIAGNOSIS — Z20822 Contact with and (suspected) exposure to covid-19: Secondary | ICD-10-CM | POA: Insufficient documentation

## 2019-10-15 LAB — SARS CORONAVIRUS 2 (TAT 6-24 HRS): SARS Coronavirus 2: NEGATIVE

## 2019-10-18 ENCOUNTER — Ambulatory Visit (INDEPENDENT_AMBULATORY_CARE_PROVIDER_SITE_OTHER): Payer: Medicare Other | Admitting: Critical Care Medicine

## 2019-10-18 ENCOUNTER — Other Ambulatory Visit: Payer: Self-pay

## 2019-10-18 DIAGNOSIS — J4540 Moderate persistent asthma, uncomplicated: Secondary | ICD-10-CM | POA: Diagnosis not present

## 2019-10-18 LAB — PULMONARY FUNCTION TEST
DL/VA % pred: 101 %
DL/VA: 4.13 ml/min/mmHg/L
DLCO unc % pred: 111 %
DLCO unc: 26.6 ml/min/mmHg
FEF 25-75 Post: 1.49 L/sec
FEF 25-75 Pre: 1.18 L/sec
FEF2575-%Change-Post: 25 %
FEF2575-%Pred-Post: 68 %
FEF2575-%Pred-Pre: 54 %
FEV1-%Change-Post: 8 %
FEV1-%Pred-Post: 84 %
FEV1-%Pred-Pre: 78 %
FEV1-Post: 2.46 L
FEV1-Pre: 2.27 L
FEV1FVC-%Change-Post: 5 %
FEV1FVC-%Pred-Pre: 85 %
FEV6-%Change-Post: 2 %
FEV6-%Pred-Post: 98 %
FEV6-%Pred-Pre: 96 %
FEV6-Post: 3.68 L
FEV6-Pre: 3.6 L
FEV6FVC-%Change-Post: 0 %
FEV6FVC-%Pred-Post: 105 %
FEV6FVC-%Pred-Pre: 105 %
FVC-%Change-Post: 2 %
FVC-%Pred-Post: 93 %
FVC-%Pred-Pre: 91 %
FVC-Post: 3.73 L
FVC-Pre: 3.65 L
Post FEV1/FVC ratio: 66 %
Post FEV6/FVC ratio: 99 %
Pre FEV1/FVC ratio: 62 %
Pre FEV6/FVC Ratio: 99 %
RV % pred: 128 %
RV: 3.03 L
TLC % pred: 104 %
TLC: 6.85 L

## 2019-10-18 NOTE — Progress Notes (Signed)
PFT done today. 

## 2019-10-20 DIAGNOSIS — H25011 Cortical age-related cataract, right eye: Secondary | ICD-10-CM | POA: Diagnosis not present

## 2019-10-20 DIAGNOSIS — H2511 Age-related nuclear cataract, right eye: Secondary | ICD-10-CM | POA: Diagnosis not present

## 2019-10-20 DIAGNOSIS — Z961 Presence of intraocular lens: Secondary | ICD-10-CM | POA: Diagnosis not present

## 2019-10-20 DIAGNOSIS — H26492 Other secondary cataract, left eye: Secondary | ICD-10-CM | POA: Diagnosis not present

## 2019-10-26 NOTE — Progress Notes (Signed)
He has moderate obstruction on his PFTs, but asthma severity is graded beyond just pulmonary function criteria.  A lot of it is based on how well controlled his symptoms are.  We would consider anyone who is needing the rescue inhaler more than 1-2 times a week someone who is not controlled.  The problem is that even people that have mild asthma can have severe exacerbations of put them in the hospital and cause life-threatening situations.  I would recommend restarting his Symbicort.  If you prefer to try a different inhaler such as Advair, Breo, or Dulera, that is fine as well.

## 2019-11-01 NOTE — Telephone Encounter (Signed)
Called pt, scheduled for 3/3 at 3:00 with Beth.  Nothing further needed at this time- will close encounter.

## 2019-11-02 DIAGNOSIS — H2511 Age-related nuclear cataract, right eye: Secondary | ICD-10-CM | POA: Diagnosis not present

## 2019-11-02 DIAGNOSIS — H18711 Corneal ectasia, right eye: Secondary | ICD-10-CM | POA: Diagnosis not present

## 2019-11-02 DIAGNOSIS — Z961 Presence of intraocular lens: Secondary | ICD-10-CM | POA: Diagnosis not present

## 2019-11-02 DIAGNOSIS — H52211 Irregular astigmatism, right eye: Secondary | ICD-10-CM | POA: Diagnosis not present

## 2019-11-03 ENCOUNTER — Telehealth: Payer: Self-pay | Admitting: Primary Care

## 2019-11-03 ENCOUNTER — Encounter: Payer: Self-pay | Admitting: Primary Care

## 2019-11-03 ENCOUNTER — Ambulatory Visit (INDEPENDENT_AMBULATORY_CARE_PROVIDER_SITE_OTHER): Payer: Medicare Other | Admitting: Primary Care

## 2019-11-03 ENCOUNTER — Other Ambulatory Visit: Payer: Self-pay

## 2019-11-03 ENCOUNTER — Encounter: Payer: Self-pay | Admitting: *Deleted

## 2019-11-03 DIAGNOSIS — J4551 Severe persistent asthma with (acute) exacerbation: Secondary | ICD-10-CM | POA: Diagnosis not present

## 2019-11-03 MED ORDER — MONTELUKAST SODIUM 10 MG PO TABS: 10.0000 mg | ORAL_TABLET | Freq: Every day | ORAL | 11 refills | Status: DC

## 2019-11-03 MED ORDER — BREO ELLIPTA 200-25 MCG/INH IN AEPB: 1.0000 | INHALATION_SPRAY | Freq: Every day | RESPIRATORY_TRACT | 0 refills | Status: DC

## 2019-11-03 NOTE — Patient Instructions (Addendum)
Rx: - Sending in David [name] 200 - if too expensive call us next week and ask if we have received any samples  - Singulair 10mg  at bedtime   Recommendations: - Continue Spiriva 2 puffs once daily in morning - Continue Albuterol 2 puffs every 6 hours as needed for wheezing or shortness of breath *We will start process for biologic (this may take a couple of weeks to get approved and will require paperwork to be completed)  Follow-up: - Televisit 2-4 weeks with Dr. Carlis Abbott or Eustaquio Maize NP     Asthma, Adult  Asthma is a long-term (chronic) condition that causes recurrent episodes in which the airways become tight and narrow. The airways are the passages that lead from the nose and mouth down into the lungs. Asthma episodes, also called asthma attacks, can cause coughing, wheezing, shortness of breath, and chest pain. The airways can also fill with mucus. During an attack, it can be difficult to breathe. Asthma attacks can range from minor to life threatening. Asthma cannot be cured, but medicines and lifestyle changes can help control it and treat acute attacks. What are the causes? This condition is believed to be caused by inherited (genetic) and environmental factors, but its exact cause is not known. There are many things that can bring on an asthma attack or make asthma symptoms worse (triggers). Asthma triggers are different for each person. Common triggers include:  Mold.  Dust.  Cigarette smoke.  Cockroaches.  Things that can cause allergy symptoms (allergens), such as animal dander or pollen from trees or grass.  Air pollutants such as household cleaners, wood smoke, smog, or [employer] account planner.  Cold air, weather changes, and winds (which increase molds and pollen in the air).  Strong emotional expressions such as crying or laughing hard.  Stress.  Certain medicines (such as aspirin) or types of medicines (such as beta-blockers).  Sulfites in foods and drinks. Foods and drinks that  may contain sulfites include dried fruit, potato chips, and sparkling grape juice.  Infections or inflammatory conditions such as the flu, a cold, or inflammation of the nasal membranes (rhinitis).  Gastroesophageal reflux disease (GERD).  Exercise or strenuous activity. What are the signs or symptoms? Symptoms of this condition may occur right after asthma is triggered or many hours later. Symptoms include:  Wheezing. This can sound like whistling when you breathe.  Excessive nighttime or early morning coughing.  Frequent or severe coughing with a common cold.  Chest tightness.  Shortness of breath.  Tiredness (fatigue) with minimal activity. How is this diagnosed? This condition is diagnosed based on:  Your medical history.  A physical exam.  Tests, which may include: ? Lung function studies and pulmonary studies (spirometry). These tests can evaluate the flow of air in your lungs. ? Allergy tests. ? Imaging tests, such as X-rays. How is this treated? There is no cure for this condition, but treatment can help control your symptoms. Treatment for asthma usually involves:  Identifying and avoiding your asthma triggers.  Using medicines to control your symptoms. Generally, two types of medicines are used to treat asthma: ? Controller medicines. These help prevent asthma symptoms from occurring. They are usually taken every day. ? Fast-acting reliever or rescue medicines. These quickly relieve asthma symptoms by widening the narrow and tight airways. They are used as needed and provide short-term relief.  Using supplemental oxygen. This may be needed during a severe episode.  Using other medicines, such as: ? Allergy medicines, such as antihistamines, if  your asthma attacks are triggered by allergens. ? Immune medicines (immunomodulators). These are medicines that help control the immune system.  Creating an asthma action plan. An asthma action plan is a written plan for  managing and treating your asthma attacks. This plan includes: ? A list of your asthma triggers and how to avoid them. ? Information about when medicines should be taken and when their dosage should be changed. ? Instructions about using a device called a peak flow meter. A peak flow meter measures how well the lungs are working and the severity of your asthma. It helps you monitor your condition. Follow these instructions at home: Controlling your home environment Control your home environment in the following ways to help avoid triggers and prevent asthma attacks:  Change your heating and air conditioning filter regularly.  Limit your use of fireplaces and wood stoves.  Get rid of pests (such as roaches and mice) and their droppings.  Throw away plants if you see mold on them.  Clean floors and dust surfaces regularly. Use unscented cleaning products.  Try to have someone else vacuum for you regularly. Stay out of rooms while they are being vacuumed and for a short while afterward. If you vacuum, use a dust mask from a hardware store, a double-layered or microfilter vacuum cleaner bag, or a vacuum cleaner with a HEPA filter.  Replace carpet with wood, tile, or vinyl flooring. Carpet can trap dander and dust.  Use allergy-proof pillows, mattress covers, and box spring covers.  Keep your bedroom a trigger-free room.  Avoid pets and keep windows closed when allergens are in the air.  Wash beddings every week in hot water and dry them in a dryer.  Use blankets that are made of polyester or cotton.  Clean bathrooms and kitchens with bleach. If possible, have someone repaint the walls in these rooms with mold-resistant paint. Stay out of the rooms that are being cleaned and painted.  Wash your hands often with soap and water. If soap and water are not available, use hand sanitizer.  Do not allow anyone to smoke in your home. General instructions  Take over-the-counter and  prescription medicines only as told by your health care provider. ? Speak with your health care provider if you have questions about how or when to take the medicines. ? Make note if you are requiring more frequent dosages.  Do not use any products that contain nicotine or tobacco, such as cigarettes and e-cigarettes. If you need help quitting, ask your health care provider. Also, avoid being exposed to secondhand smoke.  Use a peak flow meter as told by your health care provider. Record and keep track of the readings.  Understand and use the asthma action plan to help minimize, or stop an asthma attack, without needing to seek medical care.  Make sure you stay up to date on your yearly vaccinations as told by your health care provider. This may include vaccines for the flu and pneumonia.  Avoid outdoor activities when allergen counts are high and when air quality is low.  Wear a ski mask that covers your nose and mouth during outdoor winter activities. Exercise indoors on cold days if you can.  Warm up before exercising, and take time for a cool-down period after exercise.  Keep all follow-up visits as told by your health care provider. This is important. Where to find more information  For information about asthma, turn to the Centers for Disease Control and Prevention at http://www.clark.net/.htm  For air quality information, turn to [employer] at WeightRating.nl Contact a health care provider if:  You have wheezing, shortness of breath, or a cough even while you are taking medicine to prevent attacks.  The mucus you cough up (sputum) is thicker than usual.  Your sputum changes from clear or white to yellow, green, gray, or bloody.  Your medicines are causing side effects, such as a rash, itching, swelling, or trouble breathing.  You need to use a reliever medicine more than 2-3 times a week.  Your peak flow reading is still at 50-79% of your personal best after following  your action plan for 1 hour.  You have a fever. Get help right away if:  You are getting worse and do not respond to treatment during an asthma attack.  You are short of breath when at rest or when doing very little physical activity.  You have difficulty eating, drinking, or talking.  You have chest pain or tightness.  You develop a fast heartbeat or palpitations.  You have a bluish color to your lips or fingernails.  You are light-headed or dizzy, or you faint.  Your peak flow reading is less than 50% of your personal best.  You feel too tired to breathe normally. Summary  Asthma is a long-term (chronic) condition that causes recurrent episodes in which the airways become tight and narrow. These episodes can cause coughing, wheezing, shortness of breath, and chest pain.  Asthma cannot be cured, but medicines and lifestyle changes can help control it and treat acute attacks.  Make sure you understand how to avoid triggers and how and when to use your medicines.  Asthma attacks can range from minor to life threatening. Get help right away if you have an asthma attack and do not respond to treatment with your usual rescue medicines. This information is not intended to replace advice given to you by your health care provider. Make sure you discuss any questions you have with your health care provider. Document Revised: 10/22/2018 Document Reviewed: 09/23/2016 Elsevier Patient Education  2020 [employer].

## 2019-11-03 NOTE — Telephone Encounter (Signed)
Enrollment forms for Xolair have been filled out. Pt will have to come back to the office to sign his portion of the forms since these were not started before he left.   Spoke with pt. He did not want to have to come back to the office to sign his portion of the forms. Pt asked if he could fill them out online, I am going to send him a MyChart message with instructions on how to fill them out on the [facility] website. He is going to send me a MyChart message back once he has done his part and then I will fax in our portion of the forms along with his insurance cards.

## 2019-11-03 NOTE — Assessment & Plan Note (Addendum)
-   Reports persistent dyspnea, wheezing and cough. Requiring oral steroids 4+ times a year - PFTs confirm asthma with moderate obstruction - IgE 137; eos 600 - Trial Breo 200 one puff daily (failed Symbicort and Adviar in the past); Continue Spiriva two puffs once daily - Add Singulair 10mg  at bedtime  - Start process of getting approval for Xolair  - FU in 2-4 weeks

## 2019-11-03 NOTE — Progress Notes (Signed)
@Patient  ID: David [name], male    DOB: 14-Apr-1946, 74 y.o.   MRN: [id number]  Chief Complaint  Patient presents with  . Follow-up    Referring provider: Wayland Salinas, *  HPI: 74 year old male, never smoked. PMH significant for moderate persistent asthma, elevated eosinophils. Patient of Dr. Carlis Abbott, last seen on 09/29/19. Continue Symbicort twice daily, added spiriva. PFTs confirm asthma with moderate obstruction. IgE 137; eos 600.   11/03/2019 Patient presents today for acute visit. Reports ongoing dyspnea and wheezing, no improvement from addition of Spiriva. He is not currently taking Symbicort or any other ICS/LABA. He has tried Advair many years ago with no significant improvement. Compliant with taking spiriva every morning, using his Albuterol rescue inhaler three times a day or more. He required oral prednisone approximately 4 times in 2020. His symptoms do improve while on prednisone but he does not want to be on a steriod long term. Denies fever, chills, sweats, HA, chest tightness, chest pain, N/V/D.   Chest Imaging: CT abdomen pelvis 12/06/2018, lung images reviewed-limited due to respiratory motion, mild right retrocardiac linear opacity  CTA chest 04/05/2018- Mild bibasilar dependent opacities.  No significant mediastinal or hilar adenopathy.  No PE.  No bronchiectasis or concern for chronic pulmonary infection.  Pulmonary Function Testing: 10/18/19- pre bronch FEV1 2.27 (78%), ratio 62; post-bronch FEV1 2.46 (84%), ratio 66; no significant BD response, patient did take Spiriva prior   Echocardiogram 04/05/2018: LVEF 55 to 60%, no regional wall motion abnormalities.  Normal LA, mildly dilated RV, normal RA.  Normal PASP.  Trivial TR, otherwise normal valves.  Heart Catheterization 2019: EF 55 to 65%, normal LV function and normal LVEDP.  No aortic stenosis, no significant coronary artery disease.  Allergies  Allergen Reactions  . Codeine Itching    Immunization  History  Administered Date(s) Administered  . DTaP 04/06/1996  . Influenza Split 08/02/2012, 07/05/2013  . Influenza Whole 07/03/2016  . Influenza-Unspecified 06/16/2019  . PFIZER SARS-COV-2 Vaccination 09/22/2019, 10/13/2019  . Pneumococcal Conjugate-13 08/31/2008, 05/23/2016  . Pneumococcal Polysaccharide-23 11/02/2013  . Tdap 09/25/2007, 08/31/2008  . Zoster 08/31/2008    Past Medical History:  Diagnosis Date  . Altitude sickness   . Asthma    exercise-induced; prn inhaler  . AV block, 2nd degree   . Bilateral inguinal hernia (BIH) 10/16/2012   Overview:  [facility] -Dr. Brantley Stage.  Observation for now 10/2012  . Complete heart block ([place]) 04/04/2018  . COVID-19   . Insomnia 07/14/2014  . Mobitz type 1 second degree atrioventricular block   . Nasal polyps   . OA (osteoarthritis) 09/24/2012  . 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 Reactive airway disease 09/24/2012  . Rotator cuff tear, right    impingement, AC arthrosis    Tobacco History: Social History   Tobacco Use  Smoking Status Never Smoker  Smokeless Tobacco Never Used   Counseling given: Not Answered   Outpatient Medications Prior to Visit  Medication Sig Dispense Refill  . albuterol (VENTOLIN HFA) 108 (90 Base) MCG/ACT inhaler Inhale 2 puffs into the lungs every 6 (six) hours as needed for wheezing or shortness of breath. 8 g 1  . budesonide-formoterol (SYMBICORT) 160-4.5 MCG/ACT inhaler Inhale 1 puff into the lungs 2 (two) times daily as needed (ASTHMA).     . eszopiclone (LUNESTA) 2 MG TABS tablet Take 2 mg by mouth as needed.    . Tiotropium Bromide Monohydrate (SPIRIVA RESPIMAT) 2.5 MCG/ACT AERS Inhale 1 puff into  the lungs daily. 4 g 1   No facility-administered medications prior to visit.    Review of Systems  Review of Systems  Constitutional: Negative.   Respiratory: Positive for cough, shortness of breath and wheezing. Negative for apnea, choking, chest tightness and stridor.     Cardiovascular: Negative for chest pain and leg swelling.    Physical Exam  BP 108/76 (BP Location: Left Arm, Cuff Size: Normal)   Pulse 75   Temp 97.8 F (36.6 C) (Temporal)   Ht 5\' 8"  (1.727 m)   Wt 166 lb (75.3 kg)   SpO2 95% Comment: RA  BMI 25.24 kg/m  Physical Exam Constitutional:      Appearance: Normal appearance.  HENT:     Head: Normocephalic and atraumatic.     Right Ear: Tympanic membrane normal.     Left Ear: Tympanic membrane normal.     Mouth/Throat:     Mouth: Mucous membranes are moist.     Pharynx: Oropharynx is clear.  Cardiovascular:     Rate and Rhythm: Normal rate and regular rhythm.  Pulmonary:     Effort: Pulmonary effort is normal.     Breath sounds: Wheezing present.  Musculoskeletal:        General: Normal range of motion.  Skin:    General: Skin is warm and dry.  Neurological:     General: No focal deficit present.     Mental Status: He is alert and oriented to person, place, and time. Mental status is at baseline.  Psychiatric:        Mood and Affect: Mood normal.        Behavior: Behavior normal.        Thought Content: Thought content normal.        Judgment: Judgment normal.      Lab Results:  CBC    Component Value Date/Time   WBC 5.7 09/29/2019 1427   RBC 4.98 09/29/2019 1427   HGB 15.9 09/29/2019 1427   HGB 14.6 11/06/2018 1257   HCT 48.1 09/29/2019 1427   HCT 45.9 11/06/2018 1257   PLT 179.0 09/29/2019 1427   PLT 201 11/06/2018 1257   MCV 96.7 09/29/2019 1427   MCV 95 11/06/2018 1257   MCH 31.5 02/24/2019 0718   MCHC 33.1 09/29/2019 1427   RDW 13.5 09/29/2019 1427   RDW 13.0 11/06/2018 1257   LYMPHSABS 1.5 09/29/2019 1427   MONOABS 0.4 09/29/2019 1427   EOSABS 0.6 09/29/2019 1427   BASOSABS 0.0 09/29/2019 1427    BMET    Component Value Date/Time   NA 140 02/24/2019 0718   NA 138 11/06/2018 1257   K 4.6 02/24/2019 0718   CL 106 02/24/2019 0718   CO2 24 02/24/2019 0718   GLUCOSE 91 02/24/2019 0718   BUN  33 (H) 02/24/2019 0718   BUN 20 11/06/2018 1257   CREATININE 1.37 (H) 02/24/2019 0718   CALCIUM 9.1 02/24/2019 0718   GFRNONAA 52 (L) 02/24/2019 0718   GFRAA 60 (L) 02/24/2019 0718    BNP    Component Value Date/Time   BNP 301.3 (H) 04/04/2018 2139    ProBNP No results found for: PROBNP  Imaging: No results found.   Assessment & Plan:   Allergic asthma - Reports persistent dyspnea, wheezing and cough. Requiring oral steroids 4+ times a year - PFTs confirm asthma with moderate obstruction - IgE 137; eos 600 - Trial Breo 200 one puff daily (failed Symbicort and Adviar in the past); Continue Spiriva two  puffs once daily - Add Singulair 10mg  at bedtime  - Start process of getting approval for Xolair  - FU in 2-4 weeks    Martyn Ehrich, NP 11/03/2019

## 2019-11-03 NOTE — Telephone Encounter (Signed)
Can we start process for getting patient on Xolair  Weight 166lb; IgE 137

## 2019-11-04 NOTE — Telephone Encounter (Signed)
Per the MyChart message the pt sent back to [place] this morning, he has completed his patient consent form. All other forms and insurance cards will be faxed to [employer] at this time. Will await summary of benefits.

## 2019-11-04 NOTE — Telephone Encounter (Signed)
Routing to [place] to let know that patient has submitted the consent form for his Xolair.

## 2019-11-08 DIAGNOSIS — R197 Diarrhea, unspecified: Secondary | ICD-10-CM | POA: Diagnosis not present

## 2019-11-08 DIAGNOSIS — R194 Change in bowel habit: Secondary | ICD-10-CM | POA: Diagnosis not present

## 2019-11-08 NOTE — Telephone Encounter (Signed)
Received summary of benefits from [place]. PA is needed. Contacted [employer] at [phone]. PA could not be completed over the phone. PA form is being faxed to our office. Will await form.

## 2019-11-09 NOTE — Telephone Encounter (Signed)
PA form has been received, filled out and faxed back to [facility] along with clinical information. Will await PA decision.

## 2019-11-11 DIAGNOSIS — L720 Epidermal cyst: Secondary | ICD-10-CM | POA: Diagnosis not present

## 2019-11-11 DIAGNOSIS — L820 Inflamed seborrheic keratosis: Secondary | ICD-10-CM | POA: Diagnosis not present

## 2019-11-11 DIAGNOSIS — L821 Other seborrheic keratosis: Secondary | ICD-10-CM | POA: Diagnosis not present

## 2019-11-11 NOTE — Telephone Encounter (Signed)
Called [employer] to follow up on pt's PA. Was advised that they did not have a PA on file for the pt. All forms and clinical information has been faxed to [facility] again. Will continue to follow.

## 2019-11-17 NOTE — Telephone Encounter (Signed)
PA was denied through pt's prescription benefit but is approved through his medical benefit. Pt's medication will have to be [employer].  Xolair Prefilled Syringe Order: 150mg  Prefilled Syringe:  #2 75mg  Prefilled Syringe: N/A Ordered Date: 11/17/2019 Expected date of arrival: 11/18/2019 Ordered by: Desmond Dike, [place]  Specialty Pharmacy: [facility]

## 2019-11-18 NOTE — Telephone Encounter (Signed)
Xolair Prefilled Syringe Received:  150mg  Prefilled Syringe >> quantity #2, lot # [id number], exp date 07/2020 75mg  Prefilled Syringe >> NA  Medication arrival date: 11/18/19  Received by: Parke Poisson, CMA    Patient will need to be contacted about his 1st appointment.  Routing back to the injection pool.

## 2019-11-18 NOTE — Telephone Encounter (Signed)
Spoke with pt. He had several questions about what to expect when starting Xolair. These questions have been answered to the best of my ability. I will contact the pt once we received his medication.

## 2019-11-22 NOTE — Telephone Encounter (Signed)
Xolair called back-- just found out medication was denied through their prescription plan so want to see how Mendel Ryder or Lattie Haw would like to proceed  Call back- [phone]

## 2019-11-24 ENCOUNTER — Telehealth: Payer: Self-pay | Admitting: Primary Care

## 2019-11-24 MED ORDER — EPINEPHRINE 0.3 MG/0.3ML IJ SOAJ: 0.3000 mg | Freq: Once | INTRAMUSCULAR | 2 refills | Status: AC

## 2019-11-24 NOTE — Telephone Encounter (Signed)
Spoke with pt to get him scheduled for his first Xolair injection. States that he would like to wait for now. He will call when he is ready to start treatment.

## 2019-11-24 NOTE — Telephone Encounter (Signed)
Spoke with pt. Pt is aware of our office policy when it comes to his first injection >> 2 hours wait, Epipen.  Rx for Epipen has been sent in. Appointment has been scheduled on 11/29/2019 at 1000. Nothing further needed.

## 2019-11-29 ENCOUNTER — Other Ambulatory Visit: Payer: Self-pay

## 2019-11-29 ENCOUNTER — Ambulatory Visit (INDEPENDENT_AMBULATORY_CARE_PROVIDER_SITE_OTHER): Payer: Medicare Other

## 2019-11-29 DIAGNOSIS — J4551 Severe persistent asthma with (acute) exacerbation: Secondary | ICD-10-CM

## 2019-11-29 MED ORDER — OMALIZUMAB 150 MG/ML ~~LOC~~ SOSY
300.0000 mg | PREFILLED_SYRINGE | Freq: Once | SUBCUTANEOUS | Status: AC
  Administered 2019-11-29: 300 mg via SUBCUTANEOUS

## 2019-11-29 NOTE — Progress Notes (Signed)
Patient presented to the office today for first-time Xolair injection.  Primary Pulmonologist: Geraldo Pitter NP Medication name: Xolair Strength: 300mg  Site(s): L and R Arm  Epi pen/Auvi-Q visible during appointment: Yes  Time of injection: 1015  Patient evaluated every 15-20 minutes per protocol x2 hours.  1st check: 1035 Evaluation: No Reaction  2nd check: 1055  Evaluation: No Reaction  3rd check: 1115  Evaluation: No Reaction  4th check: 1135   Evaluation: No Reaction  5th check: 1155  Evaluation: No Reaction  6th check: 1215  Evaluation: No Reaction

## 2019-11-30 DIAGNOSIS — Z20822 Contact with and (suspected) exposure to covid-19: Secondary | ICD-10-CM | POA: Diagnosis not present

## 2019-11-30 DIAGNOSIS — Z01812 Encounter for preprocedural laboratory examination: Secondary | ICD-10-CM | POA: Diagnosis not present

## 2019-12-02 ENCOUNTER — Ambulatory Visit: Payer: Medicare Other | Admitting: Primary Care

## 2019-12-07 DIAGNOSIS — R197 Diarrhea, unspecified: Secondary | ICD-10-CM | POA: Diagnosis not present

## 2019-12-07 DIAGNOSIS — K6389 Other specified diseases of intestine: Secondary | ICD-10-CM | POA: Diagnosis not present

## 2019-12-28 ENCOUNTER — Other Ambulatory Visit: Payer: Self-pay

## 2019-12-28 ENCOUNTER — Ambulatory Visit (INDEPENDENT_AMBULATORY_CARE_PROVIDER_SITE_OTHER): Payer: Medicare Other | Admitting: Primary Care

## 2019-12-28 ENCOUNTER — Encounter: Payer: Self-pay | Admitting: Primary Care

## 2019-12-28 ENCOUNTER — Ambulatory Visit: Payer: Medicare Other

## 2019-12-28 VITALS — BP 124/70 | HR 60 | Temp 97.8°F | Ht 68.0 in | Wt 170.4 lb

## 2019-12-28 DIAGNOSIS — J4551 Severe persistent asthma with (acute) exacerbation: Secondary | ICD-10-CM

## 2019-12-28 DIAGNOSIS — Z7952 Long term (current) use of systemic steroids: Secondary | ICD-10-CM | POA: Diagnosis not present

## 2019-12-28 DIAGNOSIS — J4550 Severe persistent asthma, uncomplicated: Secondary | ICD-10-CM | POA: Insufficient documentation

## 2019-12-28 MED ORDER — OMALIZUMAB 150 MG/ML ~~LOC~~ SOSY
300.0000 mg | PREFILLED_SYRINGE | Freq: Once | SUBCUTANEOUS | Status: AC
  Administered 2019-12-28: 300 mg via SUBCUTANEOUS

## 2019-12-28 NOTE — Assessment & Plan Note (Signed)
-   Asthma symptoms are stable. No recent exacerbations. Patient self stopped Breo, Spiriva and singulair. Intolerant to ICS/LABAs. Currently on low dose prednisone. Experiences increased cough and wheezing off oral prednisone.  - Plan: Continue Xolair injections as directed. Continue Prednisone 10mg  daily x 4-6 weeks then decrease to 5mg  daily. Goal taper to lowest effective dose/ discontinue. Continue albuterol two puffs 4-6 hours as needed for breakthrough shortness of breath/wheezing. If patient using >3 times a week needs to discuss resuming maintenance inhaler. Recheck IgE in 3 months. Recommend repeat PFTs in 6 months.

## 2019-12-28 NOTE — Patient Instructions (Signed)
Continue prednisone 10mg  daily for another 6 weeks then taper to 5mg  daily   Orders: Repeat IgE in 2-3 months   Follow-up: 2-3 months with Dr. Carlis Abbott or sooner if you find your

## 2019-12-28 NOTE — Progress Notes (Signed)
@Patient  ID: David [name], male    DOB: 06-16-1946, 74 y.o.   MRN: [id number]  Chief Complaint  Patient presents with  . Follow-up    F/U after starting Xolair. Received his 2nd injection today. States breathing has been doing well. Still on daily prednisone.     Referring provider: Wayland Salinas, *  HPI: 74 year old male, never smoked. PMH significant for moderate persistent asthma, elevated eosinophils. Patient of Dr. Carlis Abbott, last seen on 09/29/19. Continue Symbicort twice daily, added spiriva. PFTs confirm asthma with moderate obstruction. IgE 137; eos 600.   Previous [facility] pulmonary encounter: 11/03/2019 Patient presents today for acute visit. Reports ongoing dyspnea and wheezing, no improvement from addition of Spiriva. He is not currently taking Symbicort or any other ICS/LABA. He has tried Advair many years ago with no significant improvement. Compliant with taking spiriva every morning, using his Albuterol rescue inhaler three times a day or more. He required oral prednisone approximately 4 times in 2020. His symptoms do improve while on prednisone but he does not want to be on a steriod long term. Denies fever, chills, sweats, HA, chest tightness, chest pain, N/V/D.  12/28/2019 Patient presents today for 2 months follow-up. During last visit patient was given trial of given Trial Breo 200 and started approval for Xolair. He has failed Symbicort and Advair in the past. He was to continue Spiriva and we added Singulair. Patient states that his breathing has been fine since last visit. He has not experienced any shortness of breath. He has occasional wheezing but states that it is not as bad as it was. He uses his albuterol rescue inhaler once a week on average. He received his second dose of Xolair today. He stopped taking Breo, Spiriva and singulair. Reports that none of his inhalers have worked and have causes side effects including worsening morning cough. He reports that the only  thing that has ever helped him was prednisone. He is currently taking 10mg  prednisone from an old GI prescription. States that he has 3 bottles of 120 tablets at home. States that when he is off prednisone his wheezing will come back and he can not sleep at night d/t this breathing. He denies reflux symptoms. He works out three times a week without shortness of breath.   Allergies  Allergen Reactions  . Codeine Itching    Immunization History  Administered Date(s) Administered  . DTaP 04/06/1996  . Influenza Split 08/02/2012, 07/05/2013  . Influenza Whole 07/03/2016  . Influenza-Unspecified 06/16/2019  . PFIZER SARS-COV-2 Vaccination 09/22/2019, 10/13/2019  . Pneumococcal Conjugate-13 08/31/2008, 05/23/2016  . Pneumococcal Polysaccharide-23 11/02/2013  . Tdap 09/25/2007, 08/31/2008  . Zoster 08/31/2008    Past Medical History:  Diagnosis Date  . Altitude sickness   . Asthma    exercise-induced; prn inhaler  . AV block, 2nd degree   . Bilateral inguinal hernia (BIH) 10/16/2012   Overview:  [facility] -Dr. Brantley Stage.  Observation for now 10/2012  . Complete heart block (Meta) 04/04/2018  . COVID-19   . Insomnia 07/14/2014  . Mobitz type 1 second degree atrioventricular block   . Nasal polyps   . OA (osteoarthritis) 09/24/2012  . 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 Reactive airway disease 09/24/2012  . Rotator cuff tear, right    impingement, AC arthrosis    Tobacco History: Social History   Tobacco Use  Smoking Status Never Smoker  Smokeless Tobacco Never Used   Counseling given: Not Answered   Outpatient  Medications Prior to Visit  Medication Sig Dispense Refill  . albuterol (VENTOLIN HFA) 108 (90 Base) MCG/ACT inhaler Inhale 2 puffs into the lungs every 6 (six) hours as needed for wheezing or shortness of breath. 8 g 1  . eszopiclone (LUNESTA) 2 MG TABS tablet Take 2 mg by mouth as needed.    . predniSONE (DELTASONE) 10 MG tablet Take 10 mg by mouth  daily with breakfast.    . montelukast (SINGULAIR) 10 MG tablet Take 1 tablet (10 mg total) by mouth at bedtime. 30 tablet 11  . budesonide-formoterol (SYMBICORT) 160-4.5 MCG/ACT inhaler Inhale 1 puff into the lungs 2 (two) times daily as needed (ASTHMA).     . fluticasone furoate-vilanterol (BREO ELLIPTA) 200-25 MCG/INH AEPB Inhale 1 puff into the lungs daily before breakfast. 28 each 0  . Tiotropium Bromide Monohydrate (SPIRIVA RESPIMAT) 2.5 MCG/ACT AERS Inhale 1 puff into the lungs daily. 4 g 1   No facility-administered medications prior to visit.   Review of Systems  Review of Systems  Respiratory: Negative for shortness of breath and wheezing.    Physical Exam  BP 124/70   Pulse 60   Temp 97.8 F (36.6 C) (Temporal)   Ht 5\' 8"  (1.727 m)   Wt 170 lb 6.4 oz (77.3 kg)   SpO2 99% Comment: on RA  BMI 25.91 kg/m  Physical Exam Constitutional:      Appearance: Normal appearance.  Cardiovascular:     Rate and Rhythm: Normal rate and regular rhythm.  Pulmonary:     Effort: Pulmonary effort is normal.     Breath sounds: Normal breath sounds.  Musculoskeletal:        General: Normal range of motion.     Cervical back: Normal range of motion and neck supple. No tenderness.  Lymphadenopathy:     Cervical: No cervical adenopathy.  Neurological:     General: No focal deficit present.     Mental Status: He is alert and oriented to person, place, and time. Mental status is at baseline.  Psychiatric:        Mood and Affect: Mood normal.        Behavior: Behavior normal.        Thought Content: Thought content normal.        Judgment: Judgment normal.      Lab Results:  CBC    Component Value Date/Time   WBC 5.7 09/29/2019 1427   RBC 4.98 09/29/2019 1427   HGB 15.9 09/29/2019 1427   HGB 14.6 11/06/2018 1257   HCT 48.1 09/29/2019 1427   HCT 45.9 11/06/2018 1257   PLT 179.0 09/29/2019 1427   PLT 201 11/06/2018 1257   MCV 96.7 09/29/2019 1427   MCV 95 11/06/2018 1257    MCH 31.5 02/24/2019 0718   MCHC 33.1 09/29/2019 1427   RDW 13.5 09/29/2019 1427   RDW 13.0 11/06/2018 1257   LYMPHSABS 1.5 09/29/2019 1427   MONOABS 0.4 09/29/2019 1427   EOSABS 0.6 09/29/2019 1427   BASOSABS 0.0 09/29/2019 1427    BMET    Component Value Date/Time   NA 140 02/24/2019 0718   NA 138 11/06/2018 1257   K 4.6 02/24/2019 0718   CL 106 02/24/2019 0718   CO2 24 02/24/2019 0718   GLUCOSE 91 02/24/2019 0718   BUN 33 (H) 02/24/2019 0718   BUN 20 11/06/2018 1257   CREATININE 1.37 (H) 02/24/2019 0718   CALCIUM 9.1 02/24/2019 0718   GFRNONAA 52 (L) 02/24/2019 [id number]  GFRAA 60 (L) 02/24/2019 0718    BNP    Component Value Date/Time   BNP 301.3 (H) 04/04/2018 2139    ProBNP No results found for: PROBNP  Imaging: No results found.   Assessment & Plan:   Severe persistent asthma dependent on systemic steroids - Asthma symptoms are stable. No recent exacerbations. Patient self stopped Breo, Spiriva and singulair. Intolerant to ICS/LABAs. Currently on low dose prednisone. Experiences increased cough and wheezing off oral prednisone.  - Plan: Continue Xolair injections as directed. Continue Prednisone 10mg  daily x 4-6 weeks then decrease to 5mg  daily. Goal taper to lowest effective dose/ discontinue. Continue albuterol two puffs 4-6 hours as needed for breakthrough shortness of breath/wheezing. If patient using >3 times a week needs to discuss resuming maintenance inhaler. Recheck IgE in 3 months. Recommend repeat PFTs in 6 months.    Martyn Ehrich, NP 12/28/2019

## 2019-12-28 NOTE — Progress Notes (Signed)
All questions were answered by the patient before medication was administered. Have you been hospitalized in the last 10 days? No Do you have a fever? No Do you have a cough? No Do you have a headache or sore throat? No  

## 2020-01-07 ENCOUNTER — Telehealth: Payer: Self-pay | Admitting: Primary Care

## 2020-01-11 NOTE — Telephone Encounter (Signed)
Called and spoke with Patient. Patient scheduled 01/26/20 at 1615 for Xolair injection.  Nothing further at this time.

## 2020-01-11 NOTE — Telephone Encounter (Signed)
Please advise 

## 2020-01-17 ENCOUNTER — Telehealth: Payer: Self-pay | Admitting: Primary Care

## 2020-01-17 NOTE — Telephone Encounter (Signed)
Xolair Prefilled Syringe Order: 150mg  Prefilled Syringe:  #2 75mg  Prefilled Syringe: #n/a Ordered Date: 01/17/20 Expected date of arrival: 01/18/20 Ordered by: [place]: [facility]

## 2020-01-18 NOTE — Telephone Encounter (Signed)
Xolair Prefilled Syringe Received:  150mg  Prefilled Syringe >> quantity #2, lot # [id number], exp date 10/2020 75mg  Prefilled Syringe >> N/A Medication arrival date: 01/18/2020 Received by: Desmond Dike, [place]

## 2020-01-26 ENCOUNTER — Other Ambulatory Visit: Payer: Self-pay

## 2020-01-26 ENCOUNTER — Ambulatory Visit (INDEPENDENT_AMBULATORY_CARE_PROVIDER_SITE_OTHER): Payer: Medicare Other

## 2020-01-26 DIAGNOSIS — J4551 Severe persistent asthma with (acute) exacerbation: Secondary | ICD-10-CM

## 2020-01-26 MED ORDER — OMALIZUMAB 150 MG/ML ~~LOC~~ SOSY
300.0000 mg | PREFILLED_SYRINGE | Freq: Once | SUBCUTANEOUS | Status: AC
  Administered 2020-01-26: 300 mg via SUBCUTANEOUS

## 2020-01-26 NOTE — Progress Notes (Signed)
All questions were answered by the patient before medication was administered. Have you been hospitalized in the last 10 days? No Do you have a fever? No Do you have a cough? No Do you have a headache or sore throat? No  

## 2020-02-14 ENCOUNTER — Telehealth: Payer: Self-pay | Admitting: Primary Care

## 2020-02-14 NOTE — Telephone Encounter (Signed)
Xolair Prefilled Syringe Order: 150mg  Prefilled Syringe:  #2 75mg  Prefilled Syringe: #0 Ordered Date: 02/14/2020 Expected date of arrival: 02/15/2020 Ordered by: Desmond Dike, [place]  Specialty Pharmacy: [facility]

## 2020-02-15 DIAGNOSIS — M1611 Unilateral primary osteoarthritis, right hip: Secondary | ICD-10-CM | POA: Diagnosis not present

## 2020-02-15 DIAGNOSIS — T8484XD Pain due to internal orthopedic prosthetic devices, implants and grafts, subsequent encounter: Secondary | ICD-10-CM | POA: Diagnosis not present

## 2020-02-15 DIAGNOSIS — Z96651 Presence of right artificial knee joint: Secondary | ICD-10-CM | POA: Diagnosis not present

## 2020-02-15 NOTE — Telephone Encounter (Signed)
Xolair Prefilled Syringe Received:  150mg  Prefilled Syringe >> quantity #2, lot # [id number], exp date 10/2020 75mg  Prefilled Syringe >> N/A Medication arrival date: 02/15/2020 Received by: Desmond Dike, [place]

## 2020-02-22 DIAGNOSIS — M25521 Pain in right elbow: Secondary | ICD-10-CM | POA: Diagnosis not present

## 2020-02-23 ENCOUNTER — Telehealth: Payer: Self-pay | Admitting: Critical Care Medicine

## 2020-02-23 ENCOUNTER — Ambulatory Visit (INDEPENDENT_AMBULATORY_CARE_PROVIDER_SITE_OTHER): Payer: Medicare Other

## 2020-02-23 ENCOUNTER — Other Ambulatory Visit: Payer: Self-pay

## 2020-02-23 DIAGNOSIS — J4551 Severe persistent asthma with (acute) exacerbation: Secondary | ICD-10-CM

## 2020-02-23 MED ORDER — OMALIZUMAB 150 MG/ML ~~LOC~~ SOSY
300.0000 mg | PREFILLED_SYRINGE | Freq: Once | SUBCUTANEOUS | Status: AC
  Administered 2020-02-23: 300 mg via SUBCUTANEOUS

## 2020-02-23 NOTE — Telephone Encounter (Signed)
Yes, I am fine to check it next time he is in the office for his injection. Thanks!  When does he have his next office visit? I do not recall seeing him recently.  Julian Hy, DO 02/23/20 8:08 PM [facility]

## 2020-02-23 NOTE — Telephone Encounter (Signed)
Pt came in for his Xolair injection today. States that he read online that after 4 months of being on Xolair that another IgE level should be checked. Next month will be 4 months that he has been on Xolair.  Dr. Carlis Abbott - please advise if we need to recheck the pt's IgE level. Thanks.

## 2020-02-23 NOTE — Progress Notes (Signed)
All questions were answered by the patient before medication was administered. Have you been hospitalized in the last 10 days? No Do you have a fever? No Do you have a cough? No Do you have a headache or sore throat? No  

## 2020-02-24 DIAGNOSIS — R1031 Right lower quadrant pain: Secondary | ICD-10-CM | POA: Diagnosis not present

## 2020-02-24 NOTE — Telephone Encounter (Signed)
He does not have a pending OV with you or an APP at this time. I will reach out to him and get something scheduled. Order for IgE has been placed.

## 2020-02-24 NOTE — Telephone Encounter (Signed)
Thanks Ria Comment!  Julian Hy, DO 02/24/20 3:02 PM [facility]

## 2020-02-25 DIAGNOSIS — M25551 Pain in right hip: Secondary | ICD-10-CM | POA: Diagnosis not present

## 2020-02-29 DIAGNOSIS — M25521 Pain in right elbow: Secondary | ICD-10-CM | POA: Diagnosis not present

## 2020-03-09 DIAGNOSIS — M25521 Pain in right elbow: Secondary | ICD-10-CM | POA: Diagnosis not present

## 2020-03-13 ENCOUNTER — Telehealth: Payer: Self-pay | Admitting: Critical Care Medicine

## 2020-03-13 NOTE — Telephone Encounter (Signed)
Xolair Prefilled Syringe Order: 150mg  Prefilled Syringe:  #2 75mg  Prefilled Syringe: #0 Ordered Date: 03/13/2020 Expected date of arrival: 03/14/2020 Ordered by: Desmond Dike, [place]  Specialty Pharmacy: [facility]

## 2020-03-14 NOTE — Telephone Encounter (Signed)
Xolair Prefilled Syringe Received:  150mg  Prefilled Syringe >> quantity #2, lot # [id number], exp date 12/2020 75mg  Prefilled Syringe >> N/A Medication arrival date: 03/14/2020 Received by: Desmond Dike, [place]

## 2020-03-15 DIAGNOSIS — H25041 Posterior subcapsular polar age-related cataract, right eye: Secondary | ICD-10-CM | POA: Diagnosis not present

## 2020-03-15 DIAGNOSIS — H2511 Age-related nuclear cataract, right eye: Secondary | ICD-10-CM | POA: Diagnosis not present

## 2020-03-15 DIAGNOSIS — H25011 Cortical age-related cataract, right eye: Secondary | ICD-10-CM | POA: Diagnosis not present

## 2020-03-22 ENCOUNTER — Other Ambulatory Visit: Payer: Self-pay

## 2020-03-22 ENCOUNTER — Ambulatory Visit (INDEPENDENT_AMBULATORY_CARE_PROVIDER_SITE_OTHER): Payer: Medicare Other

## 2020-03-22 DIAGNOSIS — J4551 Severe persistent asthma with (acute) exacerbation: Secondary | ICD-10-CM

## 2020-03-22 MED ORDER — OMALIZUMAB 150 MG/ML ~~LOC~~ SOSY
300.0000 mg | PREFILLED_SYRINGE | Freq: Once | SUBCUTANEOUS | Status: AC
Start: 2020-03-22 — End: 2020-03-22
  Administered 2020-03-22: 300 mg via SUBCUTANEOUS

## 2020-03-22 NOTE — Addendum Note (Signed)
Addended by: Suzzanne Cloud E on: 03/22/2020 04:42 PM   Modules accepted: Orders

## 2020-03-22 NOTE — Progress Notes (Signed)
Have you been hospitalized within the last 10 days?  No Do you have a fever?  No Do you have a cough?  No Do you have a headache or sore throat? No Do you have your Epi Pen visible and is it within date?  Yes 

## 2020-03-23 LAB — IGE: IgE (Immunoglobulin E), Serum: 297 kU/L — ABNORMAL HIGH (ref <=114)

## 2020-03-23 NOTE — Progress Notes (Signed)
Please let David [name] know that his IgE level is elevated, which is expected on Xolair injections. This test measures the IgE that is bound by the medication which can make it stay in the blood stream longer. This number does not change the recommendation to stay on Xolair. Thanks!

## 2020-04-03 NOTE — Progress Notes (Signed)
Called and left voicemail to call back for further explanation of lab result per patient request.

## 2020-04-04 DIAGNOSIS — Z9841 Cataract extraction status, right eye: Secondary | ICD-10-CM | POA: Diagnosis not present

## 2020-04-04 DIAGNOSIS — H5201 Hypermetropia, right eye: Secondary | ICD-10-CM | POA: Diagnosis not present

## 2020-04-04 DIAGNOSIS — H2511 Age-related nuclear cataract, right eye: Secondary | ICD-10-CM | POA: Diagnosis not present

## 2020-04-04 DIAGNOSIS — H25811 Combined forms of age-related cataract, right eye: Secondary | ICD-10-CM | POA: Diagnosis not present

## 2020-04-04 DIAGNOSIS — H5212 Myopia, left eye: Secondary | ICD-10-CM | POA: Diagnosis not present

## 2020-04-04 DIAGNOSIS — H524 Presbyopia: Secondary | ICD-10-CM | POA: Diagnosis not present

## 2020-04-04 DIAGNOSIS — H52223 Regular astigmatism, bilateral: Secondary | ICD-10-CM | POA: Diagnosis not present

## 2020-04-04 DIAGNOSIS — Z961 Presence of intraocular lens: Secondary | ICD-10-CM | POA: Diagnosis not present

## 2020-04-10 ENCOUNTER — Telehealth: Payer: Self-pay | Admitting: Critical Care Medicine

## 2020-04-10 NOTE — Telephone Encounter (Signed)
Xolair Prefilled Syringe Order: 150mg  Prefilled Syringe:  #2 75mg  Prefilled Syringe: #0 Ordered Date: 04/10/2020 Expected date of arrival: 04/11/2020 Ordered by: Desmond Dike, [place]  Specialty Pharmacy: [facility]

## 2020-04-11 NOTE — Telephone Encounter (Signed)
Xolair Prefilled Syringe Received:  150mg  Prefilled Syringe >> quantity #2, lot # [id number], exp date 01/2021 75mg  Prefilled Syringe >> N/A Medication arrival date: 04/11/2020 Received by: Desmond Dike, [place]

## 2020-04-20 ENCOUNTER — Ambulatory Visit (INDEPENDENT_AMBULATORY_CARE_PROVIDER_SITE_OTHER): Payer: Medicare Other

## 2020-04-20 ENCOUNTER — Other Ambulatory Visit: Payer: Self-pay

## 2020-04-20 DIAGNOSIS — J4551 Severe persistent asthma with (acute) exacerbation: Secondary | ICD-10-CM | POA: Diagnosis not present

## 2020-04-20 MED ORDER — OMALIZUMAB 150 MG/ML ~~LOC~~ SOSY
300.0000 mg | PREFILLED_SYRINGE | Freq: Once | SUBCUTANEOUS | Status: AC
  Administered 2020-04-20: 300 mg via SUBCUTANEOUS

## 2020-04-20 NOTE — Progress Notes (Signed)
Have you been hospitalized within the last 10 days?  No Do you have a fever?  No Do you have a cough?  No Do you have a headache or sore throat? No Do you have your Epi Pen visible and is it within date?  Yes 

## 2020-04-28 ENCOUNTER — Ambulatory Visit: Payer: Self-pay | Admitting: Surgery

## 2020-05-01 NOTE — Progress Notes (Signed)
Your procedure is scheduled on Tuesday, September 7th.  Report to [facility] Main Entrance "A" at 9:00 A.M., and check in at the Admitting office.  Call this number if you have problems the morning of surgery:  [phone]  Call [phone] if you have any questions prior to your surgery date Monday-Friday 8am-4pm   Remember:  Do not eat or drink after midnight the night before your surgery    Take these medicines the morning of surgery with A SIP OF WATER: NONE   Follow your surgeon's instructions on when to stop Aspirin.  If no instructions were given by your surgeon then you will need to call the office to get those instructions.    As of today, STOP taking Aleve, Naproxen, Ibuprofen, Motrin, Advil, Goody's, BC's, all herbal medications, fish oil, and all vitamins.             Do not wear jewelry.            Do not wear lotions, powders, colognes, or deodorant.            Men may shave face and neck.            Do not bring valuables to the hospital.            [facility] is not responsible for any belongings or valuables.  Do NOT Smoke (Tobacco/Vaping) or drink Alcohol 24 hours prior to your procedure If you use a CPAP at night, you may bring all equipment for your overnight stay.   Contacts, glasses, dentures or bridgework may not be worn into surgery.      For patients admitted to the hospital, discharge time will be determined by your treatment team.   Patients discharged the day of surgery will not be allowed to drive home, and someone needs to stay with them for 24 hours.  Special instructions:   [facility]- Preparing For Surgery  Before surgery, you can play an important role. Because skin is not sterile, your skin needs to be as free of germs as possible. You can reduce the number of germs on your skin by washing with CHG (chlorahexidine gluconate) Soap before surgery.  CHG is an antiseptic cleaner which kills germs and bonds with the skin to continue killing germs  even after washing.    Oral Hygiene is also important to reduce your risk of infection.  Remember - BRUSH YOUR TEETH THE MORNING OF SURGERY WITH YOUR REGULAR TOOTHPASTE  Please do not use if you have an allergy to CHG or antibacterial soaps. If your skin becomes reddened/irritated stop using the CHG.  Do not shave (including legs and underarms) for at least 48 hours prior to first CHG shower. It is OK to shave your face.  Please follow these instructions carefully.   1. Shower the NIGHT BEFORE SURGERY and the MORNING OF SURGERY with CHG Soap.   2. If you chose to wash your hair, wash your hair first as usual with your normal shampoo.  3. After you shampoo, rinse your hair and body thoroughly to remove the shampoo.  4. Use CHG as you would any other liquid soap. You can apply CHG directly to the skin and wash gently with a scrungie or a clean washcloth.   5. Apply the CHG Soap to your body ONLY FROM THE NECK DOWN.  Do not use on open wounds or open sores. Avoid contact with your eyes, ears, mouth and genitals (private parts). Wash Face and genitals (  private parts)  with your normal soap.   6. Wash thoroughly, paying special attention to the area where your surgery will be performed.  7. Thoroughly rinse your body with warm water from the neck down.  8. DO NOT shower/wash with your normal soap after using and rinsing off the CHG Soap.  9. Pat yourself dry with a CLEAN TOWEL.  10. Wear CLEAN PAJAMAS to bed the night before surgery  11. Place CLEAN SHEETS on your bed the night of your first shower and DO NOT SLEEP WITH PETS.  Day of Surgery: Wear Clean/Comfortable clothing the morning of surgery Do not apply any deodorants/lotions.   Remember to brush your teeth WITH YOUR REGULAR TOOTHPASTE.   Please read over the following fact sheets that you were given.

## 2020-05-02 ENCOUNTER — Encounter (HOSPITAL_COMMUNITY): Payer: Self-pay

## 2020-05-02 ENCOUNTER — Other Ambulatory Visit: Payer: Self-pay

## 2020-05-02 ENCOUNTER — Encounter (HOSPITAL_COMMUNITY)
Admission: RE | Admit: 2020-05-02 | Discharge: 2020-05-02 | Disposition: A | Discharge status: Home / Self Care | Payer: Medicare Other | Source: Ambulatory Visit | Attending: Surgery | Admitting: Surgery

## 2020-05-02 DIAGNOSIS — Z79899 Other long term (current) drug therapy: Secondary | ICD-10-CM | POA: Diagnosis not present

## 2020-05-02 DIAGNOSIS — Z7982 Long term (current) use of aspirin: Secondary | ICD-10-CM | POA: Insufficient documentation

## 2020-05-02 DIAGNOSIS — K4090 Unilateral inguinal hernia, without obstruction or gangrene, not specified as recurrent: Secondary | ICD-10-CM | POA: Diagnosis not present

## 2020-05-02 DIAGNOSIS — Z8616 Personal history of COVID-19: Secondary | ICD-10-CM | POA: Insufficient documentation

## 2020-05-02 DIAGNOSIS — I441 Atrioventricular block, second degree: Secondary | ICD-10-CM | POA: Insufficient documentation

## 2020-05-02 DIAGNOSIS — Z01812 Encounter for preprocedural laboratory examination: Secondary | ICD-10-CM | POA: Insufficient documentation

## 2020-05-02 DIAGNOSIS — J4540 Moderate persistent asthma, uncomplicated: Secondary | ICD-10-CM | POA: Diagnosis not present

## 2020-05-02 LAB — BASIC METABOLIC PANEL
Anion gap: 10 (ref 5–15)
BUN: 30 mg/dL — ABNORMAL HIGH (ref 8–23)
CO2: 28 mmol/L (ref 22–32)
Calcium: 9.5 mg/dL (ref 8.9–10.3)
Chloride: 101 mmol/L (ref 98–111)
Creatinine, Ser: 1.13 mg/dL (ref 0.61–1.24)
GFR calc Af Amer: >60 mL/min (ref >60)
GFR calc non Af Amer: >60 mL/min (ref >60)
Glucose, Bld: 95 mg/dL (ref 70–99)
Potassium: 4.6 mmol/L (ref 3.5–5.1)
Sodium: 139 mmol/L (ref 135–145)

## 2020-05-02 LAB — CBC
HCT: 47.1 % (ref 39.0–52.0)
Hemoglobin: 15.4 g/dL (ref 13.0–17.0)
MCH: 32.4 pg (ref 26.0–34.0)
MCHC: 32.7 g/dL (ref 30.0–36.0)
MCV: 98.9 fL (ref 80.0–100.0)
Platelets: 222 10*3/uL (ref 150–400)
RBC: 4.76 MIL/uL (ref 4.22–5.81)
RDW: 14.3 % (ref 11.5–15.5)
WBC: 6.4 10*3/uL (ref 4.0–10.5)
nRBC: 0 % (ref 0.0–0.2)

## 2020-05-02 NOTE — Anesthesia Preprocedure Evaluation (Addendum)
Anesthesia Evaluation  Patient identified by MRN, date of birth, ID band Patient awake    Reviewed: Allergy & Precautions, NPO status , Patient's Chart, lab work & pertinent test results  Airway Mallampati: II  TM Distance: >3 FB Neck ROM: Full    Dental no notable dental hx. 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 Wall thickness was normal. Systolic function was normal. The estimated ejectionfraction was in the range of 55% to 60%. Wall motion was normal; there were no regional wall motion abnormalities.  - Aortic valve: Trileaflet; mildly thickened, mildly calcified  leaflets.  - Right ventricle: The cavity size was mildly dilated. Wall  thickness was normal.  - Tricuspid valve: There was trivial regurgitation.   Last evaluation by Dr. Curt Bears was on 10/11/19 for bradycardia, 2nd degree AVB, aflutter ablation follow-up. He was in 2nd degree Mobitz 1 that day. Patient preferred to avoid PPM. Still asymptomatic, and able to go to the gym and get HR into the 120s. One year follow-up recommended.   Cardiac cath 04/06/18: The left ventricular systolic function is normal. LV end diastolic pressure is normal. The left ventricular ejection fraction is 55-65% by visual estimate. There is no aortic valve stenosis. No significant CAD. (LM, LAD, LCX, RCA angiographically normal.   Neuro/Psych negative neurological ROS  negative psych ROS   GI/Hepatic Neg liver ROS, Right inguinal hernia    Endo/Other  negative endocrine ROS  Renal/GU negative Renal ROS  negative  genitourinary   Musculoskeletal  (+) Arthritis , Osteoarthritis,    Abdominal   Peds  Hematology negative hematology ROS (+)   Anesthesia Other Findings   Reproductive/Obstetrics negative OB ROS                            Anesthesia Physical Anesthesia Plan  ASA: III  Anesthesia Plan: General and Regional   Post-op Pain Management: GA combined w/ Regional for post-op pain   Induction: Intravenous  PONV Risk Score and Plan: 2 and Ondansetron, Dexamethasone, Midazolam and Treatment may vary due to age or medical condition  Airway Management Planned: Oral ETT  Additional Equipment: None  Intra-op Plan:   Post-operative Plan: Extubation in OR  Informed Consent: I have reviewed the patients History and Physical, chart, labs and discussed the procedure including the risks, benefits and alternatives for the proposed anesthesia with the patient or authorized representative who has indicated his/her understanding and acceptance.     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 Pt has elected to avoid a PPM. D/w patient spinal vs GA, but given very low heart rate and dysrhythmia I think it best to avoid a spinal in case of effects on the cardioaccelerator fibers. Will proceed with GA and TAP block for postoperative pain, as I believe this is the safest option for the patient. He is agreeable with the plan. Will have pads available in room for transcutaneous pacing in the event of deterioration into complete AVB.)       Anesthesia Quick  Evaluation

## 2020-05-02 NOTE — Progress Notes (Signed)
Anesthesia PAT Evaluation:  Case: [id number] Date/Time: 05/09/20 1046   Procedure: OPEN RIGHT INGUINAL HERNIA REPAIR WITH MESH (Right ) - MAC COMBINED WITH REGIONAL FOR POSTOP PAIN   Anesthesia type: Monitor Anesthesia Care   Pre-op diagnosis: RIGHT INGUINAL HERNIA   Location: [place] OR ROOM 02 / [place]   Surgeons: [name], MD      DISCUSSION: Patient is a 74 year old male scheduled for the above procedure.   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 Cataract surgery last month with Vevelyn Royals, MD.   Last evaluation by Dr. Curt Bears was on 10/11/19 for bradycardia, 2nd degree AVB, aflutter ablation follow-up. He was in 2nd degree Mobitz 1 that day. Patient preferred to avoid PPM. Still asymptomatic, and able to go to the gym and get HR into the 120s. One year follow-up recommended.   Asthma stable at 12/28/19 visit on Xolair monthly injections, Breo, Spiriva, Singulair. He was intolerant to Symbicort (increased coughing/mucous). He also takes intermittent 4-5 day courses of prednisone 10 mg during periods of asthma exacerbations. Albuterol MDI is PRN, but has not needed recently.   At PAT, he reported that he continues to do well from a cardiopulmonary standpoint. He spends about an hour at the gym 2-4 times per week which includes 20 minutes on stair master and 20 minutes of weights. He can get his heart rate up to the 130's, but tries to keep it ~ 115-120. No chest pain, SOB, syncope, edema. No recent asthma exacerbations--Xolair has helped with control.   Discussed above with anesthesiologist Oleta Mouse, MD. Patient with known baseline Wenckebach and bradycardia. He continues to have  good exercise tolerance without CV symptoms. Will plan for EKG on the day of surgery to get most up-to-date rhythm. Patient reported what sounds like persistent Mobitz 1 block when he had cataract surgery last month. Also discussed that although case is scheduled for "MAC combined with Regional for postop pain", it is possible that general anesthesia will be required. His preference is for MAC/regional, but is comfortable with general anesthesia if indicated--definitive anesthesia plan on the day of surgery following anesthesiologist evaluation.      [place] COVID-19 vaccine 10/13/19.  Presurgical COVID-19 test is scheduled for 05/05/2020.   VS: BP (!) 151/83   Pulse (!) 48   Temp 36.7 C (Oral)   Resp 18   Ht _0  (1.753 m)   Wt 72.7 kg   SpO2 98%   BMI 23.67 kg/m  Heart regular, bradycardic. No murmur noted. Lungs clear without wheezes. No ankle edema.    PROVIDERS: Wayland Salinas, MD is PCP ([place]) - Noemi Chapel, DO is pulmonologist. Last evaluation 12/28/19 by Geraldo Pitter, NP.  Curt Bears, Will, MD is EP cardiologist   LABS: Labs reviewed: Acceptable for surgery. (all labs ordered are listed, but only abnormal results are displayed)  Labs Reviewed  BASIC METABOLIC PANEL - Abnormal; Notable for the following components:      Result Value   BUN 30 (*)    All other components within normal limits  CBC    PFTs 10/18/19: FVC 3.65 (91%), post 3.73 (93%). FEV1 2.27 (78%), post 2.46 (84%). FEV/FVC 62% (85%), post 66%. DLCO unc 26.60 (111%).  Conclusions: Moderate  airway obstruction is present. Pulmonary Function Diagnosis: Moderate Obstructive Airways Disease with air trapping.   EKG: Per discussion with Dr. Ermalene Postin, EKG on the day of surgery for most up to date baseline. Known SR/SB with 2nd degree AV block (Mobitz 1) on 03/29/19 and 10/11/19 tracings at [facility].   CV: 48 Hour Holter monitor 04/09/18: 1st Degree AV Block - Marked 1st Degree Block.  2nd Degree AV Block. Rare Pauses counted appear to be due to Marked Arrhythmia and episodes of 2:1 Conduction, the longest measures 2.16 sec. @ 27 bpm. Occasional to Trigeminy Multiform / Multifocal PVC's. Isolated Ventricular Couplets. Isolated 3 beat Ventricular Run. No symptoms listed in Diary entries. Minimum HR: 28 BPM at 7:36:27 AM(2) Maximum HR: 111 BPM at 9:36:54 AM(2) Average HR: 53 BPM 1% PVCs Sinus rhythm with mobitz I block, 2:1 AV block, and complete heart block during waking hours with narrow complex QRS   Cardiac cath 04/06/18:  The left ventricular systolic function is normal.  LV end diastolic pressure is normal.  The left ventricular ejection fraction is 55-65% by visual estimate.  There is no aortic valve stenosis.  No significant CAD. [LM, LAD, LCX, RCA angiographically normal.]    Echo 04/05/18: Study Conclusions  - Left ventricle: The cavity size was normal. Wall thickness was  normal. Systolic function was normal. The estimated ejection  fraction was in the range of 55% to 60%. Wall motion was normal;  there were no regional wall motion abnormalities.  - Aortic valve: Trileaflet; mildly thickened, mildly calcified  leaflets.  - Right ventricle: The cavity size was mildly dilated. Wall  thickness was normal.  - Tricuspid valve: There was trivial regurgitation.    Past Medical History:  Diagnosis Date  . Altitude sickness   . Asthma    exercise-induced; prn inhaler  . AV block, 2nd degree   . Bilateral inguinal hernia (BIH) 10/16/2012   Overview:  [facility] -Dr. Brantley Stage.  Observation for now 10/2012  . Complete heart block ([place]) 04/04/2018  . COVID-19   . DVT (deep venous thrombosis) ([place]) 08/13/2018   Right peroneal DVT 08/13/18, post right TKA 08/10/18  . Insomnia 07/14/2014  . Mobitz type 1 second degree atrioventricular block   . Nasal polyps   . OA (osteoarthritis) 09/24/2012  . 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  Reactive airway disease 09/24/2012  . Rotator cuff tear, right    impingement, AC arthrosis    Past Surgical History:  Procedure Laterality Date  . A-FLUTTER ABLATION N/A 02/24/2019   Procedure: A-FLUTTER ABLATION;  Surgeon: Constance Haw, MD;  Location: [place] [facility];  Service: Cardiovascular;  Laterality: N/A;  . BIOPSY  07/23/2019   Procedure: BIOPSY;  Surgeon: Carol Ada, MD;  [facility];  Service: Endoscopy;;  . CATARACT EXTRACTION W/ INTRAOCULAR LENS IMPLANT Right   . CATARACT EXTRACTION W/ INTRAOCULAR LENS IMPLANT Left    per patient about two years ago 2019  . COLONOSCOPY WITH PROPOFOL N/A 07/23/2019   Procedure: COLONOSCOPY WITH PROPOFOL;  Surgeon: Carol Ada, MD;  [facility];  Service: Endoscopy;  Laterality: N/A;  . LEFT HEART CATH AND CORONARY ANGIOGRAPHY N/A 04/06/2018   Procedure: LEFT HEART CATH AND CORONARY ANGIOGRAPHY;  Surgeon: Jettie Booze, MD;  Location: [place] [facility];  Service: Cardiovascular;  Laterality: N/A;  . LUMBAR LAMINECTOMY  age 73s  . NASAL SINUS SURGERY    . POLYPECTOMY  07/23/2019   Procedure: POLYPECTOMY;  Surgeon: Carol Ada, MD;  [facility];  Service: Endoscopy;;  . REFRACTIVE SURGERY    . ROTATOR CUFF REPAIR Right   . SHOULDER ARTHROSCOPY  08/06/2011   Procedure: ARTHROSCOPY SHOULDER;  Surgeon: Cammie Sickle., MD;  Location: [place];  Service: Orthopedics;  Laterality: Right;  with subacromial decompression, arthroscopic subscapularis repair, and rotator cuff repair  . TOTAL KNEE ARTHROPLASTY Right 08/10/2018   Procedure: TOTAL KNEE ARTHROPLASTY;  Surgeon: Frederik Pear, MD;  [facility];  Service: Orthopedics;  Laterality: Right;  . TRIGGER FINGER RELEASE Right 06/30/2014   Procedure: RELEASE A-1 PULLEY RIGHT THUMB;  Surgeon: Daryll Brod, MD;  Location: [facility];  Service: Orthopedics;  Laterality: Right;  ANESTHESIA:  IV REGIONAL FAB     MEDICATIONS: . albuterol (VENTOLIN HFA) 108 (90 Base) MCG/ACT inhaler  . aspirin EC 81 MG tablet  . eszopiclone (LUNESTA) 2 MG TABS tablet  . Multiple Vitamin (MULTIVITAMIN WITH MINERALS) TABS tablet  . omalizumab Arvid Right) 75 MG/0.5ML prefilled syringe  . predniSONE (DELTASONE) 10 MG tablet   No current facility-administered medications for this encounter.  He is not currently taking albuterol, Lunesta, prednisone (takes as needed for asthma exacerbations). ASA is on hold for surgery.    Myra Gianotti, PA-C Surgical Short Stay/Anesthesiology [facility] Phone [phone] [facility] Phone [phone] 05/02/2020 2:53 PM

## 2020-05-02 NOTE — Progress Notes (Addendum)
PCP - Dr. Elenor Quinones Cardiologist - Dr. Ocie Doyne [facility] Pulmonologist - Dr. Noemi Chapel  PPM/ICD - denies  Chest x-ray - N/A EKG - 10/11/2019 Stress Test - denies ECHO - 04/15/18 Cardiac Cath - 04/06/18  Sleep Study - denies CPAP - N/A  DM: denies  Blood Thinner Instructions: N/A Aspirin Instructions: Patient instructed to call surgeon's office to get instructions on when to stop taking Aspirin for surgery.  ERAS Protcol - No  COVID TEST- Scheduled for 05/05/2020. Patient verbalized understanding of self-quarantine instructions, appointment time and place.  Anesthesia review: YES, per MD order, cardiac hx  Patient denies shortness of breath, fever, cough and chest pain at PAT appointment  All instructions explained to the patient, with a verbal understanding of the material. Patient agrees to go over the instructions while at home for a better understanding. Patient also instructed to self quarantine after being tested for COVID-19. The opportunity to ask questions was provided.

## 2020-05-05 ENCOUNTER — Other Ambulatory Visit (HOSPITAL_COMMUNITY)
Admission: RE | Admit: 2020-05-05 | Discharge: 2020-05-05 | Disposition: A | Discharge status: Home / Self Care | Payer: Medicare Other | Source: Ambulatory Visit | Attending: Surgery | Admitting: Surgery

## 2020-05-05 DIAGNOSIS — Z20822 Contact with and (suspected) exposure to covid-19: Secondary | ICD-10-CM | POA: Diagnosis not present

## 2020-05-05 DIAGNOSIS — Z01818 Encounter for other preprocedural examination: Secondary | ICD-10-CM | POA: Insufficient documentation

## 2020-05-05 LAB — SARS CORONAVIRUS 2 (TAT 6-24 HRS): SARS Coronavirus 2: NEGATIVE

## 2020-05-05 MED ORDER — BUPIVACAINE LIPOSOME 1.3 % IJ SUSP
20.0000 mL | Freq: Once | INTRAMUSCULAR | Status: DC
  Filled 2020-05-05: qty 20

## 2020-05-09 ENCOUNTER — Telehealth: Payer: Self-pay | Admitting: Critical Care Medicine

## 2020-05-09 ENCOUNTER — Ambulatory Visit (HOSPITAL_COMMUNITY): Payer: Medicare Other

## 2020-05-09 ENCOUNTER — Encounter (HOSPITAL_COMMUNITY): Payer: Self-pay | Admitting: Surgery

## 2020-05-09 ENCOUNTER — Encounter (HOSPITAL_COMMUNITY)
Admission: RE | Disposition: A | Discharge status: Home / Self Care | Payer: Self-pay | Source: Ambulatory Visit | Attending: Surgery

## 2020-05-09 ENCOUNTER — Other Ambulatory Visit: Payer: Self-pay

## 2020-05-09 ENCOUNTER — Ambulatory Visit (HOSPITAL_COMMUNITY): Payer: Medicare Other | Admitting: Vascular Surgery

## 2020-05-09 ENCOUNTER — Ambulatory Visit (HOSPITAL_COMMUNITY)
Admission: RE | Admit: 2020-05-09 | Discharge: 2020-05-09 | Disposition: A | Discharge status: Home / Self Care | Payer: Medicare Other | Source: Ambulatory Visit | Attending: Surgery | Admitting: Surgery

## 2020-05-09 DIAGNOSIS — M1711 Unilateral primary osteoarthritis, right knee: Secondary | ICD-10-CM | POA: Diagnosis not present

## 2020-05-09 DIAGNOSIS — K4090 Unilateral inguinal hernia, without obstruction or gangrene, not specified as recurrent: Secondary | ICD-10-CM | POA: Diagnosis not present

## 2020-05-09 DIAGNOSIS — Z8616 Personal history of COVID-19: Secondary | ICD-10-CM | POA: Diagnosis not present

## 2020-05-09 DIAGNOSIS — K4091 Unilateral inguinal hernia, without obstruction or gangrene, recurrent: Secondary | ICD-10-CM | POA: Insufficient documentation

## 2020-05-09 DIAGNOSIS — K4020 Bilateral inguinal hernia, without obstruction or gangrene, not specified as recurrent: Secondary | ICD-10-CM | POA: Diagnosis not present

## 2020-05-09 DIAGNOSIS — Z86718 Personal history of other venous thrombosis and embolism: Secondary | ICD-10-CM | POA: Insufficient documentation

## 2020-05-09 DIAGNOSIS — Z96651 Presence of right artificial knee joint: Secondary | ICD-10-CM | POA: Diagnosis not present

## 2020-05-09 DIAGNOSIS — M1990 Unspecified osteoarthritis, unspecified site: Secondary | ICD-10-CM | POA: Insufficient documentation

## 2020-05-09 DIAGNOSIS — I442 Atrioventricular block, complete: Secondary | ICD-10-CM | POA: Diagnosis not present

## 2020-05-09 DIAGNOSIS — G8918 Other acute postprocedural pain: Secondary | ICD-10-CM | POA: Diagnosis not present

## 2020-05-09 HISTORY — PX: INSERTION OF MESH: SHX5868

## 2020-05-09 HISTORY — PX: INGUINAL HERNIA REPAIR: SHX194

## 2020-05-09 SURGERY — REPAIR, HERNIA, INGUINAL, ADULT
Anesthesia: Regional | Site: Inguinal | Laterality: Right

## 2020-05-09 MED ORDER — ONDANSETRON HCL 4 MG/2ML IJ SOLN: 4.0000 mg | Freq: Once | INTRAMUSCULAR | Status: DC | PRN

## 2020-05-09 MED ORDER — PROPOFOL 10 MG/ML IV BOLUS
INTRAVENOUS | Status: AC
  Filled 2020-05-09: qty 20

## 2020-05-09 MED ORDER — MIDAZOLAM HCL 2 MG/2ML IJ SOLN
INTRAMUSCULAR | Status: AC
  Filled 2020-05-09: qty 2

## 2020-05-09 MED ORDER — FENTANYL CITRATE (PF) 250 MCG/5ML IJ SOLN
INTRAMUSCULAR | Status: AC
  Filled 2020-05-09: qty 5

## 2020-05-09 MED ORDER — EPHEDRINE 5 MG/ML INJ
INTRAVENOUS | Status: AC
  Filled 2020-05-09: qty 10

## 2020-05-09 MED ORDER — EPHEDRINE SULFATE 50 MG/ML IJ SOLN
INTRAMUSCULAR | Status: DC | PRN
  Administered 2020-05-09 (×2): 5 mg via INTRAVENOUS
  Administered 2020-05-09: 10 mg via INTRAVENOUS

## 2020-05-09 MED ORDER — FENTANYL CITRATE (PF) 100 MCG/2ML IJ SOLN
INTRAMUSCULAR | Status: DC | PRN
Start: 2020-05-09 — End: 2020-05-09
  Administered 2020-05-09: 50 ug via INTRAVENOUS

## 2020-05-09 MED ORDER — GLYCOPYRROLATE 0.2 MG/ML IJ SOLN
INTRAMUSCULAR | Status: DC | PRN
  Administered 2020-05-09: .2 mg via INTRAVENOUS

## 2020-05-09 MED ORDER — BUPIVACAINE HCL (PF) 0.25 % IJ SOLN
INTRAMUSCULAR | Status: DC | PRN
  Administered 2020-05-09: 20 mL

## 2020-05-09 MED ORDER — LACTATED RINGERS IV SOLN: INTRAVENOUS | Status: DC

## 2020-05-09 MED ORDER — ORAL CARE MOUTH RINSE: 15.0000 mL | Freq: Once | OROMUCOSAL | Status: AC

## 2020-05-09 MED ORDER — CHLORHEXIDINE GLUCONATE CLOTH 2 % EX PADS: 6.0000 | MEDICATED_PAD | Freq: Once | CUTANEOUS | Status: DC

## 2020-05-09 MED ORDER — ROCURONIUM BROMIDE 100 MG/10ML IV SOLN
INTRAVENOUS | Status: DC | PRN
  Administered 2020-05-09: 50 mg via INTRAVENOUS

## 2020-05-09 MED ORDER — SUGAMMADEX SODIUM 200 MG/2ML IV SOLN
INTRAVENOUS | Status: DC | PRN
  Administered 2020-05-09: 150 mg via INTRAVENOUS

## 2020-05-09 MED ORDER — ACETAMINOPHEN 500 MG PO TABS: 1000.0000 mg | ORAL_TABLET | Freq: Four times a day (QID) | ORAL | 2 refills | Status: DC

## 2020-05-09 MED ORDER — CEFAZOLIN SODIUM-DEXTROSE 2-4 GM/100ML-% IV SOLN
2.0000 g | INTRAVENOUS | Status: AC
  Administered 2020-05-09: 2 g via INTRAVENOUS
  Filled 2020-05-09: qty 100

## 2020-05-09 MED ORDER — OXYCODONE HCL 5 MG/5ML PO SOLN: 5.0000 mg | Freq: Once | ORAL | Status: AC | PRN

## 2020-05-09 MED ORDER — BUPIVACAINE LIPOSOME 1.3 % IJ SUSP
INTRAMUSCULAR | Status: DC | PRN
  Administered 2020-05-09: 20 mL

## 2020-05-09 MED ORDER — HEPARIN SODIUM (PORCINE) 5000 UNIT/ML IJ SOLN
5000.0000 [IU] | Freq: Once | INTRAMUSCULAR | Status: AC
  Administered 2020-05-09: 5000 [IU] via SUBCUTANEOUS
  Filled 2020-05-09: qty 1

## 2020-05-09 MED ORDER — BUPIVACAINE-EPINEPHRINE 0.25% -1:200000 IJ SOLN
INTRAMUSCULAR | Status: DC | PRN
  Administered 2020-05-09: 20 mL

## 2020-05-09 MED ORDER — FENTANYL CITRATE (PF) 100 MCG/2ML IJ SOLN
INTRAMUSCULAR | Status: AC
  Filled 2020-05-09: qty 2

## 2020-05-09 MED ORDER — HYDROMORPHONE HCL 1 MG/ML IJ SOLN: 0.2500 mg | INTRAMUSCULAR | Status: DC | PRN

## 2020-05-09 MED ORDER — OXYCODONE HCL 5 MG PO TABS
5.0000 mg | ORAL_TABLET | Freq: Once | ORAL | Status: AC | PRN
  Administered 2020-05-09: 5 mg via ORAL

## 2020-05-09 MED ORDER — DEXAMETHASONE SODIUM PHOSPHATE 10 MG/ML IJ SOLN
INTRAMUSCULAR | Status: AC
  Filled 2020-05-09: qty 1

## 2020-05-09 MED ORDER — LIDOCAINE HCL (CARDIAC) PF 100 MG/5ML IV SOSY
PREFILLED_SYRINGE | INTRAVENOUS | Status: DC | PRN
  Administered 2020-05-09: 20 mg via INTRAVENOUS

## 2020-05-09 MED ORDER — BUPIVACAINE LIPOSOME 1.3 % IJ SUSP
INTRAMUSCULAR | Status: DC | PRN
  Administered 2020-05-09: 10 mL via PERINEURAL

## 2020-05-09 MED ORDER — KETOROLAC TROMETHAMINE 10 MG PO TABS: 10.0000 mg | ORAL_TABLET | Freq: Four times a day (QID) | ORAL | 0 refills | Status: DC | PRN

## 2020-05-09 MED ORDER — ONDANSETRON HCL 4 MG/2ML IJ SOLN
INTRAMUSCULAR | Status: AC
  Filled 2020-05-09: qty 2

## 2020-05-09 MED ORDER — ACETAMINOPHEN 500 MG PO TABS
1000.0000 mg | ORAL_TABLET | Freq: Once | ORAL | Status: AC
  Administered 2020-05-09: 1000 mg via ORAL
  Filled 2020-05-09: qty 2

## 2020-05-09 MED ORDER — BUPIVACAINE-EPINEPHRINE (PF) 0.25% -1:200000 IJ SOLN
INTRAMUSCULAR | Status: AC
  Filled 2020-05-09: qty 30

## 2020-05-09 MED ORDER — CHLORHEXIDINE GLUCONATE 0.12 % MT SOLN
15.0000 mL | Freq: Once | OROMUCOSAL | Status: AC
  Administered 2020-05-09: 15 mL via OROMUCOSAL
  Filled 2020-05-09: qty 15

## 2020-05-09 MED ORDER — PROPOFOL 10 MG/ML IV BOLUS
INTRAVENOUS | Status: DC | PRN
  Administered 2020-05-09: 20 mg via INTRAVENOUS
  Administered 2020-05-09: 100 mg via INTRAVENOUS

## 2020-05-09 MED ORDER — 0.9 % SODIUM CHLORIDE (POUR BTL) OPTIME
TOPICAL | Status: DC | PRN
  Administered 2020-05-09: 1000 mL

## 2020-05-09 MED ORDER — ONDANSETRON HCL 4 MG/2ML IJ SOLN
INTRAMUSCULAR | Status: DC | PRN
  Administered 2020-05-09: 4 mg via INTRAVENOUS

## 2020-05-09 MED ORDER — OXYCODONE HCL 5 MG PO TABS
ORAL_TABLET | ORAL | Status: AC
  Filled 2020-05-09: qty 1

## 2020-05-09 MED ORDER — DEXAMETHASONE SODIUM PHOSPHATE 10 MG/ML IJ SOLN
INTRAMUSCULAR | Status: DC | PRN
  Administered 2020-05-09: 10 mg via INTRAVENOUS

## 2020-05-09 SURGICAL SUPPLY — 49 items
ADH SKN CLS APL DERMABOND .7 (GAUZE/BANDAGES/DRESSINGS) ×1
APL PRP STRL LF DISP 70% ISPRP (MISCELLANEOUS) ×1
APL SKNCLS STERI-STRIP NONHPOA (GAUZE/BANDAGES/DRESSINGS)
BENZOIN TINCTURE PRP APPL 2/3 (GAUZE/BANDAGES/DRESSINGS) ×1 IMPLANT
BLADE CLIPPER SURG (BLADE) ×1 IMPLANT
CHLORAPREP W/TINT 26 (MISCELLANEOUS) ×2 IMPLANT
COVER SURGICAL LIGHT HANDLE (MISCELLANEOUS) ×2 IMPLANT
COVER WAND RF STERILE (DRAPES) ×1 IMPLANT
DERMABOND ADVANCED (GAUZE/BANDAGES/DRESSINGS) ×1
DERMABOND ADVANCED .7 DNX12 (GAUZE/BANDAGES/DRESSINGS) IMPLANT
DRAIN PENROSE 1/2X12 LTX STRL (WOUND CARE) ×1 IMPLANT
DRAPE LAPAROSCOPIC ABDOMINAL (DRAPES) IMPLANT
DRAPE LAPAROTOMY TRNSV 102X78 (DRAPES) ×1 IMPLANT
DRSG TEGADERM 4X4.75 (GAUZE/BANDAGES/DRESSINGS) ×1 IMPLANT
ELECT CAUTERY BLADE 6.4 (BLADE) ×1 IMPLANT
ELECT REM PT RETURN 9FT ADLT (ELECTROSURGICAL) ×2
ELECTRODE REM PT RTRN 9FT ADLT (ELECTROSURGICAL) ×1 IMPLANT
GAUZE 4X4 16PLY RFD (DISPOSABLE) ×2 IMPLANT
GAUZE SPONGE 4X4 12PLY STRL (GAUZE/BANDAGES/DRESSINGS) ×1 IMPLANT
GLOVE BIO SURGEON STRL SZ 6.5 (GLOVE) ×2 IMPLANT
GLOVE BIOGEL PI IND STRL 6 (GLOVE) ×1 IMPLANT
GLOVE BIOGEL PI INDICATOR 6 (GLOVE) ×1
GOWN STRL REUS W/ TWL LRG LVL3 (GOWN DISPOSABLE) ×2 IMPLANT
GOWN STRL REUS W/TWL LRG LVL3 (GOWN DISPOSABLE) ×4
KIT BASIN OR (CUSTOM PROCEDURE TRAY) ×2 IMPLANT
KIT TURNOVER KIT B (KITS) ×2 IMPLANT
MESH PARIETEX PROGRIP RIGHT (Mesh General) ×1 IMPLANT
NDL HYPO 25GX1X1/2 BEV (NEEDLE) ×1 IMPLANT
NEEDLE HYPO 25GX1X1/2 BEV (NEEDLE) ×2 IMPLANT
NS IRRIG 1000ML POUR BTL (IV SOLUTION) ×2 IMPLANT
PACK GENERAL/GYN (CUSTOM PROCEDURE TRAY) ×2 IMPLANT
PAD ARMBOARD 7.5X6 YLW CONV (MISCELLANEOUS) ×2 IMPLANT
PENCIL SMOKE EVACUATOR (MISCELLANEOUS) ×2 IMPLANT
SPONGE INTESTINAL PEANUT (DISPOSABLE) ×2 IMPLANT
SUT MNCRL AB 4-0 PS2 18 (SUTURE) ×2 IMPLANT
SUT NOVA NAB GS-21 0 18 T12 DT (SUTURE) ×1 IMPLANT
SUT PDS AB 0 CT 36 (SUTURE) IMPLANT
SUT SILK 0 SH 30 (SUTURE) ×2 IMPLANT
SUT SILK 2 0 SH (SUTURE) IMPLANT
SUT SILK 3 0 (SUTURE)
SUT SILK 3-0 18XBRD TIE 12 (SUTURE) IMPLANT
SUT VIC AB 0 CT2 27 (SUTURE) ×1 IMPLANT
SUT VIC AB 2-0 SH 27 (SUTURE) ×2
SUT VIC AB 2-0 SH 27X BRD (SUTURE) ×1 IMPLANT
SUT VIC AB 3-0 SH 27 (SUTURE) ×2
SUT VIC AB 3-0 SH 27XBRD (SUTURE) ×1 IMPLANT
SYR CONTROL 10ML LL (SYRINGE) ×2 IMPLANT
TOWEL GREEN STERILE (TOWEL DISPOSABLE) ×2 IMPLANT
TOWEL GREEN STERILE FF (TOWEL DISPOSABLE) ×2 IMPLANT

## 2020-05-09 NOTE — Discharge Instructions (Addendum)
May shower beginning 05/10/2020. Do not peel off or scrub skin glue. May allow warm soapy water to run over incision, then rinse and pat dry. Do not soak in any water (tubs, hot tubs, pools, lakes, oceans) for one week.   No lifting greater than 5 pounds for six weeks.   Call the office at [phone] for temperature greater than 101.15F, worsening pain, redness or warmth at the incision site.  Please call [phone] to make an appointment for 1 week after surgery for wound check.

## 2020-05-09 NOTE — Transfer of Care (Signed)
Immediate Anesthesia Transfer of Care Note  Patient: David [name]  Procedure(s) Performed: OPEN RIGHT INGUINAL HERNIA REPAIR (Right Inguinal) INSERTION OF MESH (Right Inguinal)  Patient Location: PACU  Anesthesia Type:General  Level of Consciousness: awake, alert  and oriented  Airway & Oxygen Therapy: Patient Spontanous Breathing and Patient connected to nasal cannula oxygen  Post-op Assessment: Report given to RN, Post -op Vital signs reviewed and stable and Patient moving all extremities  Post vital signs: Reviewed and stable  Last Vitals:  Vitals Value Taken Time  BP 152/91 05/09/20 1246  Temp 36.2 C 05/09/20 1246  Pulse 69 05/09/20 1248  Resp 12 05/09/20 1248  SpO2 98 % 05/09/20 1248  Vitals shown include unvalidated device data.  Last Pain:  Vitals:   05/09/20 1246  TempSrc:   PainSc: (P) 0-No pain      Patients Stated Pain Goal: 3 (10/13/13 5208)  Complications: No complications documented.

## 2020-05-09 NOTE — Addendum Note (Signed)
Addendum  created 05/09/20 1459 by Pervis Hocking, DO   Child order released for a procedure order, Clinical Note Signed, Intraprocedure Blocks edited

## 2020-05-09 NOTE — Op Note (Signed)
   Operative Note   Date: 05/09/2020  Procedure: ooen right inguinal hernia repair with mesh  Pre-op diagnosis: symptomatic right inguinal hernia, indirect Post-op diagnosis: same  Indication and clinical history: The patient is a 74 y.o. year old male with right groin pain and right inguinal hernia    Surgeon: [name], MD Assistant: Georgette Dover, MD  Anesthesiologist: Doroteo Glassman, MD Anesthesia: General  Findings:  . Specimen: none . EBL: <5cc . Drains/Implants: progrip right-sided mesh  Disposition: PACU - hemodynamically stable.  Description of procedure: The patient was positioned supine on the operating room table. General anesthetic induction and intubation were uneventful. Foley catheter insertion was performed and was atraumatic. The patient was prepped and draped in the usual sterile fashion. Time-out was performed verifying correct patient, procedure, signature of informed consent, and administration of pre-operative antibiotics.   A incision was made just cranial to the inguinal crease on the right and dissection carried down through Scarpa's fascia until the external oblique aponeurosis was encountered. The external oblique was incised sharply and the incision extended medially to the external ring and laterally beyond the internal ring, taking care to identify and preserve the ilioinguinal nerve. The spermatic cord was dissected bluntly and encircled with a penrose drain. The hernia sac was identified in an indirect location and dissected off the cord structures. The internal ring was buttressed with a figure of eight vicryl suture. The floor of the inguinal canal was buttressed with a running zero vicryl suture, taking care to again identify and preserve the ilioinguinal nerve. A right sided progrip mesh was then inserted and secured at the pubic tubercle and inferiorly at the shelving edge, again taking care to identify and preserve the ilioinguinal nerve. The internal ring  created by the mesh was also buttressed with a zero vicryl suture. The external oblique aponeurosis was reapproximated with 2-0 vicryl suture. Scarpa's fascia was reapproximated with 3-0 vicryl suture and the skin approximated with 4-0 monocryl suture. Dermabond was applied as sterile dressing.   All sponge and instrument counts were correct at the conclusion of the procedure. The patient was awakened from anesthesia, extubated uneventfully, and transported to the PACU in good condition. There were no complications.    [name], MD General and [facility]

## 2020-05-09 NOTE — Telephone Encounter (Signed)
Xolair Prefilled Syringe Order: 150mg  Prefilled Syringe:  #2 75mg  Prefilled Syringe: #N/A Ordered Date: 05/09/20 Expected date of arrival: 9/8-05/11/20 Ordered by: [place]: Nigel Mormon

## 2020-05-09 NOTE — Anesthesia Postprocedure Evaluation (Signed)
Anesthesia Post Note  Patient: David [name]  Procedure(s) Performed: OPEN RIGHT INGUINAL HERNIA REPAIR (Right Inguinal) INSERTION OF MESH (Right Inguinal)     Patient location during evaluation: PACU Anesthesia Type: Regional and General Level of consciousness: awake and alert, oriented and patient cooperative Pain management: pain level controlled Vital Signs Assessment: post-procedure vital signs reviewed and stable Respiratory status: spontaneous breathing, nonlabored ventilation and respiratory function stable Cardiovascular status: blood pressure returned to baseline and stable Postop Assessment: no apparent nausea or vomiting Anesthetic complications: no   No complications documented.  Last Vitals:  Vitals:   05/09/20 1246 05/09/20 1300  BP: (!) 152/91 (!) 153/92  Pulse: 67 (!) 55  Resp: 14 10  Temp: (!) 36.2 C   SpO2: 100% 98%    Last Pain:  Vitals:   05/09/20 1300  TempSrc:   PainSc: [place]

## 2020-05-09 NOTE — H&P (Addendum)
David [name] is an 74 y.o. male.   HPI: 73M with RIH presents for repair. The patient has had no hospitalizations, doctors visits, ER visits, surgeries, or newly diagnosed allergies since being seen in clinic. David [name] is his PCP. Followed by cardiology and has had a cardiac ablation.    Past Medical History:  Diagnosis Date  . Altitude sickness   . Asthma    exercise-induced; prn inhaler  . AV block, 2nd degree   . Bilateral inguinal hernia (BIH) 10/16/2012   Overview:  [facility] -Dr. Brantley Stage.  Observation for now 10/2012  . Complete heart block ([place]) 04/04/2018  . COVID-19   . DVT (deep venous thrombosis) ([place]) 08/13/2018   Right peroneal DVT 08/13/18, post right TKA 08/10/18  . Insomnia 07/14/2014  . Mobitz type 1 second degree atrioventricular block   . Nasal polyps   . OA (osteoarthritis) 09/24/2012  . 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 Reactive airway disease 09/24/2012  . Rotator cuff tear, right    impingement, AC arthrosis    Past Surgical History:  Procedure Laterality Date  . A-FLUTTER ABLATION N/A 02/24/2019   Procedure: A-FLUTTER ABLATION;  Surgeon: Constance Haw, MD;  Location: [place] [facility];  Service: Cardiovascular;  Laterality: N/A;  . BIOPSY  07/23/2019   Procedure: BIOPSY;  Surgeon: Carol Ada, MD;  [facility];  Service: Endoscopy;;  . CATARACT EXTRACTION W/ INTRAOCULAR LENS IMPLANT Right   . CATARACT EXTRACTION W/ INTRAOCULAR LENS IMPLANT Left    per patient about two years ago 2019  . COLONOSCOPY WITH PROPOFOL N/A 07/23/2019   Procedure: COLONOSCOPY WITH PROPOFOL;  Surgeon: Carol Ada, MD;  [facility];  Service: Endoscopy;  Laterality: N/A;  . LEFT HEART CATH AND CORONARY ANGIOGRAPHY N/A 04/06/2018   Procedure: LEFT HEART CATH AND CORONARY ANGIOGRAPHY;  Surgeon: Jettie Booze, MD;  Location: [place] [facility];  Service: Cardiovascular;  Laterality: N/A;  . LUMBAR LAMINECTOMY   age 22s  . NASAL SINUS SURGERY    . POLYPECTOMY  07/23/2019   Procedure: POLYPECTOMY;  Surgeon: Carol Ada, MD;  [facility];  Service: Endoscopy;;  . REFRACTIVE SURGERY    . ROTATOR CUFF REPAIR Right   . SHOULDER ARTHROSCOPY  08/06/2011   Procedure: ARTHROSCOPY SHOULDER;  Surgeon: Cammie Sickle., MD;  Location: [place];  Service: Orthopedics;  Laterality: Right;  with subacromial decompression, arthroscopic subscapularis repair, and rotator cuff repair  . TOTAL KNEE ARTHROPLASTY Right 08/10/2018   Procedure: TOTAL KNEE ARTHROPLASTY;  Surgeon: Frederik Pear, MD;  [facility];  Service: Orthopedics;  Laterality: Right;  . TRIGGER FINGER RELEASE Right 06/30/2014   Procedure: RELEASE A-1 PULLEY RIGHT THUMB;  Surgeon: Daryll Brod, MD;  Location: [place];  Service: Orthopedics;  Laterality: Right;  ANESTHESIA:  IV REGIONAL FAB    Family History  Problem Relation Age of Onset  . Heart Problems Mother        had pacemaker, lived to 83  . Alzheimer's disease Father   . Prostate cancer Father     Social History:  reports that he has never smoked. He has never used smokeless tobacco. He reports current alcohol use. He reports that he does not use drugs.  Allergies:  Allergies  Allergen Reactions  . Codeine Itching    Medications: I have reviewed the patient's current medications.  No results found for this or any previous visit (from the past 48 hour(s)).  No results found.  ROS 10 point review of systems is negative except as listed above in HPI.   Physical Exam Blood pressure (!) 188/82, pulse (!) 40, temperature 98.2 F (36.8 C), temperature source Oral, resp. rate 20, height 5\' 9"  (1.753 m), weight 72.7 kg, SpO2 100 %. Constitutional: well-developed, well-nourished HEENT: pupils equal, round, reactive to light, 60mm b/l, moist conjunctiva, external inspection of ears and nose normal, hearing intact Oropharynx: normal  oropharyngeal mucosa, normal dentition Neck: no thyromegaly, trachea midline, no midline cervical tenderness to palpation Chest: breath sounds equal bilaterally, normal respiratory effort, no midline or lateral chest wall tenderness to palpation/deformity Abdomen: soft, NT, no bruising, no hepatosplenomegaly GU: no blood at urethral meatus of penis, no scrotal masses or abnormality, RIH Back: no wounds, no thoracic/lumbar spine tenderness to palpation, no thoracic/lumbar spine stepoffs Rectal: deferred Extremities: 2+ radial and pedal pulses bilaterally, motor and sensation intact to bilateral UE and LE, no peripheral edema MSK: normal gait/station, no clubbing/cyanosis of fingers/toes, normal ROM of all four extremities Skin: warm, dry, no rashes Psych: normal memory, normal mood/affect    Assessment/Plan: 79M with R groin pain and RIH. To OR for open RIH. Informed consent was obtained after detailed explanation of risks, including bleeding, infection, hematoma/seroma, neuropathy, recurrence, and mesh infection requiring explantation. All questions answered to the patient's satisfaction.  Initial plan for spinal anesthesia due to pulmonary concerns, but change in anesthetic plan to general due to hypertensive urgency coupled with baseline bradycardia to 40s. Patient agreeable to change in anesthetic plan and was counseled to follow up with PCP due to hypertensive urgency. David [name], [phone], wife.    David Oka, MD General and [facility]

## 2020-05-09 NOTE — Anesthesia Procedure Notes (Signed)
Anesthesia Regional Block: TAP block   Pre-Anesthetic Checklist: ,, timeout performed, Correct Patient, Correct Site, Correct Laterality, Correct Procedure, Correct Position, site marked, Risks and benefits discussed, pre-op evaluation,  At surgeon's request and post-op pain management  Laterality: Right  Prep: Maximum Sterile Barrier Precautions used, chloraprep       Needles:  Injection technique: Single-shot  Needle Type: Echogenic Stimulator Needle     Needle Length: 9cm  Needle Gauge: 21     Additional Needles:   Narrative:  Start time: 05/09/2020 10:30 AM End time: 05/09/2020 10:35 AM Injection made incrementally with aspirations every 5 mL. Anesthesiologist: Pervis Hocking, DO  Additional Notes: 2% Lidocaine skin wheel.

## 2020-05-09 NOTE — Anesthesia Procedure Notes (Signed)
Procedure Name: Intubation Date/Time: 05/09/2020 11:31 AM Performed by: Gerhart Ruggieri T, CRNA Pre-anesthesia Checklist: Patient identified, Emergency Drugs available, Suction available and Patient being monitored Patient Re-evaluated:Patient Re-evaluated prior to induction Oxygen Delivery Method: Circle system utilized Preoxygenation: Pre-oxygenation with 100% oxygen Induction Type: IV induction Ventilation: Mask ventilation without difficulty Laryngoscope Size: Mac and 4 Grade View: Grade II Tube type: Oral Tube size: 7.5 mm Number of attempts: 1 Airway Equipment and Method: Stylet Placement Confirmation: ETT inserted through vocal cords under direct vision,  positive ETCO2 and breath sounds checked- equal and bilateral Secured at: 23 cm Tube secured with: Tape Dental Injury: Teeth and Oropharynx as per pre-operative assessment  Comments: Placed by Dyann Ruddle

## 2020-05-10 ENCOUNTER — Encounter (HOSPITAL_COMMUNITY): Payer: Self-pay | Admitting: Surgery

## 2020-05-10 NOTE — Telephone Encounter (Signed)
Xolair Prefilled Syringe Received:  150mg  Prefilled Syringe >> quantity #2, lot # [id number], exp date 05/02/2021 75mg  Prefilled Syringe >> quantity #N/A Medication arrival date: 05/10/20 Received by: Yoshino Broccoli,LPN

## 2020-05-18 ENCOUNTER — Other Ambulatory Visit: Payer: Self-pay

## 2020-05-18 ENCOUNTER — Ambulatory Visit (INDEPENDENT_AMBULATORY_CARE_PROVIDER_SITE_OTHER): Payer: Medicare Other

## 2020-05-18 DIAGNOSIS — Z7952 Long term (current) use of systemic steroids: Secondary | ICD-10-CM

## 2020-05-18 DIAGNOSIS — J4550 Severe persistent asthma, uncomplicated: Secondary | ICD-10-CM

## 2020-05-18 MED ORDER — OMALIZUMAB 150 MG/ML ~~LOC~~ SOSY
300.0000 mg | PREFILLED_SYRINGE | Freq: Once | SUBCUTANEOUS | Status: AC
  Administered 2020-05-18: 300 mg via SUBCUTANEOUS

## 2020-05-18 NOTE — Progress Notes (Signed)
Have you been hospitalized within the last 10 days?  No Do you have a fever?  No Do you have a cough?  No Do you have a headache or sore throat? No Do you have your Epi Pen visible and is it within date?  Yes 

## 2020-05-29 ENCOUNTER — Telehealth: Payer: Self-pay | Admitting: Critical Care Medicine

## 2020-05-29 NOTE — Telephone Encounter (Signed)
Xolair Prefilled Syringe Order: 150mg  Prefilled Syringe:  #2 75mg  Prefilled Syringe: #n/a Ordered Date: 05/29/20 Expected date of arrival: 05/31/20 Ordered by: [place]: Nigel Mormon

## 2020-05-30 DIAGNOSIS — Z23 Encounter for immunization: Secondary | ICD-10-CM | POA: Diagnosis not present

## 2020-06-01 NOTE — Telephone Encounter (Signed)
Xolair Prefilled Syringe Received:  150mg  Prefilled Syringe >> quantity #2, lot # [id number], exp date 05/02/2021 75mg  Prefilled Syringe >> quantity #na Medication arrival date: 06/01/20 Received by: Randel Hargens,LPN

## 2020-06-12 ENCOUNTER — Encounter: Payer: Self-pay | Admitting: Critical Care Medicine

## 2020-06-12 ENCOUNTER — Ambulatory Visit (INDEPENDENT_AMBULATORY_CARE_PROVIDER_SITE_OTHER): Payer: Medicare Other | Admitting: Critical Care Medicine

## 2020-06-12 ENCOUNTER — Other Ambulatory Visit: Payer: Self-pay

## 2020-06-12 VITALS — BP 140/80 | HR 62 | Temp 97.2°F | Ht 69.0 in | Wt 165.2 lb

## 2020-06-12 DIAGNOSIS — J4550 Severe persistent asthma, uncomplicated: Secondary | ICD-10-CM | POA: Diagnosis not present

## 2020-06-12 MED ORDER — ALBUTEROL SULFATE HFA 108 (90 BASE) MCG/ACT IN AERS: 2.0000 | INHALATION_SPRAY | RESPIRATORY_TRACT | 1 refills | Status: DC | PRN

## 2020-06-12 MED ORDER — TRELEGY ELLIPTA 200-62.5-25 MCG/INH IN AEPB: 1.0000 | INHALATION_SPRAY | Freq: Every day | RESPIRATORY_TRACT | 11 refills | Status: DC

## 2020-06-12 NOTE — Patient Instructions (Addendum)
Thank you for visiting Dr. Carlis Abbott at [facility]. We recommend the following:  Stop Symbicort when you start Trelegy, which is only once daily.  Meds ordered this encounter  Medications  . Fluticasone-Umeclidin-Vilant (TRELEGY ELLIPTA) 200-62.5-25 MCG/INH AEPB    Sig: Inhale 1 puff into the lungs daily.    Dispense:  1 each    Refill:  11  . albuterol (VENTOLIN HFA) 108 (90 Base) MCG/ACT inhaler    Sig: Inhale 2 puffs into the lungs every 4 (four) hours as needed for wheezing or shortness of breath.    Dispense:  8 g    Refill:  1    Return in about 3 months (around 09/12/2020). with Dr. Shearon Stalls (30 minute visit).    Please do your part to reduce the spread of COVID-19.

## 2020-06-12 NOTE — Progress Notes (Signed)
Synopsis: Referred in January 2021 for asthma by David [name], *.  Subjective:   PATIENT ID: David [name] GENDER: male DOB: [birth year], MRN: [id number]  Chief Complaint  Patient presents with  . Follow-up    Patient feels like Xolair is not working well over the last 3 months wants to see about changing dose or switch to something else. Increased shortness of breath, wheezing, cough. Symbicort makes him cough yellpw sputum    David [name] is a 74 y/o gentleman with a history of chronic asthma who is currently on Xolair injections for severe persistent asthma.  When he started Xolair he discontinued his LABA-ICS inhalers.  He dislikes using these because he does not like taking medications on a regular basis and does not like that he coughs up more mucus throughout the day after using it.  He felt like it took several months to achieve maximum effect from Xolair but only has had 1 good month on Xolair, and subsequently has had worsening of his symptoms again.  He does not think this is related to stopping his LABA-ICS as this was done the first month.  He has put himself back on prednisone very frequently to try and control his symptoms.  He has tried Advair in the past but did not feel that it improved his symptoms.  Symbicort helps his wheezing but he dislikes the coughing.  Recently underwent hernia surgery on 05/09/2020.  Has follow-up with Dr. Bobbye [name] later this week.  He follows with [facility] orthopedic surgery for chronic hip pain.  He mentioned that he had arthritis and "something else" seen on his most recent x-ray.    OV 09/29/19: David [name] is a 74 year old gentleman referred for evaluation of chronic asthma and wheezing.  He began having wheezing with activity in his 36s, but never had childhood asthma.  He was originally on Advair, but switched to Symbicort to take part in a clinical trial.  Doing peak flow meter testing during the clinical trial he had bronchodilator  reversibility, and continued on Symbicort.  He has no childhood history of asthma and has minimal allergies to mold.  His wheezing does not significantly limit his activity; he continues to exercise 3 days/week and is very active.  He has chronic mucus production in the morning and later in the day, which he describes as thick and solid, yellow, and sometimes difficult to clear.  There is no previous family history of lung disease or eczema.  He denies allergic rhinitis, postnasal drip, rashes.  They have 1 pet dog, which is a nonshedding breed.  He never uses albuterol does not have an inhaler.  He tried Singulair in the past without benefit.  He never has nighttime symptoms.  He had Covid 19 in August, but not significantly ill nor did he have persistent symptoms.  He has received his first dose of his Covid vaccine.  He was never a regular smoker.  He has a history of nasal polyps requiring surgery in the past, but has no issues taking aspirin or NSAIDs.  No fever, chills, sweats, weight loss.    Lab Results  Component Value Date   NITRICOXIDE 66 05/29/2016    Past Medical History:  Diagnosis Date  . Altitude sickness   . Asthma    exercise-induced; prn inhaler  . AV block, 2nd degree   . Bilateral inguinal hernia (BIH) 10/16/2012   Overview:  [facility] -Dr. Brantley Stage.  Observation for now 10/2012  . 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 COVID-19   . DVT (deep venous thrombosis) ([place]) 08/13/2018   Right peroneal DVT 08/13/18, post right TKA 08/10/18  . Insomnia 07/14/2014  . Mobitz type 1 second degree atrioventricular block   . Nasal polyps   . OA (osteoarthritis) 09/24/2012  . 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 Reactive airway disease 09/24/2012  . Rotator cuff tear, right    impingement, AC arthrosis     Family History  Problem Relation Age of Onset  . Heart Problems Mother        had pacemaker, lived to 30  . Alzheimer's disease Father   . Prostate cancer  Father      Past Surgical History:  Procedure Laterality Date  . A-FLUTTER ABLATION N/A 02/24/2019   Procedure: A-FLUTTER ABLATION;  Surgeon: Constance Haw, MD;  Location: [place] [facility];  Service: Cardiovascular;  Laterality: N/A;  . BIOPSY  07/23/2019   Procedure: BIOPSY;  Surgeon: Carol Ada, MD;  Location: [facility];  Service: Endoscopy;;  . CATARACT EXTRACTION W/ INTRAOCULAR LENS IMPLANT Right   . CATARACT EXTRACTION W/ INTRAOCULAR LENS IMPLANT Left    per patient about two years ago 2019  . COLONOSCOPY WITH PROPOFOL N/A 07/23/2019   Procedure: COLONOSCOPY WITH PROPOFOL;  Surgeon: Carol Ada, MD;  Location: [facility];  Service: Endoscopy;  Laterality: N/A;  . INGUINAL HERNIA REPAIR Right 05/09/2020   Procedure: OPEN RIGHT INGUINAL HERNIA REPAIR;  Surgeon: [name], MD;  Location: [place];  Service: General;  Laterality: Right;  . INSERTION OF MESH Right 05/09/2020   Procedure: INSERTION OF MESH;  Surgeon: [name], MD;  Location: [place];  Service: General;  Laterality: Right;  . LEFT HEART CATH AND CORONARY ANGIOGRAPHY N/A 04/06/2018   Procedure: LEFT HEART CATH AND CORONARY ANGIOGRAPHY;  Surgeon: Jettie Booze, MD;  Location: [place] [facility];  Service: Cardiovascular;  Laterality: N/A;  . LUMBAR LAMINECTOMY  age 74s  . NASAL SINUS SURGERY    . POLYPECTOMY  07/23/2019   Procedure: POLYPECTOMY;  Surgeon: Carol Ada, MD;  [facility];  Service: Endoscopy;;  . REFRACTIVE SURGERY    . ROTATOR CUFF REPAIR Right   . SHOULDER ARTHROSCOPY  08/06/2011   Procedure: ARTHROSCOPY SHOULDER;  Surgeon: Cammie Sickle., MD;  Location: [place];  Service: Orthopedics;  Laterality: Right;  with subacromial decompression, arthroscopic subscapularis repair, and rotator cuff repair  . TOTAL KNEE ARTHROPLASTY Right 08/10/2018   Procedure: TOTAL KNEE ARTHROPLASTY;  Surgeon: Frederik Pear, MD;  Location[facility];  Service: Orthopedics;   Laterality: Right;  . TRIGGER FINGER RELEASE Right 06/30/2014   Procedure: RELEASE A-1 PULLEY RIGHT THUMB;  Surgeon: Daryll Brod, MD;  Location: [place];  Service: Orthopedics;  Laterality: Right;  ANESTHESIA:  IV REGIONAL FAB    Social History   Socioeconomic History  . Marital status: Married    Spouse name: Not on file  . Number of children: Not on file  . Years of education: Not on file  . Highest education level: Not on file  Occupational History  . Not on file  Tobacco Use  . Smoking status: Never Smoker  . Smokeless tobacco: Never Used  Vaping Use  . Vaping Use: Never used  Substance and Sexual Activity  . Alcohol use: Yes    Comment: occ. beer  . Drug use: No  . Sexual activity: Not on file  Other Topics Concern  .  Not on file  Social History Narrative  . Not on file   Social Determinants of Health   Financial Resource Strain:   . Difficulty of Paying Living Expenses: Not on file  Food Insecurity:   . Worried About Charity fundraiser in the Last Year: Not on file  . Ran Out of Food in the Last Year: Not on file  Transportation Needs:   . Lack of Transportation (Medical): Not on file  . Lack of Transportation (Non-Medical): Not on file  Physical Activity:   . Days of Exercise per Week: Not on file  . Minutes of Exercise per Session: Not on file  Stress:   . Feeling of Stress : Not on file  Social Connections:   . Frequency of Communication with Friends and Family: Not on file  . Frequency of Social Gatherings with Friends and Family: Not on file  . Attends Religious Services: Not on file  . Active Member of Clubs or Organizations: Not on file  . Attends Archivist Meetings: Not on file  . Marital Status: Not on file  Intimate Partner Violence:   . Fear of Current or Ex-Partner: Not on file  . Emotionally Abused: Not on file  . Physically Abused: Not on file  . Sexually Abused: Not on file     Allergies  Allergen Reactions   . Codeine Itching     Immunization History  Administered Date(s) Administered  . DTaP 04/06/1996  . Influenza Split 08/02/2012, 07/05/2013  . Influenza Whole 07/03/2016  . Influenza-Unspecified 06/16/2019  . PFIZER SARS-COV-2 Vaccination 09/22/2019, 10/13/2019, 06/06/2020  . Pneumococcal Conjugate-13 08/31/2008, 05/23/2016  . Pneumococcal Polysaccharide-23 11/02/2013  . Tdap 09/25/2007, 08/31/2008  . Zoster 08/31/2008    Outpatient Medications Prior to Visit  Medication Sig Dispense Refill  . albuterol (VENTOLIN HFA) 108 (90 Base) MCG/ACT inhaler Inhale 2 puffs into the lungs every 6 (six) hours as needed for wheezing or shortness of breath. (Patient not taking: Reported on 04/27/2020) 8 g 1  . aspirin EC 81 MG tablet Take 81 mg by mouth daily. Swallow whole.    . eszopiclone (LUNESTA) 2 MG TABS tablet Take 2 mg by mouth as needed. (Patient not taking: Reported on 04/27/2020)    . Multiple Vitamin (MULTIVITAMIN WITH MINERALS) TABS tablet Take 1 tablet by mouth daily.    Marland Kitchen omalizumab Arvid Right) 75 MG/0.5ML prefilled syringe Inject 75 mg into the skin every 28 (twenty-eight) days.    Marland Kitchen acetaminophen (TYLENOL) 500 MG tablet Take 2 tablets (1,000 mg total) by mouth every 6 (six) hours. Alternate each dose with toradol (ketorolac) 100 tablet 2  . ketorolac (TORADOL) 10 MG tablet Take 1 tablet (10 mg total) by mouth every 6 (six) hours as needed. Alternate each dose with tylenol (acetaminophen) 20 tablet 0  . predniSONE (DELTASONE) 10 MG tablet Take 10 mg by mouth daily with breakfast. (Patient not taking: Reported on 04/27/2020)     No facility-administered medications prior to visit.    Review of Systems  Constitutional: Negative for chills, fever and weight loss.  HENT: Negative for congestion.   Respiratory: Positive for cough, sputum production and wheezing.   Cardiovascular: Negative for chest pain.  Endo/Heme/Allergies: Positive for environmental allergies.     Objective:    Vitals:   06/12/20 0938  BP: 140/80  Pulse: 62  Temp: (!) 97.2 F (36.2 C)  TempSrc: Temporal  SpO2: 98%  Weight: 165 lb 3.2 oz (74.9 kg)  Height: 5\' 9"  (1.753 m)  98% on  RA BMI Readings from Last 3 Encounters:  06/12/20 24.40 kg/m  05/09/20 23.67 kg/m  05/02/20 23.67 kg/m   Wt Readings from Last 3 Encounters:  06/12/20 165 lb 3.2 oz (74.9 kg)  05/09/20 160 lb 4.4 oz (72.7 kg)  05/02/20 160 lb 4.8 oz (72.7 kg)    Physical Exam Vitals reviewed.  Constitutional:      General: He is not in acute distress.    Appearance: Normal appearance. He is not ill-appearing.  HENT:     Head: Normocephalic and atraumatic.  Eyes:     General: No scleral icterus. Cardiovascular:     Rate and Rhythm: Normal rate and regular rhythm.     Heart sounds: No murmur heard.   Pulmonary:     Comments: Breathing comfortably on room air, no conversational dyspnea.  No observed coughing.  Expiratory wheezing, more pronounced on the left. Abdominal:     General: There is no distension.     Palpations: Abdomen is soft.  Musculoskeletal:        General: No swelling or deformity.     Cervical back: Neck supple.  Lymphadenopathy:     Cervical: No cervical adenopathy.  Skin:    General: Skin is warm and dry.     Findings: No rash.  Neurological:     General: No focal deficit present.     Mental Status: He is alert.     Coordination: Coordination normal.  Psychiatric:        Mood and Affect: Mood normal.        Behavior: Behavior normal.      CBC    Component Value Date/Time   WBC 6.4 05/02/2020 1327   RBC 4.76 05/02/2020 1327   HGB 15.4 05/02/2020 1327   HGB 14.6 11/06/2018 1257   HCT 47.1 05/02/2020 1327   HCT 45.9 11/06/2018 1257   PLT 222 05/02/2020 1327   PLT 201 11/06/2018 1257   MCV 98.9 05/02/2020 1327   MCV 95 11/06/2018 1257   MCH 32.4 05/02/2020 1327   MCHC 32.7 05/02/2020 1327   RDW 14.3 05/02/2020 1327   RDW 13.0 11/06/2018 1257   LYMPHSABS 1.5 09/29/2019  1427   MONOABS 0.4 09/29/2019 1427   EOSABS 0.6 09/29/2019 1427   BASOSABS 0.0 09/29/2019 1427    CHEMISTRY No results for input(s): NA, K, CL, CO2, GLUCOSE, BUN, CREATININE, CALCIUM, MG, PHOS in the last 168 hours. CrCl cannot be calculated (Patient's most recent lab result is older than the maximum 21 days allowed.).  IgE 297   Chest Imaging- films reviewed: CT abdomen pelvis 12/06/2018, lung images reviewed-limited due to respiratory motion, mild right retrocardiac linear opacity  CTA chest 04/05/2018- Mild bibasilar dependent opacities.  No significant mediastinal or hilar adenopathy.  No PE.  No bronchiectasis or concern for chronic pulmonary infection.  Pulmonary Functions Testing Results: PFT Results Latest Ref Rng & Units 10/18/2019  FVC-Pre L 3.65  FVC-Predicted Pre % 91  FVC-Post L 3.73  FVC-Predicted Post % 93  Pre FEV1/FVC % % 62  Post FEV1/FCV % % 66  FEV1-Pre L 2.27  FEV1-Predicted Pre % 78  FEV1-Post L 2.46  DLCO uncorrected ml/min/mmHg 26.60  DLCO UNC% % 111  DLVA Predicted % 101  TLC L 6.85  TLC % Predicted % 104  RV % Predicted % 128    2021-moderate obstruction without significant bronchodilator reversibility air trapping without hyperinflation.  Normal diffusion.  Flow volume loop suggests obstruction.  Echocardiogram 04/05/2018: LVEF  55 to 60%, no regional wall motion abnormalities.  Normal LA, mildly dilated RV, normal RA.  Normal PASP.  Trivial TR, otherwise normal valves.  Heart Catheterization 2019: EF 55 to 65%, normal LV function and normal LVEDP.  No aortic stenosis, no significant coronary artery disease.    Assessment & Plan:     ICD-10-CM   1. Severe persistent allergic asthma  J45.50     Moderate persistent allergic asthma- elevated IgE -Discussed the importance of avoiding long-term prednisone use.  I worry about long-term risk of avascular necrosis in his hips given his lifetime use of prednisone (x-ray did not show this at this time per  report).  It is much preferable to use ICS to systemic corticosteroids. -Recommend switching from Xolair to [place].  Given the timeframe that it will require to make this transition with insurance approval, recommend that he complete his prescribed Xolair prescription this week. -Start Trelegy 1 puff once daily. -Continue albuterol as needed -Recommend seasonal flu vaccine, Covid booster.  Up-to-date on pneumococcal vaccinations. -Continue Covid precautions-social distancing, mask wearing, handwashing.   RTC in 3 months with Dr. Shearon Stalls.   Current Outpatient Medications:  .  albuterol (VENTOLIN HFA) 108 (90 Base) MCG/ACT inhaler, Inhale 2 puffs into the lungs every 6 (six) hours as needed for wheezing or shortness of breath. (Patient not taking: Reported on 04/27/2020), Disp: 8 g, Rfl: 1 .  aspirin EC 81 MG tablet, Take 81 mg by mouth daily. Swallow whole., Disp: , Rfl:  .  eszopiclone (LUNESTA) 2 MG TABS tablet, Take 2 mg by mouth as needed. (Patient not taking: Reported on 04/27/2020), Disp: , Rfl:  .  Fluticasone-Umeclidin-Vilant (TRELEGY ELLIPTA) 200-62.5-25 MCG/INH AEPB, Inhale 1 puff into the lungs daily., Disp: 1 each, Rfl: 11 .  Multiple Vitamin (MULTIVITAMIN WITH MINERALS) TABS tablet, Take 1 tablet by mouth daily., Disp: , Rfl:  .  omalizumab (XOLAIR) 75 MG/0.5ML prefilled syringe, Inject 75 mg into the skin every 28 (twenty-eight) days., Disp: , Rfl:     Julian Hy, DO [facility] 06/12/2020 10:07 AM

## 2020-06-13 ENCOUNTER — Telehealth: Payer: Self-pay | Admitting: Pharmacy Technician

## 2020-06-13 NOTE — Telephone Encounter (Signed)
Received New start paperwork for [place]. Will update as we work through the benefits process.  Patient has traditional medicare/ supplement and Medicare part D plan. Due to patient's income in office injections likely most cost effective for patient. Sent signed enrollment forms in to get complete BIV form Dupixent Myway.  Submitted a Prior Authorization request to [facility] for [place] via Cover My Meds. Will update once we receive a response.   (KeyBerneice Heinrich) - [id number]

## 2020-06-14 NOTE — Telephone Encounter (Signed)
Received notification from [facility] regarding a prior authorization for [place]. Authorization has been APPROVED from 06/13/20 until further notice.   Authorization #  [id number]  Ran test claim for 1 month supply. Patient's copay is $1,024.24. Will await Dupixent Myway BIV for medical benefit BIV results.

## 2020-06-15 ENCOUNTER — Ambulatory Visit (INDEPENDENT_AMBULATORY_CARE_PROVIDER_SITE_OTHER): Payer: Medicare Other

## 2020-06-15 ENCOUNTER — Other Ambulatory Visit: Payer: Self-pay

## 2020-06-15 DIAGNOSIS — J4550 Severe persistent asthma, uncomplicated: Secondary | ICD-10-CM | POA: Diagnosis not present

## 2020-06-15 DIAGNOSIS — L111 Transient acantholytic dermatosis [Grover]: Secondary | ICD-10-CM | POA: Diagnosis not present

## 2020-06-15 DIAGNOSIS — L821 Other seborrheic keratosis: Secondary | ICD-10-CM | POA: Diagnosis not present

## 2020-06-15 DIAGNOSIS — L820 Inflamed seborrheic keratosis: Secondary | ICD-10-CM | POA: Diagnosis not present

## 2020-06-15 MED ORDER — OMALIZUMAB 150 MG/ML ~~LOC~~ SOSY
300.0000 mg | PREFILLED_SYRINGE | Freq: Once | SUBCUTANEOUS | Status: AC
  Administered 2020-06-15: 300 mg via SUBCUTANEOUS

## 2020-06-15 NOTE — Progress Notes (Signed)
Have you been hospitalized within the last 10 days?  No Do you have a fever?  No Do you have a cough?  No Do you have a headache or sore throat? No Do you have your Epi Pen visible and is it within date?  Yes 

## 2020-06-22 NOTE — Telephone Encounter (Signed)
Hi Dr. Carlis Abbott!  I spoke to [place] and received patient's benefits investigation:  Dupixent is only billed through the pharmacy benefit and patient's  secondary insurance will not pick up on pharmacy claims. Patient's Dupixent copay through his pharmacy benefit is $1,024.24 for a 1 month supply. Patient was receiving in-office Xolair for zero patient cost.  His secondary insurance will only assist with medical benefit claims. Also, patient's income is too high for patient assistance to assist with pharmacy claims.  Spoke to [employer] and he says based on patient's coverage, it would be most affordable for patient to receive an in-office alternative to maximize his coverage.  I did discuss with patient about the copay and he would also like to look into an alternative.    Thanks! Maziah Keeling

## 2020-06-22 NOTE — Telephone Encounter (Signed)
Unless there is a biosimilar to Xolair, then anti-IgE therapy is out.   His eosinophil total count in Jan 2021 was 600- can we try mepolizumab for him? If not, benrazlizumab.  Thanks!  LPC

## 2020-06-23 NOTE — Telephone Encounter (Signed)
Will start New Start paperwork for Office- Administered [place]. Will place in provider's box for signature.

## 2020-06-30 NOTE — Telephone Encounter (Signed)
Received signed [place] forms form provider. Faxed forms to [place] [facility] [place] for medical benefits investigation. Will update when we received a response.  Spoke to patient to update on New benefits investigation for [place].   Patient would like Dr. Carlis Abbott to know that he has been having good response since starting new inhaler- Trelegy, and he would like to hold off on switching biologics to see if the good response continues.  Please Advise.

## 2020-07-03 ENCOUNTER — Telehealth: Payer: Self-pay | Admitting: Critical Care Medicine

## 2020-07-03 NOTE — Telephone Encounter (Signed)
Xolair Prefilled Syringe Order: 150mg  Prefilled Syringe:  #2 75mg  Prefilled Syringe: #n/a Ordered Date: 07/03/20 Expected date of arrival: 07/04/20 Ordered by: [place]: Nigel Mormon

## 2020-07-04 NOTE — Telephone Encounter (Signed)
Xolair Prefilled Syringe Received:  150mg  Prefilled Syringe >> quantity #2, lot # [id number], exp date 08/01/2021 75mg  Prefilled Syringe >> quantity #n/a Medication arrival date: 07/04/20 Received by: Yaiden Yang,LPN

## 2020-07-13 ENCOUNTER — Ambulatory Visit (INDEPENDENT_AMBULATORY_CARE_PROVIDER_SITE_OTHER): Payer: Medicare Other

## 2020-07-13 ENCOUNTER — Other Ambulatory Visit: Payer: Self-pay

## 2020-07-13 DIAGNOSIS — J4550 Severe persistent asthma, uncomplicated: Secondary | ICD-10-CM | POA: Diagnosis not present

## 2020-07-13 DIAGNOSIS — Z20822 Contact with and (suspected) exposure to covid-19: Secondary | ICD-10-CM | POA: Diagnosis not present

## 2020-07-13 DIAGNOSIS — Z03818 Encounter for observation for suspected exposure to other biological agents ruled out: Secondary | ICD-10-CM | POA: Diagnosis not present

## 2020-07-13 MED ORDER — OMALIZUMAB 150 MG/ML ~~LOC~~ SOSY
300.0000 mg | PREFILLED_SYRINGE | Freq: Once | SUBCUTANEOUS | Status: AC
  Administered 2020-07-13: 300 mg via SUBCUTANEOUS

## 2020-07-13 NOTE — Progress Notes (Signed)
Have you been hospitalized within the last 10 days?  No Do you have a fever?  No Do you have a cough?  No Do you have a headache or sore throat? No Do you have your Epi Pen visible and is it within date?  Yes 

## 2020-07-31 ENCOUNTER — Telehealth: Payer: Self-pay | Admitting: Critical Care Medicine

## 2020-07-31 NOTE — Telephone Encounter (Signed)
Xolair Prefilled Syringe Order: 150mg  Prefilled Syringe:  #2 75mg  Prefilled Syringe: #n/a Ordered Date: 07/31/20 Expected date of arrival: 08/01/20 Ordered by: [place]: David [name]

## 2020-08-03 NOTE — Telephone Encounter (Signed)
Xolair Prefilled Syringe Received:  150mg  Prefilled Syringe >> quantity #2, lot # [id number], exp date 04/01/2021 75mg  Prefilled Syringe >> quantity #n/a Medication arrival date: 08/01/20 Received by: Maevyn Riordan,LPN

## 2020-08-08 DIAGNOSIS — M1611 Unilateral primary osteoarthritis, right hip: Secondary | ICD-10-CM | POA: Diagnosis not present

## 2020-08-08 DIAGNOSIS — Z471 Aftercare following joint replacement surgery: Secondary | ICD-10-CM | POA: Diagnosis not present

## 2020-08-08 DIAGNOSIS — Z96651 Presence of right artificial knee joint: Secondary | ICD-10-CM | POA: Diagnosis not present

## 2020-08-10 ENCOUNTER — Ambulatory Visit (INDEPENDENT_AMBULATORY_CARE_PROVIDER_SITE_OTHER): Payer: Medicare Other

## 2020-08-10 ENCOUNTER — Other Ambulatory Visit: Payer: Self-pay

## 2020-08-10 DIAGNOSIS — J4550 Severe persistent asthma, uncomplicated: Secondary | ICD-10-CM

## 2020-08-10 MED ORDER — OMALIZUMAB 150 MG/ML ~~LOC~~ SOSY
300.0000 mg | PREFILLED_SYRINGE | Freq: Once | SUBCUTANEOUS | Status: AC
  Administered 2020-08-10: 300 mg via SUBCUTANEOUS

## 2020-08-10 NOTE — Progress Notes (Signed)
Have you been hospitalized within the last 10 days?  No Do you have a fever?  No Do you have a cough?  No Do you have a headache or sore throat? No Do you have your Epi Pen visible and is it within date?  Yes 

## 2020-08-21 ENCOUNTER — Telehealth: Payer: Self-pay | Admitting: Critical Care Medicine

## 2020-08-21 NOTE — Telephone Encounter (Signed)
Xolair Prefilled Syringe Order: 150mg  Prefilled Syringe:  #2 75mg  Prefilled Syringe: #n/a Ordered Date: 08/21/20 Expected date of arrival: 08/22/20 Ordered by: [place]: Nigel Mormon

## 2020-08-22 ENCOUNTER — Telehealth: Payer: Self-pay | Admitting: Cardiology

## 2020-08-22 NOTE — Telephone Encounter (Signed)
   [facility] Pre-operative Risk Assessment    HEARTCARE STAFF: - Please ensure there is not already an duplicate clearance open for this procedure. - Under Visit Info/Reason for Call, type in Other and utilize the format Clearance MM/DD/YY or Clearance TBD. Do not use dashes or single digits. - If request is for dental extraction, please clarify the # of teeth to be extracted.  Request for surgical clearance:  1. What type of surgery is being performed? R Knee Arthroplasty   2. When is this surgery scheduled? 09/15/20  3. What type of clearance is required (medical clearance vs. Pharmacy clearance to hold med vs. Both)? Both  4. Are there any medications that need to be held prior to surgery and how long? Aspirin 5 days   5. Practice name and name of physician performing surgery? Dr. Frederik Pear, [place]   6. What is the office phone number? [phone]   7.   What is the office fax number? [phone]   8.   Anesthesia type (None, local, MAC, general) ? spinal   Johnna Acosta 08/22/2020, 4:44 PM  _________________________________________________________________   (provider comments below)

## 2020-08-22 NOTE — Telephone Encounter (Signed)
Xolair Prefilled Syringe Received:  150mg  Prefilled Syringe >> quantity #2, lot # [id number], exp date 03/31/2021 75mg  Prefilled Syringe >> quantity #n/a Medication arrival date: 08/22/20 Received by: Damarius Karnes,LPN

## 2020-08-23 NOTE — Telephone Encounter (Signed)
Primary [place], MD  Chart reviewed as part of pre-operative protocol coverage. Because of David [name]'s past medical history and time since last visit, he/she will require a follow-up visit in order to better assess preoperative cardiovascular risk.  Pre-op covering staff: - Please schedule appointment and call patient to inform them. - Please contact requesting surgeon's office via preferred method (i.e, phone, fax) to inform them of need for appointment prior to surgery.  If applicable, this message will also be routed to pharmacy pool and/or primary cardiologist for input on holding anticoagulant/antiplatelet agent as requested below so that this information is available at time of patient's appointment.   Deberah Pelton, NP  08/23/2020, 7:02 AM

## 2020-08-23 NOTE — Telephone Encounter (Signed)
CALLED PT AND SCHEDULED APPT 12-30 Pt states that this surgery is for his right hip not right knee

## 2020-08-29 DIAGNOSIS — Z01818 Encounter for other preprocedural examination: Secondary | ICD-10-CM | POA: Diagnosis not present

## 2020-08-29 DIAGNOSIS — Z79899 Other long term (current) drug therapy: Secondary | ICD-10-CM | POA: Diagnosis not present

## 2020-08-31 ENCOUNTER — Ambulatory Visit (INDEPENDENT_AMBULATORY_CARE_PROVIDER_SITE_OTHER): Payer: Medicare Other | Admitting: Cardiology

## 2020-08-31 ENCOUNTER — Encounter: Payer: Self-pay | Admitting: *Deleted

## 2020-08-31 ENCOUNTER — Encounter: Payer: Self-pay | Admitting: Cardiology

## 2020-08-31 ENCOUNTER — Other Ambulatory Visit: Payer: Self-pay

## 2020-08-31 VITALS — BP 142/92 | HR 39 | Ht 69.0 in | Wt 163.0 lb

## 2020-08-31 DIAGNOSIS — I483 Typical atrial flutter: Secondary | ICD-10-CM | POA: Diagnosis not present

## 2020-08-31 DIAGNOSIS — I442 Atrioventricular block, complete: Secondary | ICD-10-CM | POA: Diagnosis not present

## 2020-08-31 DIAGNOSIS — I441 Atrioventricular block, second degree: Secondary | ICD-10-CM | POA: Diagnosis not present

## 2020-08-31 NOTE — Progress Notes (Signed)
Electrophysiology Office Note   Date:  08/31/2020   ID:  David [name], DOB Feb 23, 1946, MRN [id number]  PCP:  Wayland Salinas, MD  Cardiologist:  Marlou Porch Primary Electrophysiologist:  Zymarion Favorite Meredith Leeds, MD    No chief complaint on file.    History of Present Illness: David [name] is a 74 y.o. male who is being seen today for the evaluation of bradycardia at the request of Ryter-Brown, Shyrl Numbers, *. Presenting today for electrophysiology evaluation.    He initially presented to the hospital with weight gain and lower extremity edema.  Echo showed a normal ejection fraction and left heart catheterization showed no evidence of coronary artery disease.  He was found to have Mobitz 1 AV block in 2-1 block while in the hospital.  Holter monitor showed a similar results.  He was noted to be in atrial flutter and is now status post atrial flutter ablation.  Today, denies symptoms of palpitations, chest pain, shortness of breath, orthopnea, PND, lower extremity edema, claudication, dizziness, presyncope, syncope, bleeding, or neurologic sequela. The patient is tolerating medications without difficulties.  He has a plan for right hip replacement scheduled January 14.  He currently feels well and is without complaint other than her right hip pain.  He is able to go to the gym, and states that when he is at the gym, his heart rate is in the 120s.  He otherwise has no complaints other than orthopedic issues.   Past Medical History:  Diagnosis Date  . Altitude sickness   . Asthma    exercise-induced; prn inhaler  . AV block, 2nd degree   . Bilateral inguinal hernia (BIH) 10/16/2012   Overview:  [facility] -Dr. Brantley Stage.  Observation for now 10/2012  . Complete heart block ([place]) 04/04/2018  . COVID-19   . DVT (deep venous thrombosis) ([place]) 08/13/2018   Right peroneal DVT 08/13/18, post right TKA 08/10/18  . Insomnia 07/14/2014  . Mobitz type 1 second degree atrioventricular  block   . Nasal polyps   . OA (osteoarthritis) 09/24/2012  . 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 Reactive airway disease 09/24/2012  . Rotator cuff tear, right    impingement, AC arthrosis   Past Surgical History:  Procedure Laterality Date  . A-FLUTTER ABLATION N/A 02/24/2019   Procedure: A-FLUTTER ABLATION;  Surgeon: Constance Haw, MD;  Location: [facility];  Service: Cardiovascular;  Laterality: N/A;  . BIOPSY  07/23/2019   Procedure: BIOPSY;  Surgeon: Carol Ada, MD;  Location: [facility];  Service: Endoscopy;;  . CATARACT EXTRACTION W/ INTRAOCULAR LENS IMPLANT Right   . CATARACT EXTRACTION W/ INTRAOCULAR LENS IMPLANT Left    per patient about two years ago 2019  . COLONOSCOPY WITH PROPOFOL N/A 07/23/2019   Procedure: COLONOSCOPY WITH PROPOFOL;  Surgeon: Carol Ada, MD;  Location: [facility];  Service: Endoscopy;  Laterality: N/A;  . INGUINAL HERNIA REPAIR Right 05/09/2020   Procedure: OPEN RIGHT INGUINAL HERNIA REPAIR;  Surgeon: [name], MD;  Location: [facility];  Service: General;  Laterality: Right;  . INSERTION OF MESH Right 05/09/2020   Procedure: INSERTION OF MESH;  Surgeon: [name], MD;  Location: [facility];  Service: General;  Laterality: Right;  . LEFT HEART CATH AND CORONARY ANGIOGRAPHY N/A 04/06/2018   Procedure: LEFT HEART CATH AND CORONARY ANGIOGRAPHY;  Surgeon: Jettie Booze, MD;  Location: [facility];  Service: Cardiovascular;  Laterality: N/A;  . LUMBAR LAMINECTOMY  age  31s  . NASAL SINUS SURGERY    . POLYPECTOMY  07/23/2019   Procedure: POLYPECTOMY;  Surgeon: Jeani Hawking, MD;  [facility]: [facility];  Service: Endoscopy;;  . REFRACTIVE SURGERY    . ROTATOR CUFF REPAIR Right   . SHOULDER ARTHROSCOPY  08/06/2011   Procedure: ARTHROSCOPY SHOULDER;  Surgeon: Wyn Forster., MD;  Location: [facility];  Service: Orthopedics;  Laterality: Right;  with subacromial decompression, arthroscopic subscapularis  repair, and rotator cuff repair  . TOTAL KNEE ARTHROPLASTY Right 08/10/2018   Procedure: TOTAL KNEE ARTHROPLASTY;  Surgeon: Gean Birchwood, MD;  Location: [facility];  Service: Orthopedics;  Laterality: Right;  . TRIGGER FINGER RELEASE Right 06/30/2014   Procedure: RELEASE A-1 PULLEY RIGHT THUMB;  Surgeon: Cindee Salt, MD;  Location: [facility];  Service: Orthopedics;  Laterality: Right;  ANESTHESIA:  IV REGIONAL FAB     Current Outpatient Medications  Medication Sig Dispense Refill  . albuterol (VENTOLIN HFA) 108 (90 Base) MCG/ACT inhaler Inhale 2 puffs into the lungs every 4 (four) hours as needed for wheezing or shortness of breath. 8 g 1  . eszopiclone (LUNESTA) 2 MG TABS tablet Take 2 mg by mouth as needed.    . Fluticasone-Umeclidin-Vilant (TRELEGY ELLIPTA) 200-62.5-25 MCG/INH AEPB Inhale 1 puff into the lungs daily. 1 each 11  . omalizumab (XOLAIR) 75 MG/0.5ML prefilled syringe Inject 75 mg into the skin every 28 (twenty-eight) days.     No current facility-administered medications for this visit.    Allergies:   Codeine   Social History:  The patient  reports that he has never smoked. He has never used smokeless tobacco. He reports current alcohol use. He reports that he does not use drugs.   Family History:  The patient's family history includes Alzheimer's disease in his father; Heart Problems in his mother; Prostate cancer in his father.   ROS:  Please see the history of present illness.   Otherwise, review of systems is positive for none.   All other systems are reviewed and negative.   PHYSICAL EXAM: VS:  BP (!) 142/92   Pulse (!) 39   Ht 5\' 9"  (1.753 m)   Wt 163 lb (73.9 kg)   SpO2 96%   BMI 24.07 kg/m  , BMI Body mass index is 24.07 kg/m. GEN: Well nourished, well developed, in no acute distress  HEENT: normal  Neck: no JVD, carotid bruits, or masses Cardiac: Bradycardic, regular; no murmurs, rubs, or gallops,no edema  Respiratory:  clear to auscultation  bilaterally, normal work of breathing GI: soft, nontender, nondistended, + BS MS: no deformity or atrophy  Skin: warm and dry Neuro:  Strength and sensation are intact Psych: euthymic mood, full affect  EKG:  EKG is ordered today. Personal review of the ekg ordered shows sinus rhythm, complete heart block with junctional escape  Recent Labs: 05/02/2020: BUN 30; Creatinine, Ser 1.13; Hemoglobin 15.4; Platelets 222; Potassium 4.6; Sodium 139    Lipid Panel     Component Value Date/Time   CHOL 141 04/05/2018 0019   TRIG 27 04/05/2018 0019   HDL 37 (L) 04/05/2018 0019   CHOLHDL 3.8 04/05/2018 0019   VLDL 5 04/05/2018 0019   LDLCALC 99 04/05/2018 0019     Wt Readings from Last 3 Encounters:  08/31/20 163 lb (73.9 kg)  06/12/20 165 lb 3.2 oz (74.9 kg)  05/09/20 160 lb 4.4 oz (72.7 kg)      Other studies Reviewed: Additional studies/ records that were  reviewed today include: LHC 04/06/18  Review of the above records today demonstrates:   The left ventricular systolic function is normal.  LV end diastolic pressure is normal.  The left ventricular ejection fraction is 55-65% by visual estimate.  There is no aortic valve stenosis.  No significant CAD.  TTE 04/05/18 - Left ventricle: The cavity size was normal. Wall thickness was   normal. Systolic function was normal. The estimated ejection   fraction was in the range of 55% to 60%. Wall motion was normal;   there were no regional wall motion abnormalities. - Aortic valve: Trileaflet; mildly thickened, mildly calcified   leaflets. - Right ventricle: The cavity size was mildly dilated. Wall   thickness was normal. - Tricuspid valve: There was trivial regurgitation.  Holter 04/20/18 - personally reviewed Minimum HR: 28 BPM at 7:36:27 AM(2) Maximum HR: 111 BPM at 9:36:54 AM(2) Average HR: 53 BPM 1% PVCs Sinus rhythm with mobitz I block, 2:1 AV block, and complete heart block during waking hours with narrow complex  QRS  ASSESSMENT AND PLAN:  1.  2-1 second-degree AV block: QRS complex is narrow.  Able to get his heart rates into the 120s.  Has been wanting to avoid pacemaker implant.  Likely an AV nodal level of block.  He is in complete heart block today.  To further evaluate his level of block, Javaughn Opdahl order an exercise treadmill test.  If he does conduct one-to-one and is able to get his heart rate up, he Zoltan Genest likely be able to avoid pacemaker implant.    2.  Typical appearing atrial flutter: Currently on Eliquis.  Status post ablation 02/24/2019.  Off of anticoagulation for a CHA2DS2-VASc of 1.  3.  Preoperative evaluation: Plan to have right hip surgery.  He is in complete heart block today with a narrow junctional escape.  His junctional escape has been stable over the past few years.  He has had second-degree AV block, but this is the first time that I have seen complete heart block.  Due to that, we Connie Hilgert order a treadmill exercise test.  If he does conduct one-to-one, and is able to get his heart rate elevated, would likely be at an intermediate risk for an intermediate risk procedure.  We Zakayla Martinec be able to further evaluate once his conduction system disease has been evaluated with a treadmill test.  Current medicines are reviewed at length with the patient today.   The patient does not have concerns regarding his medicines.  The following changes were made today: None  Labs/ tests ordered today include:  Orders Placed This Encounter  Procedures  . Cardiac Stress Test: Informed Consent Details: Physician/Practitioner Attestation; Transcribe to consent form and obtain patient signature  . Exercise Tolerance Test  . EKG 12-Lead    Disposition:   FU with Tyeson Tanimoto pending stress test months  Signed, Nicle Connole Meredith Leeds, MD  08/31/2020 12:08 PM     [address] [place] [address] [phone] (office) [phone] (fax)

## 2020-08-31 NOTE — Patient Instructions (Signed)
Medication Instructions:  Your physician recommends that you continue on your current medications as directed. Please refer to the Current Medication list given to you today.  *If you need a refill on your cardiac medications before your next appointment, please call your pharmacy*   Lab Work: None ordered   Testing/Procedures: Your physician has requested that you have an exercise tolerance test. For further information please visit [email or web address] Please also follow instruction sheet, as given.   Follow-Up: At [facility], you and your health needs are our priority.  As part of our continuing mission to provide you with exceptional heart care, we have created designated Provider Care Teams.  These Care Teams include your primary Cardiologist (physician) and Advanced Practice Providers (APPs -  Physician Assistants and Nurse Practitioners) who all work together to provide you with the care you need, when you need it.  Your next appointment:    to be determined (after stress testing)  The format for your next appointment:   In Person  Provider:   Loman Brooklyn, MD    Thank you for choosing [facility]!!   Dory Horn, RN [phone]   Other Instructions

## 2020-09-01 ENCOUNTER — Other Ambulatory Visit (HOSPITAL_COMMUNITY)
Admission: RE | Admit: 2020-09-01 | Discharge: 2020-09-01 | Disposition: A | Discharge status: Home / Self Care | Payer: Medicare Other | Source: Ambulatory Visit | Attending: Cardiology | Admitting: Cardiology

## 2020-09-01 DIAGNOSIS — Z01812 Encounter for preprocedural laboratory examination: Secondary | ICD-10-CM | POA: Diagnosis not present

## 2020-09-01 DIAGNOSIS — Z20822 Contact with and (suspected) exposure to covid-19: Secondary | ICD-10-CM | POA: Insufficient documentation

## 2020-09-01 LAB — SARS CORONAVIRUS 2 (TAT 6-24 HRS): SARS Coronavirus 2: NEGATIVE

## 2020-09-05 ENCOUNTER — Other Ambulatory Visit: Payer: Self-pay

## 2020-09-05 ENCOUNTER — Ambulatory Visit (INDEPENDENT_AMBULATORY_CARE_PROVIDER_SITE_OTHER): Payer: Medicare Other

## 2020-09-05 DIAGNOSIS — I442 Atrioventricular block, complete: Secondary | ICD-10-CM | POA: Diagnosis not present

## 2020-09-05 LAB — EXERCISE TOLERANCE TEST
Estimated workload: 12.8 METS
Exercise duration (min): 10 min
Exercise duration (sec): 47 s
MPHR: 147 {beats}/min
Peak HR: 117 {beats}/min
Percent HR: 79 %
RPE: 15
Rest HR: 40 {beats}/min

## 2020-09-07 ENCOUNTER — Ambulatory Visit (INDEPENDENT_AMBULATORY_CARE_PROVIDER_SITE_OTHER): Payer: Medicare Other

## 2020-09-07 ENCOUNTER — Other Ambulatory Visit: Payer: Self-pay

## 2020-09-07 DIAGNOSIS — M1611 Unilateral primary osteoarthritis, right hip: Secondary | ICD-10-CM | POA: Diagnosis not present

## 2020-09-07 DIAGNOSIS — J4550 Severe persistent asthma, uncomplicated: Secondary | ICD-10-CM | POA: Diagnosis not present

## 2020-09-07 DIAGNOSIS — M25571 Pain in right ankle and joints of right foot: Secondary | ICD-10-CM | POA: Diagnosis not present

## 2020-09-07 MED ORDER — OMALIZUMAB 150 MG/ML ~~LOC~~ SOSY
300.0000 mg | PREFILLED_SYRINGE | Freq: Once | SUBCUTANEOUS | Status: AC
  Administered 2020-09-07: 300 mg via SUBCUTANEOUS

## 2020-09-07 NOTE — Progress Notes (Signed)
Have you been hospitalized within the last 10 days?  No Do you have a fever?  No Do you have a cough?  No Do you have a headache or sore throat? No  

## 2020-09-07 NOTE — Telephone Encounter (Signed)
Left message to call back  

## 2020-09-07 NOTE — Telephone Encounter (Signed)
Lurena Joiner from [facility] calling to follow up on surgical clearance.

## 2020-09-07 NOTE — Telephone Encounter (Signed)
   Primary Cardiologist: Will [name], MD  Chart reviewed as part of pre-operative protocol coverage. Patient was contacted 09/07/2020 in reference to pre-operative risk assessment for pending surgery as outlined below.  David [name] was last seen on 08/31/20 by Dr. Elberta Fortis. At that time, it was recommended that he undergo an exercise stress test which was performed and reviewed by Dr. Elberta Fortis 09/05/20. Recommendations are below:  "Fortunately he conducts one-to-one at faster heart rates. He does not need a pacemaker unless he has symptoms of bradycardia such as weakness and fatigue. He is planned for surgery. He would be at intermediate to high risk for this intermediate risk procedure. I do not feel that pacemaker implant would be necessary prior to his surgery. I would, though, recommend surgery being performed at the hospital, in case there is a complication. Based on his stress test, with higher adrenergic tone, he does conduct one-to-one and thus no pacemaker is indicated unless as above he has symptoms."  The patient was advised that if he develops new symptoms prior to surgery to contact our office to arrange for a follow-up visit, and he verbalized understanding.  I will route this recommendation to the requesting party via Epic fax function and remove from pre-op pool. Please call with questions.  Georgie Chard, NP 09/07/2020, 10:34 AM

## 2020-09-08 ENCOUNTER — Other Ambulatory Visit: Payer: Self-pay | Admitting: Orthopedic Surgery

## 2020-09-08 DIAGNOSIS — Z01818 Encounter for other preprocedural examination: Secondary | ICD-10-CM

## 2020-09-11 ENCOUNTER — Other Ambulatory Visit: Payer: Self-pay

## 2020-09-11 ENCOUNTER — Ambulatory Visit (INDEPENDENT_AMBULATORY_CARE_PROVIDER_SITE_OTHER): Payer: Medicare Other | Admitting: Pulmonary Disease

## 2020-09-11 ENCOUNTER — Ambulatory Visit (INDEPENDENT_AMBULATORY_CARE_PROVIDER_SITE_OTHER): Payer: Medicare Other

## 2020-09-11 ENCOUNTER — Encounter: Payer: Self-pay | Admitting: Pulmonary Disease

## 2020-09-11 VITALS — BP 138/80 | HR 41 | Temp 97.2°F | Ht 69.0 in | Wt 168.0 lb

## 2020-09-11 DIAGNOSIS — J4550 Severe persistent asthma, uncomplicated: Secondary | ICD-10-CM

## 2020-09-11 DIAGNOSIS — Z7189 Other specified counseling: Secondary | ICD-10-CM

## 2020-09-11 DIAGNOSIS — I517 Cardiomegaly: Secondary | ICD-10-CM | POA: Diagnosis not present

## 2020-09-11 DIAGNOSIS — Z0000 Encounter for general adult medical examination without abnormal findings: Secondary | ICD-10-CM

## 2020-09-11 DIAGNOSIS — J9811 Atelectasis: Secondary | ICD-10-CM | POA: Diagnosis not present

## 2020-09-11 DIAGNOSIS — I442 Atrioventricular block, complete: Secondary | ICD-10-CM | POA: Diagnosis not present

## 2020-09-11 DIAGNOSIS — J45909 Unspecified asthma, uncomplicated: Secondary | ICD-10-CM | POA: Diagnosis not present

## 2020-09-11 NOTE — Assessment & Plan Note (Signed)
  Peri-operative Assessment of Pulmonary Risk for Non-Thoracic Surgery:  ForMr. [name], moderate risk of perioperative pulmonary complications is increased by:  Age greater than 65 years  Asthma    Respiratory complications generally occur in 1% of ASA Class I patients, 5% of ASA Class II and 10% of ASA Class III-IV patients These complications rarely result in mortality and iclude postoperative pneumonia, atelectasis, pulmonary embolism, ARDS and increased time requiring postoperative mechanical ventilation.  Overall, I recommend proceeding with the surgery if the risk for respiratory complications are outweighed by the potential benefits. This will need to be discussed between the patient and surgeon.  To reduce risks of respiratory complications, I recommend: --Smoking cessation 6-8 weeks if possible --Pre- and post-operative incentive spirometry performed frequently while awake --Avoiding use of pancuronium during anesthesia.  I have discussed the risk factors and recommendations above with the patient.

## 2020-09-11 NOTE — Assessment & Plan Note (Signed)
Patient up-to-date with vaccinations

## 2020-09-11 NOTE — Assessment & Plan Note (Signed)
ACT score today stable Doing well without regular use of maintenance inhaler Slight expiratory wheeze on physical exam today but patient asymptomatic  Plan: Continue Trelegy Ellipta inhaler daily as needed use at this point in time Reviewed with patient that likely will need to consider stepping down his maintenance inhaler at next follow-up visit Unfortunately patient was intolerant of Symbicort in the past, as this would have been a obvious choice for as needed use.  May need to consider as needed ICS or as needed ICS/LABA in the future  Tentatively will have patient utilize trilogy Ellipta inhaler 5 days prior to surgery as well as 5 days post surgery  Follow-up in 4 months to establish care with Dr. Shearon Stalls

## 2020-09-11 NOTE — Patient Instructions (Addendum)
You were seen today by Lauraine Rinne, NP  for:   1. 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  - DG Chest 2 View; Future  Trelegy Ellipta 200 >>> 1 puff daily in the morning >>>rinse mouth out after use  >>> This inhaler contains 3 medications that help manage her respiratory status, contact our office if you cannot afford this medication or unable to remain on this medication  Okay to continue to utilize this inhaler listed above on an as-needed daily basis given the stability of her symptoms  As discussed today would recommend utilizing it daily 5 days prior to your surgery in 5 days after your surgery  Likely will need to consider changing this inhaler in the future moving forward  Continue Xolair  Only use your albuterol as a rescue medication to be used if you can't catch your breath by resting or doing a relaxed purse lip breathing pattern.  - The less you use it, the better it will work when you need it. - Ok to use up to 2 puffs  every 4 hours if you must but call for immediate appointment if use goes up over your usual need - Don't leave home without it !!  (think of it like the spare tire for your car)   2. Surgical counseling visit  - DG Chest 2 View; Future  Peri-operative Assessment of Pulmonary Risk for Non-Thoracic Surgery:  ForMr. Grose, moderate risk of perioperative pulmonary complications is increased by:  Age greater than 65 years  Asthma    Respiratory complications generally occur in 1% of ASA Class I patients, 5% of ASA Class II and 10% of ASA Class III-IV patients These complications rarely result in mortality and iclude postoperative pneumonia, atelectasis, pulmonary embolism, ARDS and increased time requiring postoperative mechanical ventilation.  Overall, I recommend proceeding with the surgery if the risk for respiratory complications are outweighed by the potential benefits. This will need to be discussed between the patient and  surgeon.  To reduce risks of respiratory complications, I recommend: --Smoking cessation 6-8 weeks if possible --Pre- and post-operative incentive spirometry performed frequently while awake --Avoiding use of pancuronium during anesthesia.  I have discussed the risk factors and recommendations above with the patient.   3. Healthcare maintenance  Great job being up-to-date with your vaccinations   We recommend today:  Orders Placed This Encounter  Procedures  . DG Chest 2 View    Standing Status:   Future    Number of Occurrences:   1    Standing Expiration Date:   01/09/2021    Order Specific Question:   Reason for Exam (SYMPTOM  OR DIAGNOSIS REQUIRED)    Answer:   surgical clearence, asthma    Order Specific Question:   Preferred imaging location?    Answer:   Internal    Order Specific Question:   Radiology Contrast Protocol - do NOT remove file path    Answer:   [email or web address]   Orders Placed This Encounter  Procedures  . DG Chest 2 View   No orders of the defined types were placed in this encounter.   Follow Up:    No follow-ups on file.   Notification of test results are managed in the following manner: If there are  any recommendations or changes to the  plan of care discussed in office today,  we will contact you and let you know what they are. If you do not hear from [place], then your results are  normal and you can view them through your  MyChart account , or a letter will be sent to you. Thank you again for trusting [place] with your care  - Thank you, [facility]    It is flu season:   >>> Best ways to protect herself from the flu: Receive the yearly flu vaccine, practice good hand hygiene washing with soap and also using hand sanitizer when available, eat a nutritious meals, get adequate rest, hydrate appropriately       Please contact the office if your symptoms worsen or you have concerns that you  are not improving.   Thank you for choosing [facility] for your healthcare, and for allowing [place] to partner with you on your healthcare journey. I am thankful to be able to provide care to you today.   Wyn Quaker FNP-C

## 2020-09-11 NOTE — Assessment & Plan Note (Signed)
Plan: Continue follow-up with cardiology 

## 2020-09-11 NOTE — Progress Notes (Signed)
@Patient  ID: David [name], male    DOB: 08/10/1946, 76 y.o.   MRN: [id number]  Chief Complaint  Patient presents with  . Follow-up    Slight sob, occass. Wheezing, no cough. Surgical clearance    Referring provider: [name], *  HPI:  75 year old male never smoker followed in our office for asthma  PMH: Osteoarthritis, complete heart block Smoker/ Smoking History: Never smoker Maintenance: Trelegy Ellipta 200 Pt of: David [name], switching to David [name]  09/11/2020  - Visit   75 year old male never smoker followed in our office for severe persistent allergic asthma.  Established with David [name].  Last seen by David [name] in October/2021.  Plan of care at that point in time was as follows: Start Trelegy Ellipta, continue COVID precautions, switch from Xolair to [place], follow-up in 3 months to establish care with David [name]  Patient presenting to office today as a 46-month follow-up as well as a surgical clearance preparing to have right hip surgery with Dr. Mayer Camel.  This is currently scheduled for 10/02/2020.  Patient reports that he was never switched formally to [place].  He is still managed on Xolair.  He feels that this is going well.  He also reports that there were issues with insurance coverage for Dupixent.  Xolair is going so well that patient does not even need to utilize his Trelegy Ellipta inhaler regularly.  He reports he has to use this occasionally maybe once a week.  ACT score today reflects this improvement as it is 25.  Which is a significant improvement from where it was when it was last checked which was back in March/2021 when it was 13.  Questionaires / Pulmonary Flowsheets:   ACT:  Asthma Control Test ACT Total Score  09/11/2020 25  11/03/2019 13    MMRC: No flowsheet data found.  [facility]worth:  No flowsheet data found.  Tests:     FENO:  Lab Results  Component Value Date   NITRICOXIDE 66 05/29/2016    PFT: PFT Results Latest Ref Rng & Units  10/18/2019  FVC-Pre L 3.65  FVC-Predicted Pre % 91  FVC-Post L 3.73  FVC-Predicted Post % 93  Pre FEV1/FVC % % 62  Post FEV1/FCV % % 66  FEV1-Pre L 2.27  FEV1-Predicted Pre % 78  FEV1-Post L 2.46  DLCO uncorrected ml/min/mmHg 26.60  DLCO UNC% % 111  DLVA Predicted % 101  TLC L 6.85  TLC % Predicted % 104  RV % Predicted % 128    WALK:  No flowsheet data found.  Imaging: DG Chest 2 View  Result Date: 09/11/2020 CLINICAL DATA:  surgical clearence, asthma EXAM: CHEST - 2 VIEW COMPARISON:  04/04/2018 and prior. FINDINGS: Stable cardiomegaly. No pneumothorax or pleural effusion. Left basilar atelectasis. Multilevel spondylosis. IMPRESSION: No focal airspace disease.  Left basilar atelectasis. Stable cardiomegaly. Electronically Signed   By: David [name] M.D.   On: 09/11/2020 13:24   Exercise Tolerance Test  Result Date: 09/05/2020  Initial rhythm was complete heart block with junctional escape, rate 40 bpm  Excellent exercise capacity, achieved 12.8 METS  Did not reach target heart rate, though heart rate increased from baseline 40 bpm to peak 117 bpm (79% max predicted heart rate)  No evidence of ischemia, though study is inconclusive as target heart rate was not reached     Lab Results:  CBC    Component Value Date/Time   WBC 6.4 05/02/2020 1327   RBC 4.76 05/02/2020 1327  HGB 15.4 05/02/2020 1327   HGB 14.6 11/06/2018 1257   HCT 47.1 05/02/2020 1327   HCT 45.9 11/06/2018 1257   PLT 222 05/02/2020 1327   PLT 201 11/06/2018 1257   MCV 98.9 05/02/2020 1327   MCV 95 11/06/2018 1257   MCH 32.4 05/02/2020 1327   MCHC 32.7 05/02/2020 1327   RDW 14.3 05/02/2020 1327   RDW 13.0 11/06/2018 1257   LYMPHSABS 1.5 09/29/2019 1427   MONOABS 0.4 09/29/2019 1427   EOSABS 0.6 09/29/2019 1427   BASOSABS 0.0 09/29/2019 1427    BMET    Component Value Date/Time   NA 139 05/02/2020 1327   NA 138 11/06/2018 1257   K 4.6 05/02/2020 1327   CL 101 05/02/2020 1327   CO2 28  05/02/2020 1327   GLUCOSE 95 05/02/2020 1327   BUN 30 (H) 05/02/2020 1327   BUN 20 11/06/2018 1257   CREATININE 1.13 05/02/2020 1327   CALCIUM 9.5 05/02/2020 1327   GFRNONAA >60 05/02/2020 1327   GFRAA >60 05/02/2020 1327    BNP    Component Value Date/Time   BNP 301.3 (H) 04/04/2018 2139    ProBNP No results found for: PROBNP  Specialty Problems      Pulmonary Problems   Allergic asthma      Allergies  Allergen Reactions  . Codeine Itching    Immunization History  Administered Date(s) Administered  . DTaP 04/06/1996  . Fluad Quad(high Dose 65+) 07/03/2020  . Influenza Split 08/02/2012, 07/05/2013  . Influenza Whole 07/03/2016  . Influenza-Unspecified 06/16/2019  . PFIZER SARS-COV-2 Vaccination 09/22/2019, 10/13/2019, 06/06/2020  . Pneumococcal Conjugate-13 08/31/2008, 05/23/2016  . Pneumococcal Polysaccharide-23 11/02/2013  . Tdap 09/25/2007, 08/31/2008  . Zoster 08/31/2008    Past Medical History:  Diagnosis Date  . [place] sickness   . Asthma    exercise-induced; prn inhaler  . AV block, 2nd degree   . Bilateral inguinal hernia (BIH) 10/16/2012   Overview:  [facility] -Dr. Brantley Stage.  Observation for now 10/2012  . Complete heart block ([place]) 04/04/2018  . COVID-19   . DVT (deep venous thrombosis) ([place]) 08/13/2018   Right peroneal DVT 08/13/18, post right TKA 08/10/18  . Insomnia 07/14/2014  . Mobitz type 1 second degree atrioventricular block   . Nasal polyps   . OA (osteoarthritis) 09/24/2012  . 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 Reactive airway disease 09/24/2012  . Rotator cuff tear, right    impingement, AC arthrosis    Tobacco History: Social History   Tobacco Use  Smoking Status Never Smoker  Smokeless Tobacco Never Used   Counseling given: Not Answered   Continue to not smoke  Outpatient Encounter Medications as of 09/11/2020  Medication Sig  . albuterol (VENTOLIN HFA) 108 (90 Base) MCG/ACT inhaler Inhale 2 puffs  into the lungs every 4 (four) hours as needed for wheezing or shortness of breath.  . Fluticasone-Umeclidin-Vilant (TRELEGY ELLIPTA) 200-62.5-25 MCG/INH AEPB Inhale 1 puff into the lungs daily.  Marland Kitchen omalizumab Arvid Right) 75 MG/0.5ML prefilled syringe Inject 75 mg into the skin every 28 (twenty-eight) days.  . eszopiclone (LUNESTA) 2 MG TABS tablet Take 2 mg by mouth as needed.   No facility-administered encounter medications on file as of 09/11/2020.     Review of Systems  Review of Systems  Constitutional: Negative for activity change, chills, fatigue, fever and unexpected weight change.  HENT: Negative for postnasal drip, rhinorrhea, sinus pressure, sinus pain and sore throat.   Eyes: Negative.   Respiratory: Negative  for cough, shortness of breath and wheezing.   Cardiovascular: Negative for chest pain and palpitations.  Gastrointestinal: Negative for constipation, diarrhea, nausea and vomiting.  Endocrine: Negative.   Genitourinary: Negative.   Musculoskeletal: Negative.   Skin: Negative.   Neurological: Negative for dizziness and headaches.  Psychiatric/Behavioral: Negative.  Negative for dysphoric mood. The patient is not nervous/anxious.   All other systems reviewed and are negative.    Physical Exam  BP 138/80 (BP Location: Left Arm, Cuff Size: Normal)   Pulse (!) 41   Temp (!) 97.2 F (36.2 C) (Temporal)   Ht 5\' 9"  (1.753 m)   Wt 168 lb (76.2 kg)   SpO2 97%   BMI 24.81 kg/m   Wt Readings from Last 5 Encounters:  09/11/20 168 lb (76.2 kg)  08/31/20 163 lb (73.9 kg)  06/12/20 165 lb 3.2 oz (74.9 kg)  05/09/20 160 lb 4.4 oz (72.7 kg)  05/02/20 160 lb 4.8 oz (72.7 kg)    BMI Readings from Last 5 Encounters:  09/11/20 24.81 kg/m  08/31/20 24.07 kg/m  06/12/20 24.40 kg/m  05/09/20 23.67 kg/m  05/02/20 23.67 kg/m     Physical Exam Vitals and nursing note reviewed.  Constitutional:      General: He is not in acute distress.    Appearance: Normal  appearance. He is normal weight.  HENT:     Head: Normocephalic and atraumatic.     Right Ear: Hearing and external ear normal.     Left Ear: Hearing and external ear normal.     Nose: Nose normal. No mucosal edema or rhinorrhea.     Right Turbinates: Not enlarged.     Left Turbinates: Not enlarged.     Mouth/Throat:     Mouth: Mucous membranes are dry.     Pharynx: Oropharynx is clear. No oropharyngeal exudate.     Comments: Postnasal drip Eyes:     Pupils: Pupils are equal, round, and reactive to light.  Cardiovascular:     Rate and Rhythm: Normal rate and regular rhythm.     Pulses: Normal pulses.     Heart sounds: Normal heart sounds. No murmur heard.   Pulmonary:     Effort: Pulmonary effort is normal.     Breath sounds: Wheezing (Slight expiratory wheeze) present. No decreased breath sounds or rales.  Musculoskeletal:     Cervical back: Normal range of motion.     Right lower leg: No edema.     Left lower leg: No edema.  Lymphadenopathy:     Cervical: No cervical adenopathy.  Skin:    General: Skin is warm and dry.     Capillary Refill: Capillary refill takes less than 2 seconds.     Findings: No erythema or rash.  Neurological:     General: No focal deficit present.     Mental Status: He is alert and oriented to person, place, and time.     Motor: No weakness.     Coordination: Coordination normal.     Gait: Gait is intact. Gait normal.  Psychiatric:        Mood and Affect: Mood normal.        Behavior: Behavior normal. Behavior is cooperative.        Thought Content: Thought content normal.        Judgment: Judgment normal.       Assessment & Plan:   Complete heart block (HCC) Plan: Continue follow-up with cardiology  Allergic asthma ACT score today stable Doing  well without regular use of maintenance inhaler Slight expiratory wheeze on physical exam today but patient asymptomatic  Plan: Continue Trelegy Ellipta inhaler daily as needed use at this  point in time Reviewed with patient that likely will need to consider stepping down his maintenance inhaler at next follow-up visit Unfortunately patient was intolerant of Symbicort in the past, as this would have been a obvious choice for as needed use.  May need to consider as needed ICS or as needed ICS/LABA in the future  Tentatively will have patient utilize trilogy Ellipta inhaler 5 days prior to surgery as well as 5 days post surgery  Follow-up in 4 months to establish care with David [name]  Healthcare maintenance Patient up-to-date with vaccinations  Surgical counseling visit  Peri-operative Assessment of Pulmonary Risk for Non-Thoracic Surgery:  ForMr. Lamora, moderate risk of perioperative pulmonary complications is increased by:  Age greater than 65 years  Asthma    Respiratory complications generally occur in 1% of ASA Class I patients, 5% of ASA Class II and 10% of ASA Class III-IV patients These complications rarely result in mortality and iclude postoperative pneumonia, atelectasis, pulmonary embolism, ARDS and increased time requiring postoperative mechanical ventilation.  Overall, I recommend proceeding with the surgery if the risk for respiratory complications are outweighed by the potential benefits. This will need to be discussed between the patient and surgeon.  To reduce risks of respiratory complications, I recommend: --Smoking cessation 6-8 weeks if possible --Pre- and post-operative incentive spirometry performed frequently while awake --Avoiding use of pancuronium during anesthesia.  I have discussed the risk factors and recommendations above with the patient.     Return in about 4 months (around 01/09/2021), or if symptoms worsen or fail to improve, for Follow up with David [name], [employer].   Lauraine Rinne, NP 09/11/2020   This appointment required 32 minutes of patient care (this includes precharting, chart review, review of results, face-to-face  care, etc.).

## 2020-09-13 ENCOUNTER — Encounter: Payer: Self-pay | Admitting: *Deleted

## 2020-09-15 ENCOUNTER — Telehealth: Payer: Self-pay | Admitting: Pulmonary Disease

## 2020-09-15 NOTE — Telephone Encounter (Signed)
Our office no longer fills out surgical clearance forms.  Surgical risk assessment was filled out at office visit and documented in [place] note.   Left detailed message for Wells Guiles to make aware.  Faxed OV note with sx risk assessment documented.  Nothing further needed at this time- will close encounter.

## 2020-09-20 ENCOUNTER — Other Ambulatory Visit (HOSPITAL_COMMUNITY): Payer: Self-pay | Admitting: Orthopedic Surgery

## 2020-09-20 ENCOUNTER — Other Ambulatory Visit: Payer: Self-pay | Admitting: Orthopedic Surgery

## 2020-09-20 DIAGNOSIS — Z96651 Presence of right artificial knee joint: Secondary | ICD-10-CM

## 2020-09-20 DIAGNOSIS — M25561 Pain in right knee: Secondary | ICD-10-CM

## 2020-09-20 NOTE — Patient Instructions (Addendum)
DUE TO COVID-19 ONLY ONE VISITOR IS ALLOWED TO COME WITH YOU AND STAY IN THE WAITING ROOM ONLY DURING PRE OP AND PROCEDURE.   IF YOU WILL BE ADMITTED INTO THE HOSPITAL YOU ARE ALLOWED ONE SUPPORT PERSON DURING VISITATION HOURS ONLY (10AM -8PM)   . The support person may change daily. . The support person must pass our screening, gel in and out, and wear a mask at all times, including in the patient's room. . Patients must also wear a mask when staff or their support person are in the room.   COVID SWAB TESTING MUST BE COMPLETED ON:  Thursday, 09-28-20 @ 10:30 am    [address] [place], [place] [address]  (Must self quarantine after testing. Follow instructions on handout.)   Your procedure is scheduled on:  Monday, 10-02-20   Report to [facility] Main  Entrance    Report to admitting at 7:15 AM   Call this number if you have problems the morning of surgery [phone]   Do not eat food :After Midnight.   May have liquids until 6:45 AM  day of surgery  CLEAR LIQUID DIET  Foods Allowed                                                                     Foods Excluded  Water, Black Coffee and tea, regular and decaf              liquids that you cannot  Plain Jell-O in any flavor  (No red)                                    see through such as: Fruit ices (not with fruit pulp)                                      milk, soups, orange juice              Iced Popsicles (No red)                                      All solid food                                   Apple juices Sports drinks like Gatorade (No red) Lightly seasoned clear broth or consume(fat free) Sugar, honey syrup     Complete one Ensure drink the morning of surgery at  6:45 AM  the day of surgery.      1. The day of surgery:  ? Drink ONE (1) Pre-Surgery Clear Ensure or G2 by am the morning of surgery. Drink in one sitting. Do not sip.  ? This drink was given to you during your hospital  pre-op  appointment visit. ? Nothing else to drink after completing the  Pre-Surgery Clear Ensure or G2.          If you have questions, please contact  your surgeon's office.     Oral Hygiene is also important to reduce your risk of infection.                                    Remember - BRUSH YOUR TEETH THE MORNING OF SURGERY WITH YOUR REGULAR TOOTHPASTE   Do NOT smoke after Midnight   Take these medicines the morning of surgery with A SIP OF WATER: None.  Okay to use inhalers and bring with you day of surgery                               You may not have any metal on your body including jewelry, and body piercings             Do not wear lotions, powders, perfumes/cologne, or deodorant             Men may shave face and neck.   Do not bring valuables to the hospital. [place].   Contacts, dentures or bridgework may not be worn into surgery.   Patients discharged the day of surgery will not be allowed to drive home.   Special Instructions: Bring a copy of your healthcare power of attorney and living will documents         the day of surgery if you haven't scanned them in before.              Please read over the following fact sheets you were given: IF YOU HAVE QUESTIONS ABOUT YOUR PRE OP INSTRUCTIONS PLEASE CALL [phone]   [place] [facility] - Preparing for Surgery Before surgery, you can play an important role.  Because skin is not sterile, your skin needs to be as free of germs as possible.  You can reduce the number of germs on your skin by washing with CHG (chlorahexidine gluconate) soap before surgery.  CHG is an antiseptic cleaner which kills germs and bonds with the skin to continue killing germs even after washing. Please DO NOT use if you have an allergy to CHG or antibacterial soaps.  If your skin becomes reddened/irritated stop using the CHG and inform your nurse when you arrive at Short Stay. Do not shave (including legs and underarms) for at  least 48 hours prior to the first CHG shower.  You may shave your face/neck.  Please follow these instructions carefully:  1.  Shower with CHG Soap the night before surgery and the  morning of surgery.  2.  If you choose to wash your hair, wash your hair first as usual with your normal  shampoo.  3.  After you shampoo, rinse your hair and body thoroughly to remove the shampoo.                             4.  Use CHG as you would any other liquid soap.  You can apply chg directly to the skin and wash.  Gently with a scrungie or clean washcloth.  5.  Apply the CHG Soap to your body ONLY FROM THE NECK DOWN.   Do   not use on face/ open  Wound or open sores. Avoid contact with eyes, ears mouth and   genitals (private parts).                       Wash face,  Genitals (private parts) with your normal soap.             6.  Wash thoroughly, paying special attention to the area where your    surgery  will be performed.  7.  Thoroughly rinse your body with warm water from the neck down.  8.  DO NOT shower/wash with your normal soap after using and rinsing off the CHG Soap.                9.  Pat yourself dry with a clean towel.            10.  Wear clean pajamas.            11.  Place clean sheets on your bed the night of your first shower and do not  sleep with pets. Day of Surgery : Do not apply any lotions/deodorants the morning of surgery.  Please wear clean clothes to the hospital/surgery center.  FAILURE TO FOLLOW THESE INSTRUCTIONS MAY RESULT IN THE CANCELLATION OF YOUR SURGERY  PATIENT SIGNATURE_________________________________  NURSE SIGNATURE__________________________________  ________________________________________________________________________   David [name]  An incentive spirometer is a tool that can help keep your lungs clear and active. This tool measures how well you are filling your lungs with each breath. Taking long deep breaths may help  reverse or decrease the chance of developing breathing (pulmonary) problems (especially infection) following:  A long period of time when you are unable to move or be active. BEFORE THE PROCEDURE   If the spirometer includes an indicator to show your best effort, your nurse or respiratory therapist will set it to a desired goal.  If possible, sit up straight or lean slightly forward. Try not to slouch.  Hold the incentive spirometer in an upright position. INSTRUCTIONS FOR USE  1. Sit on the edge of your bed if possible, or sit up as far as you can in bed or on a chair. 2. Hold the incentive spirometer in an upright position. 3. Breathe out normally. 4. Place the mouthpiece in your mouth and seal your lips tightly around it. 5. Breathe in slowly and as deeply as possible, raising the piston or the ball toward the top of the column. 6. Hold your breath for 3-5 seconds or for as long as possible. Allow the piston or ball to fall to the bottom of the column. 7. Remove the mouthpiece from your mouth and breathe out normally. 8. Rest for a few seconds and repeat Steps 1 through 7 at least 10 times every 1-2 hours when you are awake. Take your time and take a few normal breaths between deep breaths. 9. The spirometer may include an indicator to show your best effort. Use the indicator as a goal to work toward during each repetition. 10. After each set of 10 deep breaths, practice coughing to be sure your lungs are clear. If you have an incision (the cut made at the time of surgery), support your incision when coughing by placing a pillow or rolled up towels firmly against it. Once you are able to get out of bed, walk around indoors and cough well. You may stop using the incentive spirometer when instructed by your caregiver.  RISKS AND COMPLICATIONS  Take your time  so you do not get dizzy or light-headed.  If you are in pain, you may need to take or ask for pain medication before doing incentive  spirometry. It is harder to take a deep breath if you are having pain. AFTER USE  Rest and breathe slowly and easily.  It can be helpful to keep track of a log of your progress. Your caregiver can provide you with a simple table to help with this. If you are using the spirometer at home, follow these instructions: [place] IF:   You are having difficultly using the spirometer.  You have trouble using the spirometer as often as instructed.  Your pain medication is not giving enough relief while using the spirometer.  You develop fever of 100.5 F (38.1 C) or higher. SEEK IMMEDIATE MEDICAL CARE IF:   You cough up bloody sputum that had not been present before.  You develop fever of 102 F (38.9 C) or greater.  You develop worsening pain at or near the incision site. MAKE SURE YOU:   Understand these instructions.  Will watch your condition.  Will get help right away if you are not doing well or get worse. Document Released: 12/30/2006 Document Revised: 11/11/2011 Document Reviewed: 03/02/2007 ExitCare Patient Information 2014 Exit[employer], [place].   ________________________________________________________________________  WHAT IS A BLOOD TRANSFUSION? Blood Transfusion Information  A transfusion is the replacement of blood or some of its parts. Blood is made up of multiple cells which provide different functions.  Red blood cells carry oxygen and are used for blood loss replacement.  White blood cells fight against infection.  Platelets control bleeding.  Plasma helps clot blood.  Other blood products are available for specialized needs, such as hemophilia or other clotting disorders. BEFORE THE TRANSFUSION  Who gives blood for transfusions?   Healthy volunteers who are fully evaluated to make sure their blood is safe. This is blood bank blood. Transfusion therapy is the safest it has ever been in the practice of medicine. Before blood is taken from a donor, a  complete history is taken to make sure that person has no history of diseases nor engages in risky social behavior (examples are intravenous drug use or sexual activity with multiple partners). The donor's travel history is screened to minimize risk of transmitting infections, such as malaria. The donated blood is tested for signs of infectious diseases, such as HIV and hepatitis. The blood is then tested to be sure it is compatible with you in order to minimize the chance of a transfusion reaction. If you or a relative donates blood, this is often done in anticipation of surgery and is not appropriate for emergency situations. It takes many days to process the donated blood. RISKS AND COMPLICATIONS Although transfusion therapy is very safe and saves many lives, the main dangers of transfusion include:   Getting an infectious disease.  Developing a transfusion reaction. This is an allergic reaction to something in the blood you were given. Every precaution is taken to prevent this. The decision to have a blood transfusion has been considered carefully by your caregiver before blood is given. Blood is not given unless the benefits outweigh the risks. AFTER THE TRANSFUSION  Right after receiving a blood transfusion, you will usually feel much better and more energetic. This is especially true if your red blood cells have gotten low (anemic). The transfusion raises the level of the red blood cells which carry oxygen, and this usually causes an energy increase.  The  nurse administering the transfusion will monitor you carefully for complications. HOME CARE INSTRUCTIONS  No special instructions are needed after a transfusion. You may find your energy is better. Speak with your caregiver about any limitations on activity for underlying diseases you may have. SEEK MEDICAL CARE IF:   Your condition is not improving after your transfusion.  You develop redness or irritation at the intravenous (IV)  site. SEEK IMMEDIATE MEDICAL CARE IF:  Any of the following symptoms occur over the next 12 hours:  Shaking chills.  You have a temperature by mouth above 102 F (38.9 C), not controlled by medicine.  Chest, back, or muscle pain.  People around you feel you are not acting correctly or are confused.  Shortness of breath or difficulty breathing.  Dizziness and fainting.  You get a rash or develop hives.  You have a decrease in urine output.  Your urine turns a dark color or changes to pink, red, or brown. Any of the following symptoms occur over the next 10 days:  You have a temperature by mouth above 102 F (38.9 C), not controlled by medicine.  Shortness of breath.  Weakness after normal activity.  The white part of the eye turns yellow (jaundice).  You have a decrease in the amount of urine or are urinating less often.  Your urine turns a dark color or changes to pink, red, or brown. Document Released: 08/16/2000 Document Revised: 11/11/2011 Document Reviewed: 04/04/2008 [facility] Patient Information 2014 [place], [place].  _______________________________________________________________________

## 2020-09-20 NOTE — Progress Notes (Signed)
COVID Vaccine Completed: x3  Date COVID Vaccine completed:  09-22-19, 10-13-19, 06-06-20 COVID vaccine manufacturer: [place]      PCP - Elenor Quinones, MD Cardiologist - Allegra Lai, MD  Chest x-ray - 09-11-20 in Epic EKG - 08-31-20 in Epic Stress Test - 09-05-20 in Epic ECHO - 04-05-18 in Epic Cardiac Cath - 04-06-18 in Epic Pacemaker/ICD device last checked: Aflutter ablation - 02-24-19 in Epic Holter monitor 04-20-18 in Epic EP - Study 02-24-19 in Epic Sleep Study -  CPAP -   Fasting Blood Sugar -  Checks Blood Sugar _____ times a day  Blood Thinner Instructions: Aspirin Instructions: Last Dose:  Anesthesia review: Complete heart block, Mobitz 2 atrioventricular block.  Aflutter.  Asthma.  Patient denies shortness of breath, fever, cough and chest pain at PAT appointment   Patient verbalized understanding of instructions that were given to them at the PAT appointment. Patient was also instructed that they will need to review over the PAT instructions again at home before surgery.

## 2020-09-21 ENCOUNTER — Ambulatory Visit: Payer: Medicare Other | Admitting: Internal Medicine

## 2020-09-21 ENCOUNTER — Encounter (HOSPITAL_COMMUNITY): Payer: Self-pay

## 2020-09-21 ENCOUNTER — Encounter (HOSPITAL_COMMUNITY)
Admission: RE | Admit: 2020-09-21 | Discharge: 2020-09-21 | Disposition: A | Discharge status: Home / Self Care | Payer: Medicare Other | Source: Ambulatory Visit | Attending: Orthopedic Surgery | Admitting: Orthopedic Surgery

## 2020-09-21 ENCOUNTER — Other Ambulatory Visit: Payer: Self-pay

## 2020-09-21 DIAGNOSIS — Z01818 Encounter for other preprocedural examination: Secondary | ICD-10-CM | POA: Diagnosis not present

## 2020-09-21 LAB — SURGICAL PCR SCREEN
MRSA, PCR: NEGATIVE
Staphylococcus aureus: POSITIVE — AB

## 2020-09-21 LAB — URINALYSIS, ROUTINE W REFLEX MICROSCOPIC
Bilirubin Urine: NEGATIVE
Glucose, UA: NEGATIVE mg/dL
Hgb urine dipstick: NEGATIVE
Ketones, ur: NEGATIVE mg/dL
Leukocytes,Ua: NEGATIVE
Nitrite: NEGATIVE
Protein, ur: NEGATIVE mg/dL
Specific Gravity, Urine: 1.021 (ref 1.005–1.030)
pH: 5 (ref 5.0–8.0)

## 2020-09-21 LAB — BASIC METABOLIC PANEL
Anion gap: 10 (ref 5–15)
BUN: 27 mg/dL — ABNORMAL HIGH (ref 8–23)
CO2: 25 mmol/L (ref 22–32)
Calcium: 9.5 mg/dL (ref 8.9–10.3)
Chloride: 106 mmol/L (ref 98–111)
Creatinine, Ser: 1.11 mg/dL (ref 0.61–1.24)
GFR, Estimated: >60 mL/min (ref >60)
Glucose, Bld: 87 mg/dL (ref 70–99)
Potassium: 4.7 mmol/L (ref 3.5–5.1)
Sodium: 141 mmol/L (ref 135–145)

## 2020-09-21 LAB — CBC WITH DIFFERENTIAL/PLATELET
Abs Immature Granulocytes: 0.02 10*3/uL (ref 0.00–0.07)
Basophils Absolute: 0.1 10*3/uL (ref 0.0–0.1)
Basophils Relative: 1 %
Eosinophils Absolute: 0.5 10*3/uL (ref 0.0–0.5)
Eosinophils Relative: 7 %
HCT: 46.9 % (ref 39.0–52.0)
Hemoglobin: 15.5 g/dL (ref 13.0–17.0)
Immature Granulocytes: 0 %
Lymphocytes Relative: 24 %
Lymphs Abs: 1.7 10*3/uL (ref 0.7–4.0)
MCH: 32.2 pg (ref 26.0–34.0)
MCHC: 33 g/dL (ref 30.0–36.0)
MCV: 97.5 fL (ref 80.0–100.0)
Monocytes Absolute: 0.7 10*3/uL (ref 0.1–1.0)
Monocytes Relative: 10 %
Neutro Abs: 3.9 10*3/uL (ref 1.7–7.7)
Neutrophils Relative %: 58 %
Platelets: 203 10*3/uL (ref 150–400)
RBC: 4.81 MIL/uL (ref 4.22–5.81)
RDW: 14.3 % (ref 11.5–15.5)
WBC: 6.8 10*3/uL (ref 4.0–10.5)
nRBC: 0 % (ref 0.0–0.2)

## 2020-09-21 LAB — TYPE AND SCREEN
ABO/RH(D): B POS
Antibody Screen: NEGATIVE

## 2020-09-21 LAB — PROTIME-INR
INR: 1.1 (ref 0.8–1.2)
Prothrombin Time: 13.6 seconds (ref 11.4–15.2)

## 2020-09-21 LAB — APTT: aPTT: 29 seconds (ref 24–36)

## 2020-09-21 NOTE — Progress Notes (Signed)
Spoke with Janett Billow PA with anesthesia in regards to pt stating prescription for Eliquis was given to pt per Dr Mayer Camel and pt understood he may need to start prior to surgery. Clarified with pt this is usually not the protocol but Janett Billow is checking with Dr Damita Dunnings office and will inform pt of instruction.

## 2020-09-21 NOTE — Progress Notes (Signed)
pts PCR positive for STAPH. Results sent to Dr Mayer Camel. Pt aware.

## 2020-09-25 ENCOUNTER — Ambulatory Visit: Payer: Medicare Other | Admitting: Internal Medicine

## 2020-09-25 ENCOUNTER — Telehealth: Payer: Self-pay | Admitting: Pulmonary Disease

## 2020-09-25 NOTE — Telephone Encounter (Signed)
Xolair Prefilled Syringe Order: 150mg  Prefilled Syringe:  #2 75mg  Prefilled Syringe: #n/a Ordered Date: 09/25/20 Expected date of arrival: 09/26/20 Ordered by: [place]: Nigel Mormon

## 2020-09-26 NOTE — Anesthesia Preprocedure Evaluation (Deleted)
Anesthesia Evaluation                                  Anesthesia Evaluation  Patient identified by MRN, date of birth, ID band Patient awake    Reviewed: Allergy & Precautions, NPO status , Patient's Chart, lab work & pertinent test results  Airway Mallampati: II  TM Distance: >3 FB Neck ROM: Full    Dental no notable dental hx. 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 Wall thickness was normal. Systolic function was normal. The estimated ejectionfraction was in the range of 55% to 60%. Wall motion was normal; there were no regional wall motion abnormalities.  - Aortic valve: Trileaflet; mildly thickened, mildly calcified  leaflets.  - Right ventricle: The cavity size was mildly dilated. Wall  thickness was normal.  - Tricuspid valve: There was trivial regurgitation.   Last evaluation by Dr. Curt Bears was on 10/11/19 for bradycardia, 2nd degree AVB, aflutter ablation follow-up. He was in 2nd degree Mobitz 1 that day. Patient preferred to avoid PPM. Still asymptomatic, and able to go to the gym and get HR into the 120s. One year follow-up recommended.   Cardiac cath 04/06/18: The left ventricular systolic function is normal. LV end diastolic pressure is normal. The left ventricular ejection fraction is 55-65% by visual estimate. There is no aortic valve stenosis. No significant CAD. (LM, LAD, LCX, RCA angiographically normal.   Neuro/Psych negative neurological ROS  negative psych ROS   GI/Hepatic Neg liver ROS, Right inguinal hernia    Endo/Other  negative endocrine ROS  Renal/GU negative Renal  ROS  negative genitourinary   Musculoskeletal  (+) Arthritis , Osteoarthritis,    Abdominal   Peds  Hematology negative hematology ROS (+)   Anesthesia Other Findings   Reproductive/Obstetrics negative OB ROS                            Anesthesia Physical Anesthesia Plan  ASA: III  Anesthesia Plan: General and Regional   Post-op Pain Management: GA combined w/ Regional for post-op pain   Induction: Intravenous  PONV Risk Score and Plan: 2 and Ondansetron, Dexamethasone, Midazolam and Treatment may vary due to age or medical condition  Airway Management Planned: Oral ETT  Additional Equipment: None  Intra-op Plan:   Post-operative Plan: Extubation in OR  Informed Consent: I have reviewed the patients History and Physical, chart, labs and discussed the procedure including the risks, benefits and alternatives for the proposed anesthesia with the patient or authorized representative who has indicated his/her understanding and acceptance.     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 Pt has elected to avoid a PPM. D/w patient spinal vs GA, but given very low heart rate and dysrhythmia I think it best to avoid a spinal in case of effects on the cardioaccelerator fibers. Will proceed with GA and TAP block for postoperative pain, as I believe this is the safest  option for the patient. He is agreeable with the plan. Will have pads available in room for transcutaneous pacing in the event of deterioration into complete AVB.)       Anesthesia Quick Evaluation                                   Anesthesia Evaluation  Patient identified by MRN, date of birth, ID band Patient awake    Reviewed: Allergy & Precautions, NPO status , Patient's Chart, lab work & pertinent test results  Airway Mallampati: II  TM Distance: >3 FB Neck ROM:  Full    Dental no notable dental hx. 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 Wall thickness was normal. Systolic function was normal. The estimated ejectionfraction was in the range of 55% to 60%. Wall motion was normal; there were no regional wall motion abnormalities.  - Aortic valve: Trileaflet; mildly thickened, mildly calcified  leaflets.  - Right ventricle: The cavity size was mildly dilated. Wall  thickness was normal.  - Tricuspid valve: There was trivial regurgitation.   Last evaluation by Dr. Curt Bears was on 10/11/19 for bradycardia, 2nd degree AVB, aflutter ablation follow-up. He was in 2nd degree Mobitz 1 that day. Patient preferred to avoid PPM. Still asymptomatic, and able to go to the gym and get HR into the 120s. One year follow-up recommended.   Cardiac cath 04/06/18: The left ventricular systolic function is normal. LV end diastolic pressure is normal. The left ventricular ejection fraction is 55-65% by visual estimate. There is no aortic valve stenosis. No significant CAD. (LM, LAD, LCX, RCA angiographically normal.   Neuro/Psych negative neurological ROS  negative psych ROS   GI/Hepatic Neg liver ROS, Right inguinal hernia    Endo/Other  negative endocrine ROS  Renal/GU negative Renal ROS  negative genitourinary   Musculoskeletal  (+) Arthritis , Osteoarthritis,    Abdominal   Peds  Hematology negative hematology ROS (+)   Anesthesia Other Findings   Reproductive/Obstetrics negative OB ROS                            Anesthesia Physical Anesthesia  Plan  ASA: III  Anesthesia Plan: General and Regional   Post-op Pain Management: GA combined w/ Regional for post-op pain   Induction: Intravenous  PONV Risk Score and Plan: 2 and Ondansetron, Dexamethasone, Midazolam and Treatment may vary due to age or medical condition  Airway Management Planned: Oral ETT  Additional Equipment: None  Intra-op Plan:   Post-operative Plan: Extubation in OR  Informed Consent: I have reviewed the patients History and Physical, chart, labs and discussed the procedure including the risks, benefits and alternatives for the proposed anesthesia with the patient or authorized representative who has indicated his/her understanding and acceptance.     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 Pt has elected to avoid a PPM. D/w patient spinal vs GA, but given very low heart  rate and dysrhythmia I think it best to avoid a spinal in case of effects on the cardioaccelerator fibers. Will proceed with GA and TAP block for postoperative pain, as I believe this is the safest option for the patient. He is agreeable with the plan. Will have pads available in room for transcutaneous pacing in the event of deterioration into complete AVB.)       Anesthesia Quick Evaluation  Anesthesia Physical Anesthesia Plan  ASA:   Anesthesia Plan:    Post-op Pain Management:    Induction:   PONV Risk Score and Plan:   Airway Management Planned:   Additional Equipment:   Intra-op Plan:   Post-operative Plan:   Informed Consent:   Plan Discussed with:   Anesthesia Plan Comments: (See PAT note 09/21/2020, Konrad Felix, PA-C)        Anesthesia Quick Evaluation

## 2020-09-26 NOTE — Telephone Encounter (Signed)
Xolair Prefilled Syringe Received:  150mg  Prefilled Syringe >> quantity #2, lot # [id number], exp date 08/31/02022 75mg  Prefilled Syringe >> quantity #n/a Medication arrival date: 09/26/20 Received by: Dove Gresham,LPN

## 2020-09-26 NOTE — Progress Notes (Signed)
Anesthesia Chart Review   Case: [id number] Date/Time: 10/02/20 0930   Procedure: RIGHT HIP ARTHROPLASTY ANTERIOR APPROACH (Right Hip) - 3E BED   Anesthesia type: Spinal   Pre-op diagnosis: RIGHT HIP OSTEOARTHRITIS   Location: [facility] ROOM 08 / [facility]   Surgeons: Frederik Pear, MD      DISCUSSION:75 y.o. never smoker with h/o asthma, DVT s/p right TKA 08/10/18, CHB, atrial flutter s/p ablation 02/24/2019, right hip OA scheduled for above procedure 10/02/2020 with Dr. Frederik Pear.   Pt last seen by pulmonology 09/11/20. Per OV note, "Peri-operative Assessment of Pulmonary Risk for Non-Thoracic Surgery:  ForMr. Asher, moderate risk of perioperative pulmonary complications is increased by:             Age greater than 65 years             Asthma  Respiratory complications generally occur in 1% of ASA Class I patients, 5% of ASA Class II and 10% of ASA Class III-IV patients These complications rarely result in mortality and iclude postoperative pneumonia, atelectasis, pulmonary embolism, ARDS and increased time requiring postoperative mechanical ventilation. Overall, I recommend proceeding with the surgery if the risk for respiratory complications are outweighed by the potential benefits. This will need to be discussed between the patient and surgeon. To reduce risks of respiratory complications, I recommend: --Smoking cessation 6-8 weeks if possible --Pre- and post-operative incentive spirometry performed frequently while awake --Avoiding use of pancuronium during anesthesia."  Per cardiology note 09/07/2020, "Chart reviewed as part of pre-operative protocol coverage. Patient was contacted 09/07/2020 in reference to pre-operative risk assessment for pending surgery as outlined below.  BENSYN VANDEGRIFT was last seen on 08/31/20 by Dr. Curt Bears. At that time, it was recommended that he undergo an exercise stress test which was performed and reviewed by Dr. Curt Bears 09/05/20. Recommendations are below: "Fortunately he  conducts one-to-one at faster heart rates. He does not need a pacemaker unless he has symptoms of bradycardia such as weakness and fatigue. He is planned for surgery. He would be at intermediate to high risk for this intermediate risk procedure. I do not feel that pacemaker implant would be necessary prior to his surgery. I would, though, recommend surgery being performed at the hospital, in case there is a complication. Based on his stress test, with higher adrenergic tone, he does conduct one-to-one and thus no pacemaker is indicated unless as above he has symptoms." The patient was advised that if he develops new symptoms prior to surgery to contact our office to arrange for a follow-up visit, and he verbalized understanding."  Anticipate pt can proceed with planned procedure barring acute status change.   VS: BP (!) 156/99   Pulse (!) 41   Temp 36.4 C (Oral)   Resp 16   Ht 5\' 9"  (1.753 m)   Wt 74.8 kg   SpO2 99%   BMI 24.37 kg/m   PROVIDERS: Ryter-Brown, Shyrl Numbers, MD is PCP   Allegra Lai, MD is Cardiology LABS: Labs reviewed: Acceptable for surgery. (all labs ordered are listed, but only abnormal results are displayed)  Labs Reviewed  SURGICAL PCR SCREEN - Abnormal; Notable for the following components:      Result Value   Staphylococcus aureus POSITIVE (*)    All other components within normal limits  BASIC METABOLIC PANEL - Abnormal; Notable for the following components:   BUN 27 (*)    All other components within normal limits  CBC WITH DIFFERENTIAL/PLATELET  PROTIME-INR  APTT  URINALYSIS,  ROUTINE W REFLEX MICROSCOPIC  TYPE AND SCREEN     IMAGES:   EKG: 08/31/2020 Rate 39 bpm    CV: Echo 04/05/2018 Study Conclusions   - Left ventricle: The cavity size was normal. Wall thickness was  normal. Systolic function was normal. The estimated ejection  fraction was in the range of 55% to 60%. Wall motion was normal;  there were no regional wall motion  abnormalities.  - Aortic valve: Trileaflet; mildly thickened, mildly calcified  leaflets.  - Right ventricle: The cavity size was mildly dilated. Wall  thickness was normal.  - Tricuspid valve: There was trivial regurgitation.  Past Medical History:  Diagnosis Date  . Altitude sickness   . Asthma    exercise-induced; prn inhaler  . AV block, 2nd degree   . Bilateral inguinal hernia (BIH) 10/16/2012   Overview:  [facility] -Dr. Brantley Stage.  Observation for now 10/2012  . Complete heart block ([place]) 04/04/2018  . COVID-19   . DVT (deep venous thrombosis) ([place]) 08/13/2018   Right peroneal DVT 08/13/18, post right TKA 08/10/18  . Insomnia 07/14/2014  . Mobitz type 1 second degree atrioventricular block   . Nasal polyps   . OA (osteoarthritis) 09/24/2012  . 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 Reactive airway disease 09/24/2012  . Rotator cuff tear, right    impingement, AC arthrosis    Past Surgical History:  Procedure Laterality Date  . A-FLUTTER ABLATION N/A 02/24/2019   Procedure: A-FLUTTER ABLATION;  Surgeon: Constance Haw, MD;  Location: [place] [facility];  Service: Cardiovascular;  Laterality: N/A;  . BACK SURGERY    . BIOPSY  07/23/2019   Procedure: BIOPSY;  Surgeon: Carol Ada, MD;  [facility];  Service: Endoscopy;;  . CARDIAC CATHETERIZATION    . CATARACT EXTRACTION W/ INTRAOCULAR LENS IMPLANT Right   . CATARACT EXTRACTION W/ INTRAOCULAR LENS IMPLANT Left    per patient about two years ago 2019  . COLONOSCOPY WITH PROPOFOL N/A 07/23/2019   Procedure: COLONOSCOPY WITH PROPOFOL;  Surgeon: Carol Ada, MD;  [facility];  Service: Endoscopy;  Laterality: N/A;  . EYE SURGERY    . HERNIA REPAIR    . INGUINAL HERNIA REPAIR Right 05/09/2020   Procedure: OPEN RIGHT INGUINAL HERNIA REPAIR;  Surgeon: [name], MD;  Location: [place];  Service: General;  Laterality: Right;  . INSERTION OF MESH Right 05/09/2020   Procedure:  INSERTION OF MESH;  Surgeon: [name], MD;  Location: [place];  Service: General;  Laterality: Right;  . LEFT HEART CATH AND CORONARY ANGIOGRAPHY N/A 04/06/2018   Procedure: LEFT HEART CATH AND CORONARY ANGIOGRAPHY;  Surgeon: Jettie Booze, MD;  Location: [place] [facility];  Service: Cardiovascular;  Laterality: N/A;  . LUMBAR LAMINECTOMY  age 22s  . NASAL SINUS SURGERY    . POLYPECTOMY  07/23/2019   Procedure: POLYPECTOMY;  Surgeon: Carol Ada, MD;  [facility];  Service: Endoscopy;;  . REFRACTIVE SURGERY    . ROTATOR CUFF REPAIR Right   . SHOULDER ARTHROSCOPY  08/06/2011   Procedure: ARTHROSCOPY SHOULDER;  Surgeon: Cammie Sickle., MD;  Location: [place];  Service: Orthopedics;  Laterality: Right;  with subacromial decompression, arthroscopic subscapularis repair, and rotator cuff repair  . TOTAL KNEE ARTHROPLASTY Right 08/10/2018   Procedure: TOTAL KNEE ARTHROPLASTY;  Surgeon: Frederik Pear, MD;  [facility];  Service: Orthopedics;  Laterality: Right;  . TRIGGER FINGER RELEASE Right 06/30/2014   Procedure:  RELEASE A-1 PULLEY RIGHT THUMB;  Surgeon: Daryll Brod, MD;  Location: [place];  Service: Orthopedics;  Laterality: Right;  ANESTHESIA:  IV REGIONAL FAB    MEDICATIONS: . albuterol (VENTOLIN HFA) 108 (90 Base) MCG/ACT inhaler  . Fluticasone-Umeclidin-Vilant (TRELEGY ELLIPTA) 200-62.5-25 MCG/INH AEPB  . omalizumab Arvid Right) 75 MG/0.5ML prefilled syringe   No current facility-administered medications for this encounter.     Konrad Felix, PA-C [facility] [phone]

## 2020-09-27 ENCOUNTER — Other Ambulatory Visit: Payer: Self-pay

## 2020-09-27 ENCOUNTER — Encounter (HOSPITAL_COMMUNITY)
Admission: RE | Admit: 2020-09-27 | Discharge: 2020-09-27 | Disposition: A | Discharge status: Home / Self Care | Payer: Medicare Other | Source: Ambulatory Visit | Attending: Orthopedic Surgery | Admitting: Orthopedic Surgery

## 2020-09-27 DIAGNOSIS — M25561 Pain in right knee: Secondary | ICD-10-CM | POA: Diagnosis not present

## 2020-09-27 DIAGNOSIS — M1611 Unilateral primary osteoarthritis, right hip: Secondary | ICD-10-CM | POA: Diagnosis present

## 2020-09-27 DIAGNOSIS — Z96651 Presence of right artificial knee joint: Secondary | ICD-10-CM | POA: Insufficient documentation

## 2020-09-27 DIAGNOSIS — Z471 Aftercare following joint replacement surgery: Secondary | ICD-10-CM | POA: Diagnosis not present

## 2020-09-27 MED ORDER — TECHNETIUM TC 99M MEDRONATE IV KIT
20.0000 | PACK | Freq: Once | INTRAVENOUS | Status: AC | PRN
  Administered 2020-09-27: 20 via INTRAVENOUS

## 2020-09-27 NOTE — H&P (Signed)
TOTAL HIP ADMISSION H&P  Patient is admitted for right total hip arthroplasty.  Subjective:  Chief Complaint: right hip pain  HPI: David [name], 75 y.o. male, has a history of pain and functional disability in the right hip(s) due to arthritis and patient has failed non-surgical conservative treatments for greater than 12 weeks to include NSAID's and/or analgesics, flexibility and strengthening excercises, weight reduction as appropriate and activity modification.  Onset of symptoms was gradual starting 1 years ago with gradually worsening course since that time.The patient noted no past surgery on the right hip(s).  Patient currently rates pain in the right hip at 10 out of 10 with activity. Patient has night pain, worsening of pain with activity and weight bearing, trendelenberg gait, pain that interfers with activities of daily living and pain with passive range of motion. Patient has evidence of subchondral cysts, subchondral sclerosis, joint subluxation and joint space narrowing by imaging studies. This condition presents safety issues increasing the risk of falls.   There is no current active infection.  Patient Active Problem List   Diagnosis Date Noted  . Healthcare maintenance 09/11/2020  . Surgical counseling visit 09/11/2020  . Arthritis of right knee 08/10/2018  . 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 Mobitz type 1 second degree atrioventricular block   . Edema extremities   . Elevated troponin   . Complete heart block ([place]) 04/04/2018  . Insomnia 07/14/2014  . Bilateral inguinal hernia (BIH) 10/16/2012  . OA (osteoarthritis) 09/24/2012  . Allergic asthma 09/24/2012   Past Medical History:  Diagnosis Date  . Altitude sickness   . Asthma    exercise-induced; prn inhaler  . AV block, 2nd degree   . Bilateral inguinal hernia (BIH) 10/16/2012   Overview:  [facility] -Dr. Brantley Stage.  Observation for now 10/2012  . Complete heart block ([place]) 04/04/2018  .  COVID-19   . DVT (deep venous thrombosis) ([place]) 08/13/2018   Right peroneal DVT 08/13/18, post right TKA 08/10/18  . Insomnia 07/14/2014  . Mobitz type 1 second degree atrioventricular block   . Nasal polyps   . OA (osteoarthritis) 09/24/2012  . 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 Reactive airway disease 09/24/2012  . Rotator cuff tear, right    impingement, AC arthrosis    Past Surgical History:  Procedure Laterality Date  . A-FLUTTER ABLATION N/A 02/24/2019   Procedure: A-FLUTTER ABLATION;  Surgeon: Constance Haw, MD;  Location: [place] [facility];  Service: Cardiovascular;  Laterality: N/A;  . BACK SURGERY    . BIOPSY  07/23/2019   Procedure: BIOPSY;  Surgeon: Carol Ada, MD;  [facility];  Service: Endoscopy;;  . CARDIAC CATHETERIZATION    . CATARACT EXTRACTION W/ INTRAOCULAR LENS IMPLANT Right   . CATARACT EXTRACTION W/ INTRAOCULAR LENS IMPLANT Left    per patient about two years ago 2019  . COLONOSCOPY WITH PROPOFOL N/A 07/23/2019   Procedure: COLONOSCOPY WITH PROPOFOL;  Surgeon: Carol Ada, MD;  [facility];  Service: Endoscopy;  Laterality: N/A;  . EYE SURGERY    . HERNIA REPAIR    . INGUINAL HERNIA REPAIR Right 05/09/2020   Procedure: OPEN RIGHT INGUINAL HERNIA REPAIR;  Surgeon: [name], MD;  Location: [place];  Service: General;  Laterality: Right;  . INSERTION OF MESH Right 05/09/2020   Procedure: INSERTION OF MESH;  Surgeon: [name], MD;  Location: [place];  Service: General;  Laterality: Right;  . LEFT HEART CATH AND CORONARY ANGIOGRAPHY N/A  04/06/2018   Procedure: LEFT HEART CATH AND CORONARY ANGIOGRAPHY;  Surgeon: Corky Crafts, MD;  Location: [facility];  Service: Cardiovascular;  Laterality: N/A;  . LUMBAR LAMINECTOMY  age 89s  . NASAL SINUS SURGERY    . POLYPECTOMY  07/23/2019   Procedure: POLYPECTOMY;  Surgeon: Jeani Hawking, MD;  [facility];  Service: Endoscopy;;  . REFRACTIVE SURGERY     . ROTATOR CUFF REPAIR Right   . SHOULDER ARTHROSCOPY  08/06/2011   Procedure: ARTHROSCOPY SHOULDER;  Surgeon: Wyn Forster., MD;  Location: [facility];  Service: Orthopedics;  Laterality: Right;  with subacromial decompression, arthroscopic subscapularis repair, and rotator cuff repair  . TOTAL KNEE ARTHROPLASTY Right 08/10/2018   Procedure: TOTAL KNEE ARTHROPLASTY;  Surgeon: Gean Birchwood, MD;  [facility];  Service: Orthopedics;  Laterality: Right;  . TRIGGER FINGER RELEASE Right 06/30/2014   Procedure: RELEASE A-1 PULLEY RIGHT THUMB;  Surgeon: Cindee Salt, MD;  Location:  [facility];  Service: Orthopedics;  Laterality: Right;  ANESTHESIA:  IV REGIONAL FAB    No current facility-administered medications for this encounter.   Current Outpatient Medications  Medication Sig Dispense Refill Last Dose  . albuterol (VENTOLIN HFA) 108 (90 Base) MCG/ACT inhaler Inhale 2 puffs into the lungs every 4 (four) hours as needed for wheezing or shortness of breath. 8 g 1   . Fluticasone-Umeclidin-Vilant (TRELEGY ELLIPTA) 200-62.5-25 MCG/INH AEPB Inhale 1 puff into the lungs daily. (Patient taking differently: Inhale 1 puff into the lungs daily as needed (asthma).) 1 each 11   . omalizumab (XOLAIR) 75 MG/0.5ML prefilled syringe Inject 75 mg into the skin every 28 (twenty-eight) days.      Allergies  Allergen Reactions  . Codeine Itching    Social History   Tobacco Use  . Smoking status: Never Smoker  . Smokeless tobacco: Never Used  Substance Use Topics  . Alcohol use: Yes    Comment: occ. beer    Family History  Problem Relation Age of Onset  . Heart Problems Mother        had pacemaker, lived to 55  . Alzheimer's disease Father   . Prostate cancer Father      Review of Systems  Constitutional: Negative.   HENT: Negative.   Eyes:       Glasses  Respiratory:       Asthma  Cardiovascular: Negative.   Gastrointestinal: Negative.   Endocrine:  Negative.   Genitourinary: Negative.   Musculoskeletal: Positive for arthralgias.  Skin: Negative.   Allergic/Immunologic: Negative.   Neurological: Negative.   Hematological: Negative.   Psychiatric/Behavioral: Negative.     Objective:  Physical Exam Constitutional:      Appearance: Normal appearance. He is normal weight.  HENT:     Head: Normocephalic and atraumatic.     Nose: Nose normal.     Mouth/Throat:     Mouth: Mucous membranes are moist.     Pharynx: Oropharynx is clear.  Cardiovascular:     Pulses: Normal pulses.  Pulmonary:     Effort: Pulmonary effort is normal.  Musculoskeletal:     Cervical back: Normal range of motion and neck supple.     Comments: He is neurovascular intact distally.  Internal rotation of the right hip reproduces severe groin pain with a pelvic shift.  External rotation does not.  Neurovascular intact distally.  No swelling to the leg or foot.  The toes are pink and well perfused.  Skin:  General: Skin is warm and dry.  Neurological:     General: No focal deficit present.     Mental Status: He is alert and oriented to person, place, and time. Mental status is at baseline.  Psychiatric:        Mood and Affect: Mood normal.        Behavior: Behavior normal.        Thought Content: Thought content normal.        Judgment: Judgment normal.     Vital signs in last 24 hours:    Labs:   Estimated body mass index is 24.37 kg/m as calculated from the following:   Height as of 09/21/20: 5\' 9"  (1.753 m).   Weight as of 09/21/20: 74.8 kg.   Imaging Review Plain radiographs demonstrate AP pelvis and crosstable lateral of the pelvis show end-stage arthritis of the right hip with mild lateral subluxation of the femoral head near bone-on-bone arthritic changes peripheral osteophytes as well as subchondral cysts in the femoral head and acetabulum.   Assessment/Plan:  End stage arthritis, right hip(s)  The patient history, physical  examination, clinical judgement of the provider and imaging studies are consistent with end stage degenerative joint disease of the right hip(s) and total hip arthroplasty is deemed medically necessary. The treatment options including medical management, injection therapy, arthroscopy and arthroplasty were discussed at length. The risks and benefits of total hip arthroplasty were presented and reviewed. The risks due to aseptic loosening, infection, stiffness, dislocation/subluxation,  thromboembolic complications and other imponderables were discussed.  The patient acknowledged the explanation, agreed to proceed with the plan and consent was signed. Patient is being admitted for inpatient treatment for surgery, pain control, PT, OT, prophylactic antibiotics, VTE prophylaxis, progressive ambulation and ADL's and discharge planning.The patient is planning to be discharged home with home health services    Patient's anticipated LOS is less than 2 midnights, meeting these requirements: - Younger than 41 - Lives within 1 hour of care - Has a competent adult at home to recover with post-op recover - NO history of  - Chronic pain requiring opiods  - Diabetes  - Coronary Artery Disease  - Heart failure  - Heart attack  - Stroke  - DVT/VTE  - Cardiac arrhythmia  - Respiratory Failure/COPD  - Renal failure  - Anemia  - Advanced Liver disease

## 2020-09-28 ENCOUNTER — Other Ambulatory Visit (HOSPITAL_COMMUNITY)
Admission: RE | Admit: 2020-09-28 | Discharge: 2020-09-28 | Disposition: A | Discharge status: Home / Self Care | Payer: Medicare Other | Source: Ambulatory Visit | Attending: Orthopedic Surgery | Admitting: Orthopedic Surgery

## 2020-09-28 DIAGNOSIS — Z20822 Contact with and (suspected) exposure to covid-19: Secondary | ICD-10-CM | POA: Insufficient documentation

## 2020-09-28 DIAGNOSIS — Z01812 Encounter for preprocedural laboratory examination: Secondary | ICD-10-CM | POA: Insufficient documentation

## 2020-09-29 LAB — SARS CORONAVIRUS 2 (TAT 6-24 HRS): SARS Coronavirus 2: NEGATIVE

## 2020-09-30 NOTE — Anesthesia Preprocedure Evaluation (Deleted)
Anesthesia Evaluation    Reviewed: Allergy & Precautions, H&P , Patient's Chart, lab work & pertinent test results  Airway        Dental   Pulmonary neg pulmonary ROS,           Cardiovascular Exercise Tolerance: Good + dysrhythmias      Neuro/Psych negative neurological ROS  negative psych ROS   GI/Hepatic negative GI ROS, Neg liver ROS,   Endo/Other  negative endocrine ROS  Renal/GU negative Renal ROS  negative genitourinary   Musculoskeletal negative musculoskeletal ROS (+)   Abdominal   Peds negative pediatric ROS (+)  Hematology negative hematology ROS (+)   Anesthesia Other Findings   Reproductive/Obstetrics negative OB ROS                                                              Anesthesia Evaluation  Patient identified by MRN, date of birth, ID band Patient awake    Reviewed: Allergy & Precautions, NPO status , Patient's Chart, lab work & pertinent test results  Airway Mallampati: II  TM Distance: >3 FB Neck ROM: Full    Dental no notable dental hx. 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 Wall thickness was normal. Systolic function was normal. The estimated ejectionfraction was in the range of 55% to 60%. Wall motion was normal; there were no regional wall motion abnormalities.  - Aortic valve: Trileaflet; mildly thickened, mildly calcified  leaflets.  - Right ventricle: The cavity size was mildly dilated. Wall  thickness was normal.  - Tricuspid valve: There was trivial regurgitation.    Last evaluation by Dr. Curt Bears was on 10/11/19 for bradycardia, 2nd degree AVB, aflutter ablation follow-up. He was in 2nd degree Mobitz 1 that day. Patient preferred to avoid PPM. Still asymptomatic, and able to go to the gym and get HR into the 120s. One year follow-up recommended.   Cardiac cath 04/06/18: The left ventricular systolic function is normal. LV end diastolic pressure is normal. The left ventricular ejection fraction is 55-65% by visual estimate. There is no aortic valve stenosis. No significant CAD. (LM, LAD, LCX, RCA angiographically normal.   Neuro/Psych negative neurological ROS  negative psych ROS   GI/Hepatic Neg liver ROS, Right inguinal hernia    Endo/Other  negative endocrine ROS  Renal/GU negative Renal ROS  negative genitourinary   Musculoskeletal  (+) Arthritis , Osteoarthritis,    Abdominal   Peds  Hematology negative hematology ROS (+)   Anesthesia Other Findings   Reproductive/Obstetrics negative OB ROS                            Anesthesia Physical Anesthesia Plan  ASA: III  Anesthesia Plan: General and Regional   Post-op Pain Management: GA combined w/ Regional for post-op pain   Induction: Intravenous  PONV Risk Score and Plan: 2 and Ondansetron, Dexamethasone, Midazolam and Treatment  may vary due to age or medical condition  Airway Management Planned: Oral ETT  Additional Equipment: None  Intra-op Plan:   Post-operative Plan: Extubation in OR  Informed Consent: I have reviewed the patients History and Physical, chart, labs and discussed the procedure including the risks, benefits and alternatives for the proposed anesthesia with the patient or authorized representative who has indicated his/her understanding and acceptance.     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 Pt has elected to  avoid a PPM. D/w patient spinal vs GA, but given very low heart rate and dysrhythmia I think it best to avoid a spinal in case of effects on the cardioaccelerator fibers. Will proceed with GA and TAP block for postoperative pain, as I believe this is the safest option for the patient. He is agreeable with the plan. Will have pads available in room for transcutaneous pacing in the event of deterioration into complete AVB.)       Anesthesia Quick Evaluation  Anesthesia Physical Anesthesia Plan  ASA: II  Anesthesia Plan: Spinal and MAC   Post-op Pain Management:    Induction:   PONV Risk Score and Plan: 1  Airway Management Planned: Natural Airway and Nasal Cannula  Additional Equipment:   Intra-op Plan:   Post-operative Plan:   Informed Consent: I have reviewed the patients History and Physical, chart, labs and discussed the procedure including the risks, benefits and alternatives for the proposed anesthesia with the patient or authorized representative who has indicated his/her understanding and acceptance.       Plan Discussed with: Anesthesiologist  Anesthesia Plan Comments:         Anesthesia Quick Evaluation

## 2020-09-30 NOTE — Anesthesia Preprocedure Evaluation (Addendum)
Anesthesia Evaluation  Patient identified by MRN, date of birth, ID band Patient awake    Reviewed: Allergy & Precautions, H&P , NPO status , Patient's Chart, lab work & pertinent test results  Airway Mallampati: II  TM Distance: >3 FB Neck ROM: Full    Dental no notable dental hx. (+) Teeth Intact, Dental Advisory Given   Pulmonary neg pulmonary ROS,    Pulmonary exam normal breath sounds clear to auscultation       Cardiovascular Exercise Tolerance: Good + DVT  Normal cardiovascular exam+ dysrhythmias  Rhythm:Regular Rate:Normal  cho 04/2018: - Left ventricle: The cavity size was normal. Wall thickness was normal. Systolic function was normal. The estimated ejectionfraction was in the range of 55% to 60%. Wall motion was normal; there were no regional wall motion abnormalities.  - Aortic valve: Trileaflet; mildly thickened, mildly calcified  leaflets.  - Right ventricle: The cavity size was mildly dilated. Wall  thickness was normal.  - Tricuspid valve: There was trivial regurgitation.   Last evaluation by Dr. Curt Bears was on 10/11/19 for bradycardia, 2nd degree AVB, aflutter ablation follow-up. He was in 2nd degree Mobitz 1 that day. Patient preferred to avoid PPM. Still asymptomatic, and able to go to the gym and get HR into the 120s. One year follow-up recommended.   Cardiac cath 04/06/18: The left ventricular systolic function is normal. LV end diastolic pressure is normal. The left ventricular ejection fraction is 55-65% by visual estimate. There is no aortic valve stenosis. No significant CAD. (LM, LAD, LCX, RCA angiographically normal.   Neuro/Psych negative neurological ROS  negative psych ROS   GI/Hepatic negative GI ROS, Neg liver ROS,   Endo/Other  negative endocrine ROS  Renal/GU negative Renal ROS  negative genitourinary   Musculoskeletal  (+) Arthritis , Osteoarthritis,    Abdominal    Peds negative pediatric ROS (+)  Hematology negative hematology ROS (+)   Anesthesia Other Findings   Reproductive/Obstetrics negative OB ROS                            Anesthesia Physical Anesthesia Plan  ASA: III  Anesthesia Plan: MAC and Spinal   Post-op Pain Management:    Induction:   PONV Risk Score and Plan: 1  Airway Management Planned: Natural Airway and Nasal Cannula  Additional Equipment: None  Intra-op Plan:   Post-operative Plan:   Informed Consent: I have reviewed the patients History and Physical, chart, labs and discussed the procedure including the risks, benefits and alternatives for the proposed anesthesia with the patient or authorized representative who has indicated his/her understanding and acceptance.       Plan Discussed with: Anesthesiologist and CRNA  Anesthesia Plan Comments:        Anesthesia Quick Evaluation

## 2020-10-01 MED ORDER — TRANEXAMIC ACID 1000 MG/10ML IV SOLN
2000.0000 mg | INTRAVENOUS | Status: DC
  Filled 2020-10-01: qty 20

## 2020-10-01 MED ORDER — BUPIVACAINE LIPOSOME 1.3 % IJ SUSP
10.0000 mL | Freq: Once | INTRAMUSCULAR | Status: DC
  Filled 2020-10-01: qty 10

## 2020-10-02 ENCOUNTER — Ambulatory Visit (HOSPITAL_COMMUNITY): Payer: Medicare Other | Admitting: Anesthesiology

## 2020-10-02 ENCOUNTER — Ambulatory Visit (HOSPITAL_COMMUNITY): Payer: Medicare Other

## 2020-10-02 ENCOUNTER — Ambulatory Visit (HOSPITAL_COMMUNITY): Payer: Medicare Other | Admitting: Physician Assistant

## 2020-10-02 ENCOUNTER — Encounter (HOSPITAL_COMMUNITY)
Admission: RE | Disposition: A | Discharge status: Home / Self Care | Payer: Self-pay | Source: Other Acute Inpatient Hospital | Attending: Orthopedic Surgery

## 2020-10-02 ENCOUNTER — Encounter (HOSPITAL_COMMUNITY): Payer: Self-pay | Admitting: Orthopedic Surgery

## 2020-10-02 ENCOUNTER — Ambulatory Visit (HOSPITAL_COMMUNITY)
Admission: RE | Admit: 2020-10-02 | Discharge: 2020-10-02 | Disposition: A | Discharge status: Home / Self Care | Payer: Medicare Other | Source: Other Acute Inpatient Hospital | Attending: Orthopedic Surgery | Admitting: Orthopedic Surgery

## 2020-10-02 DIAGNOSIS — Z96641 Presence of right artificial hip joint: Secondary | ICD-10-CM | POA: Diagnosis not present

## 2020-10-02 DIAGNOSIS — M1611 Unilateral primary osteoarthritis, right hip: Secondary | ICD-10-CM | POA: Insufficient documentation

## 2020-10-02 DIAGNOSIS — Z885 Allergy status to narcotic agent status: Secondary | ICD-10-CM | POA: Insufficient documentation

## 2020-10-02 DIAGNOSIS — Z419 Encounter for procedure for purposes other than remedying health state, unspecified: Secondary | ICD-10-CM

## 2020-10-02 DIAGNOSIS — Z471 Aftercare following joint replacement surgery: Secondary | ICD-10-CM | POA: Diagnosis not present

## 2020-10-02 DIAGNOSIS — J45909 Unspecified asthma, uncomplicated: Secondary | ICD-10-CM | POA: Diagnosis not present

## 2020-10-02 DIAGNOSIS — G4700 Insomnia, unspecified: Secondary | ICD-10-CM | POA: Diagnosis not present

## 2020-10-02 DIAGNOSIS — M25751 Osteophyte, right hip: Secondary | ICD-10-CM | POA: Diagnosis not present

## 2020-10-02 DIAGNOSIS — Z79899 Other long term (current) drug therapy: Secondary | ICD-10-CM | POA: Insufficient documentation

## 2020-10-02 DIAGNOSIS — I442 Atrioventricular block, complete: Secondary | ICD-10-CM | POA: Diagnosis not present

## 2020-10-02 HISTORY — PX: TOTAL HIP ARTHROPLASTY: SHX124

## 2020-10-02 SURGERY — ARTHROPLASTY, HIP, TOTAL, ANTERIOR APPROACH
Anesthesia: Monitor Anesthesia Care | Site: Hip | Laterality: Right

## 2020-10-02 MED ORDER — METHOCARBAMOL 500 MG IVPB - SIMPLE MED: 500.0000 mg | Freq: Four times a day (QID) | INTRAVENOUS | Status: DC | PRN

## 2020-10-02 MED ORDER — BUPIVACAINE-EPINEPHRINE 0.25% -1:200000 IJ SOLN
INTRAMUSCULAR | Status: DC | PRN
  Administered 2020-10-02: 30 mL

## 2020-10-02 MED ORDER — PROPOFOL 10 MG/ML IV BOLUS
INTRAVENOUS | Status: AC
  Filled 2020-10-02: qty 20

## 2020-10-02 MED ORDER — BUPIVACAINE LIPOSOME 1.3 % IJ SUSP
INTRAMUSCULAR | Status: DC | PRN
  Administered 2020-10-02: 10 mL

## 2020-10-02 MED ORDER — STERILE WATER FOR IRRIGATION IR SOLN
Status: DC | PRN
  Administered 2020-10-02: 2000 mL

## 2020-10-02 MED ORDER — EPHEDRINE SULFATE-NACL 50-0.9 MG/10ML-% IV SOSY
PREFILLED_SYRINGE | INTRAVENOUS | Status: DC | PRN
  Administered 2020-10-02 (×2): 10 mg via INTRAVENOUS

## 2020-10-02 MED ORDER — SODIUM CHLORIDE 0.9% FLUSH
INTRAVENOUS | Status: DC | PRN
  Administered 2020-10-02: 50 mL

## 2020-10-02 MED ORDER — CEFAZOLIN SODIUM-DEXTROSE 2-4 GM/100ML-% IV SOLN
2.0000 g | INTRAVENOUS | Status: AC
  Administered 2020-10-02: 2 g via INTRAVENOUS
  Filled 2020-10-02: qty 100

## 2020-10-02 MED ORDER — LACTATED RINGERS IV BOLUS
500.0000 mL | Freq: Once | INTRAVENOUS | Status: AC
  Administered 2020-10-02: 500 mL via INTRAVENOUS

## 2020-10-02 MED ORDER — 0.9 % SODIUM CHLORIDE (POUR BTL) OPTIME
TOPICAL | Status: DC | PRN
  Administered 2020-10-02: 1000 mL

## 2020-10-02 MED ORDER — OXYCODONE HCL 5 MG/5ML PO SOLN: 5.0000 mg | Freq: Once | ORAL | Status: DC | PRN

## 2020-10-02 MED ORDER — MEPERIDINE HCL 50 MG/ML IJ SOLN: 6.2500 mg | INTRAMUSCULAR | Status: DC | PRN

## 2020-10-02 MED ORDER — PROPOFOL 500 MG/50ML IV EMUL
INTRAVENOUS | Status: DC | PRN
  Administered 2020-10-02: 30 ug/kg/min via INTRAVENOUS

## 2020-10-02 MED ORDER — MIDAZOLAM HCL 5 MG/5ML IJ SOLN
INTRAMUSCULAR | Status: DC | PRN
  Administered 2020-10-02: 2 mg via INTRAVENOUS

## 2020-10-02 MED ORDER — TRANEXAMIC ACID-NACL 1000-0.7 MG/100ML-% IV SOLN
1000.0000 mg | INTRAVENOUS | Status: AC
  Administered 2020-10-02: 1000 mg via INTRAVENOUS
  Filled 2020-10-02: qty 100

## 2020-10-02 MED ORDER — MIDAZOLAM HCL 2 MG/2ML IJ SOLN
INTRAMUSCULAR | Status: AC
  Filled 2020-10-02: qty 2

## 2020-10-02 MED ORDER — ACETAMINOPHEN 160 MG/5ML PO SOLN: 325.0000 mg | ORAL | Status: DC | PRN

## 2020-10-02 MED ORDER — LACTATED RINGERS IV SOLN: INTRAVENOUS | Status: DC

## 2020-10-02 MED ORDER — METHOCARBAMOL 500 MG PO TABS: 500.0000 mg | ORAL_TABLET | Freq: Four times a day (QID) | ORAL | Status: DC | PRN

## 2020-10-02 MED ORDER — GLYCOPYRROLATE 0.2 MG/ML IJ SOLN
INTRAMUSCULAR | Status: DC | PRN
  Administered 2020-10-02 (×2): .2 mg via INTRAVENOUS

## 2020-10-02 MED ORDER — ONDANSETRON HCL 4 MG/2ML IJ SOLN: 4.0000 mg | Freq: Once | INTRAMUSCULAR | Status: DC | PRN

## 2020-10-02 MED ORDER — LIDOCAINE HCL (CARDIAC) PF 100 MG/5ML IV SOSY
PREFILLED_SYRINGE | INTRAVENOUS | Status: DC | PRN
  Administered 2020-10-02: 50 mg via INTRAVENOUS

## 2020-10-02 MED ORDER — ACETAMINOPHEN 325 MG PO TABS: 325.0000 mg | ORAL_TABLET | ORAL | Status: DC | PRN

## 2020-10-02 MED ORDER — ORAL CARE MOUTH RINSE
15.0000 mL | Freq: Once | OROMUCOSAL | Status: AC
  Administered 2020-10-02: 15 mL via OROMUCOSAL

## 2020-10-02 MED ORDER — POVIDONE-IODINE 10 % EX SWAB
2.0000 "application " | Freq: Once | CUTANEOUS | Status: AC
  Administered 2020-10-02: 2 via TOPICAL

## 2020-10-02 MED ORDER — OXYCODONE HCL 5 MG PO TABS: 5.0000 mg | ORAL_TABLET | Freq: Once | ORAL | Status: DC | PRN

## 2020-10-02 MED ORDER — EPHEDRINE 5 MG/ML INJ
INTRAVENOUS | Status: AC
  Filled 2020-10-02: qty 10

## 2020-10-02 MED ORDER — ONDANSETRON HCL 4 MG/2ML IJ SOLN
INTRAMUSCULAR | Status: DC | PRN
  Administered 2020-10-02: 4 mg via INTRAVENOUS

## 2020-10-02 MED ORDER — FENTANYL CITRATE (PF) 100 MCG/2ML IJ SOLN: 25.0000 ug | INTRAMUSCULAR | Status: DC | PRN

## 2020-10-02 MED ORDER — PROPOFOL 10 MG/ML IV BOLUS
INTRAVENOUS | Status: DC | PRN
  Administered 2020-10-02 (×2): 50 mg via INTRAVENOUS
  Administered 2020-10-02: 30 mg via INTRAVENOUS

## 2020-10-02 MED ORDER — PROPOFOL 1000 MG/100ML IV EMUL
INTRAVENOUS | Status: AC
  Filled 2020-10-02: qty 100

## 2020-10-02 MED ORDER — CHLORHEXIDINE GLUCONATE 0.12 % MT SOLN: 15.0000 mL | Freq: Once | OROMUCOSAL | Status: AC

## 2020-10-02 MED ORDER — BUPIVACAINE IN DEXTROSE 0.75-8.25 % IT SOLN
INTRATHECAL | Status: DC | PRN
  Administered 2020-10-02: 1.8 mL via INTRATHECAL

## 2020-10-02 MED ORDER — LIDOCAINE HCL (PF) 2 % IJ SOLN
INTRAMUSCULAR | Status: AC
  Filled 2020-10-02: qty 5

## 2020-10-02 MED ORDER — LACTATED RINGERS IV BOLUS: 250.0000 mL | Freq: Once | INTRAVENOUS | Status: DC

## 2020-10-02 MED ORDER — ALBUMIN HUMAN 5 % IV SOLN: INTRAVENOUS | Status: DC | PRN

## 2020-10-02 MED ORDER — OXYCODONE HCL 5 MG/5ML PO SOLN
5.0000 mg | Freq: Once | ORAL | Status: DC | PRN
Start: 2020-10-02 — End: 2020-10-02

## 2020-10-02 MED ORDER — BUPIVACAINE-EPINEPHRINE (PF) 0.25% -1:200000 IJ SOLN
INTRAMUSCULAR | Status: AC
  Filled 2020-10-02: qty 30

## 2020-10-02 MED ORDER — FENTANYL CITRATE (PF) 100 MCG/2ML IJ SOLN
INTRAMUSCULAR | Status: DC | PRN
  Administered 2020-10-02: 50 ug via INTRAVENOUS

## 2020-10-02 MED ORDER — SODIUM CHLORIDE (PF) 0.9 % IJ SOLN
INTRAMUSCULAR | Status: AC
  Filled 2020-10-02: qty 50

## 2020-10-02 MED ORDER — ONDANSETRON HCL 4 MG/2ML IJ SOLN
INTRAMUSCULAR | Status: AC
  Filled 2020-10-02: qty 2

## 2020-10-02 MED ORDER — FENTANYL CITRATE (PF) 100 MCG/2ML IJ SOLN
INTRAMUSCULAR | Status: AC
  Filled 2020-10-02: qty 2

## 2020-10-02 SURGICAL SUPPLY — 48 items
BAG DECANTER FOR FLEXI CONT (MISCELLANEOUS) ×2 IMPLANT
BLADE SAW SGTL 18X1.27X75 (BLADE) ×2 IMPLANT
BLADE SURG SZ10 CARB STEEL (BLADE) ×4 IMPLANT
CNTNR URN SCR LID CUP LEK RST (MISCELLANEOUS) ×1 IMPLANT
CONT SPEC 4OZ STRL OR WHT (MISCELLANEOUS) ×2
COVER PERINEAL POST (MISCELLANEOUS) ×2 IMPLANT
COVER SURGICAL LIGHT HANDLE (MISCELLANEOUS) ×2 IMPLANT
COVER WAND RF STERILE (DRAPES) ×2 IMPLANT
CUP ACETBLR 54 OD 100 SERIES (Hips) ×1 IMPLANT
DECANTER SPIKE VIAL GLASS SM (MISCELLANEOUS) ×4 IMPLANT
DRAPE ORTHO SPLIT 77X108 STRL (DRAPES)
DRAPE STERI IOBAN 125X83 (DRAPES) ×2 IMPLANT
DRAPE SURG ORHT 6 SPLT 77X108 (DRAPES) IMPLANT
DRAPE U-SHAPE 47X51 STRL (DRAPES) ×4 IMPLANT
DRSG AQUACEL AG ADV 3.5X10 (GAUZE/BANDAGES/DRESSINGS) ×2 IMPLANT
DURAPREP 26ML APPLICATOR (WOUND CARE) ×2 IMPLANT
ELECT BLADE TIP CTD 4 INCH (ELECTRODE) ×2 IMPLANT
ELECT REM PT RETURN 15FT ADLT (MISCELLANEOUS) ×2 IMPLANT
ELIMINATOR HOLE APEX DEPUY (Hips) ×1 IMPLANT
GLOVE BIO SURGEON STRL SZ7.5 (GLOVE) ×2 IMPLANT
GLOVE BIO SURGEON STRL SZ8.5 (GLOVE) ×2 IMPLANT
GLOVE BIOGEL PI IND STRL 9 (GLOVE) ×1 IMPLANT
GLOVE BIOGEL PI INDICATOR 9 (GLOVE) ×1
GLOVE SRG 8 PF TXTR STRL LF DI (GLOVE) ×1 IMPLANT
GLOVE SURG UNDER POLY LF SZ8 (GLOVE) ×2
GOWN STRL REUS W/TWL XL LVL3 (GOWN DISPOSABLE) ×4 IMPLANT
HEAD CERAMIC DELTA 36 PLUS 1.5 (Hips) ×1 IMPLANT
HOLDER FOLEY CATH W/STRAP (MISCELLANEOUS) ×2 IMPLANT
KIT TURNOVER KIT A (KITS) IMPLANT
LINER NEUTRAL 54X36MM PLUS 4 (Hips) ×1 IMPLANT
MANIFOLD NEPTUNE II (INSTRUMENTS) ×2 IMPLANT
NDL HYPO 21X1.5 SAFETY (NEEDLE) ×2 IMPLANT
NEEDLE HYPO 21X1.5 SAFETY (NEEDLE) ×4 IMPLANT
NS IRRIG 1000ML POUR BTL (IV SOLUTION) ×2 IMPLANT
PACK ANTERIOR HIP CUSTOM (KITS) ×2 IMPLANT
PENCIL SMOKE EVACUATOR (MISCELLANEOUS) IMPLANT
STEM FEM ACTIS HIGH SZ7 (Stem) ×1 IMPLANT
SUT ETHIBOND NAB CT1 #1 30IN (SUTURE) ×2 IMPLANT
SUT VIC AB 0 CT1 27 (SUTURE)
SUT VIC AB 0 CT1 27XBRD ANBCTR (SUTURE) IMPLANT
SUT VIC AB 1 CTX 36 (SUTURE) ×2
SUT VIC AB 1 CTX36XBRD ANBCTR (SUTURE) ×1 IMPLANT
SUT VIC AB 2-0 CT1 27 (SUTURE)
SUT VIC AB 2-0 CT1 TAPERPNT 27 (SUTURE) IMPLANT
SUT VIC AB 3-0 CT1 27 (SUTURE) ×2
SUT VIC AB 3-0 CT1 TAPERPNT 27 (SUTURE) ×1 IMPLANT
SYR CONTROL 10ML LL (SYRINGE) ×6 IMPLANT
TRAY FOLEY MTR SLVR 16FR STAT (SET/KITS/TRAYS/PACK) IMPLANT

## 2020-10-02 NOTE — Anesthesia Procedure Notes (Signed)
Procedure Name: MAC Date/Time: 10/02/2020 7:15 AM Performed by: Michele Rockers, CRNA Pre-anesthesia Checklist: Patient identified, Emergency Drugs available, Suction available, Timeout performed and Patient being monitored Patient Re-evaluated:Patient Re-evaluated prior to induction Oxygen Delivery Method: Simple face mask

## 2020-10-02 NOTE — Anesthesia Procedure Notes (Signed)
Spinal  Patient location during procedure: OR Start time: 10/02/2020 7:19 AM End time: 10/02/2020 7:20 AM Staffing Anesthesiologist: Janeece Riggers, MD Preanesthetic Checklist Completed: patient identified, IV checked, site marked, risks and benefits discussed, surgical consent, monitors and equipment checked, pre-op evaluation and timeout performed Spinal Block Patient position: sitting Prep: DuraPrep Patient monitoring: heart rate, cardiac monitor, continuous pulse ox and blood pressure Approach: midline Location: L3-4 Injection technique: single-shot Needle Needle type: Sprotte  Needle gauge: 24 G Needle length: 9 cm Assessment Sensory level: T4

## 2020-10-02 NOTE — Op Note (Signed)
PATIENT ID:      David [name]  MRN:     [id number] DOB/AGE:    09-03-1945 / 75 y.o.  OPERATIVE REPORT   DATE OF PROCEDURE:  10/02/2020      PREOPERATIVE DIAGNOSIS:  RIGHT HIP OSTEOARTHRITIS                                                         POSTOPERATIVE DIAGNOSIS:  Same                                                         PROCEDURE: Anterior R total hip arthroplasty using a 54 mm DePuy Pinnacle  Cup, [employer], 0-degree polyethylene liner, a +1.5 mm x 20mm ceramic head, a 7hi Depuy Actis stem  SURGEON: Kerin Salen  ASSISTANT:   Kerry Hough. [employer]  (present throughout entire procedure and necessary for timely completion of the procedure)   ANESTHESIA: Spinal, Exparel 133mg  injection BLOOD LOSS: 300 cc FLUID REPLACEMENT: 1500 cc crystalloid TRANEXAMIC ACID: 1gm IV, 2gm Topical COMPLICATIONS: none    INDICATIONS FOR PROCEDURE: A 75 y.o. year-old With  RIGHT HIP OSTEOARTHRITIS   for 2 years, x-rays show bone-on-bone arthritic changes, and osteophytes. Despite conservative measures with observation, anti-inflammatory medicine, narcotics, use of a cane, has severe unremitting pain and can ambulate only a few blocks before resting. Patient desires elective R total hip arthroplasty to decrease pain and increase function. The risks, benefits, and alternatives were discussed at length including but not limited to the risks of infection, bleeding, nerve injury, stiffness, blood clots, the need for revision surgery, cardiopulmonary complications, among others, and they were willing to proceed. Questions answered      PROCEDURE IN DETAIL: The patient was identified by armband,   received preoperative IV antibiotics in the holding area at [facility], taken to the operating room , appropriate anesthetic monitors   were attached and anesthesia was induced with the patient on the gurney. HANA boots were applied to the feet, and the patient  was transferred to the HANA  table with a peroneal post and support underneath the non-operative leg. Theoperative lower extremity was then prepped and draped in the usual sterile fashion from just above the iliac crest to the knee. And a timeout procedure was performed. Kerry Hough. [facility] was present and scrubbed throughout the case, critical for assistance with, positioning, exposure, retraction, instrumentation, and closure.Skin along incision area was injected with 10 cc of Exparel solution. We then made a 15 cm incision along the interval at the leading edge of the tensor fascia lata of starting at 2 cm lateral to the ASIS. Small bleeders in the skin and subcutaneous tissue identified and cauterized we dissected down to the fascia and made an incision in the fascia allowing [place] to elevate the fascia of the tensor muscle and exploited the interval between the rectus and the tensor fascia lata. A Cobra retractor was then placed along the superior neck of the femur. A cerebellar retractor was used to expose the interval between the tensor fascia lata and the rectus femoris.  We identified and  cauterized the ascending branch of the anterior circumflex artery. A second Cobra retractor along the inferior neck of the femur. A small Hohmann retractor was placed underneath the origin of the rectus femoris, giving [place] good medial exposure. Using Rongu[place] fatty tissue was removed from in front of the anterior capsule. The capsule was then incised, starting out at the superior anterior rim of the acetabulum going laterally along the anterior neck. The capsule was then teed along the neck superiorly and inferiorly. Electrocautery was used to release capsule from the anterior and medial neck of the femur to allow external rotation. Cobra retractors were then placed along the inferior and superior neck allowing [place] to perform a standard neck cut and removed the femoral head with a power corkscrew. We then placed a medium bent homan retractor in the  cotyloid notch and posteriorly along the acetabular rim a narrow Cobra retractor. Exposed labral tissue and osteophytes were then removed. We then sequentially reamed up to a 53 mm basket reamer obtaining good coverage in all quadrants, verified by C-arm imaging. Under C-arm control we then hammered into place a 54 mm Pinnacle cup in 45 of abduction and 15 of anteversion. The cup seated nicely and required no supplemental screws. We then placed a central hole Eliminator and a 0 polyethylene liner. The foot was then externally rotated to 130-140. The limb was extended and adducted to the floor, delivering the proximal femur up into the wound. A medium curved Hohmann retractor was placed over the greater trochanter and a long Homan retractor along the posterior femoral neck completing the exposure and lateralizing the femur. We then performed releases superiorly and and inferiorly of the capsule going back to the pirformis fossa superiorly and to the lesser trochanter inferiorly. We then entered the proximal femur with the box cutting offset chisel followed by, a canal sounder, the chili pepper and broaching up to a 7 broach. This seated nicely and we reamed the calcar. A trial reduction was performed with a 1.5 mm X 36 mm head.The limb lengths were excellent the hip was stable in 90 of external rotation. At this point the trial components removed and we hammered into place a # hi  Offset Actis stem with G[employer] coating. A + 1.5 mm x 36 head was then hammered into place. The hip was reduced and final C-arm images obtained. The wound was thoroughly irrigated with normal saline solution. We repaired the ant capsule and the tensor fascia lot a with running 0 vicryl suture. the subcutaneous tissue was closed with 2-0 and 3-0 Vicryl suture followed by an Aquacil dressing. At this point the patient was awaken and transferred to hospital gurney without difficulty.   Kerin Salen 10/02/2020, 7:17 AM

## 2020-10-02 NOTE — Evaluation (Signed)
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  Clinical Impression  Pt is a 75yo male s/p Rt THA POD 0. Pt reports that he is independent with mobility at baseline. Pt required MIN guard and verbal cues for sit to stand transfers. Pt required MIN guard-supervision for ambulation 21ft with verbal cues to maintain safe proximity to RW and step to gait pattern, pt progressed to step through pattern with no LOB. Pt was able to safely perform stair negotiation with MIN assist- MIN guard for safety and RW management and cues for sequencing. Pt was able to verbalize safe guarding position for family members at home. Pt's wife and son will be  available to assist. Pt is currently at a safe mobility level for discharge. Recommend home with family support. Pt will benefit from skilled PT to increase independence and safety with mobility.      Follow Up Recommendations Outpatient PT;Follow surgeon's recommendation for DC plan and follow-up therapies    Equipment Recommendations  None recommended by PT (pt owns RW)    Recommendations for Other Services       Precautions / Restrictions Precautions Precautions: Fall Restrictions Weight Bearing Restrictions: No Other Position/Activity Restrictions: WBAT      Mobility  Bed Mobility Overal bed mobility: Modified Independent             General bed mobility comments: pt with use of Bil UEs to scoot to EOB    Transfers Overall transfer level: Needs assistance Equipment used: Rolling walker (2 wheeled) Transfers: Sit to/from Stand Sit to Stand: Min guard         General transfer comment: MIN guard for safety with cues for safe hand  placement  Ambulation/Gait Ambulation/Gait assistance: Min guard;Supervision Gait Distance (Feet): 200 Feet Assistive device: Rolling walker (2 wheeled) Gait Pattern/deviations: Step-to pattern;Step-through pattern;Decreased weight shift to right Gait velocity: fair   General Gait Details: pt ambulated with MIN guard progressing to supervision with cues for step to gait pattern, progresing to step through and cues to maintain safe proximity to RW, no LOB observed.  Stairs Stairs: Yes Stairs assistance: Min assist;Min guard Stair Management: No rails;One rail Left;Forwards;With walker Number of Stairs: 6 (2x3) General stair comments: pt performed safe stair negotiation with use of RW and no handrails and with single handrail with cues for sequencing and MIN assist to MIN guard for safety and RW management. Pt was able to verbalize safe guarding position for family memebrs at home.  Wheelchair Mobility    Modified Rankin (Stroke Patients Only)       Balance Overall balance assessment: Needs assistance Sitting-balance support: Feet supported Sitting balance-Leahy Scale: Good     Standing balance support: Bilateral upper extremity supported;During functional activity Standing balance-Leahy Scale: Poor Standing balance comment: use of external support                             Pertinent Vitals/Pain Pain Assessment: 0-10 Pain Score: 4  Pain Location: Rt hip Pain Descriptors / Indicators: Sore;Tender Pain Intervention(s): Limited activity within patient's tolerance;Monitored during session;Repositioned    Home Living Family/patient expects to be discharged to:: Private residence Living Arrangements: Spouse/significant  other Available Help at Discharge: Family Type of Home: House Home Access: Stairs to enter Entrance Stairs-Rails: None Entrance Stairs-Number of Steps: 3 Home Layout: Two level;Bed/bath upstairs Home Equipment: [place] - 2 wheels;Shower seat -  built in Additional Comments: pt's wife and son will be available to assist    Prior Function Level of Independence: Independent               Hand Dominance   Dominant Hand: Right    Extremity/Trunk Assessment   Upper Extremity Assessment Upper Extremity Assessment: Overall WFL for tasks assessed    Lower Extremity Assessment Lower Extremity Assessment: RLE deficits/detail RLE Deficits / Details: pt with 4/5 Rt quad set strength RLE Sensation: WNL RLE Coordination: WNL    Cervical / Trunk Assessment Cervical / Trunk Assessment: Normal  Communication   Communication: No difficulties  Cognition Arousal/Alertness: Awake/alert Behavior During Therapy: WFL for tasks assessed/performed Overall Cognitive Status: Within Functional Limits for tasks assessed                                        General Comments      Exercises Total Joint Exercises Ankle Circles/Pumps: AROM;Both;20 reps;Seated Quad Sets: AROM;Right;5 reps;Seated Short Arc Quad: AROM;Right;5 reps;Seated Heel Slides: AROM;Right;5 reps;Seated Hip ABduction/ADduction: AROM;Right;5 reps;Seated Long Arc Quad: AROM;Right;5 reps;Seated   Assessment/Plan    PT Assessment Patient needs continued PT services  PT Problem List Decreased strength;Decreased range of motion;Decreased activity tolerance;Decreased balance;Decreased mobility;Decreased knowledge of use of DME;Decreased safety awareness;Pain       PT Treatment Interventions DME instruction;Gait training;Stair training;Functional mobility training;Therapeutic activities;Therapeutic exercise;Balance training;Patient/family education    PT Goals (Current goals can be found in the Care Plan section)  Acute Rehab PT Goals Patient Stated Goal: return to traveling PT Goal Formulation: With patient Time For Goal Achievement: 10/09/20 Potential to Achieve Goals: Good    Frequency 7X/week   Barriers to discharge        Co-evaluation                AM-PAC PT "6 Clicks" Mobility  Outcome Measure Help needed turning from your back to your side while in a flat bed without using bedrails?: None Help needed moving from lying on your back to sitting on the side of a flat bed without using bedrails?: None Help needed moving to and from a bed to a chair (including a wheelchair)?: A Little Help needed standing up from a chair using your arms (e.g., wheelchair or bedside chair)?: A Little Help needed to walk in hospital room?: A Little Help needed climbing 3-5 steps with a railing? : A Little 6 Click Score: 20    End of Session Equipment Utilized During Treatment: Gait belt Activity Tolerance: Patient tolerated treatment well Patient left: in chair;with call bell/phone within reach Nurse Communication: Mobility status PT Visit Diagnosis: Unsteadiness on feet (R26.81);Muscle weakness (generalized) (M62.81);Pain Pain - Right/Left: Right Pain - part of body: Hip    Time: 3016-0109 PT Time Calculation (min) (ACUTE ONLY): 30 min   Charges:             Elna Breslow, SPT  Acute rehab    David [name] 10/02/2020, 3:01 PM

## 2020-10-02 NOTE — Interval H&P Note (Signed)
History and Physical Interval Note:  10/02/2020 7:15 AM  David [name]  has presented today for surgery, with the diagnosis of RIGHT HIP OSTEOARTHRITIS.  The various methods of treatment have been discussed with the patient and family. After consideration of risks, benefits and other options for treatment, the patient has consented to  Procedure(s) with comments: RIGHT HIP ARTHROPLASTY ANTERIOR APPROACH (Right) - 3E BED as a surgical intervention.  The patient's history has been reviewed, patient examined, no change in status, stable for surgery.  I have reviewed the patient's chart and labs.  Questions were answered to the patient's satisfaction.     Kerin Salen

## 2020-10-02 NOTE — Discharge Instructions (Addendum)
General Anesthesia, Adult, Care After This sheet gives you information about how to care for yourself after your procedure. Your health care provider may also give you more specific instructions. If you have problems or questions, contact your health care provider. What can I expect after the procedure? After the procedure, the following side effects are common:  Pain or discomfort at the IV site.  Nausea.  Vomiting.  Sore throat.  Trouble concentrating.  Feeling cold or chills.  Feeling weak or tired.  Sleepiness and fatigue.  Soreness and body aches. These side effects can affect parts of the body that were not involved in surgery. Follow these instructions at home: For the time period you were told by your health care provider:  Rest.  Do not participate in activities where you could fall or become injured.  Do not drive or use machinery.  Do not drink alcohol.  Do not take sleeping pills or medicines that cause drowsiness.  Do not make important decisions or sign legal documents.  Do not take care of children on your own.   Eating and drinking  Follow any instructions from your health care provider about eating or drinking restrictions.  When you feel hungry, start by eating small amounts of foods that are soft and easy to digest (bland), such as toast. Gradually return to your regular diet.  Drink enough fluid to keep your urine pale yellow.  If you vomit, rehydrate by drinking water, juice, or clear broth. General instructions  If you have sleep apnea, surgery and certain medicines can increase your risk for breathing problems. Follow instructions from your health care provider about wearing your sleep device: ? Anytime you are sleeping, including during daytime naps. ? While taking prescription pain medicines, sleeping medicines, or medicines that make you drowsy.  Have a responsible adult stay with you for the time you are told. It is important to have  someone help care for you until you are awake and alert.  Return to your normal activities as told by your health care provider. Ask your health care provider what activities are safe for you.  Take over-the-counter and prescription medicines only as told by your health care provider.  If you smoke, do not smoke without supervision.  Keep all follow-up visits as told by your health care provider. This is important. Contact a health care provider if:  You have nausea or vomiting that does not get better with medicine.  You cannot eat or drink without vomiting.  You have pain that does not get better with medicine.  You are unable to pass urine.  You develop a skin rash.  You have a fever.  You have redness around your IV site that gets worse. Get help right away if:  You have difficulty breathing.  You have chest pain.  You have blood in your urine or stool, or you vomit blood. Summary  After the procedure, it is common to have a sore throat or nausea. It is also common to feel tired.  Have a responsible adult stay with you for the time you are told. It is important to have someone help care for you until you are awake and alert.  When you feel hungry, start by eating small amounts of foods that are soft and easy to digest (bland), such as toast. Gradually return to your regular diet.  Drink enough fluid to keep your urine pale yellow.  Return to your normal activities as told by your health care provider.   Ask your health care provider what activities are safe for you. This information is not intended to replace advice given to you by your health care provider. Make sure you discuss any questions you have with your health care provider. Document Revised: 05/04/2020 Document Reviewed: 12/02/2019 Elsevier Patient Education  2021 [place] AFTER JOINT REPLACEMENT   o Remove items at home which could result in a fall. This includes throw rugs or furniture  in walking pathways o ICE to the affected joint every three hours while awake for 30 minutes at a time, for at least the first 3-5 days, and then as needed for pain and swelling.  Continue to use ice for pain and swelling. You may notice swelling that will progress down to the foot and ankle.  This is normal after surgery.  Elevate your leg when you are not up walking on it.   o Continue to use the breathing machine you got in the hospital (incentive spirometer) which will help keep your temperature down.  It is common for your temperature to cycle up and down following surgery, especially at night when you are not up moving around and exerting yourself.  The breathing machine keeps your lungs expanded and your temperature down.   DIET:  As you were doing prior to hospitalization, we recommend a well-balanced diet.  DRESSING / WOUND CARE / SHOWERING  Keep the surgical dressing until follow up.  The dressing is water proof, so you can shower without any extra covering.  IF THE DRESSING FALLS OFF or the wound gets wet inside, change the dressing with sterile gauze.  Please use good hand washing techniques before changing the dressing.  Do not use any lotions or creams on the incision until instructed by your surgeon.    ACTIVITY  o Increase activity slowly as tolerated, but follow the weight bearing instructions below.   o No driving for 6 weeks or until further direction given by your physician.  You cannot drive while taking narcotics.  o No lifting or carrying greater than 10 lbs. until further directed by your surgeon. o Avoid periods of inactivity such as sitting longer than an hour when not asleep. This helps prevent blood clots.  o You may return to work once you are authorized by your doctor.     WEIGHT BEARING   Weight bearing as tolerated with assist device (walker, cane, etc) as directed, use it as long as suggested by your surgeon or therapist, typically at least 4-6  weeks.   EXERCISES  Results after joint replacement surgery are often greatly improved when you follow the exercise, range of motion and muscle strengthening exercises prescribed by your doctor. Safety measures are also important to protect the joint from further injury. Any time any of these exercises cause you to have increased pain or swelling, decrease what you are doing until you are comfortable again and then slowly increase them. If you have problems or questions, call your caregiver or physical therapist for advice.   Rehabilitation is important following a joint replacement. After just a few days of immobilization, the muscles of the leg can become weakened and shrink (atrophy).  These exercises are designed to build up the tone and strength of the thigh and leg muscles and to improve motion. Often times heat used for twenty to thirty minutes before working out will loosen up your tissues and help with improving the range of motion but do not use heat for the first two weeks  following surgery (sometimes heat can increase post-operative swelling).   These exercises can be done on a training (exercise) mat, on the floor, on a table or on a bed. Use whatever works the best and is most comfortable for you.    Use music or television while you are exercising so that the exercises are a pleasant break in your day. This will make your life better with the exercises acting as a break in your routine that you can look forward to.   Perform all exercises about fifteen times, three times per day or as directed.  You should exercise both the operative leg and the other leg as well.  Exercises include:   . Quad Sets - Tighten up the muscle on the front of the thigh (Quad) and hold for 5-10 seconds.   . Straight Leg Raises - With your knee straight (if you were given a brace, keep it on), lift the leg to 60 degrees, hold for 3 seconds, and slowly lower the leg.  Perform this exercise against resistance  later as your leg gets stronger.  . Leg Slides: Lying on your back, slowly slide your foot toward your buttocks, bending your knee up off the floor (only go as far as is comfortable). Then slowly slide your foot back down until your leg is flat on the floor again.  [place] Haring Wings: Lying on your back spread your legs to the side as far apart as you can without causing discomfort.  . Hamstring Strength:  Lying on your back, push your heel against the floor with your leg straight by tightening up the muscles of your buttocks.  Repeat, but this time bend your knee to a comfortable angle, and push your heel against the floor.  You may put a pillow under the heel to make it more comfortable if necessary.   A rehabilitation program following joint replacement surgery can speed recovery and prevent re-injury in the future due to weakened muscles. Contact your doctor or a physical therapist for more information on knee rehabilitation.    CONSTIPATION  Constipation is defined medically as fewer than three stools per week and severe constipation as less than one stool per week.  Even if you have a regular bowel pattern at home, your normal regimen is likely to be disrupted due to multiple reasons following surgery.  Combination of anesthesia, postoperative narcotics, change in appetite and fluid intake all can affect your bowels.   YOU MUST use at least one of the following options; they are listed in order of increasing strength to get the job done.  They are all available over the counter, and you may need to use some, POSSIBLY even all of these options:    Drink plenty of fluids (prune juice may be helpful) and high fiber foods Colace 100 mg by mouth twice a day  Senokot for constipation as directed and as needed Dulcolax (bisacodyl), take with full glass of water  Miralax (polyethylene glycol) once or twice a day as needed.  If you have tried all these things and are unable to have a bowel movement in the  first 3-4 days after surgery call either your surgeon or your primary doctor.    If you experience loose stools or diarrhea, hold the medications until you stool forms back up.  If your symptoms do not get better within 1 week or if they get worse, check with your doctor.  If you experience "the worst abdominal pain ever" or develop  nausea or vomiting, please contact the office immediately for further recommendations for treatment.   ITCHING:  If you experience itching with your medications, try taking only a single pain pill, or even half a pain pill at a time.  You can also use Benadryl over the counter for itching or also to help with sleep.   TED HOSE STOCKINGS:  Use stockings on both legs until for at least 2 weeks or as directed by physician office. They may be removed at night for sleeping.  MEDICATIONS:  See your medication summary on the "After Visit Summary" that nursing will review with you.  You may have some home medications which will be placed on hold until you complete the course of blood thinner medication.  It is important for you to complete the blood thinner medication as prescribed.  PRECAUTIONS:  If you experience chest pain or shortness of breath - call 911 immediately for transfer to the hospital emergency department.   If you develop a fever greater that 101 F, purulent drainage from wound, increased redness or drainage from wound, foul odor from the wound/dressing, or calf pain - CONTACT YOUR SURGEON.                                                   FOLLOW-UP APPOINTMENTS:  If you do not already have a post-op appointment, please call the office for an appointment to be seen by your surgeon.  Guidelines for how soon to be seen are listed in your "After Visit Summary", but are typically between 1-4 weeks after surgery.  OTHER INSTRUCTIONS:   Knee Replacement:  Do not place pillow under knee, focus on keeping the knee straight while resting. CPM instructions: 0-90 degrees,  2 hours in the morning, 2 hours in the afternoon, and 2 hours in the evening. Place foam block, curve side up under heel at all times except when in CPM or when walking.  DO NOT modify, tear, cut, or change the foam block in any way.   DENTAL ANTIBIOTICS:  In most cases prophylactic antibiotics for Dental procdeures after total joint surgery are not necessary.  Exceptions are as follows:  1. History of prior total joint infection  2. Severely immunocompromised (Organ Transplant, cancer chemotherapy, Rheumatoid biologic meds such as [place])  3. Poorly controlled diabetes (A1C &gt; 8.0, blood glucose over 200)  If you have one of these conditions, contact your surgeon for an antibiotic prescription, prior to your dental procedure.   MAKE SURE YOU:  . Understand these instructions.  . Get help right away if you are not doing well or get worse.    Thank you for letting us be a part of your medical care team.  It is a privilege we respect greatly.  We hope these instructions will help you stay on track for a fast and full recovery!

## 2020-10-02 NOTE — Transfer of Care (Signed)
Immediate Anesthesia Transfer of Care Note  Patient: David [name]  Procedure(s) Performed: RIGHT HIP ARTHROPLASTY ANTERIOR APPROACH (Right Hip)  Patient Location: PACU  Anesthesia Type:Spinal  Level of Consciousness: drowsy, patient cooperative and responds to stimulation  Airway & Oxygen Therapy: Patient Spontanous Breathing  Post-op Assessment: Report given to RN and Post -op Vital signs reviewed and stable  Post vital signs: Reviewed and stable  Last Vitals:  Vitals Value Taken Time  BP 92/65 10/02/20 0908  Temp    Pulse 57 10/02/20 0909  Resp 16 10/02/20 0909  SpO2 95 % 10/02/20 0909  Vitals shown include unvalidated device data.  Last Pain:  Vitals:   10/02/20 0613  TempSrc: Oral         Complications: No complications documented.

## 2020-10-03 ENCOUNTER — Encounter (HOSPITAL_COMMUNITY): Payer: Self-pay | Admitting: Orthopedic Surgery

## 2020-10-03 NOTE — Anesthesia Postprocedure Evaluation (Signed)
Anesthesia Post Note  Patient: David [name]  Procedure(s) Performed: RIGHT HIP ARTHROPLASTY ANTERIOR APPROACH (Right Hip)     Patient location during evaluation: PACU Anesthesia Type: MAC and Spinal Level of consciousness: oriented and awake and alert Pain management: pain level controlled Vital Signs Assessment: post-procedure vital signs reviewed and stable Respiratory status: spontaneous breathing, respiratory function stable and patient connected to nasal cannula oxygen Cardiovascular status: blood pressure returned to baseline and stable Postop Assessment: no headache, no backache and no apparent nausea or vomiting Anesthetic complications: no   No complications documented.  Last Vitals:  Vitals:   10/02/20 1354 10/02/20 1515  BP:  (!) 152/90  Pulse: (!) 59 (!) 50  Resp: 16 17  Temp:  37 C  SpO2: 95% 97%    Last Pain:  Vitals:   10/02/20 1515  TempSrc:   PainSc: 0-No pain                 Antonique Langford

## 2020-10-04 DIAGNOSIS — M25651 Stiffness of right hip, not elsewhere classified: Secondary | ICD-10-CM | POA: Diagnosis not present

## 2020-10-04 DIAGNOSIS — R531 Weakness: Secondary | ICD-10-CM | POA: Diagnosis not present

## 2020-10-04 DIAGNOSIS — R262 Difficulty in walking, not elsewhere classified: Secondary | ICD-10-CM | POA: Diagnosis not present

## 2020-10-04 DIAGNOSIS — Z96641 Presence of right artificial hip joint: Secondary | ICD-10-CM | POA: Diagnosis not present

## 2020-10-05 ENCOUNTER — Ambulatory Visit (INDEPENDENT_AMBULATORY_CARE_PROVIDER_SITE_OTHER): Payer: Medicare Other

## 2020-10-05 ENCOUNTER — Other Ambulatory Visit: Payer: Self-pay

## 2020-10-05 DIAGNOSIS — Z96641 Presence of right artificial hip joint: Secondary | ICD-10-CM | POA: Diagnosis not present

## 2020-10-05 DIAGNOSIS — J4550 Severe persistent asthma, uncomplicated: Secondary | ICD-10-CM | POA: Diagnosis not present

## 2020-10-05 DIAGNOSIS — R531 Weakness: Secondary | ICD-10-CM | POA: Diagnosis not present

## 2020-10-05 DIAGNOSIS — M25651 Stiffness of right hip, not elsewhere classified: Secondary | ICD-10-CM | POA: Diagnosis not present

## 2020-10-05 DIAGNOSIS — R262 Difficulty in walking, not elsewhere classified: Secondary | ICD-10-CM | POA: Diagnosis not present

## 2020-10-05 MED ORDER — OMALIZUMAB 150 MG/ML ~~LOC~~ SOSY
300.0000 mg | PREFILLED_SYRINGE | Freq: Once | SUBCUTANEOUS | Status: AC
  Administered 2020-10-05: 300 mg via SUBCUTANEOUS

## 2020-10-05 NOTE — Progress Notes (Signed)
Have you been hospitalized within the last 10 days?  No Do you have a fever?  No Do you have a cough?  No Do you have a headache or sore throat? No Do you have your Epi Pen visible and is it within date?  Yes 

## 2020-10-11 DIAGNOSIS — R262 Difficulty in walking, not elsewhere classified: Secondary | ICD-10-CM | POA: Diagnosis not present

## 2020-10-11 DIAGNOSIS — M25651 Stiffness of right hip, not elsewhere classified: Secondary | ICD-10-CM | POA: Diagnosis not present

## 2020-10-11 DIAGNOSIS — Z96641 Presence of right artificial hip joint: Secondary | ICD-10-CM | POA: Diagnosis not present

## 2020-10-11 DIAGNOSIS — R531 Weakness: Secondary | ICD-10-CM | POA: Diagnosis not present

## 2020-10-12 DIAGNOSIS — Z471 Aftercare following joint replacement surgery: Secondary | ICD-10-CM | POA: Diagnosis not present

## 2020-10-12 DIAGNOSIS — M25561 Pain in right knee: Secondary | ICD-10-CM | POA: Diagnosis not present

## 2020-10-12 DIAGNOSIS — R262 Difficulty in walking, not elsewhere classified: Secondary | ICD-10-CM | POA: Diagnosis not present

## 2020-10-12 DIAGNOSIS — Z96641 Presence of right artificial hip joint: Secondary | ICD-10-CM | POA: Diagnosis not present

## 2020-10-12 DIAGNOSIS — M25651 Stiffness of right hip, not elsewhere classified: Secondary | ICD-10-CM | POA: Diagnosis not present

## 2020-10-12 DIAGNOSIS — R531 Weakness: Secondary | ICD-10-CM | POA: Diagnosis not present

## 2020-10-12 DIAGNOSIS — M25562 Pain in left knee: Secondary | ICD-10-CM | POA: Diagnosis not present

## 2020-10-17 DIAGNOSIS — R531 Weakness: Secondary | ICD-10-CM | POA: Diagnosis not present

## 2020-10-17 DIAGNOSIS — Z96641 Presence of right artificial hip joint: Secondary | ICD-10-CM | POA: Diagnosis not present

## 2020-10-17 DIAGNOSIS — R262 Difficulty in walking, not elsewhere classified: Secondary | ICD-10-CM | POA: Diagnosis not present

## 2020-10-17 DIAGNOSIS — M25651 Stiffness of right hip, not elsewhere classified: Secondary | ICD-10-CM | POA: Diagnosis not present

## 2020-10-19 DIAGNOSIS — M25561 Pain in right knee: Secondary | ICD-10-CM | POA: Diagnosis not present

## 2020-10-19 DIAGNOSIS — Z96651 Presence of right artificial knee joint: Secondary | ICD-10-CM | POA: Diagnosis not present

## 2020-10-19 DIAGNOSIS — R531 Weakness: Secondary | ICD-10-CM | POA: Diagnosis not present

## 2020-10-19 DIAGNOSIS — R262 Difficulty in walking, not elsewhere classified: Secondary | ICD-10-CM | POA: Diagnosis not present

## 2020-10-19 DIAGNOSIS — M25651 Stiffness of right hip, not elsewhere classified: Secondary | ICD-10-CM | POA: Diagnosis not present

## 2020-10-19 DIAGNOSIS — Z96641 Presence of right artificial hip joint: Secondary | ICD-10-CM | POA: Diagnosis not present

## 2020-10-23 ENCOUNTER — Telehealth: Payer: Self-pay | Admitting: Internal Medicine

## 2020-10-23 NOTE — Telephone Encounter (Signed)
Xolair Prefilled Syringe Order: 150mg  Prefilled Syringe:  #2 75mg  Prefilled Syringe: #n/a Ordered Date: 10/23/20 Expected date of arrival: 10/24/20 Ordered by: Diamond: Nigel Mormon

## 2020-10-24 DIAGNOSIS — M720 Palmar fascial fibromatosis [Dupuytren]: Secondary | ICD-10-CM | POA: Diagnosis not present

## 2020-10-24 DIAGNOSIS — Z96641 Presence of right artificial hip joint: Secondary | ICD-10-CM | POA: Diagnosis not present

## 2020-10-24 DIAGNOSIS — M65312 Trigger thumb, left thumb: Secondary | ICD-10-CM | POA: Diagnosis not present

## 2020-10-24 DIAGNOSIS — M65322 Trigger finger, left index finger: Secondary | ICD-10-CM | POA: Diagnosis not present

## 2020-10-24 DIAGNOSIS — M25651 Stiffness of right hip, not elsewhere classified: Secondary | ICD-10-CM | POA: Diagnosis not present

## 2020-10-24 DIAGNOSIS — R531 Weakness: Secondary | ICD-10-CM | POA: Diagnosis not present

## 2020-10-24 DIAGNOSIS — R200 Anesthesia of skin: Secondary | ICD-10-CM | POA: Diagnosis not present

## 2020-10-24 DIAGNOSIS — R262 Difficulty in walking, not elsewhere classified: Secondary | ICD-10-CM | POA: Diagnosis not present

## 2020-10-24 NOTE — Telephone Encounter (Signed)
Xolair Prefilled Syringe Received:  150mg  Prefilled Syringe >> quantity #2, lot # [id number], exp date 08/01/2021 75mg  Prefilled Syringe >> quantity #n/a Medication arrival date: 10/24/20 Received by: Caliana Spires,LPN

## 2020-10-26 DIAGNOSIS — R531 Weakness: Secondary | ICD-10-CM | POA: Diagnosis not present

## 2020-10-26 DIAGNOSIS — M25651 Stiffness of right hip, not elsewhere classified: Secondary | ICD-10-CM | POA: Diagnosis not present

## 2020-10-26 DIAGNOSIS — Z96641 Presence of right artificial hip joint: Secondary | ICD-10-CM | POA: Diagnosis not present

## 2020-10-26 DIAGNOSIS — R262 Difficulty in walking, not elsewhere classified: Secondary | ICD-10-CM | POA: Diagnosis not present

## 2020-10-30 ENCOUNTER — Other Ambulatory Visit (HOSPITAL_COMMUNITY): Payer: Self-pay | Admitting: Pharmacy Technician

## 2020-10-30 DIAGNOSIS — J4550 Severe persistent asthma, uncomplicated: Secondary | ICD-10-CM

## 2020-10-31 DIAGNOSIS — R262 Difficulty in walking, not elsewhere classified: Secondary | ICD-10-CM | POA: Diagnosis not present

## 2020-10-31 DIAGNOSIS — Z96641 Presence of right artificial hip joint: Secondary | ICD-10-CM | POA: Diagnosis not present

## 2020-10-31 DIAGNOSIS — R531 Weakness: Secondary | ICD-10-CM | POA: Diagnosis not present

## 2020-10-31 DIAGNOSIS — M25651 Stiffness of right hip, not elsewhere classified: Secondary | ICD-10-CM | POA: Diagnosis not present

## 2020-11-02 ENCOUNTER — Ambulatory Visit (INDEPENDENT_AMBULATORY_CARE_PROVIDER_SITE_OTHER): Payer: Medicare Other

## 2020-11-02 ENCOUNTER — Other Ambulatory Visit: Payer: Self-pay

## 2020-11-02 DIAGNOSIS — J4550 Severe persistent asthma, uncomplicated: Secondary | ICD-10-CM

## 2020-11-02 DIAGNOSIS — R531 Weakness: Secondary | ICD-10-CM | POA: Diagnosis not present

## 2020-11-02 DIAGNOSIS — M25651 Stiffness of right hip, not elsewhere classified: Secondary | ICD-10-CM | POA: Diagnosis not present

## 2020-11-02 DIAGNOSIS — Z96641 Presence of right artificial hip joint: Secondary | ICD-10-CM | POA: Diagnosis not present

## 2020-11-02 DIAGNOSIS — R262 Difficulty in walking, not elsewhere classified: Secondary | ICD-10-CM | POA: Diagnosis not present

## 2020-11-02 MED ORDER — OMALIZUMAB 150 MG/ML ~~LOC~~ SOSY
300.0000 mg | PREFILLED_SYRINGE | Freq: Once | SUBCUTANEOUS | Status: AC
Start: 2020-11-02 — End: 2020-11-02
  Administered 2020-11-02: 300 mg via SUBCUTANEOUS

## 2020-11-02 NOTE — Progress Notes (Signed)
Have you been hospitalized within the last 10 days?  No Do you have a fever?  No Do you have a cough?  No Do you have a headache or sore throat? No  

## 2020-11-07 DIAGNOSIS — R262 Difficulty in walking, not elsewhere classified: Secondary | ICD-10-CM | POA: Diagnosis not present

## 2020-11-07 DIAGNOSIS — M25651 Stiffness of right hip, not elsewhere classified: Secondary | ICD-10-CM | POA: Diagnosis not present

## 2020-11-07 DIAGNOSIS — Z96641 Presence of right artificial hip joint: Secondary | ICD-10-CM | POA: Diagnosis not present

## 2020-11-07 DIAGNOSIS — R531 Weakness: Secondary | ICD-10-CM | POA: Diagnosis not present

## 2020-11-09 DIAGNOSIS — M25651 Stiffness of right hip, not elsewhere classified: Secondary | ICD-10-CM | POA: Diagnosis not present

## 2020-11-09 DIAGNOSIS — R262 Difficulty in walking, not elsewhere classified: Secondary | ICD-10-CM | POA: Diagnosis not present

## 2020-11-09 DIAGNOSIS — R531 Weakness: Secondary | ICD-10-CM | POA: Diagnosis not present

## 2020-11-09 DIAGNOSIS — Z96641 Presence of right artificial hip joint: Secondary | ICD-10-CM | POA: Diagnosis not present

## 2020-11-13 DIAGNOSIS — Z96641 Presence of right artificial hip joint: Secondary | ICD-10-CM | POA: Diagnosis not present

## 2020-11-13 DIAGNOSIS — R531 Weakness: Secondary | ICD-10-CM | POA: Diagnosis not present

## 2020-11-13 DIAGNOSIS — R262 Difficulty in walking, not elsewhere classified: Secondary | ICD-10-CM | POA: Diagnosis not present

## 2020-11-13 DIAGNOSIS — M25651 Stiffness of right hip, not elsewhere classified: Secondary | ICD-10-CM | POA: Diagnosis not present

## 2020-11-17 ENCOUNTER — Encounter: Payer: Self-pay | Admitting: *Deleted

## 2020-11-20 ENCOUNTER — Other Ambulatory Visit (HOSPITAL_COMMUNITY): Payer: Self-pay | Admitting: Family Medicine

## 2020-11-20 ENCOUNTER — Ambulatory Visit (HOSPITAL_COMMUNITY)
Admission: RE | Admit: 2020-11-20 | Discharge: 2020-11-20 | Disposition: A | Discharge status: Home / Self Care | Payer: Medicare Other | Source: Ambulatory Visit | Attending: Family Medicine | Admitting: Family Medicine

## 2020-11-20 ENCOUNTER — Other Ambulatory Visit: Payer: Self-pay

## 2020-11-20 DIAGNOSIS — M79661 Pain in right lower leg: Secondary | ICD-10-CM

## 2020-11-20 DIAGNOSIS — Z96641 Presence of right artificial hip joint: Secondary | ICD-10-CM | POA: Diagnosis not present

## 2020-11-20 DIAGNOSIS — M25651 Stiffness of right hip, not elsewhere classified: Secondary | ICD-10-CM | POA: Diagnosis not present

## 2020-11-20 DIAGNOSIS — M7989 Other specified soft tissue disorders: Secondary | ICD-10-CM | POA: Diagnosis not present

## 2020-11-20 DIAGNOSIS — R262 Difficulty in walking, not elsewhere classified: Secondary | ICD-10-CM | POA: Diagnosis not present

## 2020-11-20 DIAGNOSIS — R531 Weakness: Secondary | ICD-10-CM | POA: Diagnosis not present

## 2020-11-21 DIAGNOSIS — M65322 Trigger finger, left index finger: Secondary | ICD-10-CM | POA: Diagnosis not present

## 2020-11-21 DIAGNOSIS — R200 Anesthesia of skin: Secondary | ICD-10-CM | POA: Diagnosis not present

## 2020-11-21 DIAGNOSIS — M65312 Trigger thumb, left thumb: Secondary | ICD-10-CM | POA: Diagnosis not present

## 2020-11-22 DIAGNOSIS — M25651 Stiffness of right hip, not elsewhere classified: Secondary | ICD-10-CM | POA: Diagnosis not present

## 2020-11-22 DIAGNOSIS — R531 Weakness: Secondary | ICD-10-CM | POA: Diagnosis not present

## 2020-11-22 DIAGNOSIS — Z96641 Presence of right artificial hip joint: Secondary | ICD-10-CM | POA: Diagnosis not present

## 2020-11-22 DIAGNOSIS — M25571 Pain in right ankle and joints of right foot: Secondary | ICD-10-CM | POA: Diagnosis not present

## 2020-11-22 DIAGNOSIS — R262 Difficulty in walking, not elsewhere classified: Secondary | ICD-10-CM | POA: Diagnosis not present

## 2020-11-23 DIAGNOSIS — M4316 Spondylolisthesis, lumbar region: Secondary | ICD-10-CM | POA: Diagnosis not present

## 2020-11-23 DIAGNOSIS — M5187 Other intervertebral disc disorders, lumbosacral region: Secondary | ICD-10-CM | POA: Diagnosis not present

## 2020-11-24 ENCOUNTER — Other Ambulatory Visit: Payer: Self-pay | Admitting: Orthopedic Surgery

## 2020-11-24 DIAGNOSIS — M545 Low back pain, unspecified: Secondary | ICD-10-CM

## 2020-12-01 ENCOUNTER — Ambulatory Visit (INDEPENDENT_AMBULATORY_CARE_PROVIDER_SITE_OTHER): Payer: Medicare Other

## 2020-12-01 ENCOUNTER — Other Ambulatory Visit: Payer: Self-pay

## 2020-12-01 ENCOUNTER — Ambulatory Visit: Payer: Medicare Other

## 2020-12-01 VITALS — BP 145/78 | HR 36 | Temp 98.0°F | Resp 16

## 2020-12-01 DIAGNOSIS — J4550 Severe persistent asthma, uncomplicated: Secondary | ICD-10-CM

## 2020-12-01 MED ORDER — FAMOTIDINE IN NACL 20-0.9 MG/50ML-% IV SOLN: 20.0000 mg | Freq: Once | INTRAVENOUS | Status: DC | PRN

## 2020-12-01 MED ORDER — METHYLPREDNISOLONE SODIUM SUCC 125 MG IJ SOLR: 125.0000 mg | Freq: Once | INTRAMUSCULAR | Status: DC | PRN

## 2020-12-01 MED ORDER — ALBUTEROL SULFATE HFA 108 (90 BASE) MCG/ACT IN AERS: 2.0000 | INHALATION_SPRAY | Freq: Once | RESPIRATORY_TRACT | Status: DC | PRN

## 2020-12-01 MED ORDER — SODIUM CHLORIDE 0.9 % IV SOLN: Freq: Once | INTRAVENOUS | Status: DC | PRN

## 2020-12-01 MED ORDER — OMALIZUMAB 150 MG/ML ~~LOC~~ SOSY
300.0000 mg | PREFILLED_SYRINGE | Freq: Once | SUBCUTANEOUS | Status: AC
  Administered 2020-12-01: 300 mg via SUBCUTANEOUS
  Filled 2020-12-01 (×2): qty 2

## 2020-12-01 MED ORDER — DIPHENHYDRAMINE HCL 50 MG/ML IJ SOLN: 50.0000 mg | Freq: Once | INTRAMUSCULAR | Status: DC | PRN

## 2020-12-01 MED ORDER — EPINEPHRINE 0.3 MG/0.3ML IJ SOAJ: 0.3000 mg | Freq: Once | INTRAMUSCULAR | Status: DC | PRN

## 2020-12-01 NOTE — Progress Notes (Signed)
Diagnosis: Asthma  Provider:  Marshell Garfinkel, MD  Procedure: Injection  Xolair (Omalizumab), ,300 mg Site: subcutaneous 150mg   x1 left arm, 150mg  x1 right arm  Discharge: Condition: Good, Destination: Home . AVS declined by patient.   Performed by:  Koren Shiver, RN

## 2020-12-13 ENCOUNTER — Ambulatory Visit
Admission: RE | Admit: 2020-12-13 | Discharge: 2020-12-13 | Disposition: A | Discharge status: Home / Self Care | Payer: Medicare Other | Source: Ambulatory Visit | Attending: Orthopedic Surgery | Admitting: Orthopedic Surgery

## 2020-12-13 DIAGNOSIS — M545 Low back pain, unspecified: Secondary | ICD-10-CM | POA: Diagnosis not present

## 2020-12-13 DIAGNOSIS — M48061 Spinal stenosis, lumbar region without neurogenic claudication: Secondary | ICD-10-CM | POA: Diagnosis not present

## 2020-12-26 DIAGNOSIS — M4316 Spondylolisthesis, lumbar region: Secondary | ICD-10-CM | POA: Diagnosis not present

## 2020-12-29 ENCOUNTER — Other Ambulatory Visit: Payer: Self-pay

## 2020-12-29 ENCOUNTER — Ambulatory Visit (INDEPENDENT_AMBULATORY_CARE_PROVIDER_SITE_OTHER): Payer: Medicare Other

## 2020-12-29 VITALS — BP 161/79 | HR 67 | Temp 97.9°F

## 2020-12-29 DIAGNOSIS — J4550 Severe persistent asthma, uncomplicated: Secondary | ICD-10-CM | POA: Diagnosis not present

## 2020-12-29 MED ORDER — EPINEPHRINE 0.3 MG/0.3ML IJ SOAJ: 0.3000 mg | Freq: Once | INTRAMUSCULAR | Status: DC | PRN

## 2020-12-29 MED ORDER — OMALIZUMAB 150 MG/ML ~~LOC~~ SOSY
300.0000 mg | PREFILLED_SYRINGE | Freq: Once | SUBCUTANEOUS | Status: AC
  Administered 2020-12-29: 300 mg via SUBCUTANEOUS

## 2020-12-29 MED ORDER — ALBUTEROL SULFATE HFA 108 (90 BASE) MCG/ACT IN AERS: 2.0000 | INHALATION_SPRAY | Freq: Once | RESPIRATORY_TRACT | Status: DC | PRN

## 2020-12-29 MED ORDER — DIPHENHYDRAMINE HCL 50 MG/ML IJ SOLN: 50.0000 mg | Freq: Once | INTRAMUSCULAR | Status: DC | PRN

## 2020-12-29 MED ORDER — FAMOTIDINE IN NACL 20-0.9 MG/50ML-% IV SOLN: 20.0000 mg | Freq: Once | INTRAVENOUS | Status: DC | PRN

## 2020-12-29 MED ORDER — SODIUM CHLORIDE 0.9 % IV SOLN: Freq: Once | INTRAVENOUS | Status: DC | PRN

## 2020-12-29 MED ORDER — METHYLPREDNISOLONE SODIUM SUCC 125 MG IJ SOLR: 125.0000 mg | Freq: Once | INTRAMUSCULAR | Status: DC | PRN

## 2020-12-29 NOTE — Progress Notes (Signed)
Diagnosis: Asthma  Provider:  Praveen Mannam, MD  Procedure: Injection  Xolair (Omalizumab), Dose: 300 mg, Site: subcutaneous  Discharge: Condition: Good, Destination: Home . AVS provided to patient.   Performed by:  Wolfe Camarena, RN        

## 2021-01-02 ENCOUNTER — Ambulatory Visit: Payer: Medicare Other | Admitting: Emergency Medicine

## 2021-01-09 ENCOUNTER — Encounter: Payer: Self-pay | Admitting: Pulmonary Disease

## 2021-01-09 ENCOUNTER — Other Ambulatory Visit: Payer: Self-pay

## 2021-01-09 ENCOUNTER — Ambulatory Visit (INDEPENDENT_AMBULATORY_CARE_PROVIDER_SITE_OTHER): Payer: Medicare Other | Admitting: Pulmonary Disease

## 2021-01-09 VITALS — BP 114/72 | HR 44 | Temp 97.5°F | Ht 69.0 in | Wt 164.4 lb

## 2021-01-09 DIAGNOSIS — J4550 Severe persistent asthma, uncomplicated: Secondary | ICD-10-CM | POA: Diagnosis not present

## 2021-01-09 NOTE — Patient Instructions (Addendum)
Continue Xolair injections monthly  Continue trelegy inhaler as needed

## 2021-01-09 NOTE — Progress Notes (Signed)
Synopsis: Referred in January 2021 for Asthma. Former patient of Dr. Carlis Abbott.  Subjective:   PATIENT ID: David [name] GENDER: male DOB: [birth year], MRN: [id number]   HPI  Chief Complaint  Patient presents with  . Establish Care    Former PC patient for asthma. States his asthma has been stable since last visit. Still using Xolair (receives injections here in office).    David [name] is a 75 year old male, never smoker with severe persistent asthma who returns to pulmonary clinic for follow up.   He started omalizumab 11/29/19 with significant improvement in his symptoms and has stop using his inhaler on a daily basis. He is using Trelegy ellipta as needed, maybe a couple times per month.   He had right hip surgery January 2022 and is still having some discomfort there.   Past Medical History:  Diagnosis Date  . Altitude sickness   . Asthma    exercise-induced; prn inhaler  . AV block, 2nd degree   . Bilateral inguinal hernia (BIH) 10/16/2012   Overview:  [facility] -Dr. Brantley Stage.  Observation for now 10/2012  . Complete heart block ([place]) 04/04/2018  . COVID-19   . DVT (deep venous thrombosis) ([place]) 08/13/2018   Right peroneal DVT 08/13/18, post right TKA 08/10/18  . Insomnia 07/14/2014  . Mobitz type 1 second degree atrioventricular block   . Nasal polyps   . OA (osteoarthritis) 09/24/2012  . 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 Reactive airway disease 09/24/2012  . Rotator cuff tear, right    impingement, AC arthrosis     Family History  Problem Relation Age of Onset  . Heart Problems Mother        had pacemaker, lived to 69  . Alzheimer's disease Father   . Prostate cancer Father      Social History   Socioeconomic History  . Marital status: Married    Spouse name: Not on file  . Number of children: Not on file  . Years of education: Not on file  . Highest education level: Not on file  Occupational History  . Not on file  Tobacco Use  . Smoking  status: Never Smoker  . Smokeless tobacco: Never Used  Vaping Use  . Vaping Use: Never used  Substance and Sexual Activity  . Alcohol use: Yes    Comment: occ. beer  . Drug use: No  . Sexual activity: Not on file  Other Topics Concern  . Not on file  Social History Narrative  . Not on file   Social Determinants of Health   Financial Resource Strain: Not on file  Food Insecurity: Not on file  Transportation Needs: Not on file  Physical Activity: Not on file  Stress: Not on file  Social Connections: Not on file  Intimate Partner Violence: Not on file     Allergies  Allergen Reactions  . Codeine Itching     Outpatient Medications Prior to Visit  Medication Sig Dispense Refill  . albuterol (VENTOLIN HFA) 108 (90 Base) MCG/ACT inhaler Inhale 2 puffs into the lungs every 4 (four) hours as needed for wheezing or shortness of breath. 8 g 1  . Fluticasone-Umeclidin-Vilant (TRELEGY ELLIPTA) 200-62.5-25 MCG/INH AEPB Inhale 1 puff into the lungs daily. (Patient taking differently: Inhale 1 puff into the lungs daily as needed (asthma).) 1 each 11  . omalizumab (XOLAIR) 75 MG/0.5ML prefilled syringe Inject 75 mg into the skin every 28 (twenty-eight) days.     No facility-administered  medications prior to visit.    Review of Systems  Constitutional: Negative for chills, fever, malaise/fatigue and weight loss.  HENT: Negative for congestion, sinus pain and sore throat.   Eyes: Negative.   Respiratory: Negative for cough, hemoptysis, sputum production, shortness of breath and wheezing.   Cardiovascular: Negative for chest pain, palpitations, orthopnea, claudication and leg swelling.  Gastrointestinal: Negative for abdominal pain, heartburn, nausea and vomiting.  Genitourinary: Negative.   Musculoskeletal: Negative for joint pain and myalgias.  Skin: Negative for rash.  Neurological: Negative for weakness.  Endo/Heme/Allergies: Negative.   Psychiatric/Behavioral: Negative.        Objective:   Vitals:   01/09/21 1141  BP: 114/72  Pulse: (!) 44  Temp: (!) 97.5 F (36.4 C)  TempSrc: Temporal  SpO2: 97%  Weight: 164 lb 6.4 oz (74.6 kg)  Height: 5\' 9"  (1.753 m)     Physical Exam Constitutional:      General: He is not in acute distress. HENT:     Head: Normocephalic and atraumatic.  Eyes:     Extraocular Movements: Extraocular movements intact.     Conjunctiva/sclera: Conjunctivae normal.     Pupils: Pupils are equal, round, and reactive to light.  Cardiovascular:     Rate and Rhythm: Normal rate and regular rhythm.     Pulses: Normal pulses.     Heart sounds: Normal heart sounds. No murmur heard.   Pulmonary:     Effort: Pulmonary effort is normal.     Breath sounds: Normal breath sounds. No wheezing, rhonchi or rales.  Abdominal:     General: Bowel sounds are normal.     Palpations: Abdomen is soft.  Musculoskeletal:     Right lower leg: No edema.     Left lower leg: No edema.  Lymphadenopathy:     Cervical: No cervical adenopathy.  Skin:    General: Skin is warm and dry.  Neurological:     General: No focal deficit present.     Mental Status: He is alert.  Psychiatric:        Mood and Affect: Mood normal.        Behavior: Behavior normal.        Thought Content: Thought content normal.        Judgment: Judgment normal.       CBC    Component Value Date/Time   WBC 6.8 09/21/2020 0951   RBC 4.81 09/21/2020 0951   HGB 15.5 09/21/2020 0951   HGB 14.6 11/06/2018 1257   HCT 46.9 09/21/2020 0951   HCT 45.9 11/06/2018 1257   PLT 203 09/21/2020 0951   PLT 201 11/06/2018 1257   MCV 97.5 09/21/2020 0951   MCV 95 11/06/2018 1257   MCH 32.2 09/21/2020 0951   MCHC 33.0 09/21/2020 0951   RDW 14.3 09/21/2020 0951   RDW 13.0 11/06/2018 1257   LYMPHSABS 1.7 09/21/2020 0951   MONOABS 0.7 09/21/2020 0951   EOSABS 0.5 09/21/2020 0951   BASOSABS 0.1 09/21/2020 0951   BMP Latest Ref Rng & Units 09/21/2020 05/02/2020 02/24/2019   Glucose 70 - 99 mg/dL 87 95 91  BUN 8 - 23 mg/dL 27(H) 30(H) 33(H)  Creatinine 0.61 - 1.24 mg/dL 1.11 1.13 1.37(H)  BUN/Creat Ratio 10 - 24 - - -  Sodium 135 - 145 mmol/L 141 139 140  Potassium 3.5 - 5.1 mmol/L 4.7 4.6 4.6  Chloride 98 - 111 mmol/L 106 101 106  CO2 22 - 32 mmol/L 25 28 24   Calcium  8.9 - 10.3 mg/dL 9.5 9.5 9.1     Chest imaging: CXR 09/2020 No opacities or infiltrates. Left basilar atelectasis.   CTA chest 8/4/201  Mild bibasilar dependent opacities.  No significant mediastinal or hilar adenopathy.  No PE.  No bronchiectasis or concern for chronic pulmonary infection.  PFT: PFT Results Latest Ref Rng & Units 10/18/2019  FVC-Pre L 3.65  FVC-Predicted Pre % 91  FVC-Post L 3.73  FVC-Predicted Post % 93  Pre FEV1/FVC % % 62  Post FEV1/FCV % % 66  FEV1-Pre L 2.27  FEV1-Predicted Pre % 78  FEV1-Post L 2.46  DLCO uncorrected ml/min/mmHg 26.60  DLCO UNC% % 111  DLVA Predicted % 101  TLC L 6.85  TLC % Predicted % 104  RV % Predicted % 128  2021-moderate obstruction without significant bronchodilator reversibility air trapping without hyperinflation.  Normal diffusion.  Flow volume loop suggests obstruction.  Labs: IgE 297  Echocardiogram 04/05/2018: LVEF 55 to 60%, no regional wall motion abnormalities.  Normal LA, mildly dilated RV, normal RA.  Normal PASP.  Trivial TR, otherwise normal valves.  Heart Catheterization 2019: EF 55 to 65%, normal LV function and normal LVEDP.  No aortic stenosis, no significant coronary artery disease.    Assessment & Plan:   Severe persistent asthma without complication  Discussion: David [name] is a 75 year old male, never smoker with severe persistent asthma who returns to pulmonary clinic for follow up.   He is to continue Omalizumab injections monthly. He can continue to use Trelegy ellipta as needed. We discussed the importance of continuing to use a maintenance inhaler along with the omalizumab as this is how the  medications were studied in the clinical trials. He understands, but prefers to use the inhaler on an as needed basis as he does not like to take a lot of medications.   Follow up in 6 months.   Freda Jackson, MD [facility] Office: [phone]     Current Outpatient Medications:  .  albuterol (VENTOLIN HFA) 108 (90 Base) MCG/ACT inhaler, Inhale 2 puffs into the lungs every 4 (four) hours as needed for wheezing or shortness of breath., Disp: 8 g, Rfl: 1 .  Fluticasone-Umeclidin-Vilant (TRELEGY ELLIPTA) 200-62.5-25 MCG/INH AEPB, Inhale 1 puff into the lungs daily. (Patient taking differently: Inhale 1 puff into the lungs daily as needed (asthma).), Disp: 1 each, Rfl: 11 .  omalizumab (XOLAIR) 75 MG/0.5ML prefilled syringe, Inject 75 mg into the skin every 28 (twenty-eight) days., Disp: , Rfl:

## 2021-01-23 ENCOUNTER — Ambulatory Visit: Payer: Medicare Other

## 2021-01-23 ENCOUNTER — Ambulatory Visit (INDEPENDENT_AMBULATORY_CARE_PROVIDER_SITE_OTHER): Payer: Medicare Other

## 2021-01-23 ENCOUNTER — Other Ambulatory Visit: Payer: Self-pay

## 2021-01-23 VITALS — BP 145/87 | HR 45 | Temp 99.0°F | Resp 18

## 2021-01-23 DIAGNOSIS — J4550 Severe persistent asthma, uncomplicated: Secondary | ICD-10-CM

## 2021-01-23 MED ORDER — OMALIZUMAB 150 MG/ML ~~LOC~~ SOSY
300.0000 mg | PREFILLED_SYRINGE | Freq: Once | SUBCUTANEOUS | Status: AC
  Administered 2021-01-23: 300 mg via SUBCUTANEOUS
  Filled 2021-01-23: qty 2

## 2021-01-23 MED ORDER — EPINEPHRINE 0.3 MG/0.3ML IJ SOAJ: 0.3000 mg | Freq: Once | INTRAMUSCULAR | Status: DC | PRN

## 2021-01-23 MED ORDER — FAMOTIDINE IN NACL 20-0.9 MG/50ML-% IV SOLN: 20.0000 mg | Freq: Once | INTRAVENOUS | Status: DC | PRN

## 2021-01-23 MED ORDER — DIPHENHYDRAMINE HCL 50 MG/ML IJ SOLN: 50.0000 mg | Freq: Once | INTRAMUSCULAR | Status: DC | PRN

## 2021-01-23 MED ORDER — ALBUTEROL SULFATE HFA 108 (90 BASE) MCG/ACT IN AERS: 2.0000 | INHALATION_SPRAY | Freq: Once | RESPIRATORY_TRACT | Status: DC | PRN

## 2021-01-23 MED ORDER — SODIUM CHLORIDE 0.9 % IV SOLN: Freq: Once | INTRAVENOUS | Status: DC | PRN

## 2021-01-23 MED ORDER — METHYLPREDNISOLONE SODIUM SUCC 125 MG IJ SOLR: 125.0000 mg | Freq: Once | INTRAMUSCULAR | Status: DC | PRN

## 2021-01-23 NOTE — Progress Notes (Signed)
Diagnosis: Asthma  Provider:  Praveen Mannam, MD  Procedure: Injection  Xolair (Omalizumab), Dose: 300 mg, Site: subcutaneous  Discharge: Condition: Good, Destination: Home . AVS provided to patient.   Performed by:  Floride Hutmacher, RN        

## 2021-01-26 ENCOUNTER — Ambulatory Visit: Payer: Medicare Other

## 2021-02-22 DIAGNOSIS — R200 Anesthesia of skin: Secondary | ICD-10-CM | POA: Diagnosis not present

## 2021-02-22 DIAGNOSIS — M720 Palmar fascial fibromatosis [Dupuytren]: Secondary | ICD-10-CM | POA: Diagnosis not present

## 2021-02-22 DIAGNOSIS — M65312 Trigger thumb, left thumb: Secondary | ICD-10-CM | POA: Diagnosis not present

## 2021-02-23 ENCOUNTER — Ambulatory Visit (INDEPENDENT_AMBULATORY_CARE_PROVIDER_SITE_OTHER): Payer: Medicare Other

## 2021-02-23 ENCOUNTER — Other Ambulatory Visit: Payer: Self-pay

## 2021-02-23 VITALS — BP 112/69 | HR 41 | Temp 97.6°F

## 2021-02-23 DIAGNOSIS — J4550 Severe persistent asthma, uncomplicated: Secondary | ICD-10-CM

## 2021-02-23 MED ORDER — ALBUTEROL SULFATE HFA 108 (90 BASE) MCG/ACT IN AERS: 2.0000 | INHALATION_SPRAY | Freq: Once | RESPIRATORY_TRACT | Status: DC | PRN

## 2021-02-23 MED ORDER — DIPHENHYDRAMINE HCL 50 MG/ML IJ SOLN: 50.0000 mg | Freq: Once | INTRAMUSCULAR | Status: DC | PRN

## 2021-02-23 MED ORDER — OMALIZUMAB 150 MG/ML ~~LOC~~ SOSY
300.0000 mg | PREFILLED_SYRINGE | Freq: Once | SUBCUTANEOUS | Status: AC
  Administered 2021-02-23: 300 mg via SUBCUTANEOUS

## 2021-02-23 MED ORDER — EPINEPHRINE 0.3 MG/0.3ML IJ SOAJ: 0.3000 mg | Freq: Once | INTRAMUSCULAR | Status: DC | PRN

## 2021-02-23 MED ORDER — SODIUM CHLORIDE 0.9 % IV SOLN: Freq: Once | INTRAVENOUS | Status: DC | PRN

## 2021-02-23 MED ORDER — FAMOTIDINE IN NACL 20-0.9 MG/50ML-% IV SOLN: 20.0000 mg | Freq: Once | INTRAVENOUS | Status: DC | PRN

## 2021-02-23 MED ORDER — METHYLPREDNISOLONE SODIUM SUCC 125 MG IJ SOLR: 125.0000 mg | Freq: Once | INTRAMUSCULAR | Status: DC | PRN

## 2021-02-23 NOTE — Progress Notes (Addendum)
Diagnosis: Asthma  Provider:  Praveen Mannam, MD  Procedure: Injection  Xolair (Omalizumab), Dose: 300 mg, Site: subcutaneous  Discharge: Condition: Good, Destination: Home . AVS provided to patient.   Performed by:  Delando Satter, RN        

## 2021-03-07 DIAGNOSIS — G5601 Carpal tunnel syndrome, right upper limb: Secondary | ICD-10-CM | POA: Diagnosis not present

## 2021-03-07 DIAGNOSIS — G5602 Carpal tunnel syndrome, left upper limb: Secondary | ICD-10-CM | POA: Diagnosis not present

## 2021-03-11 DIAGNOSIS — Z20822 Contact with and (suspected) exposure to covid-19: Secondary | ICD-10-CM | POA: Diagnosis not present

## 2021-03-15 DIAGNOSIS — G5601 Carpal tunnel syndrome, right upper limb: Secondary | ICD-10-CM | POA: Diagnosis not present

## 2021-03-15 DIAGNOSIS — M65312 Trigger thumb, left thumb: Secondary | ICD-10-CM | POA: Diagnosis not present

## 2021-03-15 DIAGNOSIS — G5602 Carpal tunnel syndrome, left upper limb: Secondary | ICD-10-CM | POA: Diagnosis not present

## 2021-03-16 ENCOUNTER — Telehealth: Payer: Self-pay | Admitting: Cardiology

## 2021-03-16 NOTE — Telephone Encounter (Signed)
    Patient Name: David [name]  DOB: 1945/12/16 MRN: [id number]  Primary Cardiologist: Constance Haw, MD  Chart reviewed as part of pre-operative protocol coverage.   Spoke with Dr. Curt Bears over the phone who recommends avoiding general anesthesia and keeping dopamine on hand for his upcoming surgery, otherwise no further recommendations at this time.  I will route this recommendation to the requesting party via Epic fax function and remove from pre-op pool.  Please call with questions.  Abigail Butts, PA-C 03/16/2021, 3:19 PM

## 2021-03-16 NOTE — Telephone Encounter (Signed)
   Name: David [name]  DOB: Jan 19, 1946  MRN: [id number]   Primary Cardiologist: Constance Haw, MD  Chart reviewed as part of pre-operative protocol coverage. Patient was contacted 03/16/2021 in reference to pre-operative risk assessment for pending surgery as outlined below.  David [name] was last seen on 08/31/20 by Dr. Curt Bears. He was noted to be in CHB at that visit and underwent a ETT which showed one-to-one conduction at faster heart rates and was therefore cleared for hip surgery at intermediate risk  Since that day, David [name] has done well from a cardiac standpoint. He exercises 3 days a week at the gym, doing cardio activity for 45 minutes. He can easily complete 4 METs without anginal complaints. He denies symptoms of dizziness, lightheadedness, or syncope. He is anxious to get his wrist surgery ASAP so as not to delay a trip planned to [place] next month.   Based on ACC/AHA guidelines, the patient would be at acceptable risk for the planned procedure without further cardiovascular testin, however, given history of CHB, will route to Dr. Curt Bears for input.   Dr. Curt Bears, can you weigh in on his preop risk? Not describing any concerning symptoms. Procedure is planned with MAC at the surgery center. Please route your response back to P CV DIV PREOP. Thanks!   Abigail Butts, PA-C 03/16/2021, 8:49 AM

## 2021-03-16 NOTE — Telephone Encounter (Signed)
   [facility] Pre-operative Risk Assessment    Patient Name: David [name]  DOB: 11/25/1945 MRN: [id number]  HEARTCARE STAFF:  - IMPORTANT!!!!!! Under Visit Info/Reason for Call, type in Other and utilize the format Clearance MM/DD/YY or Clearance TBD. Do not use dashes or single digits. - Please review there is not already an duplicate clearance open for this procedure. - If request is for dental extraction, please clarify the # of teeth to be extracted. - If the patient is currently at the dentist's office, call Pre-Op Callback Staff (MA/nurse) to input urgent request.  - If the patient is not currently in the dentist office, please route to the Pre-Op pool.  Request for surgical clearance:  What type of surgery is being performed? Right Endoscopy Corpal Tunnel Release  When is this surgery scheduled? 03-19-21  What type of clearance is required (medical clearance vs. Pharmacy clearance to hold med vs. Both)? [place]  Are there any medications that need to be held prior to surgery and how long? ?  Practice name and name of physician performing surgery? Dr David [name]  What is the office phone number? [phone]   7.   What is the office fax number? [phone]  8.   Anesthesia type (None, local, MAC, general) ? Mac   David [name] 03/16/2021, 8:24 AM  _________________________________________________________________   (provider comments below)

## 2021-03-19 DIAGNOSIS — G5601 Carpal tunnel syndrome, right upper limb: Secondary | ICD-10-CM | POA: Diagnosis not present

## 2021-03-23 ENCOUNTER — Ambulatory Visit (INDEPENDENT_AMBULATORY_CARE_PROVIDER_SITE_OTHER): Payer: Medicare Other

## 2021-03-23 ENCOUNTER — Other Ambulatory Visit: Payer: Self-pay

## 2021-03-23 VITALS — BP 158/79 | HR 44 | Temp 97.5°F | Resp 17

## 2021-03-23 DIAGNOSIS — J4550 Severe persistent asthma, uncomplicated: Secondary | ICD-10-CM

## 2021-03-23 MED ORDER — METHYLPREDNISOLONE SODIUM SUCC 125 MG IJ SOLR: 125.0000 mg | Freq: Once | INTRAMUSCULAR | Status: DC | PRN

## 2021-03-23 MED ORDER — FAMOTIDINE IN NACL 20-0.9 MG/50ML-% IV SOLN: 20.0000 mg | Freq: Once | INTRAVENOUS | Status: DC | PRN

## 2021-03-23 MED ORDER — SODIUM CHLORIDE 0.9 % IV SOLN: Freq: Once | INTRAVENOUS | Status: DC | PRN

## 2021-03-23 MED ORDER — DIPHENHYDRAMINE HCL 50 MG/ML IJ SOLN: 50.0000 mg | Freq: Once | INTRAMUSCULAR | Status: DC | PRN

## 2021-03-23 MED ORDER — EPINEPHRINE 0.3 MG/0.3ML IJ SOAJ: 0.3000 mg | Freq: Once | INTRAMUSCULAR | Status: DC | PRN

## 2021-03-23 MED ORDER — OMALIZUMAB 150 MG/ML ~~LOC~~ SOSY
300.0000 mg | PREFILLED_SYRINGE | Freq: Once | SUBCUTANEOUS | Status: AC
  Administered 2021-03-23: 300 mg via SUBCUTANEOUS
  Filled 2021-03-23: qty 2

## 2021-03-23 MED ORDER — ALBUTEROL SULFATE HFA 108 (90 BASE) MCG/ACT IN AERS: 2.0000 | INHALATION_SPRAY | Freq: Once | RESPIRATORY_TRACT | Status: DC | PRN

## 2021-03-23 NOTE — Progress Notes (Signed)
Diagnosis: Asthma  Provider:  Marshell Garfinkel, MD  Procedure: Injection  Xolair (Omalizumab), Dose: 300 mg, Site: subcutaneous  Discharge: Condition: Stable, Destination: Home . AVS provided to patient.   Performed by:  Philopater Mucha, Sherlon Handing, LPN

## 2021-04-19 ENCOUNTER — Other Ambulatory Visit: Payer: Self-pay

## 2021-04-19 ENCOUNTER — Ambulatory Visit (INDEPENDENT_AMBULATORY_CARE_PROVIDER_SITE_OTHER): Payer: Medicare Other

## 2021-04-19 ENCOUNTER — Telehealth: Payer: Self-pay | Admitting: Cardiology

## 2021-04-19 VITALS — BP 165/83 | HR 38 | Temp 97.6°F | Resp 18

## 2021-04-19 DIAGNOSIS — J4550 Severe persistent asthma, uncomplicated: Secondary | ICD-10-CM

## 2021-04-19 MED ORDER — FAMOTIDINE IN NACL 20-0.9 MG/50ML-% IV SOLN: 20.0000 mg | Freq: Once | INTRAVENOUS | Status: DC | PRN

## 2021-04-19 MED ORDER — SODIUM CHLORIDE 0.9 % IV SOLN: Freq: Once | INTRAVENOUS | Status: DC | PRN

## 2021-04-19 MED ORDER — METHYLPREDNISOLONE SODIUM SUCC 125 MG IJ SOLR: 125.0000 mg | Freq: Once | INTRAMUSCULAR | Status: DC | PRN

## 2021-04-19 MED ORDER — EPINEPHRINE 0.3 MG/0.3ML IJ SOAJ: 0.3000 mg | Freq: Once | INTRAMUSCULAR | Status: DC | PRN

## 2021-04-19 MED ORDER — DIPHENHYDRAMINE HCL 50 MG/ML IJ SOLN: 50.0000 mg | Freq: Once | INTRAMUSCULAR | Status: DC | PRN

## 2021-04-19 MED ORDER — ALBUTEROL SULFATE HFA 108 (90 BASE) MCG/ACT IN AERS: 2.0000 | INHALATION_SPRAY | Freq: Once | RESPIRATORY_TRACT | Status: DC | PRN

## 2021-04-19 MED ORDER — OMALIZUMAB 150 MG/ML ~~LOC~~ SOSY
300.0000 mg | PREFILLED_SYRINGE | Freq: Once | SUBCUTANEOUS | Status: AC
  Administered 2021-04-19: 300 mg via SUBCUTANEOUS
  Filled 2021-04-19: qty 2

## 2021-04-19 NOTE — Progress Notes (Signed)
Diagnosis: Asthma  Provider:  Marshell Garfinkel, MD  Procedure: Injection  Xolair (Omalizumab), Dose: 300 mg, Site: subcutaneous 1 x left, 1 x right   Discharge: Condition: Good, Destination: Home . AVS provided to patient.   Performed by:  Ellina Sivertsen, Sherlon Handing, LPN

## 2021-04-19 NOTE — Telephone Encounter (Signed)
   [facility] Pre-operative Risk Assessment    Patient Name: LYNDEN FLEMMER  DOB: [birth year] MRN: [id number]  HEARTCARE STAFF:  - IMPORTANT!!!!!! Under Visit Info/Reason for Call, type in Other and utilize the format Clearance MM/DD/YY or Clearance TBD. Do not use dashes or single digits. - Please review there is not already an duplicate clearance open for this procedure. - If request is for dental extraction, please clarify the # of teeth to be extracted. - If the patient is currently at the dentist's office, call Pre-Op Callback Staff (MA/nurse) to input urgent request.  - If the patient is not currently in the dentist office, please route to the Pre-Op pool.  Request for surgical clearance:  What type of surgery is being performed? Left endoscopic carpal tunnel release, thumb and index trigger releases, and dupuytren's excision  When is this surgery scheduled? 04/27/21  What type of clearance is required (medical clearance vs. Pharmacy clearance to hold med vs. Both)? Medical  Are there any medications that need to be held prior to surgery and how long? No   Practice name and name of physician performing surgery? [facility]; Dr. Milly Jakob  What is the office phone number? [phone]   7.   What is the office fax number? [phone]  8.   Anesthesia type (None, local, MAC, general) ? MAC w/preop block   Durel Salts 04/19/2021, 3:57 PM  _________________________________________________________________   (provider comments below)

## 2021-04-20 ENCOUNTER — Ambulatory Visit: Payer: Medicare Other

## 2021-04-20 NOTE — Telephone Encounter (Signed)
   Primary Cardiologist: Will Meredith Leeds, MD  Chart reviewed as part of pre-operative protocol coverage. Given past medical history and time since last visit, based on ACC/AHA guidelines, David [name] would be at acceptable risk for the planned procedure without further cardiovascular testing.   I will route this recommendation to the requesting party via Epic fax function and remove from pre-op pool.  Please call with questions.  Jossie Ng. Athalia Setterlund NP-C    04/20/2021, 7:12 AM [facility] [address] Office [phone] Fax [phone]

## 2021-04-27 DIAGNOSIS — M65312 Trigger thumb, left thumb: Secondary | ICD-10-CM | POA: Diagnosis not present

## 2021-04-27 DIAGNOSIS — M65322 Trigger finger, left index finger: Secondary | ICD-10-CM | POA: Diagnosis not present

## 2021-04-27 DIAGNOSIS — M720 Palmar fascial fibromatosis [Dupuytren]: Secondary | ICD-10-CM | POA: Diagnosis not present

## 2021-04-27 DIAGNOSIS — G5602 Carpal tunnel syndrome, left upper limb: Secondary | ICD-10-CM | POA: Diagnosis not present

## 2021-05-17 ENCOUNTER — Other Ambulatory Visit: Payer: Self-pay

## 2021-05-17 ENCOUNTER — Ambulatory Visit (INDEPENDENT_AMBULATORY_CARE_PROVIDER_SITE_OTHER): Payer: Medicare Other

## 2021-05-17 VITALS — BP 162/91 | HR 34 | Temp 97.5°F | Resp 16

## 2021-05-17 DIAGNOSIS — J4550 Severe persistent asthma, uncomplicated: Secondary | ICD-10-CM | POA: Diagnosis not present

## 2021-05-17 MED ORDER — METHYLPREDNISOLONE SODIUM SUCC 125 MG IJ SOLR: 125.0000 mg | Freq: Once | INTRAMUSCULAR | Status: DC | PRN

## 2021-05-17 MED ORDER — ALBUTEROL SULFATE HFA 108 (90 BASE) MCG/ACT IN AERS: 2.0000 | INHALATION_SPRAY | Freq: Once | RESPIRATORY_TRACT | Status: DC | PRN

## 2021-05-17 MED ORDER — OMALIZUMAB 150 MG/ML ~~LOC~~ SOSY
300.0000 mg | PREFILLED_SYRINGE | Freq: Once | SUBCUTANEOUS | Status: AC
  Administered 2021-05-17: 300 mg via SUBCUTANEOUS

## 2021-05-17 MED ORDER — FAMOTIDINE IN NACL 20-0.9 MG/50ML-% IV SOLN: 20.0000 mg | Freq: Once | INTRAVENOUS | Status: DC | PRN

## 2021-05-17 MED ORDER — SODIUM CHLORIDE 0.9 % IV SOLN: Freq: Once | INTRAVENOUS | Status: DC | PRN

## 2021-05-17 MED ORDER — DIPHENHYDRAMINE HCL 50 MG/ML IJ SOLN: 50.0000 mg | Freq: Once | INTRAMUSCULAR | Status: DC | PRN

## 2021-05-17 MED ORDER — EPINEPHRINE 0.3 MG/0.3ML IJ SOAJ: 0.3000 mg | Freq: Once | INTRAMUSCULAR | Status: DC | PRN

## 2021-05-17 NOTE — Progress Notes (Signed)
Diagnosis: Asthma  Provider:  Praveen Mannam, MD  Procedure: Injection  Xolair (Omalizumab), Dose: 300 mg, Site: subcutaneous  Discharge: Condition: Good, Destination: Home . AVS provided to patient.   Performed by:  Dominico Rod E, LPN        

## 2021-06-21 ENCOUNTER — Other Ambulatory Visit: Payer: Self-pay

## 2021-06-21 ENCOUNTER — Ambulatory Visit (INDEPENDENT_AMBULATORY_CARE_PROVIDER_SITE_OTHER): Payer: Medicare Other

## 2021-06-21 VITALS — BP 148/71 | HR 37 | Temp 97.8°F | Resp 16 | Ht 69.0 in | Wt 164.8 lb

## 2021-06-21 DIAGNOSIS — J4550 Severe persistent asthma, uncomplicated: Secondary | ICD-10-CM

## 2021-06-21 MED ORDER — ALBUTEROL SULFATE HFA 108 (90 BASE) MCG/ACT IN AERS: 2.0000 | INHALATION_SPRAY | Freq: Once | RESPIRATORY_TRACT | Status: DC | PRN

## 2021-06-21 MED ORDER — EPINEPHRINE 0.3 MG/0.3ML IJ SOAJ: 0.3000 mg | Freq: Once | INTRAMUSCULAR | Status: DC | PRN

## 2021-06-21 MED ORDER — FAMOTIDINE IN NACL 20-0.9 MG/50ML-% IV SOLN: 20.0000 mg | Freq: Once | INTRAVENOUS | Status: DC | PRN

## 2021-06-21 MED ORDER — DIPHENHYDRAMINE HCL 50 MG/ML IJ SOLN: 50.0000 mg | Freq: Once | INTRAMUSCULAR | Status: DC | PRN

## 2021-06-21 MED ORDER — OMALIZUMAB 150 MG/ML ~~LOC~~ SOSY
300.0000 mg | PREFILLED_SYRINGE | Freq: Once | SUBCUTANEOUS | Status: AC
  Administered 2021-06-21: 300 mg via SUBCUTANEOUS
  Filled 2021-06-21: qty 2

## 2021-06-21 MED ORDER — SODIUM CHLORIDE 0.9 % IV SOLN: Freq: Once | INTRAVENOUS | Status: DC | PRN

## 2021-06-21 MED ORDER — METHYLPREDNISOLONE SODIUM SUCC 125 MG IJ SOLR: 125.0000 mg | Freq: Once | INTRAMUSCULAR | Status: DC | PRN

## 2021-06-21 NOTE — Progress Notes (Signed)
Diagnosis: Asthma  Provider:  Praveen Mannam, MD  Procedure: Injection  Xolair (Omalizumab), Dose: 300 mg, Site: subcutaneous  Discharge: Condition: Good, Destination: Home . AVS provided to patient.   Performed by:  Eva Vallee E, LPN        

## 2021-06-24 ENCOUNTER — Other Ambulatory Visit: Payer: Self-pay | Admitting: Critical Care Medicine

## 2021-07-19 ENCOUNTER — Ambulatory Visit (INDEPENDENT_AMBULATORY_CARE_PROVIDER_SITE_OTHER): Payer: Medicare Other | Admitting: *Deleted

## 2021-07-19 ENCOUNTER — Other Ambulatory Visit: Payer: Self-pay

## 2021-07-19 VITALS — BP 169/88 | HR 43 | Temp 97.8°F | Resp 20 | Ht 69.0 in | Wt 165.2 lb

## 2021-07-19 DIAGNOSIS — J4550 Severe persistent asthma, uncomplicated: Secondary | ICD-10-CM

## 2021-07-19 MED ORDER — ALBUTEROL SULFATE HFA 108 (90 BASE) MCG/ACT IN AERS: 2.0000 | INHALATION_SPRAY | Freq: Once | RESPIRATORY_TRACT | Status: DC | PRN

## 2021-07-19 MED ORDER — EPINEPHRINE 0.3 MG/0.3ML IJ SOAJ: 0.3000 mg | Freq: Once | INTRAMUSCULAR | Status: DC | PRN

## 2021-07-19 MED ORDER — METHYLPREDNISOLONE SODIUM SUCC 125 MG IJ SOLR: 125.0000 mg | Freq: Once | INTRAMUSCULAR | Status: DC | PRN

## 2021-07-19 MED ORDER — SODIUM CHLORIDE 0.9 % IV SOLN: Freq: Once | INTRAVENOUS | Status: DC | PRN

## 2021-07-19 MED ORDER — OMALIZUMAB 150 MG/ML ~~LOC~~ SOSY
300.0000 mg | PREFILLED_SYRINGE | Freq: Once | SUBCUTANEOUS | Status: AC
  Administered 2021-07-19: 11:00:00 300 mg via SUBCUTANEOUS
  Filled 2021-07-19: qty 2

## 2021-07-19 MED ORDER — FAMOTIDINE IN NACL 20-0.9 MG/50ML-% IV SOLN: 20.0000 mg | Freq: Once | INTRAVENOUS | Status: DC | PRN

## 2021-07-19 MED ORDER — DIPHENHYDRAMINE HCL 50 MG/ML IJ SOLN: 50.0000 mg | Freq: Once | INTRAMUSCULAR | Status: DC | PRN

## 2021-07-19 NOTE — Progress Notes (Signed)
Diagnosis: Asthma  Provider:  Praveen Mannam, MD  Procedure: Injection  Xolair (Omalizumab), Dose: 300 mg, Site: subcutaneous  Discharge: Condition: Good, Destination: Home . AVS provided to patient.   Performed by:  Lavella Myren A, RN        

## 2021-07-19 NOTE — Patient Instructions (Signed)
[id number] ° °

## 2021-08-06 DIAGNOSIS — M25571 Pain in right ankle and joints of right foot: Secondary | ICD-10-CM | POA: Diagnosis not present

## 2021-08-13 DIAGNOSIS — J45909 Unspecified asthma, uncomplicated: Secondary | ICD-10-CM | POA: Diagnosis not present

## 2021-08-13 DIAGNOSIS — R197 Diarrhea, unspecified: Secondary | ICD-10-CM | POA: Diagnosis not present

## 2021-08-16 ENCOUNTER — Ambulatory Visit (INDEPENDENT_AMBULATORY_CARE_PROVIDER_SITE_OTHER): Payer: Medicare Other

## 2021-08-16 ENCOUNTER — Other Ambulatory Visit: Payer: Self-pay

## 2021-08-16 VITALS — BP 118/72 | HR 45 | Temp 98.1°F | Resp 16 | Ht 69.0 in | Wt 166.0 lb

## 2021-08-16 DIAGNOSIS — J4550 Severe persistent asthma, uncomplicated: Secondary | ICD-10-CM | POA: Diagnosis not present

## 2021-08-16 MED ORDER — FAMOTIDINE IN NACL 20-0.9 MG/50ML-% IV SOLN: 20.0000 mg | Freq: Once | INTRAVENOUS | Status: DC | PRN

## 2021-08-16 MED ORDER — SODIUM CHLORIDE 0.9 % IV SOLN: Freq: Once | INTRAVENOUS | Status: DC | PRN

## 2021-08-16 MED ORDER — ALBUTEROL SULFATE HFA 108 (90 BASE) MCG/ACT IN AERS: 2.0000 | INHALATION_SPRAY | Freq: Once | RESPIRATORY_TRACT | Status: DC | PRN

## 2021-08-16 MED ORDER — DIPHENHYDRAMINE HCL 50 MG/ML IJ SOLN: 50.0000 mg | Freq: Once | INTRAMUSCULAR | Status: DC | PRN

## 2021-08-16 MED ORDER — EPINEPHRINE 0.3 MG/0.3ML IJ SOAJ: 0.3000 mg | Freq: Once | INTRAMUSCULAR | Status: DC | PRN

## 2021-08-16 MED ORDER — OMALIZUMAB 150 MG/ML ~~LOC~~ SOSY
300.0000 mg | PREFILLED_SYRINGE | Freq: Once | SUBCUTANEOUS | Status: AC
  Administered 2021-08-16: 300 mg via SUBCUTANEOUS

## 2021-08-16 MED ORDER — METHYLPREDNISOLONE SODIUM SUCC 125 MG IJ SOLR: 125.0000 mg | Freq: Once | INTRAMUSCULAR | Status: DC | PRN

## 2021-08-16 NOTE — Progress Notes (Signed)
Diagnosis: Asthma  Provider:  Marshell Garfinkel, MD  Procedure: Injection  Xolair (Omalizumab), Dose: 300 mg, Site: subcutaneous, x1 right arm, x1 left armNumber of injections: 2  Discharge: Condition: Good, Destination: Home . AVS provided to patient.   Performed by:  Koren Shiver, RN

## 2021-08-20 DIAGNOSIS — M25571 Pain in right ankle and joints of right foot: Secondary | ICD-10-CM | POA: Diagnosis not present

## 2021-08-29 DIAGNOSIS — Z23 Encounter for immunization: Secondary | ICD-10-CM | POA: Diagnosis not present

## 2021-08-29 DIAGNOSIS — M19071 Primary osteoarthritis, right ankle and foot: Secondary | ICD-10-CM | POA: Diagnosis not present

## 2021-09-13 ENCOUNTER — Ambulatory Visit (INDEPENDENT_AMBULATORY_CARE_PROVIDER_SITE_OTHER): Payer: Medicare Other

## 2021-09-13 VITALS — BP 162/89 | HR 40 | Temp 97.4°F | Resp 16 | Ht 69.0 in | Wt 163.4 lb

## 2021-09-13 DIAGNOSIS — J4550 Severe persistent asthma, uncomplicated: Secondary | ICD-10-CM | POA: Diagnosis not present

## 2021-09-13 MED ORDER — METHYLPREDNISOLONE SODIUM SUCC 125 MG IJ SOLR: 125.0000 mg | Freq: Once | INTRAMUSCULAR | Status: DC | PRN

## 2021-09-13 MED ORDER — ALBUTEROL SULFATE HFA 108 (90 BASE) MCG/ACT IN AERS: 2.0000 | INHALATION_SPRAY | Freq: Once | RESPIRATORY_TRACT | Status: DC | PRN

## 2021-09-13 MED ORDER — DIPHENHYDRAMINE HCL 50 MG/ML IJ SOLN: 50.0000 mg | Freq: Once | INTRAMUSCULAR | Status: DC | PRN

## 2021-09-13 MED ORDER — FAMOTIDINE IN NACL 20-0.9 MG/50ML-% IV SOLN: 20.0000 mg | Freq: Once | INTRAVENOUS | Status: DC | PRN

## 2021-09-13 MED ORDER — SODIUM CHLORIDE 0.9 % IV SOLN: Freq: Once | INTRAVENOUS | Status: DC | PRN

## 2021-09-13 MED ORDER — OMALIZUMAB 150 MG/ML ~~LOC~~ SOSY
300.0000 mg | PREFILLED_SYRINGE | Freq: Once | SUBCUTANEOUS | Status: AC
  Administered 2021-09-13: 300 mg via SUBCUTANEOUS

## 2021-09-13 MED ORDER — EPINEPHRINE 0.3 MG/0.3ML IJ SOAJ: 0.3000 mg | Freq: Once | INTRAMUSCULAR | Status: DC | PRN

## 2021-09-13 NOTE — Progress Notes (Signed)
Diagnosis: Asthma  Provider:  Marshell Garfinkel, MD  Procedure: Injection  Xolair (Omalizumab), Dose: 300 mg, Site: subcutaneous, Number of injections: 2  Discharge: Condition: Good, Destination: Home . AVS provided to patient.   Performed by:  Paul Dykes, RN

## 2021-09-30 ENCOUNTER — Other Ambulatory Visit: Payer: Self-pay | Admitting: Pharmacy Technician

## 2021-10-11 ENCOUNTER — Ambulatory Visit (INDEPENDENT_AMBULATORY_CARE_PROVIDER_SITE_OTHER): Payer: Medicare Other

## 2021-10-11 ENCOUNTER — Other Ambulatory Visit: Payer: Self-pay

## 2021-10-11 VITALS — BP 146/86 | HR 40 | Temp 97.9°F | Resp 16 | Ht 69.0 in | Wt 164.2 lb

## 2021-10-11 DIAGNOSIS — J4550 Severe persistent asthma, uncomplicated: Secondary | ICD-10-CM | POA: Diagnosis not present

## 2021-10-11 MED ORDER — DIPHENHYDRAMINE HCL 50 MG/ML IJ SOLN: 50.0000 mg | Freq: Once | INTRAMUSCULAR | Status: DC | PRN

## 2021-10-11 MED ORDER — OMALIZUMAB 150 MG/ML ~~LOC~~ SOSY
300.0000 mg | PREFILLED_SYRINGE | Freq: Once | SUBCUTANEOUS | Status: AC
  Administered 2021-10-11: 300 mg via SUBCUTANEOUS
  Filled 2021-10-11: qty 2

## 2021-10-11 MED ORDER — EPINEPHRINE 0.3 MG/0.3ML IJ SOAJ: 0.3000 mg | Freq: Once | INTRAMUSCULAR | Status: DC | PRN

## 2021-10-11 MED ORDER — METHYLPREDNISOLONE SODIUM SUCC 125 MG IJ SOLR: 125.0000 mg | Freq: Once | INTRAMUSCULAR | Status: DC | PRN

## 2021-10-11 MED ORDER — ALBUTEROL SULFATE HFA 108 (90 BASE) MCG/ACT IN AERS: 2.0000 | INHALATION_SPRAY | Freq: Once | RESPIRATORY_TRACT | Status: DC | PRN

## 2021-10-11 MED ORDER — SODIUM CHLORIDE 0.9 % IV SOLN: Freq: Once | INTRAVENOUS | Status: DC | PRN

## 2021-10-11 MED ORDER — FAMOTIDINE IN NACL 20-0.9 MG/50ML-% IV SOLN: 20.0000 mg | Freq: Once | INTRAVENOUS | Status: DC | PRN

## 2021-10-11 NOTE — Progress Notes (Signed)
Diagnosis: Asthma  Provider:  Marshell Garfinkel, MD  Procedure: Injection  Xolair (Omalizumab), Dose: 300 mg, Site: subcutaneous, Number of injections: 2  Discharge: Condition: Good, Destination: Home . AVS declined.  Performed by:  Koren Shiver, RN

## 2021-10-18 ENCOUNTER — Ambulatory Visit (INDEPENDENT_AMBULATORY_CARE_PROVIDER_SITE_OTHER): Payer: Medicare Other | Admitting: Pulmonary Disease

## 2021-10-18 ENCOUNTER — Other Ambulatory Visit: Payer: Self-pay

## 2021-10-18 ENCOUNTER — Encounter: Payer: Self-pay | Admitting: Pulmonary Disease

## 2021-10-18 VITALS — BP 104/58 | HR 38 | Ht 69.0 in | Wt 168.4 lb

## 2021-10-18 DIAGNOSIS — J4550 Severe persistent asthma, uncomplicated: Secondary | ICD-10-CM | POA: Diagnosis not present

## 2021-10-18 DIAGNOSIS — Z7952 Long term (current) use of systemic steroids: Secondary | ICD-10-CM

## 2021-10-18 LAB — CBC WITH DIFFERENTIAL/PLATELET
Basophils Absolute: 0 10*3/uL (ref 0.0–0.1)
Basophils Relative: 0.4 % (ref 0.0–3.0)
Eosinophils Absolute: 0 10*3/uL (ref 0.0–0.7)
Eosinophils Relative: 0.1 % (ref 0.0–5.0)
HCT: 46.7 % (ref 39.0–52.0)
Hemoglobin: 15.6 g/dL (ref 13.0–17.0)
Lymphocytes Relative: 14.7 % (ref 12.0–46.0)
Lymphs Abs: 0.9 10*3/uL (ref 0.7–4.0)
MCHC: 33.4 g/dL (ref 30.0–36.0)
MCV: 94.4 fl (ref 78.0–100.0)
Monocytes Absolute: 0.3 10*3/uL (ref 0.1–1.0)
Monocytes Relative: 4.3 % (ref 3.0–12.0)
Neutro Abs: 4.9 10*3/uL (ref 1.4–7.7)
Neutrophils Relative %: 80.5 % — ABNORMAL HIGH (ref 43.0–77.0)
Platelets: 218 10*3/uL (ref 150.0–400.0)
RBC: 4.94 Mil/uL (ref 4.22–5.81)
RDW: 14.4 % (ref 11.5–15.5)
WBC: 6 10*3/uL (ref 4.0–10.5)

## 2021-10-18 MED ORDER — PREDNISONE 10 MG PO TABS: ORAL_TABLET | ORAL | 0 refills | Status: AC

## 2021-10-18 MED ORDER — ALBUTEROL SULFATE HFA 108 (90 BASE) MCG/ACT IN AERS: 2.0000 | INHALATION_SPRAY | RESPIRATORY_TRACT | 1 refills | Status: DC | PRN

## 2021-10-18 MED ORDER — FLUTICASONE-SALMETEROL 230-21 MCG/ACT IN AERO: 2.0000 | INHALATION_SPRAY | Freq: Two times a day (BID) | RESPIRATORY_TRACT | 5 refills | Status: DC

## 2021-10-18 NOTE — Progress Notes (Signed)
Synopsis: Referred in January 2021 for Asthma. Former patient of Dr. Carlis Abbott. Now followed by Dr. [name]  Subjective:   PATIENT ID: David [name] GENDER: male DOB: 10-Apr-1946, MRN: [id number]   HPI  Chief Complaint  Patient presents with   Follow-up    wheezing   David [name] is a 76 year old male never smoker with severe persistent asthma who presents for follow-up for acute symptoms.   He started omalizumab 11/29/19 with significant improvement in his symptoms and has stop using his inhaler on a daily basis. He is using Trelegy ellipta as needed, maybe a couple times per month. Currently followed by Dr. [name] at [facility]. He was last seen on 12/2020 and has not followed up since then.  He was diagnosed with asthma 20 years ago. He was has been on Advair, Symbicort and Trelegy. He was started on Xolair in 2021 however he feels recently it has been less effective. Did not tolerate Symbicort due to cough during inhalation. Trelegy has been starting become less effective as well. He currently has coughing, wheezing and shortness of breath and awakening at night. He is currently on prednisone and symptoms will recur when he is off steroids. He has required steroid tapers six times in the last year.  Past Medical History:  Diagnosis Date   Altitude sickness    Asthma    exercise-induced; prn inhaler   AV block, 2nd degree    Bilateral inguinal hernia (BIH) 10/16/2012   Overview:  [facility] -Dr. Brantley Stage.  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/10/18   Insomnia 07/14/2014   Mobitz type 1 second degree atrioventricular block    Nasal polyps    OA (osteoarthritis) 09/24/2012   Osteoarthritis of right knee 08/06/2018   Reactive airway disease 09/24/2012   Rotator cuff tear, right    impingement, AC arthrosis     Family History   Problem Relation Age of Onset   Heart Problems Mother        had pacemaker, lived to 1   Alzheimer's disease Father    Prostate cancer Father      Social History   Socioeconomic History   Marital status: Married    Spouse name: Not on file   Number of children: Not on file   Years of education: Not on file   Highest education level: Not on file  Occupational History   Not on file  Tobacco Use   Smoking status: Never   Smokeless tobacco: Never  Vaping Use   Vaping Use: Never used  Substance and Sexual Activity   Alcohol use: Yes    Comment: occ. beer   Drug use: No   Sexual activity: Not on file  Other Topics Concern   Not on file  Social History Narrative   Not on file   Social Determinants of Health   Financial Resource Strain: Not on file  Food Insecurity: Not on file  Transportation Needs: Not on file  Physical Activity: Not on file  Stress: Not on file  Social Connections: Not on file  Intimate Partner Violence: Not on file     Allergies  Allergen Reactions   Codeine Itching     Outpatient Medications Prior to Visit  Medication Sig Dispense Refill   albuterol (VENTOLIN HFA) 108 (90 Base) MCG/ACT inhaler Inhale 2 puffs  into the lungs every 4 (four) hours as needed for wheezing or shortness of breath. 8 g 1   Fluticasone-Umeclidin-Vilant (TRELEGY ELLIPTA) 200-62.5-25 MCG/ACT AEPB Inhale 1 puff into the lungs daily as needed (asthma). 60 each 5   omalizumab (XOLAIR) 75 MG/0.5ML prefilled syringe Inject 75 mg into the skin every 28 (twenty-eight) days.     No facility-administered medications prior to visit.    Review of Systems  Constitutional:  Negative for chills, diaphoresis, fever, malaise/fatigue and weight loss.  HENT:  Negative for congestion.   Respiratory:  Positive for cough, shortness of breath and wheezing. Negative for hemoptysis and sputum production.   Cardiovascular:  Negative for chest pain, palpitations and leg swelling.      Objective:   Vitals:   10/18/21 1358  BP: (!) 104/58  Pulse: (!) 38  SpO2: 96%  Weight: 168 lb 6.4 oz (76.4 kg)  Height: 5\' 9"  (1.753 m)    Physical Exam Vitals reviewed.  Constitutional:      General: He is not in acute distress.    Appearance: He is well-developed. He is not diaphoretic.  HENT:     Head: Normocephalic and atraumatic.     Nose: Nose normal.     Mouth/Throat:     Pharynx: No oropharyngeal exudate.  Eyes:     General: No scleral icterus.    Conjunctiva/sclera: Conjunctivae normal.  Neck:     Vascular: No JVD.     Trachea: No tracheal deviation.  Cardiovascular:     Rate and Rhythm: Normal rate and regular rhythm.     Heart sounds: Normal heart sounds. No murmur heard.   No friction rub. No gallop.  Pulmonary:     Effort: Pulmonary effort is normal. No respiratory distress.     Breath sounds: Wheezing present. No rhonchi or rales.  Abdominal:     General: Bowel sounds are normal. There is no distension.     Palpations: Abdomen is soft.     Tenderness: There is no abdominal tenderness.  Musculoskeletal:        General: Normal range of motion.     Cervical back: Normal range of motion and neck supple.  Lymphadenopathy:     Cervical: No cervical adenopathy.  Skin:    General: Skin is warm and dry.     Findings: No erythema or rash.  Neurological:     Mental Status: He is alert and oriented to person, place, and time.     Cranial Nerves: No cranial nerve deficit.  Psychiatric:        Behavior: Behavior normal.        Thought Content: Thought content normal.    CBC    Component Value Date/Time   WBC 6.8 09/21/2020 0951   RBC 4.81 09/21/2020 0951   HGB 15.5 09/21/2020 0951   HGB 14.6 11/06/2018 1257   HCT 46.9 09/21/2020 0951   HCT 45.9 11/06/2018 1257   PLT 203 09/21/2020 0951   PLT 201 11/06/2018 1257   MCV 97.5 09/21/2020 0951   MCV 95 11/06/2018 1257   MCH 32.2 09/21/2020 0951   MCHC 33.0 09/21/2020 0951   RDW 14.3 09/21/2020  0951   RDW 13.0 11/06/2018 1257   LYMPHSABS 1.7 09/21/2020 0951   MONOABS 0.7 09/21/2020 0951   EOSABS 0.5 09/21/2020 0951   BASOSABS 0.1 09/21/2020 0951   BMP Latest Ref Rng & Units 09/21/2020 05/02/2020 02/24/2019  Glucose 70 - 99 mg/dL 87 95 91  BUN 8 - 23 mg/dL  27(H) 30(H) 33(H)  Creatinine 0.61 - 1.24 mg/dL 1.11 1.13 1.37(H)  BUN/Creat Ratio 10 - 24 - - -  Sodium 135 - 145 mmol/L 141 139 140  Potassium 3.5 - 5.1 mmol/L 4.7 4.6 4.6  Chloride 98 - 111 mmol/L 106 101 106  CO2 22 - 32 mmol/L 25 28 24   Calcium 8.9 - 10.3 mg/dL 9.5 9.5 9.1     Chest imaging: CXR 09/2020 No opacities or infiltrates. Left basilar atelectasis.   CTA chest 8/4/201  Mild bibasilar dependent opacities.  No significant mediastinal or hilar adenopathy.  No PE.  No bronchiectasis or concern for chronic pulmonary infection.  PFT: PFT Results Latest Ref Rng & Units 10/18/2019  FVC-Pre L 3.65  FVC-Predicted Pre % 91  FVC-Post L 3.73  FVC-Predicted Post % 93  Pre FEV1/FVC % % 62  Post FEV1/FCV % % 66  FEV1-Pre L 2.27  FEV1-Predicted Pre % 78  FEV1-Post L 2.46  DLCO uncorrected ml/min/mmHg 26.60  DLCO UNC% % 111  DLVA Predicted % 101  TLC L 6.85  TLC % Predicted % 104  RV % Predicted % 128  2021-moderate obstruction without significant bronchodilator reversibility air trapping without hyperinflation.  Normal diffusion.  Flow volume loop suggests obstruction.  Labs: IgE 297  Echocardiogram 04/05/2018: LVEF 55 to 60%, no regional wall motion abnormalities.  Normal LA, mildly dilated RV, normal RA.  Normal PASP.  Trivial TR, otherwise normal valves.   Heart Catheterization 2019: EF 55 to 65%, normal LV function and normal LVEDP.  No aortic stenosis, no significant coronary artery disease.    Assessment & Plan:   Severe persistent asthma dependent on systemic steroids - Plan: CBC w/Diff, IgE, IgE, CBC w/Diff  Discussion: David [name] is a 76 year old male never smoker with severe persistent asthma on  Xolair who presents for follow-up for acute symptoms. Uncontrolled baseline symptoms.  Discussed biologic agents including Xolair (anti-IgE), Nucala (anti-IL-5), Fasenra (anti-IL-5 receptor alpha) and Dupixent (anti-IL-4 receptor subunit alpha). I believe patient would benefit from biologic agent given uncontrolled symptoms, multiple exacerbations and eosinophilia. We discussed the risks and benefits of these type of medications including anaphylaxis. Patient expressed understanding and would like to pursue treatment if eligible.  Severe persistent asthma in active exacerbation START prednisone taper (60x3d, 50x3d, 40x3d etc) START Advair 230-21 mcg TWO puffs TWICE a day. Use with space CONTINUE Xolair until we can enroll you into Dupixent ORDER spacer ORDER CBC with diff and IgE Enroll in Dupixent or Te[facility]spire pending test results  Return in about 3 months (around 01/15/2022).   I have spent a total time of 40-minutes on the day of the appointment reviewing prior documentation, coordinating care and discussing medical diagnosis and plan with the patient/family. Past medical history, allergies, medications were reviewed. Pertinent imaging, labs and tests included in this note have been reviewed and interpreted independently by me.  Rodman Pickle, MD [facility] Office: [phone]     Current Outpatient Medications:    albuterol (VENTOLIN HFA) 108 (90 Base) MCG/ACT inhaler, Inhale 2 puffs into the lungs every 4 (four) hours as needed for wheezing or shortness of breath., Disp: 8 g, Rfl: 1   Fluticasone-Umeclidin-Vilant (TRELEGY ELLIPTA) 200-62.5-25 MCG/ACT AEPB, Inhale 1 puff into the lungs daily as needed (asthma)., Disp: 60 each, Rfl: 5   omalizumab (XOLAIR) 75 MG/0.5ML prefilled syringe, Inject 75 mg into the skin every 28 (twenty-eight) days., Disp: , Rfl:

## 2021-10-18 NOTE — Patient Instructions (Addendum)
Severe persistent asthma with exacerbation, uncontrolled at baseline --START prednisone taper (60x3d, 50x3d, 40x3d etc) --START Advair 230-21 mcg TWO puffs TWICE a day. Use with space --CONTINUE Xolair until we can enroll you into Dupixent --ORDER spacer --ORDER CBC with diff and IgE  Follow-up with me in 3 months

## 2021-10-19 ENCOUNTER — Telehealth: Payer: Self-pay

## 2021-10-19 ENCOUNTER — Other Ambulatory Visit (HOSPITAL_COMMUNITY): Payer: Self-pay

## 2021-10-19 ENCOUNTER — Encounter: Payer: Self-pay | Admitting: Internal Medicine

## 2021-10-19 ENCOUNTER — Encounter: Payer: Self-pay | Admitting: Pulmonary Disease

## 2021-10-19 DIAGNOSIS — J4550 Severe persistent asthma, uncomplicated: Secondary | ICD-10-CM

## 2021-10-19 LAB — IGE: IgE (Immunoglobulin E), Serum: 602 kU/L — ABNORMAL HIGH (ref <=114)

## 2021-10-19 NOTE — Telephone Encounter (Signed)
Received New start paperwork for David [name]. Will update as we work through the benefits process.  Submitted a Prior Authorization request to [facility] for [name] via CoverMyMeds. Will update once we receive a response.   Key: [id number]

## 2021-10-19 NOTE — Telephone Encounter (Signed)
Received notification from [facility] regarding a prior authorization for [place]. Authorization has been APPROVED from 09/02/2021 to 10/19/2022.   Per test claim, copay for 28 days supply (including loading dose) is $2,207.84  Patient can fill through [place]: [phone]   Authorization # [id number]   Submitted Patient Assistance Application to [place] for [place] along with provider portion, PA and income documents. Will update patient when we receive a response.  Fax# [phone] Phone# [phone]

## 2021-10-20 ENCOUNTER — Encounter: Payer: Self-pay | Admitting: Pulmonary Disease

## 2021-10-22 NOTE — Telephone Encounter (Signed)
I called patient and addressed his questions regarding his lab results  He reports his is continuing to have productive cough, wheezing or shortness of breath.   Staff -  SCHEDULE patient for follow-up visit with NP for a steroid shot ARRANGE for nebulizer ORDER Duonebs q6h. Refill x 3 CONTINUE steroids  We are still awaiting Dupixent --Pharmacy team, can we give him samples for early start now that he is approved?  Rodman Pickle, M.D. [facility] 10/22/2021 6:38 PM   See Amion for personal pager For hours between 7 PM to 7 AM, please call Elink for urgent questions

## 2021-10-23 MED ORDER — IPRATROPIUM-ALBUTEROL 0.5-2.5 (3) MG/3ML IN SOLN: 3.0000 mL | Freq: Four times a day (QID) | RESPIRATORY_TRACT | 1 refills | Status: DC | PRN

## 2021-10-23 NOTE — Telephone Encounter (Signed)
Called and spoke with patient. I was able to get him scheduled to see Katie tomorrow at 11am. He verbalized understanding of appt date and time.   Orders have been placed for nebulizer and Duonebs to come from a DME company.

## 2021-10-24 ENCOUNTER — Ambulatory Visit (INDEPENDENT_AMBULATORY_CARE_PROVIDER_SITE_OTHER): Payer: Medicare Other | Admitting: Nurse Practitioner

## 2021-10-24 ENCOUNTER — Other Ambulatory Visit: Payer: Self-pay

## 2021-10-24 ENCOUNTER — Encounter: Payer: Self-pay | Admitting: Nurse Practitioner

## 2021-10-24 ENCOUNTER — Ambulatory Visit (INDEPENDENT_AMBULATORY_CARE_PROVIDER_SITE_OTHER): Payer: Medicare Other

## 2021-10-24 VITALS — BP 100/60 | HR 45 | Temp 98.4°F | Ht 69.0 in | Wt 168.0 lb

## 2021-10-24 DIAGNOSIS — J310 Chronic rhinitis: Secondary | ICD-10-CM

## 2021-10-24 DIAGNOSIS — I442 Atrioventricular block, complete: Secondary | ICD-10-CM

## 2021-10-24 DIAGNOSIS — J4550 Severe persistent asthma, uncomplicated: Secondary | ICD-10-CM

## 2021-10-24 DIAGNOSIS — J069 Acute upper respiratory infection, unspecified: Secondary | ICD-10-CM | POA: Diagnosis not present

## 2021-10-24 DIAGNOSIS — J40 Bronchitis, not specified as acute or chronic: Secondary | ICD-10-CM | POA: Diagnosis not present

## 2021-10-24 DIAGNOSIS — Z7952 Long term (current) use of systemic steroids: Secondary | ICD-10-CM

## 2021-10-24 DIAGNOSIS — J4551 Severe persistent asthma with (acute) exacerbation: Secondary | ICD-10-CM | POA: Diagnosis not present

## 2021-10-24 DIAGNOSIS — R059 Cough, unspecified: Secondary | ICD-10-CM | POA: Diagnosis not present

## 2021-10-24 DIAGNOSIS — R0602 Shortness of breath: Secondary | ICD-10-CM | POA: Diagnosis not present

## 2021-10-24 LAB — NITRIC OXIDE: Nitric Oxide: 11

## 2021-10-24 MED ORDER — METHYLPREDNISOLONE ACETATE 80 MG/ML IJ SUSP
120.0000 mg | Freq: Once | INTRAMUSCULAR | Status: AC
  Administered 2021-10-24: 120 mg via INTRAMUSCULAR

## 2021-10-24 MED ORDER — AZITHROMYCIN 250 MG PO TABS: ORAL_TABLET | ORAL | 0 refills | Status: DC

## 2021-10-24 MED ORDER — DM-GUAIFENESIN ER 30-600 MG PO TB12: 1.0000 | ORAL_TABLET | Freq: Two times a day (BID) | ORAL | 0 refills | Status: DC

## 2021-10-24 MED ORDER — FLUTICASONE PROPIONATE 50 MCG/ACT NA SUSP: 1.0000 | Freq: Every day | NASAL | 2 refills | Status: AC

## 2021-10-24 NOTE — Assessment & Plan Note (Addendum)
Persistent productive cough and SOB. URI symptoms which started 2-3 weeks ago. Suspect persistent cough multifactorial r/t recent acute asthma exacerbation with bronchitis and upper airway irritation. CXR without acute process; linear density in lingula, unchanged and likely scarring. Depo inj x 1 in office.  Continue pred taper as previously directed.   Patient Instructions  -Continue Advair 2 puffs Twice daily. Brush tongue and rinse mouth afterwards -Continue Albuterol inhaler 2 puffs or Duoneb 3 mL neb every 6 hours as needed for shortness of breath or wheezing. Notify if symptoms persist despite rescue inhaler/neb use. -Continue Xolair until you start Dupixent injections -Continue prednisone taper as directed   Flonase nasal spray 2 sprays each nostril  Saline nasal irrigations 1-2 times per day Mucinex DM 600 mg Twice daily for cough/congestion until symptoms improve   Chest x ray today. We will notify you of results and decide next steps for antibiotic therapy.   Follow up in one week with Dr. Loanne Drilling, Dr. [name] or Katie Charvis Lightner,NP. If symptoms do not improve or worsen, please contact office for sooner follow up or seek emergency care.

## 2021-10-24 NOTE — Patient Instructions (Addendum)
-  Continue Advair 2 puffs Twice daily. Brush tongue and rinse mouth afterwards -Continue Albuterol inhaler 2 puffs or Duoneb 3 mL neb every 6 hours as needed for shortness of breath or wheezing. Notify if symptoms persist despite rescue inhaler/neb use. -Continue Xolair until you start Dupixent injections -Continue prednisone taper as directed   Flonase nasal spray 2 sprays each nostril  Saline nasal irrigations 1-2 times per day Mucinex DM 600 mg Twice daily for cough/congestion until symptoms improve   Chest x ray today. We will notify you of results and decide next steps for antibiotic therapy.   Follow up in one week with Dr. Loanne Drilling, Dr. [name] or Katie Caliana Spires,NP. If symptoms do not improve or worsen, please contact office for sooner follow up or seek emergency care.

## 2021-10-24 NOTE — Progress Notes (Signed)
Contact patient to notify CXR without acute process. Chronic changes, consistent with scarring. Will send in for z pack for bronchitis/sinus infection. Verbalized understanding.

## 2021-10-24 NOTE — Assessment & Plan Note (Addendum)
URI vs allergic rhinitis x 2-3 weeks. Addition of mucinex DM, flonase, and saline nasal irrigations for postnasal drip and cough control.

## 2021-10-24 NOTE — Progress Notes (Signed)
@Patient  ID: David [name], male    DOB: 11-26-1945, 76 y.o.   MRN: [id number]  Chief Complaint  Patient presents with   Follow-up    He reports he is not feeling any better, has some productive brown to cream sputum at time.     Referring provider: Wayland Salinas, *  HPI: 76 year old male, never smoker followed for severe persistent asthma on Xolair and daily prednisone, and allergic rhinitis. He is a patient of Dr. August Albino and last seen in office on 10/18/2021 by Dr. Loanne Drilling. Past medical history significant for complete heart block, osteoarthritis, inguinal hernia, insomnia.   TEST/EVENTS:  10/18/2021: Eos 0.1, IgE 602  10/18/2021: OV with Dr. Loanne Drilling. Asthma exacerbation tx with extended prednisone course. Has required 6 prednisone tapers in past year. Changed from Xolair to [place] or [place] depending on lab results. Started on Advair 230-21 2 puffs Twice daily. Eos 0.1 and IgE 602  10/24/2021: Today - acute visit Patient presents today for acute visit. He was instructed to come in for steroid injection by Dr. Loanne Drilling. He reports that he has had minimal improvement in his symptoms since he was last seen on 2/16, despite prednisone taper. He continues to have a productive cough with white to brown sputum production. He also has been having increased nasal congestion and drainage. Shortness of breath is minimally improved and occurs upon exertion. He has also noted associated wheezing. He denies any fevers, orthopnea, PND, chest pain or lower extremity swelling. He continues on his Advair. Feels as though his rescue does not help his symptoms so doesn't use it much. He is waiting to be transitioned to [place] from [place].   Allergies  Allergen Reactions   Codeine Itching    Immunization History  Administered Date(s) Administered   DTaP 04/06/1996   Fluad Quad(high Dose 65+) 07/03/2020   Influenza Split 08/02/2012, 07/05/2013   Influenza Whole 07/03/2016    Influenza-Unspecified 06/16/2019   PFIZER(Purple Top)SARS-COV-2 Vaccination 09/22/2019, 10/13/2019, 06/06/2020   Pneumococcal Conjugate-13 08/31/2008, 05/23/2016   Pneumococcal Polysaccharide-23 11/02/2013   Tdap 09/25/2007, 08/31/2008   Zoster, Live 08/31/2008    Past Medical History:  Diagnosis Date   Altitude sickness    Asthma    exercise-induced; prn inhaler   AV block, 2nd degree    Bilateral inguinal hernia (BIH) 10/16/2012   Overview:  [facility] -Dr. Brantley Stage.  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, post right TKA 08/10/18   Insomnia 07/14/2014   Mobitz type 1 second degree atrioventricular block    Nasal polyps    OA (osteoarthritis) 09/24/2012   Osteoarthritis of right knee 08/06/2018   Reactive airway disease 09/24/2012   Rotator cuff tear, right    impingement, AC arthrosis    Tobacco History: Social History   Tobacco Use  Smoking Status Never  Smokeless Tobacco Never   Counseling given: Not Answered   Outpatient Medications Prior to Visit  Medication Sig Dispense Refill   albuterol (VENTOLIN HFA) 108 (90 Base) MCG/ACT inhaler Inhale 2 puffs into the lungs every 4 (four) hours as needed for wheezing or shortness of breath. 8 g 1   fluticasone-salmeterol (ADVAIR HFA) 230-21 MCG/ACT inhaler Inhale 2 puffs into the lungs 2 (two) times daily. 1 each 5   ipratropium-albuterol (DUONEB) 0.5-2.5 (3) MG/3ML SOLN Take 3 mLs by nebulization every 6 (six) hours as needed.  360 mL 1   omalizumab (XOLAIR) 75 MG/0.5ML prefilled syringe Inject 75 mg into the skin every 28 (twenty-eight) days.     predniSONE (DELTASONE) 10 MG tablet Take 6 tablets (60 mg total) by mouth daily with breakfast for 3 days, THEN 5 tablets (50 mg total) daily with breakfast for 3 days, THEN 4 tablets (40 mg total) daily with breakfast for 3 days, THEN 3 tablets (30 mg total)  daily with breakfast for 3 days, THEN 2 tablets (20 mg total) daily with breakfast for 3 days, THEN 1 tablet (10 mg total) daily with breakfast for 3 days. 63 tablet 0   Fluticasone-Umeclidin-Vilant (TRELEGY ELLIPTA) 200-62.5-25 MCG/ACT AEPB Inhale 1 puff into the lungs daily as needed (asthma). (Patient not taking: Reported on 10/24/2021) 60 each 5   No facility-administered medications prior to visit.     Review of Systems:   Constitutional: No weight loss or gain, night sweats, fevers, chills, fatigue, or lassitude. HEENT: No headaches, difficulty swallowing, tooth/dental problems, or sore throat. No sneezing, itching, ear ache. +nasal congestion with drainage, postnasal drip CV:  No chest pain, orthopnea, PND, swelling in lower extremities, anasarca, dizziness, palpitations, syncope Resp: +shortness of breath with exertion (minimal improvement); wheezing; productive cough. No hemoptysis. No chest wall deformity GI:  No heartburn, indigestion, abdominal pain, nausea, vomiting, diarrhea, change in bowel habits, loss of appetite, bloody stools.  GU: No dysuria, change in color of urine, urgency or frequency.  No flank pain, no hematuria  Skin: No rash, lesions, ulcerations MSK:  No joint pain or swelling.  No decreased range of motion.  No back pain. Neuro: No dizziness or lightheadedness.  Psych: No depression or anxiety. Mood stable.     Physical Exam:  BP 100/60 (BP Location: Left Arm, Patient Position: Sitting, Cuff Size: Normal)    Pulse (!) 45    Temp 98.4 F (36.9 C) (Oral)    Ht 5\' 9"  (1.753 m)    Wt 168 lb (76.2 kg)    SpO2 98%    BMI 24.81 kg/m   GEN: Pleasant, interactive, well-appearing; in no acute distress. HEENT:  Normocephalic and atraumatic. EACs patent bilaterally. TM pearly gray with present light reflex bilaterally. PERRLA. Sclera white. Nasal turbinates pink, moist and patent bilaterally. Clear rhinorrhea present. Oropharynx erythematous and moist, without exudate or  edema. No lesions, ulcerations  NECK:  Supple w/ fair ROM. No JVD present. Normal carotid impulses w/o bruits. Thyroid symmetrical with no goiter or nodules palpated. No lymphadenopathy.   CV: Bradycardia, regular rhythm, no m/r/g, no peripheral edema. Pulses intact, +2 bilaterally. No cyanosis, pallor or clubbing. PULMONARY:  Unlabored, regular breathing. Scattered wheezes bilaterally A&P. No accessory muscle use. No dullness to percussion. GI: BS present and normoactive. Soft, non-tender to palpation. No organomegaly or masses detected. No CVA tenderness. MSK: No erythema, warmth or tenderness. Cap refil <2 sec all extrem. No deformities or joint swelling noted.  Neuro: A/Ox3. No focal deficits noted.   Skin: Warm, no lesions or rashe Psych: Normal affect and behavior. Judgement and thought content appropriate.     Lab Results:  CBC    Component Value Date/Time   WBC 6.0 10/18/2021 1452   RBC 4.94 10/18/2021 1452   HGB 15.6 10/18/2021 1452   HGB 14.6 11/06/2018 1257   HCT 46.7 10/18/2021 1452   HCT 45.9 11/06/2018 1257   PLT 218.0 10/18/2021 1452   PLT 201 11/06/2018 1257   MCV 94.4 10/18/2021 1452   MCV 95 11/06/2018 1257  MCH 32.2 09/21/2020 0951   MCHC 33.4 10/18/2021 1452   RDW 14.4 10/18/2021 1452   RDW 13.0 11/06/2018 1257   LYMPHSABS 0.9 10/18/2021 1452   MONOABS 0.3 10/18/2021 1452   EOSABS 0.0 10/18/2021 1452   BASOSABS 0.0 10/18/2021 1452    BMET    Component Value Date/Time   NA 141 09/21/2020 0951   NA 138 11/06/2018 1257   K 4.7 09/21/2020 0951   CL 106 09/21/2020 0951   CO2 25 09/21/2020 0951   GLUCOSE 87 09/21/2020 0951   BUN 27 (H) 09/21/2020 0951   BUN 20 11/06/2018 1257   CREATININE 1.11 09/21/2020 0951   CALCIUM 9.5 09/21/2020 0951   GFRNONAA >60 09/21/2020 0951   GFRAA >60 05/02/2020 1327    BNP    Component Value Date/Time   BNP 301.3 (H) 04/04/2018 2139     Imaging:  DG Chest 2 View  Result Date: 10/24/2021 CLINICAL DATA:   Short of breath, productive cough EXAM: CHEST - 2 VIEW COMPARISON:  09/11/2020 FINDINGS: Mild cardiac enlargement unchanged.  Pulmonary vascularity normal. Linear density in the lingula was present previously and likely is scar. No acute infiltrate or effusion. No interval change. IMPRESSION: No active cardiopulmonary disease. Electronically Signed   By: Franchot Gallo M.D.   On: 10/24/2021 12:44    methylPREDNISolone acetate (DEPO-MEDROL) injection 120 mg     Date Action Dose Route User   10/24/2021 1245 Given 120 mg Intramuscular (Left Upper Outer Quadrant) Mabe, Beatris Ship, CMA      omalizumab Arvid Right) prefilled syringe 300 mg     Date Action Dose Route User   09/13/2021 1109 Given 300 mg Subcutaneous (Left Arm) Ruthell Rummage A, RN      omalizumab Arvid Right) prefilled syringe 300 mg     Date Action Dose Route User   10/11/2021 1045 Given 300 mg Subcutaneous (Left Arm) Koren Shiver, RN       PFT Results Latest Ref Rng & Units 10/18/2019  FVC-Pre L 3.65  FVC-Predicted Pre % 91  FVC-Post L 3.73  FVC-Predicted Post % 93  Pre FEV1/FVC % % 62  Post FEV1/FCV % % 66  FEV1-Pre L 2.27  FEV1-Predicted Pre % 78  FEV1-Post L 2.46  DLCO uncorrected ml/min/mmHg 26.60  DLCO UNC% % 111  DLVA Predicted % 101  TLC L 6.85  TLC % Predicted % 104  RV % Predicted % 128    Lab Results  Component Value Date   NITRICOXIDE 66 05/29/2016        Assessment & Plan:   Severe persistent asthma Persistent productive cough and SOB. URI symptoms which started 2-3 weeks ago. Suspect persistent cough multifactorial r/t recent acute asthma exacerbation with bronchitis and upper airway irritation. CXR without acute process; linear density in lingula, unchanged and likely scarring. Depo inj x 1 in office.  Continue pred taper as previously directed.   Patient Instructions  -Continue Advair 2 puffs Twice daily. Brush tongue and rinse mouth afterwards -Continue Albuterol inhaler 2 puffs or Duoneb 3 mL  neb every 6 hours as needed for shortness of breath or wheezing. Notify if symptoms persist despite rescue inhaler/neb use. -Continue Xolair until you start Dupixent injections -Continue prednisone taper as directed   Flonase nasal spray 2 sprays each nostril  Saline nasal irrigations 1-2 times per day Mucinex DM 600 mg Twice daily for cough/congestion until symptoms improve   Chest x ray today. We will notify you of results and decide next steps for antibiotic  therapy.   Follow up in one week with Dr. Loanne Drilling, Dr. [name] or Katie Rumeal Cullipher,NP. If symptoms do not improve or worsen, please contact office for sooner follow up or seek emergency care.     URI (upper respiratory infection) URI vs allergic rhinitis x 2-3 weeks. Addition of mucinex DM, flonase, and saline nasal irrigations for postnasal drip and cough control.  Bronchitis Bronchitic sounding cough. Do suspect cough is multifactorial. Will tx with z pack given persistent symptoms despite prednisone. CXR was without any evidence of superimposed infection.  Complete heart block (HCC) Hx of complete heart block; preferred to not undergo pacer surgery. Has been stable per cardiology's notes. Bradycardic today; stable BP. Continue follow up with cardiology as scheduled.      Clayton Bibles, NP 10/24/2021  Pt aware and understands NP's role.

## 2021-10-24 NOTE — Addendum Note (Signed)
Addended by: Dessie Coma on: 10/24/2021 05:01 PM   Modules accepted: Orders

## 2021-10-24 NOTE — Assessment & Plan Note (Signed)
Bronchitic sounding cough. Do suspect cough is multifactorial. Will tx with z pack given persistent symptoms despite prednisone. CXR was without any evidence of superimposed infection.

## 2021-10-24 NOTE — Assessment & Plan Note (Signed)
Hx of complete heart block; preferred to not undergo pacer surgery. Has been stable per cardiology's notes. Bradycardic today; stable BP. Continue follow up with cardiology as scheduled.

## 2021-11-07 ENCOUNTER — Ambulatory Visit (INDEPENDENT_AMBULATORY_CARE_PROVIDER_SITE_OTHER): Payer: Medicare Other | Admitting: *Deleted

## 2021-11-07 VITALS — BP 156/86 | HR 38 | Temp 97.7°F | Resp 16 | Ht 69.0 in | Wt 165.8 lb

## 2021-11-07 DIAGNOSIS — J4550 Severe persistent asthma, uncomplicated: Secondary | ICD-10-CM | POA: Diagnosis not present

## 2021-11-07 MED ORDER — SODIUM CHLORIDE 0.9 % IV SOLN: Freq: Once | INTRAVENOUS | Status: DC | PRN

## 2021-11-07 MED ORDER — METHYLPREDNISOLONE SODIUM SUCC 125 MG IJ SOLR: 125.0000 mg | Freq: Once | INTRAMUSCULAR | Status: DC | PRN

## 2021-11-07 MED ORDER — FAMOTIDINE IN NACL 20-0.9 MG/50ML-% IV SOLN: 20.0000 mg | Freq: Once | INTRAVENOUS | Status: DC | PRN

## 2021-11-07 MED ORDER — EPINEPHRINE 0.3 MG/0.3ML IJ SOAJ: 0.3000 mg | Freq: Once | INTRAMUSCULAR | Status: DC | PRN

## 2021-11-07 MED ORDER — OMALIZUMAB 150 MG/ML ~~LOC~~ SOSY
300.0000 mg | PREFILLED_SYRINGE | Freq: Once | SUBCUTANEOUS | Status: AC
  Administered 2021-11-07: 300 mg via SUBCUTANEOUS
  Filled 2021-11-07: qty 2

## 2021-11-07 MED ORDER — ALBUTEROL SULFATE HFA 108 (90 BASE) MCG/ACT IN AERS: 2.0000 | INHALATION_SPRAY | Freq: Once | RESPIRATORY_TRACT | Status: DC | PRN

## 2021-11-07 MED ORDER — DIPHENHYDRAMINE HCL 50 MG/ML IJ SOLN: 50.0000 mg | Freq: Once | INTRAMUSCULAR | Status: DC | PRN

## 2021-11-07 NOTE — Progress Notes (Signed)
Diagnosis: Asthma ? ?Provider:  Marshell Garfinkel, MD ? ?Procedure: Injection ? ?Xolair (Omalizumab), Dose: 300 mg, Site: subcutaneous, Number of injections: 2 ? ?Discharge: Condition: Good, Destination: Home . AVS provided to patient.  ? ?Performed by:  Ludwig Lean, RN  ? ? ? ?  ?

## 2021-11-08 ENCOUNTER — Ambulatory Visit: Payer: Medicare Other

## 2021-11-09 NOTE — Telephone Encounter (Signed)
Called [employer] regarding patient's application status. Per rep, a nurse is scheduled to reach out to patient today at some point. I called patient to provide with phone number and recommended he pick up call and express inability to afford medication. I reviewed copay with him. I reviewed that program approval would allow him to get the medication at no cost. He verbalized understanding and will pick up phone. I also advised him to reach out to the company if he does not hear from from nurse today and express inability to pay for medication ? ?Knox Saliva, PharmD, MPH, BCPS ?Clinical Pharmacist (Rheumatology and Pulmonology) ?

## 2021-11-19 NOTE — Telephone Encounter (Signed)
Called [employer] for update on patient's application. Per rep, patient's nurse educator did not reach out to patient on 11/09/21 or any day thereafter. Patient also did not reach out to company despite being advised to reach out to company if he did not receive any calls. Nurse will reach out to patient today. ? ?Phone: [phone], Option 1, Option 2, Option 2 ? ?Knox Saliva, PharmD, MPH, BCPS ?Clinical Pharmacist (Rheumatology and Pulmonology) ?

## 2021-11-23 NOTE — Telephone Encounter (Signed)
Called [employer] for update on patient's application for PAP. Per rep, expected turnaround time is 24-48 hours. ? ?Determination letter will be sent to clinic in 24-48 hours. ? ?Phone: [phone], Option 1, Option 2, Option 2 ? ?Knox Saliva, PharmD, MPH, BCPS ?Clinical Pharmacist (Rheumatology and Pulmonology) ?

## 2021-11-28 ENCOUNTER — Telehealth: Payer: Self-pay | Admitting: Pulmonary Disease

## 2021-11-28 ENCOUNTER — Encounter: Payer: Self-pay | Admitting: Pulmonary Disease

## 2021-11-28 NOTE — Telephone Encounter (Signed)
Mychart message sent by pt: ?[name] "[name]"  P [facility] (supporting Margaretha Seeds, MD) 52 minutes ago (2:19 PM)  ? ?Dr. Loanne Drilling, ?  ?     Since you have had me on Zolair and Advair, I have been completely clear of any asthma issues for the past 30 days. I realize that you have me scheduled over to [place], but is this wise if the current medication is working? ?  ?Thanks, ?  ?David [name] ? ? ? ?Dr. Loanne Drilling, please advise. ?

## 2021-11-28 NOTE — Telephone Encounter (Signed)
Patient wanting to hold off on Dupixent for now as symptoms controlled on high dose Advair and Xolair. Per our conversation he has been told by our office today that he has been approved however no telephone encounter noted. After discussion will continue Xolair for now and if he continues to have exacerbations, would recommend switching to [place]. He is also concerned about cost. ? ?Message to Pharmacy team: ? ?1) Continue monthly Xolair ? ?2) What is the cost to patient for Dupixent injections if/when he does start? ? ? ?

## 2021-11-28 NOTE — Telephone Encounter (Signed)
Received a fax from  [place] regarding an approval for [place] patient assistance from 11/28/21 to 09/01/22.  ? ?ATC patient to schedule Dupixent new start visit. Unable to reach. Left  detailed VM regarding approval and advised him to pick up calls from company. Also provided with my direct office number requesting return call ? ?David [name], PharmD, MPH, BCPS ?Clinical Pharmacist (Rheumatology and Pulmonology) ?

## 2021-11-29 ENCOUNTER — Other Ambulatory Visit (HOSPITAL_COMMUNITY): Payer: Self-pay

## 2021-11-29 ENCOUNTER — Telehealth: Payer: Self-pay | Admitting: Pharmacist

## 2021-11-29 NOTE — Telephone Encounter (Signed)
Patient will not be transitiioning to [place]. Patient's asthma is currently controlled on Advair + Xolair. He reviewed with Dr. Loanne Drilling and they have reached this decision jointly. ? ?Returned call to [place] to ensure medication rx is for pens and subsequently place medication rx on hold. Spoke with Rph, Melissa, who placed rx on hold with note stating that either patient or provider will reach out once and if patient chooses to start therapy. ? ?Knox Saliva, PharmD, MPH, BCPS ?Clinical Pharmacist (Rheumatology and Pulmonology) ?

## 2021-11-29 NOTE — Telephone Encounter (Signed)
Patient is approved for [employer] patient assistance - no cost for medication to patient through 09/01/22. Note was entered yesterday under [place] telephone encounter. ? ?Pharmacy team will run prior authorization for Xolair to ensure it has not been cancelled out, and patient can continue filling. ? ?Knox Saliva, PharmD, MPH, BCPS ?Clinical Pharmacist (Rheumatology and Pulmonology) ? ? ?

## 2021-11-29 NOTE — Telephone Encounter (Signed)
A new PA would be required for pt to resume Xolair. I can get one submitted, however it will likely cancel out the [place] PA in response. Please advise whether or not to proceed. ?

## 2021-11-29 NOTE — Telephone Encounter (Signed)
Submitted a Prior Authorization request to [facility] for XOLAIR via CoverMyMeds. Will update once we receive a response. ? ? ?Key: [id number] ?

## 2021-11-29 NOTE — Telephone Encounter (Signed)
Received notification from [facility] regarding a prior authorization for XOLAIR. Authorization has been APPROVED from 09/02/2021 to 11/29/2022. Approval letter sent to scan center. ? ?Authorization # [id number] ?

## 2021-11-29 NOTE — Telephone Encounter (Signed)
Patient recently enrolled into [place] patient assistance but discussed with Dr. Loanne Drilling that he would like to remain on Xolair. Since authorization for Xolair may be cancelled once Dupixent was authorized, pharmacy team will re-run [place] for [place]. ? ?Knox Saliva, PharmD, MPH, BCPS ?Clinical Pharmacist (Rheumatology and Pulmonology) ?

## 2021-11-30 NOTE — Telephone Encounter (Signed)
Left VM for patient to inquire if he would like to self-administer Xolair (would still have to complete [place] pt assistance process) or continue receiving at [facility] long-term. Will f/u with patient ? ?Knox Saliva, PharmD, MPH, BCPS ?Clinical Pharmacist (Rheumatology and Pulmonology) ?

## 2021-12-03 NOTE — Telephone Encounter (Addendum)
Spoke with patient and reviewed that David [name] is approved through patient assistance through 09/01/22. Patient verbalized understanding. States that he is comfortable with continuing to receive Xolair at infusion center ?Therapy plan for Xolair remains in place ? ?Knox Saliva, PharmD, MPH, BCPS ?Clinical Pharmacist (Rheumatology and Pulmonology) ?

## 2021-12-06 ENCOUNTER — Ambulatory Visit (INDEPENDENT_AMBULATORY_CARE_PROVIDER_SITE_OTHER): Payer: Medicare Other

## 2021-12-06 VITALS — BP 148/82 | HR 38 | Temp 100.4°F | Resp 18 | Ht 69.0 in | Wt 166.6 lb

## 2021-12-06 DIAGNOSIS — J4550 Severe persistent asthma, uncomplicated: Secondary | ICD-10-CM

## 2021-12-06 MED ORDER — OMALIZUMAB 150 MG/ML ~~LOC~~ SOSY
300.0000 mg | PREFILLED_SYRINGE | Freq: Once | SUBCUTANEOUS | Status: AC
  Administered 2021-12-06: 300 mg via SUBCUTANEOUS
  Filled 2021-12-06: qty 2

## 2021-12-06 NOTE — Progress Notes (Signed)
Diagnosis: Asthma ? ?Provider:  Marshell Garfinkel, MD ? ?Procedure: Injection ? ?Xolair (Omalizumab), Dose: 300 mg, Site: subcutaneous, Number of injections: 2 ? ?Discharge: Condition: Good, Destination: Home . AVS declined. ? ?Performed by:  Koren Shiver, RN  ? ? ? ?  ?

## 2021-12-20 DIAGNOSIS — Z9849 Cataract extraction status, unspecified eye: Secondary | ICD-10-CM | POA: Diagnosis not present

## 2021-12-20 DIAGNOSIS — Z20822 Contact with and (suspected) exposure to covid-19: Secondary | ICD-10-CM | POA: Diagnosis not present

## 2021-12-20 DIAGNOSIS — H5213 Myopia, bilateral: Secondary | ICD-10-CM | POA: Diagnosis not present

## 2021-12-20 DIAGNOSIS — H43393 Other vitreous opacities, bilateral: Secondary | ICD-10-CM | POA: Diagnosis not present

## 2021-12-20 DIAGNOSIS — H52223 Regular astigmatism, bilateral: Secondary | ICD-10-CM | POA: Diagnosis not present

## 2022-01-03 ENCOUNTER — Ambulatory Visit (INDEPENDENT_AMBULATORY_CARE_PROVIDER_SITE_OTHER): Payer: Medicare Other

## 2022-01-03 VITALS — BP 138/78 | HR 36 | Temp 97.8°F | Resp 16 | Ht 68.0 in | Wt 166.4 lb

## 2022-01-03 DIAGNOSIS — J4550 Severe persistent asthma, uncomplicated: Secondary | ICD-10-CM | POA: Diagnosis not present

## 2022-01-03 MED ORDER — OMALIZUMAB 150 MG/ML ~~LOC~~ SOSY
300.0000 mg | PREFILLED_SYRINGE | Freq: Once | SUBCUTANEOUS | Status: AC
  Administered 2022-01-03: 300 mg via SUBCUTANEOUS
  Filled 2022-01-03: qty 2

## 2022-01-03 NOTE — Progress Notes (Signed)
Diagnosis: Asthma ? ?Provider:  Praveen Mannam, MD ? ?Procedure: Injection ? ?Xolair (Omalizumab), Dose: 300 mg, Site: subcutaneous, Number of injections: 2 ? ?Discharge: Condition: Good, Destination: Home . AVS provided to patient.  ? ?Performed by:  Sayeed Weatherall E, LPN  ? ? ? ?  ?

## 2022-01-15 ENCOUNTER — Encounter: Payer: Self-pay | Admitting: Pulmonary Disease

## 2022-01-15 ENCOUNTER — Ambulatory Visit (INDEPENDENT_AMBULATORY_CARE_PROVIDER_SITE_OTHER): Payer: Medicare Other | Admitting: Pulmonary Disease

## 2022-01-15 VITALS — BP 124/70 | HR 38 | Ht 68.0 in | Wt 165.0 lb

## 2022-01-15 DIAGNOSIS — J4550 Severe persistent asthma, uncomplicated: Secondary | ICD-10-CM | POA: Diagnosis not present

## 2022-01-15 DIAGNOSIS — J029 Acute pharyngitis, unspecified: Secondary | ICD-10-CM | POA: Diagnosis not present

## 2022-01-15 MED ORDER — FLUTICASONE-SALMETEROL 230-21 MCG/ACT IN AERO
2.0000 | INHALATION_SPRAY | Freq: Two times a day (BID) | RESPIRATORY_TRACT | 11 refills | Status: DC
Start: 2022-01-15 — End: 2022-10-30

## 2022-01-15 NOTE — Progress Notes (Signed)
? ? ?Synopsis: Referred in January 2021 for Asthma. Former patient of Dr. Algie Coffer. [name] ?Subjective:  ? ?PATIENT ID: David [name] GENDER: male DOB: 1946-04-14, MRN: [id number] ? ? ?HPI ? ?Chief Complaint  ?Patient presents with  ? Follow-up  ?  Sore throat past 2wks since starting advair  ? ?David [name] is a 76 year old male never smoker with severe persistent asthma who presents for follow-up. ? ?Synopsis: ?He started omalizumab 11/29/19 with significant improvement in his symptoms and has stop using his inhaler on a daily basis. He is using Trelegy ellipta as needed, maybe a couple times per month.  ?He was diagnosed with asthma 20 years ago. He was has been on Advair, Symbicort and Trelegy in the past. He was started on Xolair in 2021 however he feels recently it has been less effective. Did not tolerate Symbicort due to cough during inhalation. Trelegy has been starting become less effective as well. He currently has coughing, wheezing and shortness of breath and awakening at night. He is currently on prednisone and symptoms will recur when he is off steroids. He has required steroid tapers six times in the last year. ? ?01/15/22 ?Since our last visit inhalers were changed to medium dose Advair HFA. We discussed transitioning to [place] on telephone encounters however he has had improved symptoms on Xolair so decided to continue this biologic. He reports he is overall doing well on Xolair and Advair. Not needing albuterol inhaler. Denies shortness of breath, wheezing or coughing. No nocturnal symptoms. ? ?Asthma Control Test ACT Total Score  ?01/15/2022 ? 9:14 AM 24  ?10/18/2021 ? 1:58 PM 8  ?01/09/2021 ?11:41 AM 23  ? ? ?Past Medical History:  ?Diagnosis Date  ? Altitude sickness   ? Asthma   ? exercise-induced; prn inhaler  ? AV block, 2nd degree   ? Bilateral inguinal hernia (BIH) 10/16/2012  ? Overview:  [facility] -Dr. Brantley Stage.  Observation for now 10/2012  ? Complete heart block ([place]) 04/04/2018   ? COVID-19   ? DVT (deep venous thrombosis) ([place]) 08/13/2018  ? Right peroneal DVT 08/13/18, post right TKA 08/10/18  ? Insomnia 07/14/2014  ? Mobitz type 1 second degree atrioventricular block   ? Nasal polyps   ? OA (osteoarthritis) 09/24/2012  ? Osteoarthritis of right knee 08/06/2018  ? Reactive airway disease 09/24/2012  ? Rotator cuff tear, right   ? impingement, AC arthrosis  ?  ? ?Allergies  ?Allergen Reactions  ? Codeine Itching  ?  ? ?Outpatient Medications Prior to Visit  ?Medication Sig Dispense Refill  ? omalizumab Arvid Right) 75 MG/0.5ML prefilled syringe Inject 75 mg into the skin every 28 (twenty-eight) days.    ? fluticasone-salmeterol (ADVAIR HFA) 230-21 MCG/ACT inhaler Inhale 2 puffs into the lungs 2 (two) times daily. 1 each 5  ? albuterol (VENTOLIN HFA) 108 (90 Base) MCG/ACT inhaler Inhale 2 puffs into the lungs every 4 (four) hours as needed for wheezing or shortness of breath. (Patient not taking: Reported on 01/15/2022) 8 g 1  ? fluticasone (FLONASE) 50 MCG/ACT nasal spray Place 1 spray into both nostrils daily. (Patient not taking: Reported on 01/15/2022) 18.2 mL 2  ? azithromycin (ZITHROMAX) 250 MG tablet Take 2 tabs today followed by 1 tab for four additional days. 6 tablet 0  ? dextromethorphan-guaiFENesin (MUCINEX DM) 30-600 MG 12hr tablet Take 1 tablet by mouth 2 (two) times daily. 60 tablet 0  ? ipratropium-albuterol (DUONEB) 0.5-2.5 (3) MG/3ML SOLN Take 3 mLs by nebulization every  6 (six) hours as needed. (Patient not taking: Reported on 01/15/2022) 360 mL 1  ? ?No facility-administered medications prior to visit.  ? ? ?Review of Systems  ?Constitutional:  Negative for chills, diaphoresis, fever, malaise/fatigue and weight loss.  ?HENT:  Negative for congestion.   ?Respiratory:  Negative for cough, hemoptysis, sputum production, shortness of breath and wheezing.   ?Cardiovascular:  Negative for chest pain, palpitations and leg swelling.  ? ? ? ?Objective:  ? ?Vitals:  ? 01/15/22 0914  ?BP:  124/70  ?Pulse: (!) 38  ?SpO2: 99%  ?Weight: 165 lb (74.8 kg)  ?Height: '5\' 8"'$  (1.727 m)  ? ?Physical Exam: ?General: Well-appearing, no acute distress ?HENT: [place], AT, no posterior pharyngeal erythema, no cobblestoning ?Eyes: EOMI, no scleral icterus ?Respiratory: Clear to auscultation bilaterally.  No crackles, wheezing or rales ?Cardiovascular: Bradycardic , RR, -M/R/G, no JVD ?Extremities:-Edema,-tenderness ?Neuro: AAO x4, CNII-XII grossly intact ?Psych: Normal mood, normal affect ? ?Data Reviewed ? ?Chest imaging: ?CTA 04/05/18 - No PE. Minimal bibasilar atelectasis. ?CXR 09/11/20 - lingular scar. Cardiomegaly. ?CXR 10/24/21 - Linular scar. Unchanged cardiomegaly ? ?PFT  ?10/18/19 ?FVC 3.73 (93%) FEV1 2.46 (84%) Ratio 62 TLC 104% DLCO 111% ?Interpretation: On bronchodilators (Spiriva). Mild obstructive defect. No significant bronchodilator response however does not preclude benefit of therapy. ? ?Labs ?IgE  ?09/29/19 -137 ?03/22/20 - 297 ?10/18/21 - 602 ?   ?Assessment & Plan:  ? ?Severe persistent asthma without complication ? ?Sore throat ? ?Discussion: ?76 year old male never smoker with severe persistent asthma on Xolair who presents for follow-up. Failed Symbicort (cough) and Trelegy (ineffective). Last exacerbation 10/2021. Currently well-controlled. Discussed clinical course and management of asthma including bronchodilator regimen and action plan for exacerbation. ?  ?Severe persistent asthma - well-controlled ?CONTINUE Advair 230-21 mcg TWO puffs TWICE a day. Use with spacer ?CONTINUE Xolair  ? ?Sore throat ?Started two weeks ago. Low suspicion for viral. Possible nasal drainage ?Recommend flonase nightly ?Reduced Advair to 1 puff TWICE a day for max 3 days ? ? ?Return in about 1 year (around 01/16/2023).  ? ?I have spent a total time of 30-minutes on the day of the appointment including chart review, data review, collecting history, coordinating care and discussing medical diagnosis and plan with the  patient/family. Past medical history, allergies, medications were reviewed. Pertinent imaging, labs and tests included in this note have been reviewed and interpreted independently by me. ? ?Rodman Pickle, MD ?[facility] ?Office: [phone] ? ? ? ?Current Outpatient Medications:  ?  omalizumab Arvid Right) 75 MG/0.5ML prefilled syringe, Inject 75 mg into the skin every 28 (twenty-eight) days., Disp: , Rfl:  ?  albuterol (VENTOLIN HFA) 108 (90 Base) MCG/ACT inhaler, Inhale 2 puffs into the lungs every 4 (four) hours as needed for wheezing or shortness of breath. (Patient not taking: Reported on 01/15/2022), Disp: 8 g, Rfl: 1 ?  fluticasone (FLONASE) 50 MCG/ACT nasal spray, Place 1 spray into both nostrils daily. (Patient not taking: Reported on 01/15/2022), Disp: 18.2 mL, Rfl: 2 ?  fluticasone-salmeterol (ADVAIR HFA) 230-21 MCG/ACT inhaler, Inhale 2 puffs into the lungs 2 (two) times daily., Disp: 1 each, Rfl: 11 ? ? ?

## 2022-01-15 NOTE — Patient Instructions (Signed)
Severe persistent asthma - well-controlled ?CONTINUE Advair 230-21 mcg TWO puffs TWICE a day. Use with spacer ?CONTINUE Xolair  ? ?Sore throat ?Started two weeks ago. Low suspicion for viral. Possible nasal drainage ?Recommend flonase nightly ?Reduced Advair to 1 puff TWICE a day for max 3 days ? ?Follow-up with me in 1 year or sooner if needed ?

## 2022-01-31 ENCOUNTER — Ambulatory Visit (INDEPENDENT_AMBULATORY_CARE_PROVIDER_SITE_OTHER): Payer: Medicare Other

## 2022-01-31 VITALS — BP 158/79 | HR 37 | Temp 97.5°F | Resp 16 | Ht 68.0 in | Wt 166.0 lb

## 2022-01-31 DIAGNOSIS — J4550 Severe persistent asthma, uncomplicated: Secondary | ICD-10-CM

## 2022-01-31 MED ORDER — OMALIZUMAB 150 MG/ML ~~LOC~~ SOSY
300.0000 mg | PREFILLED_SYRINGE | Freq: Once | SUBCUTANEOUS | Status: AC
  Administered 2022-01-31: 300 mg via SUBCUTANEOUS

## 2022-01-31 NOTE — Progress Notes (Signed)
Diagnosis: Asthma  Provider:  Marshell Garfinkel, MD  Procedure: Injection  Xolair (Omalizumab), Dose: 300 mg, Site: subcutaneous, Number of injections: 2  Discharge: Condition: Good, Destination: Home . AVS provided to patient.   Performed by:  Paul Dykes, RN

## 2022-02-28 ENCOUNTER — Ambulatory Visit (INDEPENDENT_AMBULATORY_CARE_PROVIDER_SITE_OTHER): Payer: Medicare Other

## 2022-02-28 VITALS — BP 158/84 | HR 38 | Temp 97.4°F | Resp 16 | Ht 68.0 in | Wt 165.0 lb

## 2022-02-28 DIAGNOSIS — J4550 Severe persistent asthma, uncomplicated: Secondary | ICD-10-CM

## 2022-02-28 MED ORDER — OMALIZUMAB 150 MG/ML ~~LOC~~ SOSY
300.0000 mg | PREFILLED_SYRINGE | Freq: Once | SUBCUTANEOUS | Status: AC
  Administered 2022-02-28: 300 mg via SUBCUTANEOUS

## 2022-02-28 NOTE — Progress Notes (Signed)
Diagnosis: Asthma  Provider:  Praveen Mannam, MD  Procedure: Injection  Xolair (Omalizumab), Dose: 300 mg, Site: subcutaneous, Number of injections: 2  Discharge: Condition: Good, Destination: Home . AVS provided to patient.   Performed by:  Adriana Quinby, RN       

## 2022-03-28 ENCOUNTER — Ambulatory Visit (INDEPENDENT_AMBULATORY_CARE_PROVIDER_SITE_OTHER): Payer: Medicare Other

## 2022-03-28 VITALS — BP 130/85 | HR 56 | Temp 97.3°F | Resp 18 | Ht 68.0 in | Wt 164.0 lb

## 2022-03-28 DIAGNOSIS — J4550 Severe persistent asthma, uncomplicated: Secondary | ICD-10-CM

## 2022-03-28 MED ORDER — OMALIZUMAB 150 MG/ML ~~LOC~~ SOSY
300.0000 mg | PREFILLED_SYRINGE | Freq: Once | SUBCUTANEOUS | Status: AC
  Administered 2022-03-28: 300 mg via SUBCUTANEOUS

## 2022-03-28 NOTE — Progress Notes (Signed)
Diagnosis: Asthma  Provider:  Praveen Mannam, MD  Procedure: Injection  Xolair (Omalizumab), Dose: 300 mg, Site: subcutaneous, Number of injections: 2  Discharge: Condition: Good, Destination: Home . AVS provided to patient.   Performed by:  Glema Takaki, RN       

## 2022-04-03 ENCOUNTER — Telehealth: Payer: Self-pay | Admitting: Cardiology

## 2022-04-03 DIAGNOSIS — M19012 Primary osteoarthritis, left shoulder: Secondary | ICD-10-CM | POA: Diagnosis not present

## 2022-04-03 NOTE — Telephone Encounter (Signed)
Will send to [facility], for in-office, preop appointment.

## 2022-04-03 NOTE — Telephone Encounter (Signed)
   Pre-operative Risk Assessment    Patient Name: David [name]  DOB: Jan 27, 1946 MRN: [id number]      Request for Surgical Clearance    Procedure:   Left total shoulder arthroplasty  Date of Surgery:  Clearance TBD                                 Surgeon:    Dr. Tamera Punt Surgeon's Group or Practice Name:  [facility] Phone number:  [phone] Fax number:  [phone]   Type of Clearance Requested:   - Medical    Type of Anesthesia:   Choice   Additional requests/questions:    Caller requested medical and pharmacy clearance.  Signed, Heloise Beecham   04/03/2022, 10:42 AM

## 2022-04-03 NOTE — Telephone Encounter (Signed)
   Name: [name]  DOB: [birth year]  MRN: [id number]  Primary Cardiologist: Constance Haw, MD  Chart reviewed as part of pre-operative protocol coverage. Because of Tahjir Silveria Reddinger's past medical history and time since last visit, he will require a follow-up in-office visit in order to better assess preoperative cardiovascular risk.  Pre-op covering staff: - Please schedule appointment and call patient to inform them. If patient already had an upcoming appointment within acceptable timeframe, please add "pre-op clearance" to the appointment notes so provider is aware. - Please contact requesting surgeon's office via preferred method (i.e, phone, fax) to inform them of need for appointment prior to surgery.  Just medical clearance was requested.  Elgie Collard, PA-C  04/03/2022, 11:35 AM

## 2022-04-04 ENCOUNTER — Telehealth: Payer: Self-pay | Admitting: Pulmonary Disease

## 2022-04-04 NOTE — Telephone Encounter (Signed)
Fax received from Dr. Tania Ade with [facility] to perform a LEFT TOTAL SHOULDER ARTHROPLASTY on patient.  Patient needs surgery clearance. Surgery is 05/16/2022. Patient was seen on 01/15/2022. Office protocol is a risk assessment can be sent to surgeon if patient has been seen in 60 days or less.   Sending to Dr Loanne Drilling for risk assessment or recommendations if patient needs to be seen in office prior to surgical procedure.    Patient has office visit for surgical clearance with Dr Loanne Drilling on 04/29/2022 at 4pm.

## 2022-04-04 NOTE — Telephone Encounter (Signed)
Message sent to [place], [place] scheduler to reach out to the pt with an appt for pre op.

## 2022-04-05 NOTE — Telephone Encounter (Signed)
Agree with appointment for surgical clearance.

## 2022-04-10 NOTE — Telephone Encounter (Signed)
Pt has appt 04/18/22 with Oda Kilts, PAC for pre op clearance.

## 2022-04-11 ENCOUNTER — Telehealth: Payer: Self-pay | Admitting: Pulmonary Disease

## 2022-04-15 NOTE — Progress Notes (Unsigned)
PCP:  Wayland Salinas, MD Primary Cardiologist: Will Meredith Leeds, MD Electrophysiologist: Constance Haw, MD   David [name] is a 76 y.o. male seen today for Will Meredith Leeds, MD for routine electrophysiology followup and cardiac clearance.  Since last being seen in our clinic the patient reports doing very well. He continues to exercise without difficulty, getting his HR up to 110s by device and his apple watch. Resting HR is upper 30s lower 40s..  he denies chest pain, palpitations, dyspnea, PND, orthopnea, nausea, vomiting, dizziness, syncope, edema, weight gain, or early satiety.  Past Medical History:  Diagnosis Date   Altitude sickness    Asthma    exercise-induced; prn inhaler   AV block, 2nd degree    Bilateral inguinal hernia (BIH) 10/16/2012   Overview:  [facility] -Dr. Brantley Stage.  Observation for now 10/2012   Complete heart block ([place]) 04/04/2018   COVID-19    DVT (deep venous thrombosis) ([place]) 08/13/2018   Right peroneal DVT 08/13/18, post right TKA 08/10/18   Insomnia 07/14/2014   Mobitz type 1 second degree atrioventricular block    Nasal polyps    OA (osteoarthritis) 09/24/2012   Osteoarthritis of right knee 08/06/2018   Reactive airway disease 09/24/2012   Rotator cuff tear, right    impingement, AC arthrosis   Past Surgical History:  Procedure Laterality Date   A-FLUTTER ABLATION N/A 02/24/2019   Procedure: A-FLUTTER ABLATION;  Surgeon: Constance Haw, MD;  Location: [facility];  Service: Cardiovascular;  Laterality: N/A;   BACK SURGERY     BIOPSY  07/23/2019   Procedure: BIOPSY;  Surgeon: Carol Ada, MD;  Location: [facility];  Service: Endoscopy;;   CARDIAC CATHETERIZATION     CATARACT EXTRACTION W/ INTRAOCULAR LENS IMPLANT Right    CATARACT EXTRACTION W/ INTRAOCULAR LENS IMPLANT Left    per patient about two years ago 2019   COLONOSCOPY WITH PROPOFOL N/A 07/23/2019   Procedure: COLONOSCOPY WITH PROPOFOL;  Surgeon:  Carol Ada, MD;  Location: [facility];  Service: Endoscopy;  Laterality: N/A;   EYE SURGERY     HERNIA REPAIR     INGUINAL HERNIA REPAIR Right 05/09/2020   Procedure: OPEN RIGHT INGUINAL HERNIA REPAIR;  Surgeon: [name], MD;  Location: [facility];  Service: General;  Laterality: Right;   INSERTION OF MESH Right 05/09/2020   Procedure: INSERTION OF MESH;  Surgeon: [name], MD;  Location: [facility];  Service: General;  Laterality: Right;   LEFT HEART CATH AND CORONARY ANGIOGRAPHY N/A 04/06/2018   Procedure: LEFT HEART CATH AND CORONARY ANGIOGRAPHY;  Surgeon: Jettie Booze, MD;  Location: [facility];  Service: Cardiovascular;  Laterality: N/A;   LUMBAR LAMINECTOMY  age 90s   NASAL SINUS SURGERY     POLYPECTOMY  07/23/2019   Procedure: POLYPECTOMY;  Surgeon: Carol Ada, MD;  Location: [facility];  Service: Endoscopy;;   REFRACTIVE SURGERY     ROTATOR CUFF REPAIR Right    SHOULDER ARTHROSCOPY  08/06/2011   Procedure: ARTHROSCOPY SHOULDER;  Surgeon: Cammie Sickle., MD;  Location: [facility];  Service: Orthopedics;  Laterality: Right;  with subacromial decompression, arthroscopic subscapularis repair, and rotator cuff repair   TOTAL HIP ARTHROPLASTY Right 10/02/2020   Procedure: RIGHT HIP ARTHROPLASTY ANTERIOR APPROACH;  Surgeon: Frederik Pear, MD;  Location: [facility];  Service: Orthopedics;  Laterality: Right;  3E BED   TOTAL KNEE ARTHROPLASTY Right 08/10/2018   Procedure: TOTAL KNEE ARTHROPLASTY;  Surgeon: Frederik Pear, MD;  Location: [facility];  Service: Orthopedics;  Laterality: Right;   TRIGGER FINGER RELEASE Right 06/30/2014   Procedure: RELEASE A-1 PULLEY RIGHT THUMB;  Surgeon: Daryll Brod, MD;  Location: [facility];  Service: Orthopedics;  Laterality: Right;  ANESTHESIA:  IV REGIONAL FAB    Current Outpatient Medications  Medication Sig Dispense Refill   albuterol (VENTOLIN HFA) 108 (90 Base) MCG/ACT inhaler Inhale 2 puffs into the  lungs every 4 (four) hours as needed for wheezing or shortness of breath. 8 g 1   fluticasone (FLONASE) 50 MCG/ACT nasal spray Place 1 spray into both nostrils daily. 18.2 mL 2   fluticasone-salmeterol (ADVAIR HFA) 230-21 MCG/ACT inhaler Inhale 2 puffs into the lungs 2 (two) times daily. 1 each 11   omalizumab (XOLAIR) 75 MG/0.5ML prefilled syringe Inject 75 mg into the skin every 28 (twenty-eight) days.     No current facility-administered medications for this visit.    Allergies  Allergen Reactions   Codeine Itching    Social History   Socioeconomic History   Marital status: Married    Spouse name: Not on file   Number of children: Not on file   Years of education: Not on file   Highest education level: Not on file  Occupational History   Not on file  Tobacco Use   Smoking status: Never   Smokeless tobacco: Never  Vaping Use   Vaping Use: Never used  Substance and Sexual Activity   Alcohol use: Yes    Comment: occ. beer   Drug use: No   Sexual activity: Not on file  Other Topics Concern   Not on file  Social History Narrative   Not on file   Social Determinants of Health   Financial Resource Strain: Not on file  Food Insecurity: Not on file  Transportation Needs: Not on file  Physical Activity: Not on file  Stress: Not on file  Social Connections: Not on file  Intimate Partner Violence: Not on file     Review of Systems: All other systems reviewed and are otherwise negative except as noted above.  Physical Exam: Vitals:   04/18/22 0858  BP: 138/84  Pulse: (!) 41  SpO2: 97%  Weight: 164 lb (74.4 kg)  Height: '5\' 8"'$  (1.727 m)    GEN- The patient is well appearing, alert and oriented x 3 today.   HEENT: normocephalic, atraumatic; sclera clear, conjunctiva pink; hearing intact; oropharynx clear; neck supple, no JVP Lymph- no cervical lymphadenopathy Lungs- Clear to ausculation bilaterally, normal work of breathing.  No wheezes, rales, rhonchi Heart-  Regular rate and rhythm, no murmurs, rubs or gallops, PMI not laterally displaced GI- soft, non-tender, non-distended, bowel sounds present, no hepatosplenomegaly Extremities- no clubbing, cyanosis, or edema; DP/PT/radial pulses 2+ bilaterally MS- no significant deformity or atrophy Skin- warm and dry, no rash or lesion Psych- euthymic mood, full affect Neuro- strength and sensation are intact  EKG is ordered. Personal review of EKG from today shows AV dissociation/CHB at 41 bpm with narrow QRS  Additional studies reviewed include: Previous EP office notes.   Assessment and Plan:  1.  Advanced AV block With 2:1 and CHB in the past QRS complex is narrow.  ETT 09/2020 with 1:1 conduction at faster HRs.  CHB today. Gets HR up to 110s with exercise. No indication for pacemaker at this time given lack of symptoms and narrow QRS.    2.  Typical appearing atrial flutter:  Status post ablation  02/24/2019.   He has not had recurrence and is off of anticoagulation for a CHA2DS2-VASc of 1.  3. Cardiac Clearance for Shoulder arthroplasty Overall he is at low risk, somewhat complicated by his CHB. He is able to get his HR up into 110s with exercise.  Pt has no symptoms of CP or SOB and is very active.  Ok to proceed without further cardiac testing. Procedure needs to take place in a hospital setting given his complete heart block.    Follow up with Dr. Curt Bears in 12 months   Shirley Friar, [place]  04/18/22 9:35 AM

## 2022-04-17 NOTE — Telephone Encounter (Signed)
Pt has infusion appt 8/23 at 10:30.

## 2022-04-18 ENCOUNTER — Ambulatory Visit (INDEPENDENT_AMBULATORY_CARE_PROVIDER_SITE_OTHER): Payer: Medicare Other | Admitting: Student

## 2022-04-18 ENCOUNTER — Encounter: Payer: Self-pay | Admitting: Student

## 2022-04-18 VITALS — BP 138/84 | HR 41 | Ht 68.0 in | Wt 164.0 lb

## 2022-04-18 DIAGNOSIS — Z01818 Encounter for other preprocedural examination: Secondary | ICD-10-CM

## 2022-04-18 DIAGNOSIS — I483 Typical atrial flutter: Secondary | ICD-10-CM | POA: Diagnosis not present

## 2022-04-18 DIAGNOSIS — I442 Atrioventricular block, complete: Secondary | ICD-10-CM

## 2022-04-18 DIAGNOSIS — I441 Atrioventricular block, second degree: Secondary | ICD-10-CM

## 2022-04-22 ENCOUNTER — Other Ambulatory Visit: Payer: Self-pay | Admitting: Orthopedic Surgery

## 2022-04-24 ENCOUNTER — Ambulatory Visit (INDEPENDENT_AMBULATORY_CARE_PROVIDER_SITE_OTHER): Payer: Medicare Other

## 2022-04-24 VITALS — BP 148/83 | HR 36 | Temp 97.6°F | Resp 16 | Ht 68.0 in | Wt 165.4 lb

## 2022-04-24 DIAGNOSIS — J4550 Severe persistent asthma, uncomplicated: Secondary | ICD-10-CM | POA: Diagnosis not present

## 2022-04-24 MED ORDER — OMALIZUMAB 150 MG/ML ~~LOC~~ SOSY
300.0000 mg | PREFILLED_SYRINGE | Freq: Once | SUBCUTANEOUS | Status: AC
  Administered 2022-04-24: 300 mg via SUBCUTANEOUS
  Filled 2022-04-24: qty 2

## 2022-04-24 NOTE — Progress Notes (Signed)
Diagnosis: Asthma  Provider:  Praveen Mannam MD  Procedure: Injection  Xolair (Omalizumab), Dose: 300 mg, Site: subcutaneous, Number of injections: 2   Discharge: Condition: Good, Destination: Home . AVS provided to patient.   Performed by:  Amiracle Neises E Sarvesh Meddaugh, LPN       

## 2022-04-25 ENCOUNTER — Ambulatory Visit: Payer: Medicare Other

## 2022-04-29 ENCOUNTER — Ambulatory Visit (INDEPENDENT_AMBULATORY_CARE_PROVIDER_SITE_OTHER): Payer: Medicare Other | Admitting: Pulmonary Disease

## 2022-04-29 ENCOUNTER — Encounter: Payer: Self-pay | Admitting: Pulmonary Disease

## 2022-04-29 VITALS — BP 116/70 | HR 40 | Ht 68.0 in | Wt 166.8 lb

## 2022-04-29 DIAGNOSIS — J4551 Severe persistent asthma with (acute) exacerbation: Secondary | ICD-10-CM

## 2022-04-29 MED ORDER — METHYLPREDNISOLONE ACETATE 80 MG/ML IJ SUSP
80.0000 mg | Freq: Once | INTRAMUSCULAR | Status: AC
  Administered 2022-04-29: 80 mg via INTRAMUSCULAR

## 2022-04-29 NOTE — Patient Instructions (Addendum)
  Severe persistent asthma - overall well-controlled, mild cough CONTINUE Advair 230-21 mcg 1-2 puffs TWICE a day pending sore throat. Use with spacer CONTINUE Xolair  Administer steroid shot today for mild cough to optimize asthma symptoms  Peri-operative Assessment of Pulmonary Risk for Non-Thoracic Surgery:  For David [name], risk of perioperative pulmonary complications is increased by:  [x ] Age greater than 81 years  '[ ]'$  COPD  '[ ]'$  Serum albumin <3.5  '[ ]'$  Smoking  '[ ]'$  Obstructive sleep apnea  '[ ]'$  NYHA Class II Pulmonary Hypertension  Respiratory complications generally occur in 1% of ASA Class I patients, 5% of ASA Class II and 10% of ASA Class III-IV patients These complications rarely result in mortality and include postoperative pneumonia, atelectasis, pulmonary embolism, ARDS and increased time requiring postoperative mechanical ventilation.  ARISCAT - Low risk with 1.6% risk of in-hospital post-op pulmonary complications (composite including respiratory failure, respiratory infection, pleural effusion, atelectasis, pneumothorax, bronchospasm, aspiration pneumonitis)  Overall, I recommend proceeding with the surgery if the risk for respiratory complications are outweighed by the potential benefits. This will need to be discussed between the patient and surgeon.  To reduce risks of respiratory complications, I recommend: --Pre- and post-operative incentive spirometry performed frequently while awake --Avoiding use of pancuronium during anesthesia.  Follow-up with me in May 2024. Recall already in.

## 2022-04-29 NOTE — Progress Notes (Signed)
Synopsis: Referred in January 2021 for Asthma. Former patient of Dr. Algie Coffer. David [name] Subjective:   PATIENT ID: David [name] GENDER: male DOB: 1945-12-06, MRN: [id number]   HPI  Chief Complaint  Patient presents with   Follow-up    Surgical clearance Left shoulder   [name] is a 76 year old male never smoker with severe persistent asthma who presents for follow-up.  Synopsis: He started omalizumab 11/29/19 with significant improvement in his symptoms and has stop using his inhaler on a daily basis. He is using Trelegy ellipta as needed, maybe a couple times per month.  He was diagnosed with asthma 20 years ago. He was has been on Advair, Symbicort and Trelegy in the past. He was started on Xolair in 2021 however he feels recently it has been less effective. Did not tolerate Symbicort due to cough during inhalation. Trelegy has been starting become less effective as well. He currently has coughing, wheezing and shortness of breath and awakening at night. He is currently on prednisone and symptoms will recur when he is off steroids. He has required steroid tapers six times in the last year.  01/15/22 Since our last visit inhalers were changed to medium dose Advair HFA. We discussed transitioning to [place] on telephone encounters however he has had improved symptoms on Xolair so decided to continue this biologic. He reports he is overall doing well on Xolair and Advair. Not needing albuterol inhaler. Denies shortness of breath, wheezing or coughing. No nocturnal symptoms.  04/29/22 He is planning for left total shoulder arthroplasty on 05/16/2022. Last exacerbation in 10/2021. Currently on Advair ONE puff TWICE a day due to a cough/sore throat. Reports symptoms are overall well controlled. No nocturnal symptoms. On Xolair. Does report mild cough that usually leads to worsening respiratory symptoms if not addressed.  Asthma Control Test ACT Total Score  04/29/2022  4:09 PM 20   01/15/2022  9:14 AM 24  10/18/2021  1:58 PM 8    Past Medical History:  Diagnosis Date   [place] sickness    Asthma    exercise-induced; prn inhaler   AV block, 2nd degree    Bilateral inguinal hernia (BIH) 10/16/2012   Overview:  [facility] -Dr. Brantley Stage.  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 8 g 1   fluticasone (FLONASE) 50 MCG/ACT nasal spray Place 1 spray into both nostrils daily. 18.2 mL 2   fluticasone-salmeterol (ADVAIR HFA) 230-21 MCG/ACT inhaler Inhale 2 puffs into the lungs 2 (two) times daily. 1 each 11   omalizumab (XOLAIR) 75 MG/0.5ML prefilled syringe Inject 75 mg into the skin every 28 (twenty-eight) days.     No facility-administered medications prior to visit.    Review of Systems  Constitutional:  Negative for chills, diaphoresis, fever, malaise/fatigue and weight loss.  HENT:  Negative for congestion.   Respiratory:  Negative for cough, hemoptysis, sputum production, shortness of breath and wheezing.   Cardiovascular:  Negative for chest pain, palpitations and leg swelling.      Objective:   Vitals:   04/29/22 1608  BP: 116/70  Pulse: (!) 40  SpO2: 97%  Weight: 166 lb 12.8 oz (75.7 kg)  Height: '5\' 8"'$  (1.727 m)   Physical Exam: General: Well-appearing, no  acute distress HENT: [place], AT Eyes: EOMI, no scleral icterus Respiratory: Clear to auscultation bilaterally.  No crackles, wheezing or rales Cardiovascular: Bradycardic (baseline), RR, -M/R/G, no JVD Extremities:-Edema,-tenderness Neuro: AAO x4, CNII-XII grossly intact Psych: Normal mood, normal affect  Data Reviewed  Chest imaging: CTA 04/05/18 - No PE. Minimal bibasilar atelectasis. CXR 09/11/20 - lingular scar. Cardiomegaly. CXR 10/24/21 - Linular scar. Unchanged cardiomegaly  PFT  10/18/19 FVC 3.73 (93%) FEV1 2.46 (84%) Ratio 62 TLC 104% DLCO 111% Interpretation: On bronchodilators (Spiriva). Mild obstructive defect. No significant bronchodilator response however does not preclude benefit of therapy.  Labs IgE  09/29/19 -137 03/22/20 - 297 10/18/21 - 602    Assessment & Plan:   Severe persistent asthma with acute exacerbation - Plan: methylPREDNISolone acetate (DEPO-MEDROL) injection 80 mg  Discussion: 76 year old male never smoker with severe persistent asthma on Xolair who presents for follow-up. Overall well-controlled with mild cough on reduced Advair dosing. Treat for mild exacerbation today. Prefers injection over prednisone taper. Last exacerbation 10/2021 and 04/2022. Discussed clinical course and management of asthma including bronchodilator regimen and action plan for exacerbation.  Previously failed Symbicort (cough) and Trelegy (ineffective).   Severe persistent asthma - overall well-controlled, mild cough CONTINUE Advair 230-21 mcg 1-2 puffs TWICE a day pending sore throat. Use with spacer CONTINUE Xolair  Administer steroid shot today for mild cough to optimize asthma symptoms  Peri-operative Assessment of Pulmonary Risk for Non-Thoracic Surgery:  For Mr. Anne, risk of perioperative pulmonary complications is increased by:  [x ] Age greater than 54 years  '[ ]'$  COPD  '[ ]'$  Serum albumin <3.5  '[ ]'$  Smoking  '[ ]'$  Obstructive sleep apnea  '[ ]'$  NYHA Class II Pulmonary  Hypertension  Respiratory complications generally occur in 1% of ASA Class I patients, 5% of ASA Class II and 10% of ASA Class III-IV patients These complications rarely result in mortality and include postoperative pneumonia, atelectasis, pulmonary embolism, ARDS and increased time requiring postoperative mechanical ventilation.  ARISCAT - Low risk with 1.6% risk of in-hospital post-op pulmonary complications (composite including respiratory failure, respiratory infection, pleural effusion, atelectasis, pneumothorax, bronchospasm, aspiration pneumonitis)  Overall, I recommend proceeding with the surgery if the risk for respiratory complications are outweighed by the potential benefits. This will need to be discussed between the patient and surgeon.  To reduce risks of respiratory complications, I recommend: --Pre- and post-operative incentive spirometry performed frequently while awake --Avoiding use of pancuronium during anesthesia.  I have discussed the risk factors and recommendations above with the patient.  Return in about 9 months (around 01/28/2023).   I have spent a total time of 30-minutes on the day of the appointment including chart review, data review, collecting history, coordinating care and discussing medical diagnosis and plan with the patient/family. Past medical history, allergies, medications were reviewed. Pertinent imaging, labs and tests included in this note have been reviewed and interpreted independently by me.  Rodman Pickle, MD [facility] Office: [phone]  Current Outpatient Medications:    albuterol (VENTOLIN HFA) 108 (90 Base) MCG/ACT inhaler, Inhale 2 puffs into the lungs every 4 (four) hours as needed for wheezing or shortness of breath., Disp: 8 g, Rfl: 1   fluticasone (FLONASE) 50 MCG/ACT nasal spray, Place 1 spray into both nostrils daily., Disp: 18.2 mL, Rfl: 2   fluticasone-salmeterol (ADVAIR HFA) 230-21 MCG/ACT inhaler,  Inhale 2 puffs into the lungs 2 (two) times daily., Disp: 1 each, Rfl: 11   omalizumab (XOLAIR) 75 MG/0.5ML prefilled syringe, Inject 75 mg into the skin every 28 (twenty-eight) days., Disp: , Rfl:

## 2022-05-02 DIAGNOSIS — R531 Weakness: Secondary | ICD-10-CM | POA: Diagnosis not present

## 2022-05-02 DIAGNOSIS — M25612 Stiffness of left shoulder, not elsewhere classified: Secondary | ICD-10-CM | POA: Diagnosis not present

## 2022-05-04 ENCOUNTER — Encounter: Payer: Self-pay | Admitting: Pulmonary Disease

## 2022-05-05 NOTE — Patient Instructions (Signed)
DUE TO SPACE LIMITATIONS, ONLY TWO VISITORS  (aged 76 and older) ARE ALLOWED TO COME WITH YOU AND STAY IN THE WAITING ROOM DURING YOUR PRE OP AND PROCEDURE.   **NO VISITORS ARE ALLOWED IN THE SHORT STAY AREA OR RECOVERY ROOM!!**  IF YOU WILL BE ADMITTED INTO THE HOSPITAL YOU ARE ALLOWED ONLY FOUR SUPPORT PEOPLE DURING VISITATION HOURS (7 AM -8PM)   The support person(s) must pass our screening, and use Hand sanitizing gel. Visitors GUEST BADGE MUST BE WORN VISIBLY  One adult visitor may remain with you overnight and MUST be in the room by 8 P.M.   You are not required to quarantine at this time prior to your surgery. However, you must do this: Hand Hygiene often Do NOT share personal items Notify your provider if you are in close contact with someone who has COVID or you develop fever 100.4 or greater, new onset of sneezing, cough, sore throat, shortness of breath or body aches.       Your procedure is scheduled on:    Report to [facility] Main Entrance.  Report to admitting at:     AM  +++++Call this number if you have any questions or problems the morning of surgery [phone]  Do not eat food :After Midnight the night prior to your surgery/procedure.  After Midnight you may have the following liquids until         AM / PM DAY OF SURGERY  Clear Liquid Diet Water Black Coffee (sugar ok, NO MILK/CREAM OR CREAMERS)  Tea (sugar ok, NO MILK/CREAM OR CREAMERS) regular and decaf                             Plain Jell-O (NO RED)                                           Fruit ices (not with fruit pulp, NO RED)                                     Popsicles (NO RED)                                                                  Juice: apple, WHITE grape, WHITE cranberry Sports drinks like Gatorade (NO RED)                   The day of surgery:  Drink ONE (1) Pre-Surgery Clear Ensure or G2 at        AM the morning of surgery. Drink in one sitting. Do not sip.  This drink  was given to you during your hospital pre-op appointment visit. Nothing else to drink after completing the Pre-Surgery Clear Ensure or G2.    FOLLOW BOWEL PREP AND ANY ADDITIONAL PRE OP INSTRUCTIONS YOU RECEIVED FROM YOUR SURGEON'S OFFICE!!!   Oral Hygiene is also important to reduce your risk of infection.        Remember - BRUSH YOUR TEETH THE MORNING OF SURGERY WITH YOUR REGULAR  TOOTHPASTE  Do NOT smoke after Midnight the night before surgery.  Take ONLY these medicines the morning of surgery with A SIP OF WATER:    Bring CPAP mask and tubing day of surgery.                   You may not have any metal on your body including hair pins, jewelry, and body piercing  Do not wear make-up, lotions, powders, perfumes/cologne, or deodorant  Do not wear nail polish including gel and S&S, artificial / acrylic nails, or any other type of covering on natural nails including finger and toenails. If you have artificial nails, gel coating, etc., that needs to be removed by a nail salon, Please have this removed prior to surgery. Not doing so may mean that your surgery could be cancelled or delayed if the Surgeon or anesthesia staff feels like they are unable to monitor you safely.   Do not shave 48 hours prior to surgery to avoid nicks in your skin which may contribute to postoperative infections.   Men may shave face and neck.  Contacts, Hearing Aids, dentures or bridgework may not be worn into surgery.   You may bring a small overnight bag with you on the day of surgery, only pack items that are not valuable .[facility] IS NOT RESPONSIBLE   FOR VALUABLES THAT ARE LOST OR STOLEN.   DO NOT [place]. PHARMACY WILL DISPENSE MEDICATIONS LISTED ON YOUR MEDICATION LIST TO YOU DURING YOUR ADMISSION [facility]!   Patients discharged on the day of surgery will not be allowed to drive home.  Someone NEEDS to stay with you for the first 24 hours after  anesthesia.  Special Instructions: Bring a copy of your healthcare power of attorney and living will documents the day of surgery, if you wish to have them scanned into your [facility] Records- EPIC  Please read over the following fact sheets you were given: IF YOU HAVE QUESTIONS ABOUT YOUR PRE-OP INSTRUCTIONS, PLEASE CALL [phone]  ([place])   [facility] - Preparing for Surgery Before surgery, you can play an important role.  Because skin is not sterile, your skin needs to be as free of germs as possible.  You can reduce the number of germs on your skin by washing with CHG (chlorahexidine gluconate) soap before surgery.  CHG is an antiseptic cleaner which kills germs and bonds with the skin to continue killing germs even after washing. Please DO NOT use if you have an allergy to CHG or antibacterial soaps.  If your skin becomes reddened/irritated stop using the CHG and inform your nurse when you arrive at Short Stay. Do not shave (including legs and underarms) for at least 48 hours prior to the first CHG shower.  You may shave your face/neck.  Please follow these instructions carefully:  1.  Shower with CHG Soap the night before surgery and the  morning of surgery.  2.  If you choose to wash your hair, wash your hair first as usual with your normal  shampoo.  3.  After you shampoo, rinse your hair and body thoroughly to remove the shampoo.                             4.  Use CHG as you would any other liquid soap.  You can apply chg directly to the skin and wash.  Gently with  a scrungie or clean washcloth.  5.  Apply the CHG Soap to your body ONLY FROM THE NECK DOWN.   Do not use on face/ open                           Wound or open sores. Avoid contact with eyes, ears mouth and genitals (private parts).                       Wash face,  Genitals (private parts) with your normal soap.             6.  Wash thoroughly, paying special attention to the area where your  surgery  will be  performed.  7.  Thoroughly rinse your body with warm water from the neck down.  8.  DO NOT shower/wash with your normal soap after using and rinsing off the CHG Soap.            9.  Pat yourself dry with a clean towel.            10.  Wear clean pajamas.            11.  Place clean sheets on your bed the night of your first shower and do not  sleep with pets.  ON THE DAY OF SURGERY : Do not apply any lotions/deodorants the morning of surgery.  Please wear clean clothes to the hospital/surgery center.    FAILURE TO FOLLOW THESE INSTRUCTIONS MAY RESULT IN THE CANCELLATION OF YOUR SURGERY  PATIENT SIGNATURE_________________________________  NURSE SIGNATURE__________________________________  ________________________________________________________________________     Preparing for Total Shoulder Arthroplasty ================================================================= Please follow these instructions carefully, in addition to any other special Bathing information that was explained to you at the Presurgical Appointment:  BENZOYL PEROXIDE 5% GEL: Used to kill bacteria on the skin which could cause an infection at the surgery site.   Please do not use if you have an allergy to benzoyl peroxide. If your skin becomes reddened/irritated stop using the benzoyl peroxide and inform your Doctor.   Starting two days before surgery, apply as follows:  1. Apply benzoyl peroxide gel in the morning and at night. Apply after taking a shower. If you are not taking a shower, clean entire shoulder front, back, and side, along with the armpit with a clean wet washcloth.  2. Place a quarter-sized dollop of the gel on your SHOULDER and rub in thoroughly, making sure to cover the front, back, and side of your shoulder, along with the armpit.   2 Days prior to Surgery     Tuesday 05-14-22 First Dose  _______ Morning Second Dose  _______ Night  Day Before Surgery     Wednesday  05-15-22 First Dose   ______ Morning  On the night before surgery, wash your entire body (except hair, face and private areas) with CHG Soap. THEN, rub in the LAST application of the Benzoyl Peroxide Gel on your shoulder.   3. On the Morning of Surgery wash your BODY AGAIN with CHG Soap (except hair, face and private areas)  4. DO NOT USE THE BENZOYL PEROXIDE GEL ON THE DAY OF YOUR SURGERY            Incentive Spirometer    An incentive spirometer is a tool that can help keep your lungs clear and active. This tool measures how well you are filling your lungs with each breath. Taking long deep breaths  may help reverse or decrease the chance of developing breathing (pulmonary) problems (especially infection) following: A long period of time when you are unable to move or be active. BEFORE THE PROCEDURE  If the spirometer includes an indicator to show your best effort, your nurse or respiratory therapist will set it to a desired goal. If possible, sit up straight or lean slightly forward. Try not to slouch. Hold the incentive spirometer in an upright position. INSTRUCTIONS FOR USE  Sit on the edge of your bed if possible, or sit up as far as you can in bed or on a chair. Hold the incentive spirometer in an upright position. Breathe out normally. Place the mouthpiece in your mouth and seal your lips tightly around it. Breathe in slowly and as deeply as possible, raising the piston or the ball toward the top of the column. Hold your breath for 3-5 seconds or for as long as possible. Allow the piston or ball to fall to the bottom of the column. Remove the mouthpiece from your mouth and breathe out normally. Rest for a few seconds and repeat Steps 1 through 7 at least 10 times every 1-2 hours when you are awake. Take your time and take a few normal breaths between deep breaths. The spirometer may include an indicator to show your best effort. Use the indicator as a goal to work toward during each  repetition. After each set of 10 deep breaths, practice coughing to be sure your lungs are clear. If you have an incision (the cut made at the time of surgery), support your incision when coughing by placing a pillow or rolled up towels firmly against it. Once you are able to get out of bed, walk around indoors and cough well. You may stop using the incentive spirometer when instructed by your caregiver.  RISKS AND COMPLICATIONS Take your time so you do not get dizzy or light-headed. If you are in pain, you may need to take or ask for pain medication before doing incentive spirometry. It is harder to take a deep breath if you are having pain. AFTER USE Rest and breathe slowly and easily. It can be helpful to keep track of a log of your progress. Your caregiver can provide you with a simple table to help with this. If you are using the spirometer at home, follow these instructions: [place] IF:  You are having difficultly using the spirometer. You have trouble using the spirometer as often as instructed. Your pain medication is not giving enough relief while using the spirometer. You develop fever of 100.5 F (38.1 C) or higher.                                                                                                    SEEK IMMEDIATE MEDICAL CARE IF:  You cough up bloody sputum that had not been present before. You develop fever of 102 F (38.9 C) or greater. You develop worsening pain at or near the incision site. MAKE SURE YOU:  Understand these instructions. Will watch your condition. Will get help right  away if you are not doing well or get worse. Document Released: 12/30/2006 Document Revised: 11/11/2011 Document Reviewed: 03/02/2007 [facility] Patient Information 2014 [place], [place].

## 2022-05-05 NOTE — Progress Notes (Signed)
COVID Vaccine received:  '[]'$  No '[x]'$  Yes Date of any COVID positive Test in last 90 days:  none  PCP - Elenor Quinones, MD Cardiologist - Allegra Lai, MD    Cardiac clearance- Oda [email or web address]  04-18-22  Office note Pulmonolgy-  Chi Rodman Pickle, MD     Clearance in 04-29-22 office note  Chest x-ray - 05-08-22  per Dr.Chandler's order EKG -  04-18-22  Epic Stress Test - n/a ECHO - n/a Cardiac Cath - 2019  By Dr. Irish Lack  Pacemaker/ICD device     '[x]'$  N/A Spinal Cord Stimulator:'[x]'$  No '[]'$  Yes      (Other Implants:   History of Sleep Apnea? '[x]'$  No '[]'$  Yes   Sleep Study Date:   CPAP used?- '[x]'$  No '[]'$  Yes  (Instruct to bring their mask & Tubing)  Does the patient monitor blood sugar? '[]'$  No '[]'$  Yes  '[x]'$  N/A  Blood Thinner Instructions:  n/a Aspirin Instructions:  none Last Dose:  ERAS Protocol Ordered: '[]'$  No  '[x]'$  Yes PRE-SURGERY '[x]'$  ENSURE  '[]'$  G2   Comments: Patient was educated on the use of the Benzoyl peroxide gel prior to his surgery. He voiced understanding of the procedure.   Activity level: Patient can climb a flight of stairs without difficulty; '[x]'$  No CP  '[x]'$  No SOB,  Anesthesia review: severe asthma, A.flutter (ablation 02-24-2019), Hx DVT   Patient denies shortness of breath, fever, cough and chest pain at PAT appointment.  Patient verbalized understanding and agreement to the Pre-Surgical Instructions that were given to them at this PAT appointment. Patient was also educated of the need to review these PAT instructions again prior to his/her surgery.I reviewed the appropriate phone numbers to call if they have any and questions or concerns.

## 2022-05-07 NOTE — Telephone Encounter (Signed)
OV notes and clearance form have been faxed back to [facility]. Nothing further needed at this time.  

## 2022-05-08 ENCOUNTER — Encounter (HOSPITAL_COMMUNITY)
Admission: RE | Admit: 2022-05-08 | Discharge: 2022-05-08 | Disposition: A | Discharge status: Home / Self Care | Payer: Medicare Other | Source: Ambulatory Visit | Attending: Orthopedic Surgery | Admitting: Orthopedic Surgery

## 2022-05-08 ENCOUNTER — Ambulatory Visit (HOSPITAL_COMMUNITY)
Admission: RE | Admit: 2022-05-08 | Discharge: 2022-05-08 | Disposition: A | Discharge status: Home / Self Care | Payer: Medicare Other | Source: Ambulatory Visit | Attending: Orthopedic Surgery | Admitting: Orthopedic Surgery

## 2022-05-08 ENCOUNTER — Encounter (HOSPITAL_COMMUNITY): Payer: Self-pay

## 2022-05-08 ENCOUNTER — Other Ambulatory Visit: Payer: Self-pay

## 2022-05-08 VITALS — BP 129/87 | HR 50 | Temp 98.4°F | Resp 16 | Ht 68.0 in | Wt 164.0 lb

## 2022-05-08 DIAGNOSIS — M19012 Primary osteoarthritis, left shoulder: Secondary | ICD-10-CM | POA: Insufficient documentation

## 2022-05-08 DIAGNOSIS — I441 Atrioventricular block, second degree: Secondary | ICD-10-CM | POA: Insufficient documentation

## 2022-05-08 DIAGNOSIS — I2510 Atherosclerotic heart disease of native coronary artery without angina pectoris: Secondary | ICD-10-CM | POA: Insufficient documentation

## 2022-05-08 DIAGNOSIS — Z01812 Encounter for preprocedural laboratory examination: Secondary | ICD-10-CM | POA: Insufficient documentation

## 2022-05-08 DIAGNOSIS — Z01818 Encounter for other preprocedural examination: Secondary | ICD-10-CM

## 2022-05-08 DIAGNOSIS — Z86718 Personal history of other venous thrombosis and embolism: Secondary | ICD-10-CM | POA: Insufficient documentation

## 2022-05-08 DIAGNOSIS — J45990 Exercise induced bronchospasm: Secondary | ICD-10-CM | POA: Diagnosis not present

## 2022-05-08 LAB — SURGICAL PCR SCREEN
MRSA, PCR: NEGATIVE
Staphylococcus aureus: NEGATIVE

## 2022-05-08 LAB — CBC
HCT: 46.3 % (ref 39.0–52.0)
Hemoglobin: 15.2 g/dL (ref 13.0–17.0)
MCH: 31.8 pg (ref 26.0–34.0)
MCHC: 32.8 g/dL (ref 30.0–36.0)
MCV: 96.9 fL (ref 80.0–100.0)
Platelets: 227 10*3/uL (ref 150–400)
RBC: 4.78 MIL/uL (ref 4.22–5.81)
RDW: 14.1 % (ref 11.5–15.5)
WBC: 6.2 10*3/uL (ref 4.0–10.5)
nRBC: 0 % (ref 0.0–0.2)

## 2022-05-08 LAB — BASIC METABOLIC PANEL
Anion gap: 8 (ref 5–15)
BUN: 26 mg/dL — ABNORMAL HIGH (ref 8–23)
CO2: 24 mmol/L (ref 22–32)
Calcium: 9.1 mg/dL (ref 8.9–10.3)
Chloride: 107 mmol/L (ref 98–111)
Creatinine, Ser: 1.11 mg/dL (ref 0.61–1.24)
GFR, Estimated: >60 mL/min (ref >60)
Glucose, Bld: 109 mg/dL — ABNORMAL HIGH (ref 70–99)
Potassium: 4.2 mmol/L (ref 3.5–5.1)
Sodium: 139 mmol/L (ref 135–145)

## 2022-05-09 NOTE — Anesthesia Preprocedure Evaluation (Addendum)
Anesthesia Evaluation  Patient identified by MRN, date of birth, ID band Patient awake    Reviewed: Allergy & Precautions, NPO status , Patient's Chart, lab work & pertinent test results, reviewed documented beta blocker date and time   Airway Mallampati: II  TM Distance: >3 FB Neck ROM: Full    Dental no notable dental hx. (+) Teeth Intact   Pulmonary asthma ,    Pulmonary exam normal        Cardiovascular + DVT  + dysrhythmias Atrial Fibrillation and Supra Ventricular Tachycardia  Rhythm:Regular Rate:Bradycardia  Hx/o complete heart block S/P atrial flutter ablation  EKG 05/16/22 Complete heart block with junctional rhythm rate 43/min, unchanged from previous tracing   Neuro/Psych negative neurological ROS  negative psych ROS   GI/Hepatic negative GI ROS, Neg liver ROS,   Endo/Other  negative endocrine ROS  Renal/GU negative Renal ROS  negative genitourinary   Musculoskeletal  (+) Arthritis , Osteoarthritis,  OA left shoulder   Abdominal   Peds  Hematology negative hematology ROS (+)   Anesthesia Other Findings   Reproductive/Obstetrics                           Anesthesia Physical Anesthesia Plan  ASA: 2  Anesthesia Plan: General   Post-op Pain Management: Regional block* and Minimal or no pain anticipated   Induction: Intravenous  PONV Risk Score and Plan: 3 and Treatment may vary due to age or medical condition and Ondansetron  Airway Management Planned: Oral ETT  Additional Equipment: None  Intra-op Plan:   Post-operative Plan: Extubation in OR  Informed Consent: I have reviewed the patients History and Physical, chart, labs and discussed the procedure including the risks, benefits and alternatives for the proposed anesthesia with the patient or authorized representative who has indicated his/her understanding and acceptance.     Dental advisory given  Plan  Discussed with: CRNA and Anesthesiologist  Anesthesia Plan Comments: (See PAT note 05/08/2022)      Anesthesia Quick Evaluation

## 2022-05-09 NOTE — Progress Notes (Signed)
Anesthesia Chart Review  Case: [id number] Date/Time: 05/16/22 6195   Procedure: TOTAL SHOULDER ARTHROPLASTY (Left: Shoulder)   Anesthesia type: Choice   Pre-op diagnosis: LEFT SHOUDLER END STAGE OSTEOARTHRITIS   Location: [facility] 07 / [facility]   Surgeons: Tania Ade, MD       DISCUSSION:76 y.o.e never smoker with h/o Asthma, CHB, Mobitz Type 1, DVT, left shoulder OA scheduled for above procedure 05/16/2022 with Dr. Tania Ade.   Pt seen by pulmonology 04/29/2022. Per OV note,  "Respiratory complications generally occur in 1% of ASA Class I patients, 5% of ASA Class II and 10% of ASA Class III-IV patients These complications rarely result in mortality and include postoperative pneumonia, atelectasis, pulmonary embolism, ARDS and increased time requiring postoperative mechanical ventilation.   ARISCAT - Low risk with 1.6% risk of in-hospital post-op pulmonary complications (composite including respiratory failure, respiratory infection, pleural effusion, atelectasis, pneumothorax, bronchospasm, aspiration pneumonitis)   Overall, I recommend proceeding with the surgery if the risk for respiratory complications are outweighed by the potential benefits. This will need to be discussed between the patient and surgeon.   To reduce risks of respiratory complications, I recommend: --Pre- and post-operative incentive spirometry performed frequently while awake --Avoiding use of pancuronium during anesthesia."  Pt with CHB, per cardiology no indication for pacemaker given lack of symptoms and narrow QRS. Seen by cardiology for preoperative evaluation 04/18/2022. Per OV note, "Overall he is at low risk, somewhat complicated by his CHB. He is able to get his HR up into 110s with exercise.  Pt has no symptoms of CP or SOB and is very active.  Ok to proceed without further cardiac testing. Procedure needs to take place in a hospital setting given his complete heart block."  Anticipate pt can proceed  with planned procedure barring acute status change.   VS: BP 129/87   Pulse (!) 50 Comment: patient has known bradycardia and sees Dr. Curt Bears  Temp 36.9 C (Oral)   Resp 16   Ht '5\' 8"'$  (1.727 m)   Wt 74.4 kg   SpO2 98%   BMI 24.94 kg/m   PROVIDERS: Ryter-Brown, Shyrl Numbers, MD is PCP   Cardiologist - Allegra Lai, MD  Margaretha Seeds, MD is Pulmonologist  LABS: Labs reviewed: Acceptable for surgery. (all labs ordered are listed, but only abnormal results are displayed)  Labs Reviewed  BASIC METABOLIC PANEL - Abnormal; Notable for the following components:      Result Value   Glucose, Bld 109 (*)    BUN 26 (*)    All other components within normal limits  SURGICAL PCR SCREEN  CBC     IMAGES:   EKG:   CV: Cardiac Cath 04/06/2018 The left ventricular systolic function is normal. LV end diastolic pressure is normal. The left ventricular ejection fraction is 55-65% by visual estimate. There is no aortic valve stenosis. No significant CAD.         Echo 04/05/2018 Study Conclusions   - Left ventricle: The cavity size was normal. Wall thickness was    normal. Systolic function was normal. The estimated ejection    fraction was in the range of 55% to 60%. Wall motion was normal;    there were no regional wall motion abnormalities.  - Aortic valve: Trileaflet; mildly thickened, mildly calcified    leaflets.  - Right ventricle: The cavity size was mildly dilated. Wall    thickness was normal.  - Tricuspid valve: There was trivial regurgitation.  Past Medical  History:  Diagnosis Date   Altitude sickness    Asthma    exercise-induced; prn inhaler   AV block, 2nd degree    Bilateral inguinal hernia (BIH) 10/16/2012   Overview:  [facility] -Dr. Brantley Stage.  Observation for now 10/2012   Complete heart block ([place]) 04/04/2018   COVID-19    DVT (deep venous thrombosis) ([place]) 08/13/2018   Right peroneal DVT 08/13/18, post right TKA 08/10/18   Insomnia  07/14/2014   Mobitz type 1 second degree atrioventricular block    Nasal polyps    OA (osteoarthritis) 09/24/2012   Osteoarthritis of right knee 08/06/2018   Reactive airway disease 09/24/2012   Rotator cuff tear, right    impingement, AC arthrosis    Past Surgical History:  Procedure Laterality Date   A-FLUTTER ABLATION N/A 02/24/2019   Procedure: A-FLUTTER ABLATION;  Surgeon: Constance Haw, MD;  Location: [place] [facility];  Service: Cardiovascular;  Laterality: N/A;   BACK SURGERY     BIOPSY  07/23/2019   Procedure: BIOPSY;  Surgeon: Carol Ada, MD;  [facility];  Service: Endoscopy;;   CARDIAC CATHETERIZATION     CARPAL TUNNEL RELEASE Bilateral 2021   CATARACT EXTRACTION W/ INTRAOCULAR LENS IMPLANT Right    CATARACT EXTRACTION W/ INTRAOCULAR LENS IMPLANT Left    per patient about two years ago 2019   COLONOSCOPY WITH PROPOFOL N/A 07/23/2019   Procedure: COLONOSCOPY WITH PROPOFOL;  Surgeon: Carol Ada, MD;  [facility];  Service: Endoscopy;  Laterality: N/A;   EYE SURGERY     HERNIA REPAIR     INGUINAL HERNIA REPAIR Right 05/09/2020   Procedure: OPEN RIGHT INGUINAL HERNIA REPAIR;  Surgeon: [name], MD;  Location: [place];  Service: General;  Laterality: Right;   INSERTION OF MESH Right 05/09/2020   Procedure: INSERTION OF MESH;  Surgeon: [name], MD;  Location: [place];  Service: General;  Laterality: Right;   LEFT HEART CATH AND CORONARY ANGIOGRAPHY N/A 04/06/2018   Procedure: LEFT HEART CATH AND CORONARY ANGIOGRAPHY;  Surgeon: Jettie Booze, MD;  Location: [place] [facility];  Service: Cardiovascular;  Laterality: N/A;   LUMBAR LAMINECTOMY  age 13s   NASAL SINUS SURGERY     POLYPECTOMY  07/23/2019   Procedure: POLYPECTOMY;  Surgeon: Carol Ada, MD;  [facility];  Service: Endoscopy;;   REFRACTIVE SURGERY     ROTATOR CUFF REPAIR Right    SHOULDER ARTHROSCOPY  08/06/2011   Procedure: ARTHROSCOPY SHOULDER;   Surgeon: Cammie Sickle., MD;  Location: [place];  Service: Orthopedics;  Laterality: Right;  with subacromial decompression, arthroscopic subscapularis repair, and rotator cuff repair   TOTAL HIP ARTHROPLASTY Right 10/02/2020   Procedure: RIGHT HIP ARTHROPLASTY ANTERIOR APPROACH;  Surgeon: Frederik Pear, MD;  [facility];  Service: Orthopedics;  Laterality: Right;  3E BED   TOTAL KNEE ARTHROPLASTY Right 08/10/2018   Procedure: TOTAL KNEE ARTHROPLASTY;  Surgeon: Frederik Pear, MD;  [facility];  Service: Orthopedics;  Laterality: Right;   TRIGGER FINGER RELEASE Right 06/30/2014   Procedure: RELEASE A-1 PULLEY RIGHT THUMB;  Surgeon: Daryll Brod, MD;  Location: [place];  Service: Orthopedics;  Laterality: Right;  ANESTHESIA:  IV REGIONAL FAB    MEDICATIONS:  albuterol (VENTOLIN HFA) 108 (90 Base) MCG/ACT inhaler   fluticasone (FLONASE) 50 MCG/ACT nasal spray   fluticasone-salmeterol (ADVAIR HFA) 230-21 MCG/ACT inhaler   No current facility-administered medications for this encounter.  Konrad Felix Ward, PA-C [facility] [phone]

## 2022-05-16 ENCOUNTER — Other Ambulatory Visit: Payer: Self-pay

## 2022-05-16 ENCOUNTER — Ambulatory Visit (HOSPITAL_COMMUNITY)
Admission: RE | Admit: 2022-05-16 | Discharge: 2022-05-16 | Disposition: A | Discharge status: Home / Self Care | Payer: Medicare Other | Source: Ambulatory Visit | Attending: Orthopedic Surgery | Admitting: Orthopedic Surgery

## 2022-05-16 ENCOUNTER — Ambulatory Visit (HOSPITAL_COMMUNITY): Payer: Medicare Other | Admitting: Physician Assistant

## 2022-05-16 ENCOUNTER — Encounter (HOSPITAL_COMMUNITY): Payer: Self-pay | Admitting: Orthopedic Surgery

## 2022-05-16 ENCOUNTER — Ambulatory Visit (HOSPITAL_BASED_OUTPATIENT_CLINIC_OR_DEPARTMENT_OTHER): Payer: Medicare Other | Admitting: Anesthesiology

## 2022-05-16 ENCOUNTER — Encounter (HOSPITAL_COMMUNITY)
Admission: RE | Disposition: A | Discharge status: Home / Self Care | Payer: Self-pay | Source: Ambulatory Visit | Attending: Orthopedic Surgery

## 2022-05-16 DIAGNOSIS — I4891 Unspecified atrial fibrillation: Secondary | ICD-10-CM | POA: Diagnosis not present

## 2022-05-16 DIAGNOSIS — Z86718 Personal history of other venous thrombosis and embolism: Secondary | ICD-10-CM | POA: Insufficient documentation

## 2022-05-16 DIAGNOSIS — M19012 Primary osteoarthritis, left shoulder: Secondary | ICD-10-CM

## 2022-05-16 DIAGNOSIS — Z96612 Presence of left artificial shoulder joint: Secondary | ICD-10-CM | POA: Diagnosis not present

## 2022-05-16 DIAGNOSIS — J45909 Unspecified asthma, uncomplicated: Secondary | ICD-10-CM | POA: Diagnosis not present

## 2022-05-16 DIAGNOSIS — I471 Supraventricular tachycardia: Secondary | ICD-10-CM | POA: Diagnosis not present

## 2022-05-16 DIAGNOSIS — I442 Atrioventricular block, complete: Secondary | ICD-10-CM | POA: Diagnosis not present

## 2022-05-16 DIAGNOSIS — G8918 Other acute postprocedural pain: Secondary | ICD-10-CM | POA: Diagnosis not present

## 2022-05-16 HISTORY — PX: TOTAL SHOULDER ARTHROPLASTY: SHX126

## 2022-05-16 SURGERY — ARTHROPLASTY, SHOULDER, TOTAL
Anesthesia: General | Site: Shoulder | Laterality: Left

## 2022-05-16 MED ORDER — CHLORHEXIDINE GLUCONATE 0.12 % MT SOLN
15.0000 mL | Freq: Once | OROMUCOSAL | Status: AC
  Administered 2022-05-16: 15 mL via OROMUCOSAL

## 2022-05-16 MED ORDER — PHENYLEPHRINE HCL-NACL 20-0.9 MG/250ML-% IV SOLN
INTRAVENOUS | Status: AC
  Filled 2022-05-16: qty 250

## 2022-05-16 MED ORDER — ONDANSETRON HCL 4 MG/2ML IJ SOLN
INTRAMUSCULAR | Status: DC | PRN
  Administered 2022-05-16: 4 mg via INTRAVENOUS

## 2022-05-16 MED ORDER — EPHEDRINE SULFATE-NACL 50-0.9 MG/10ML-% IV SOSY
PREFILLED_SYRINGE | INTRAVENOUS | Status: DC | PRN
  Administered 2022-05-16: 5 mg via INTRAVENOUS
  Administered 2022-05-16 (×2): 10 mg via INTRAVENOUS

## 2022-05-16 MED ORDER — EPHEDRINE 5 MG/ML INJ
INTRAVENOUS | Status: AC
  Filled 2022-05-16: qty 10

## 2022-05-16 MED ORDER — MIDAZOLAM HCL 2 MG/2ML IJ SOLN
2.0000 mg | INTRAMUSCULAR | Status: DC
  Filled 2022-05-16: qty 2

## 2022-05-16 MED ORDER — OXYCODONE HCL 5 MG PO TABS: 5.0000 mg | ORAL_TABLET | Freq: Once | ORAL | Status: DC | PRN

## 2022-05-16 MED ORDER — SUGAMMADEX SODIUM 200 MG/2ML IV SOLN
INTRAVENOUS | Status: DC | PRN
  Administered 2022-05-16: 200 mg via INTRAVENOUS

## 2022-05-16 MED ORDER — PROPOFOL 10 MG/ML IV BOLUS
INTRAVENOUS | Status: AC
  Filled 2022-05-16: qty 20

## 2022-05-16 MED ORDER — OXYCODONE-ACETAMINOPHEN 5-325 MG PO TABS: 1.0000 | ORAL_TABLET | ORAL | 0 refills | Status: DC | PRN

## 2022-05-16 MED ORDER — CEFAZOLIN SODIUM-DEXTROSE 2-4 GM/100ML-% IV SOLN
2.0000 g | INTRAVENOUS | Status: AC
  Administered 2022-05-16: 2 g via INTRAVENOUS
  Filled 2022-05-16: qty 100

## 2022-05-16 MED ORDER — OXYCODONE HCL 5 MG/5ML PO SOLN: 5.0000 mg | Freq: Once | ORAL | Status: DC | PRN

## 2022-05-16 MED ORDER — HYDROMORPHONE HCL 1 MG/ML IJ SOLN: 0.2500 mg | INTRAMUSCULAR | Status: DC | PRN

## 2022-05-16 MED ORDER — ONDANSETRON HCL 4 MG/2ML IJ SOLN: 4.0000 mg | Freq: Once | INTRAMUSCULAR | Status: DC | PRN

## 2022-05-16 MED ORDER — ROCURONIUM BROMIDE 10 MG/ML (PF) SYRINGE
PREFILLED_SYRINGE | INTRAVENOUS | Status: AC
  Filled 2022-05-16: qty 10

## 2022-05-16 MED ORDER — HEMOSTATIC AGENTS (NO CHARGE) OPTIME
TOPICAL | Status: DC | PRN
  Administered 2022-05-16: 1 via TOPICAL

## 2022-05-16 MED ORDER — LIDOCAINE HCL (PF) 2 % IJ SOLN
INTRAMUSCULAR | Status: AC
  Filled 2022-05-16: qty 5

## 2022-05-16 MED ORDER — PROPOFOL 10 MG/ML IV BOLUS
INTRAVENOUS | Status: DC | PRN
  Administered 2022-05-16: 150 mg via INTRAVENOUS

## 2022-05-16 MED ORDER — ONDANSETRON HCL 4 MG/2ML IJ SOLN
INTRAMUSCULAR | Status: AC
  Filled 2022-05-16: qty 2

## 2022-05-16 MED ORDER — SODIUM CHLORIDE 0.9 % IR SOLN
Status: DC | PRN
  Administered 2022-05-16: 1000 mL

## 2022-05-16 MED ORDER — LACTATED RINGERS IV SOLN: INTRAVENOUS | Status: DC

## 2022-05-16 MED ORDER — BUPIVACAINE HCL (PF) 0.5 % IJ SOLN
INTRAMUSCULAR | Status: DC | PRN
  Administered 2022-05-16: 20 mL via PERINEURAL

## 2022-05-16 MED ORDER — DEXAMETHASONE SODIUM PHOSPHATE 10 MG/ML IJ SOLN
INTRAMUSCULAR | Status: AC
  Filled 2022-05-16: qty 1

## 2022-05-16 MED ORDER — FENTANYL CITRATE PF 50 MCG/ML IJ SOSY
100.0000 ug | PREFILLED_SYRINGE | INTRAMUSCULAR | Status: DC
  Administered 2022-05-16: 50 ug via INTRAVENOUS
  Filled 2022-05-16: qty 2

## 2022-05-16 MED ORDER — TRANEXAMIC ACID-NACL 1000-0.7 MG/100ML-% IV SOLN
1000.0000 mg | INTRAVENOUS | Status: AC
  Administered 2022-05-16: 1000 mg via INTRAVENOUS
  Filled 2022-05-16: qty 100

## 2022-05-16 MED ORDER — BUPIVACAINE LIPOSOME 1.3 % IJ SUSP
INTRAMUSCULAR | Status: DC | PRN
  Administered 2022-05-16: 10 mL via PERINEURAL

## 2022-05-16 MED ORDER — 0.9 % SODIUM CHLORIDE (POUR BTL) OPTIME
TOPICAL | Status: DC | PRN
  Administered 2022-05-16: 1000 mL

## 2022-05-16 MED ORDER — DEXAMETHASONE SODIUM PHOSPHATE 10 MG/ML IJ SOLN
INTRAMUSCULAR | Status: DC | PRN
  Administered 2022-05-16: 10 mg via INTRAVENOUS

## 2022-05-16 MED ORDER — ROCURONIUM BROMIDE 10 MG/ML (PF) SYRINGE
PREFILLED_SYRINGE | INTRAVENOUS | Status: DC | PRN
  Administered 2022-05-16: 70 mg via INTRAVENOUS

## 2022-05-16 MED ORDER — LIDOCAINE 2% (20 MG/ML) 5 ML SYRINGE
INTRAMUSCULAR | Status: DC | PRN
  Administered 2022-05-16: 100 mg via INTRAVENOUS

## 2022-05-16 MED ORDER — WATER FOR IRRIGATION, STERILE IR SOLN
Status: DC | PRN
  Administered 2022-05-16: 2000 mL

## 2022-05-16 MED ORDER — ORAL CARE MOUTH RINSE: 15.0000 mL | Freq: Once | OROMUCOSAL | Status: AC

## 2022-05-16 MED ORDER — PHENYLEPHRINE HCL-NACL 20-0.9 MG/250ML-% IV SOLN
INTRAVENOUS | Status: DC | PRN
  Administered 2022-05-16: 30 ug/min via INTRAVENOUS

## 2022-05-16 SURGICAL SUPPLY — 68 items
AID PSTN UNV HD RSTRNT DISP (MISCELLANEOUS) ×1
BAG COUNTER SPONGE SURGICOUNT (BAG) IMPLANT
BAG SPEC THK2 15X12 ZIP CLS (MISCELLANEOUS) ×1
BAG SPNG CNTER NS LX DISP (BAG)
BAG ZIPLOCK 12X15 (MISCELLANEOUS) ×2 IMPLANT
BIT DRILL 1.6MX128 (BIT) ×2 IMPLANT
BLADE SAW SGTL 73X25 THK (BLADE) ×2 IMPLANT
CEMENT BONE DEPUY (Cement) ×2 IMPLANT
COMP GLENOID 50 LRG SHLDR (Miscellaneous) ×1 IMPLANT
COMPONENT GLENOID 50 LRG SHLDR (Miscellaneous) IMPLANT
COOLER ICEMAN CLASSIC (MISCELLANEOUS) IMPLANT
COVER BACK TABLE 60X90IN (DRAPES) ×1 IMPLANT
COVER SURGICAL LIGHT HANDLE (MISCELLANEOUS) ×1 IMPLANT
DRAPE INCISE IOBAN 66X45 STRL (DRAPES) ×2 IMPLANT
DRAPE ORTHO SPLIT 77X108 STRL (DRAPES) ×2
DRAPE POUCH INSTRU U-SHP 10X18 (DRAPES) ×2 IMPLANT
DRAPE SURG 17X11 SM STRL (DRAPES) ×2 IMPLANT
DRAPE SURG ORHT 6 SPLT 77X108 (DRAPES) ×4 IMPLANT
DRAPE TOP 10253 STERILE (DRAPES) ×2 IMPLANT
DRAPE U-SHAPE 47X51 STRL (DRAPES) ×1 IMPLANT
DRSG AQUACEL AG ADV 3.5X 6 (GAUZE/BANDAGES/DRESSINGS) ×2 IMPLANT
DURAPREP 26ML APPLICATOR (WOUND CARE) ×4 IMPLANT
ELECT BLADE TIP CTD 4 INCH (ELECTRODE) ×1 IMPLANT
ELECT REM PT RETURN 15FT ADLT (MISCELLANEOUS) ×2 IMPLANT
GLOVE BIO SURGEON STRL SZ7.5 (GLOVE) ×2 IMPLANT
GLOVE BIOGEL PI IND STRL 6.5 (GLOVE) ×2 IMPLANT
GLOVE BIOGEL PI IND STRL 8 (GLOVE) ×2 IMPLANT
GLOVE SURG POLYISO LF SZ6.5 (GLOVE) ×2 IMPLANT
GOWN STRL REUS W/ TWL XL LVL3 (GOWN DISPOSABLE) ×1 IMPLANT
GOWN STRL REUS W/TWL XL LVL3 (GOWN DISPOSABLE) ×1
GUIDEWIRE GLENOID 2.5X220 (WIRE) IMPLANT
HANDPIECE INTERPULSE COAX TIP (DISPOSABLE) ×1
HEAD HUM AEQ HO 50X19 (Head) IMPLANT
HEMOSTAT SURGICEL 2X14 (HEMOSTASIS) ×1 IMPLANT
HEMOSTAT SURGICEL 4X8 (HEMOSTASIS) IMPLANT
HOOD PEEL AWAY FLYTE STAYCOOL (MISCELLANEOUS) ×6 IMPLANT
KIT BASIN OR (CUSTOM PROCEDURE TRAY) ×2 IMPLANT
KIT TURNOVER KIT A (KITS) IMPLANT
MANIFOLD NEPTUNE II (INSTRUMENTS) ×2 IMPLANT
NDL MA TROC 1/2 CIR (NEEDLE) IMPLANT
NDL TROCAR POINT SZ 2 1/2 (NEEDLE) ×2 IMPLANT
NEEDLE MA TROC 1/2 CIR (NEEDLE) ×1 IMPLANT
NEEDLE TROCAR POINT SZ 2 1/2 (NEEDLE) ×1 IMPLANT
NS IRRIG 1000ML POUR BTL (IV SOLUTION) ×2 IMPLANT
PACK SHOULDER (CUSTOM PROCEDURE TRAY) ×2 IMPLANT
PAD COLD SHLDR WRAP-ON (PAD) IMPLANT
PROTECTOR NERVE ULNAR (MISCELLANEOUS) IMPLANT
RESTRAINT HEAD UNIVERSAL NS (MISCELLANEOUS) ×1 IMPLANT
RETRIEVER SUT HEWSON (MISCELLANEOUS) ×2 IMPLANT
SET HNDPC FAN SPRY TIP SCT (DISPOSABLE) ×1 IMPLANT
SLING ARM IMMOBILIZER LRG (SOFTGOODS) IMPLANT
SMARTMIX MINI TOWER (MISCELLANEOUS) ×1
SPONGE T-LAP 4X18 ~~LOC~~+RFID (SPONGE) ×4 IMPLANT
STEM HUMERAL AEQ 74X3A 127.5D (Stem) IMPLANT
STRIP CLOSURE SKIN 1/2X4 (GAUZE/BANDAGES/DRESSINGS) ×2 IMPLANT
SUCTION FRAZIER HANDLE 12FR (TUBING) ×1
SUCTION TUBE FRAZIER 12FR DISP (TUBING) ×1 IMPLANT
SUPPORT WRAP ARM LG (MISCELLANEOUS) ×2 IMPLANT
SUT ETHIBOND 2 V 37 (SUTURE) ×2 IMPLANT
SUT MNCRL AB 4-0 PS2 18 (SUTURE) ×2 IMPLANT
SUT VIC AB 2-0 CT1 27 (SUTURE) ×2
SUT VIC AB 2-0 CT1 TAPERPNT 27 (SUTURE) ×4 IMPLANT
TAPE LABRALWHITE 1.5X36 (TAPE) ×2 IMPLANT
TAPE STRIPS DRAPE STRL (GAUZE/BANDAGES/DRESSINGS) IMPLANT
TAPE SUT LABRALTAP WHT/BLK (SUTURE) ×2 IMPLANT
TOWEL OR 17X26 10 PK STRL BLUE (TOWEL DISPOSABLE) ×2 IMPLANT
TOWER SMARTMIX MINI (MISCELLANEOUS) ×2 IMPLANT
WATER STERILE IRR 1000ML POUR (IV SOLUTION) ×2 IMPLANT

## 2022-05-16 NOTE — Discharge Instructions (Signed)
Discharge Instructions after Total Shoulder Arthroplasty   A sling has been provided for you. Remove the sling 5 times each day to perform motion exercises. After the first 48 to 72 hours, discontinue using the sling. You should use the sling as a protective device, if you are in a crowd.  Use ice on the shoulder intermittently over the first 48 hours after surgery.  Pain medication has been prescribed for you.  Use your medication liberally over the first 48 hours, and then begin to taper your use. You may take Extra Strength Tylenol or Tylenol only in place of the pain pills. DO NOT take ANY nonsteroidal anti-inflammatory pain medications: Advil, Motrin, Ibuprofen, Aleve, Naproxen, or Naprosyn. Take one aspirin a day for 2 weeks after surgery, unless you have an aspirin sensitivity/allergy or asthma. Leave your dressing on until your first follow up visit.  You may shower with the dressing.  Hold your arm as if you still have your sling on while you shower. Active reaching and lifting are not permitted. You may use the operative arm for activities of daily living that do not require the operative arm to leave the side of the body, such as eating, drinking, bathing, etc.  Three to 5 times each day you should perform assisted overhead reaching and external rotation (outward turning) exercises with the operative arm. You were taught these exercises prior to discharge. Both exercises should be done with the non-operative arm used as the "therapist arm" while the operative arm remains relaxed. Ten of each exercise should be done three to five times each day.   Overhead reach is helping to lift your stiff arm up as high as it will go. To stretch your overhead reach, lie flat on your back, relax, and grasp the wrist of the tight shoulder with your opposite hand. Using the power in your opposite arm, bring the stiff arm up as far as it is comfortable. Start holding it for ten seconds and then work up to where  you can hold it for a count of 30. Breathe slowly and deeply while the arm is moved. Repeat this stretch ten times, trying to help the ar up a little higher each time.     External rotation is turning the arm out to the side while your elbow stays close to your body. External rotation is best stretched while you are lying on your back. Hold a cane, yardstick, broom handle, or dowel in both hands. Bend both elbows to a right angle. Use steady, gentle force from your normal arm to rotate the hand of the stiff shoulder out away from your body. Continue the rotation until it is straight in front of you holding it there for a count of 10. Do not go beyond this level of rotation until seen back by Dr. Zephyra Bernardi. Repeat this exercise ten times slowly.      Please call [phone] during normal business hours or [phone] after hours for any problems. Including the following:  - excessive redness of the incisions - drainage for more than 4 days - fever of more than 101.5 F  *Please note that pain medications will not be refilled after hours or on weekends.    

## 2022-05-16 NOTE — H&P (Signed)
David [name] is an 76 y.o. male.   Chief Complaint: L shoulder pain and dysfunction HPI: Endstage L shoulder arthritis with significant pain and dysfunction, failed conservative measures.  Pain interferes with sleep and quality of life.   Past Medical History:  Diagnosis Date   Altitude sickness    Asthma    exercise-induced; prn inhaler   AV block, 2nd degree    Bilateral inguinal hernia (BIH) 10/16/2012   Overview:  [facility] -Dr. Brantley Stage.  Observation for now 10/2012   Complete heart block ([place]) 04/04/2018   COVID-19    DVT (deep venous thrombosis) ([place]) 08/13/2018   Right peroneal DVT 08/13/18, post right TKA 08/10/18   Insomnia 07/14/2014   Mobitz type 1 second degree atrioventricular block    Nasal polyps    OA (osteoarthritis) 09/24/2012   Osteoarthritis of right knee 08/06/2018   Reactive airway disease 09/24/2012   Rotator cuff tear, right    impingement, AC arthrosis    Past Surgical History:  Procedure Laterality Date   A-FLUTTER ABLATION N/A 02/24/2019   Procedure: A-FLUTTER ABLATION;  Surgeon: Constance Haw, MD;  Location: [place] [facility];  Service: Cardiovascular;  Laterality: N/A;   BACK SURGERY     BIOPSY  07/23/2019   Procedure: BIOPSY;  Surgeon: Carol Ada, MD;  [facility];  Service: Endoscopy;;   CARDIAC CATHETERIZATION     CARPAL TUNNEL RELEASE Bilateral 2021   CATARACT EXTRACTION W/ INTRAOCULAR LENS IMPLANT Right    CATARACT EXTRACTION W/ INTRAOCULAR LENS IMPLANT Left    per patient about two years ago 2019   COLONOSCOPY WITH PROPOFOL N/A 07/23/2019   Procedure: COLONOSCOPY WITH PROPOFOL;  Surgeon: Carol Ada, MD;  [facility];  Service: Endoscopy;  Laterality: N/A;   EYE SURGERY     HERNIA REPAIR     INGUINAL HERNIA REPAIR Right 05/09/2020   Procedure: OPEN RIGHT INGUINAL HERNIA REPAIR;  Surgeon: [name], MD;  Location: [place];  Service: General;  Laterality: Right;   INSERTION OF MESH Right  05/09/2020   Procedure: INSERTION OF MESH;  Surgeon: [name], MD;  Location: [place];  Service: General;  Laterality: Right;   LEFT HEART CATH AND CORONARY ANGIOGRAPHY N/A 04/06/2018   Procedure: LEFT HEART CATH AND CORONARY ANGIOGRAPHY;  Surgeon: Jettie Booze, MD;  Location: [place] [facility];  Service: Cardiovascular;  Laterality: N/A;   LUMBAR LAMINECTOMY  age 40s   NASAL SINUS SURGERY     POLYPECTOMY  07/23/2019   Procedure: POLYPECTOMY;  Surgeon: Carol Ada, MD;  [facility];  Service: Endoscopy;;   REFRACTIVE SURGERY     ROTATOR CUFF REPAIR Right    SHOULDER ARTHROSCOPY  08/06/2011   Procedure: ARTHROSCOPY SHOULDER;  Surgeon: Cammie Sickle., MD;  Location: [place];  Service: Orthopedics;  Laterality: Right;  with subacromial decompression, arthroscopic subscapularis repair, and rotator cuff repair   TOTAL HIP ARTHROPLASTY Right 10/02/2020   Procedure: RIGHT HIP ARTHROPLASTY ANTERIOR APPROACH;  Surgeon: Frederik Pear, MD;  [facility];  Service: Orthopedics;  Laterality: Right;  3E BED   TOTAL KNEE ARTHROPLASTY Right 08/10/2018   Procedure: TOTAL KNEE ARTHROPLASTY;  Surgeon: Frederik Pear, MD;  [facility];  Service: Orthopedics;  Laterality: Right;   TRIGGER FINGER RELEASE Right 06/30/2014   Procedure: RELEASE A-1 PULLEY RIGHT THUMB;  Surgeon: Daryll Brod, MD;  Location: [place];  Service: Orthopedics;  Laterality: Right;  ANESTHESIA:  IV REGIONAL  FAB    Family History  Problem Relation Age of Onset   Heart Problems Mother        had pacemaker, lived to 34   Alzheimer's disease Father    Prostate cancer Father    Social History:  reports that he has never smoked. He has never used smokeless tobacco. He reports current alcohol use. He reports that he does not use drugs.  Allergies:  Allergies  Allergen Reactions   Codeine Itching    Medications Prior to Admission  Medication Sig Dispense Refill    albuterol (VENTOLIN HFA) 108 (90 Base) MCG/ACT inhaler Inhale 2 puffs into the lungs every 4 (four) hours as needed for wheezing or shortness of breath. 8 g 1   fluticasone-salmeterol (ADVAIR HFA) 230-21 MCG/ACT inhaler Inhale 2 puffs into the lungs 2 (two) times daily. 1 each 11   fluticasone (FLONASE) 50 MCG/ACT nasal spray Place 1 spray into both nostrils daily. (Patient not taking: Reported on 05/03/2022) 18.2 mL 2    No results found for this or any previous visit (from the past 48 hour(s)). No results found.  Review of Systems  All other systems reviewed and are negative.   Blood pressure (!) 153/84, pulse (!) 43, temperature 97.6 F (36.4 C), temperature source Oral, resp. rate 15, height '5\' 8"'$  (1.727 m), weight 74.4 kg, SpO2 100 %. Physical Exam Constitutional:      Appearance: He is well-developed.  HENT:     Head: Atraumatic.  Eyes:     Extraocular Movements: Extraocular movements intact.  Cardiovascular:     Pulses: Normal pulses.  Pulmonary:     Effort: Pulmonary effort is normal.  Musculoskeletal:     Comments: Left shoulder pain with limited ROM. NVID  Skin:    General: Skin is warm and dry.  Neurological:     Mental Status: He is alert and oriented to person, place, and time.  Psychiatric:        Mood and Affect: Mood normal.      Assessment/Plan Endstage L shoulder arthritis Plan L TSA Risks / benefits of surgery discussed Consent on chart  NPO for OR Preop antibiotics   Rhae Hammock, MD 05/16/2022, 8:54 AM

## 2022-05-16 NOTE — Anesthesia Procedure Notes (Signed)
Anesthesia Regional Block: Interscalene brachial plexus block   Pre-Anesthetic Checklist: , timeout performed,  Correct Patient, Correct Site, Correct Laterality,  Correct Procedure, Correct Position, site marked,  Risks and benefits discussed,  Surgical consent,  Pre-op evaluation,  At surgeon's request and post-op pain management  Laterality: Left  Prep: chloraprep       Needles:  Injection technique: Single-shot  Needle Type: Echogenic Stimulator Needle     Needle Length: 10cm  Needle Gauge: 21   Needle insertion depth: 6 cm   Additional Needles:     Motor weakness within 8 minutes.  Narrative:  Start time: 05/16/2022 8:45 AM End time: 05/16/2022 8:50 AM Injection made incrementally with aspirations every 5 mL.  Performed by: Personally  Anesthesiologist: Josephine Igo, MD  Additional Notes: Timeout performed. Patient sedated. Relevant anatomy ID'd using [place]. Incremental 2-85m injection of LA with frequent aspiration. Patient tolerated procedure well.     Left Interscalene Block

## 2022-05-16 NOTE — Anesthesia Postprocedure Evaluation (Signed)
Anesthesia Post Note  Patient: LJ MIYAMOTO  Procedure(s) Performed: TOTAL SHOULDER ARTHROPLASTY (Left: Shoulder)     Patient location during evaluation: PACU Anesthesia Type: General Level of consciousness: awake and alert and oriented Pain management: pain level controlled Vital Signs Assessment: post-procedure vital signs reviewed and stable Respiratory status: spontaneous breathing, nonlabored ventilation and respiratory function stable Cardiovascular status: blood pressure returned to baseline and stable Postop Assessment: no apparent nausea or vomiting Anesthetic complications: no   No notable events documented.  Last Vitals:  Vitals:   05/16/22 0920 05/16/22 1145  BP: (!) 183/107 (!) 162/95  Pulse: (!) 42 67  Resp: 16 13  Temp:  36.8 C  SpO2: 98% 100%    Last Pain:  Vitals:   05/16/22 1145  TempSrc:   PainSc: 0-No pain                 Anjelique Makar A.

## 2022-05-16 NOTE — Anesthesia Procedure Notes (Signed)
Procedure Name: Intubation Date/Time: 05/16/2022 9:48 AM  Performed by: Lind Covert, CRNAPre-anesthesia Checklist: Patient identified, Emergency Drugs available, Suction available, Patient being monitored and Timeout performed Patient Re-evaluated:Patient Re-evaluated prior to induction Oxygen Delivery Method: Circle system utilized Preoxygenation: Pre-oxygenation with 100% oxygen Induction Type: IV induction Ventilation: Mask ventilation without difficulty Laryngoscope Size: Mac and 4 Grade View: Grade I Tube type: Oral Tube size: 7.5 mm Number of attempts: 1 Airway Equipment and Method: Stylet Placement Confirmation: ETT inserted through vocal cords under direct vision, positive ETCO2 and breath sounds checked- equal and bilateral Secured at: 21 cm Tube secured with: Tape Dental Injury: Teeth and Oropharynx as per pre-operative assessment

## 2022-05-16 NOTE — Transfer of Care (Signed)
Immediate Anesthesia Transfer of Care Note  Patient: [name]  Procedure(s) Performed: TOTAL SHOULDER ARTHROPLASTY (Left: Shoulder)  Patient Location: PACU  Anesthesia Type:General  Level of Consciousness: sedated  Airway & Oxygen Therapy: Patient Spontanous Breathing and Patient connected to face mask oxygen  Post-op Assessment: Report given to RN and Post -op Vital signs reviewed and stable  Post vital signs: Reviewed and stable  Last Vitals:  Vitals Value Taken Time  BP 162/95 05/16/22 1145  Temp    Pulse 66 05/16/22 1146  Resp 12 05/16/22 1146  SpO2 100 % 05/16/22 1146  Vitals shown include unvalidated device data.  Last Pain:  Vitals:   05/16/22 0835  TempSrc: Oral  PainSc:          Complications: No notable events documented.

## 2022-05-16 NOTE — Op Note (Signed)
Procedure(s): TOTAL SHOULDER ARTHROPLASTY Procedure Note  David [name] male 76 y.o. 05/16/2022   Preoperative diagnosis: Left shoulder end-stage osteoarthritis  Postoperative diagnosis: Same  Procedure(s) and Anesthesia Type:    * TOTAL SHOULDER ARTHROPLASTY - Choice  Surgeon(s) and Role:    Tania Ade, MD - Primary   Indications:  76 y.o. male  With endstage left shoulder arthritis. Pain and dysfunction interfered with quality of life and nonoperative treatment with activity modification, NSAIDS and injections failed.     Surgeon: Rhae Hammock   Assistants:  Nehemiah Massed PA-C was present and scrubbed throughout the procedure and was essential in positioning, retraction, exposure, and closure)  Anesthesia: General endotracheal anesthesia with preoperative interscalene block given by the attending anesthesiologist     Procedure Detail  TOTAL SHOULDER ARTHROPLASTY  Findings: Tornier flex anatomic press-fit size 3 stem with a 50 x 19 head, cemented size large 50 Cortiloc glenoid.   A lesser tuberosity osteotomy was performed and repaired at the conclusion of the procedure.  Estimated Blood Loss:  200 mL         Drains: None   Blood Given: none          Specimens: none        Complications:  * No complications entered in OR log *         Disposition: PACU - hemodynamically stable.         Condition: stable    Procedure:   The patient was identified in the preoperative holding area where I personally marked the operative extremity after verifying with the patient and consent. He  was taken to the operating room where He was transferred to the   operative table.  The patient received an interscalene block in   the holding area by the attending anesthesiologist.  General anesthesia was induced   in the operating room without complication.  The patient did receive IV  Ancef prior to the commencement of the procedure.  The patient was   placed in the  beach-chair position with the back raised about 30   degrees.  The nonoperative extremity and head and neck were carefully   positioned and padded protecting against neurovascular compromise.  The   left upper extremity was then prepped and draped in the standard sterile   fashion.    The appropriate operative time-out was performed with   Anesthesia, the perioperative staff, as well as myself and we all agreed   that the left side was the correct operative site.  An approximately   10 cm incision was made from the tip of the coracoid to the center point of the   humerus at the level of the axilla.  Dissection was carried down sharply   through subcutaneous tissues and cephalic vein was identified and taken   laterally with the deltoid.  The pectoralis major was taken medially.  The   upper 1 cm of the pectoralis major was released from its attachment on   the humerus.  The clavipectoral fascia was incised just lateral to the   conjoined tendon.  This incision was carried up to but not into the   coracoacromial ligament.  Digital palpation was used to prove   integrity of the axillary nerve which was protected throughout the   procedure.  Musculocutaneous nerve was not palpated in the operative   field.  Conjoined tendon was then retracted gently medially and the   deltoid laterally.  Anterior circumflex humeral vessels  were clamped and   coagulated.  The soft tissues overlying the biceps was incised and this   incision was carried across the transverse humeral ligament to the base   of the coracoid.  The biceps was noted to be severely degenerated. It was released from the superior labrum. The biceps was then tenodesed to the soft tissue just above   pectoralis major and the remaining portion of the biceps superiorly was   excised.  An osteotomy was performed at the lesser tuberosity.  The capsule was then   released all the way down to the 6 o'clock position of the humeral head.   The  humeral head was then delivered with simultaneous adduction,   extension and external rotation.  All humeral osteophytes were removed   and the anatomic neck of the humerus was marked and cut free hand at   approximately 25 degrees retroversion within about 3 mm of the cuff   reflection posteriorly.  The head size was estimated to be a 50 medium   offset.  At that point, the humeral head was retracted posteriorly with   a Fukuda retractor.   Remaining portion of the capsule was released at the base of the   coracoid.  The remaining biceps anchor and the entire anterior-inferior   labrum was excised.  The posterior labrum was also excised but the   posterior capsule was not released.  The guidepin was placed bicortically with noin elevated guide.  The reamer was used to ream to concentric bone with punctate bleeding.  This gave an excellent concentric surface.  The center hole was then drilled for an anchor peg glenoid followed by the three peripheral holes and none of the holes   exited the glenoid wall.  I then pulse irrigated these holes and dried   them with Surgicel.  The three peripheral holes were then   pressurized cemented and the anchor peg glenoid was placed and impacted   with an excellent fit.  The glenoid was a 50 large component.  The proximal humerus was then again exposed taking care not to displace the glenoid.    The entry awl was used followed by sounding reamers and then sequentially broached from size 1to 3. This was then left in place and the calcar planer was used. Trial head was placed with a 50 x 19.  With the trial implantation of the component,  there was approximately 50% posterior translation with immediate snap back to the   anatomic position.  With forward elevation, there was no tendency   towards posterior subluxation.   The trial was removed and the final implant was prepared on a back table.  The trial was removed and the final implant was prepared on a back table.    3 small holes were drilled on the medial side of the lesser tuberosity osteotomy, through which 2 labral tapes were passed. The implant was then placed through the loop of the 2 labral tapes and impacted with an excellent press-fit. This achieved excellent anatomic reconstruction of the proximal humerus.  The joint was then copiously irrigated with pulse lavage.  The subscapularis and   lesser tuberosity osteotomy were then repaired using the 2 labral tapes previously passed in a double row fashion with horizontal mattress sutures medially brought over through bone tunnels tied over a bone bridge laterally.   One #1 Ethibond was placed at the rotator interval just above   the lesser tuberosity. Copious irrigation was used. Skin was  closed with 2-0 Vicryl sutures in the deep dermal layer and 4-0 Monocryl in a subcuticular  running fashion.  Sterile dressings were then applied including Aquacel.  The patient was placed in a sling and allowed to awaken from general anesthesia and taken to the recovery room in stable condition.      POSTOPERATIVE PLAN:  Early passive range of motion will be allowed with the goal of 0 degrees external rotation and 90 degrees forward elevation.  No internal rotation at this time.  No active motion of the arm until the lesser tuberosity heals.  The patient will be observed in the recovery room and if his pain is well controlled with the regional block and he is hemodynamically stable he can be discharged home today.

## 2022-05-17 ENCOUNTER — Encounter (HOSPITAL_COMMUNITY): Payer: Self-pay | Admitting: Orthopedic Surgery

## 2022-05-21 DIAGNOSIS — Z23 Encounter for immunization: Secondary | ICD-10-CM | POA: Diagnosis not present

## 2022-05-23 ENCOUNTER — Ambulatory Visit (INDEPENDENT_AMBULATORY_CARE_PROVIDER_SITE_OTHER): Payer: Medicare Other | Admitting: *Deleted

## 2022-05-23 VITALS — BP 153/84 | HR 40 | Resp 18 | Ht 68.0 in | Wt 162.4 lb

## 2022-05-23 DIAGNOSIS — J4550 Severe persistent asthma, uncomplicated: Secondary | ICD-10-CM

## 2022-05-23 MED ORDER — OMALIZUMAB 150 MG/ML ~~LOC~~ SOSY
300.0000 mg | PREFILLED_SYRINGE | Freq: Once | SUBCUTANEOUS | Status: AC
  Administered 2022-05-23: 300 mg via SUBCUTANEOUS

## 2022-05-23 NOTE — Progress Notes (Signed)
Diagnosis: Asthma  Provider:  Marshell Garfinkel MD  Procedure: Injection  Xolair (Omalizumab), Dose: 300 mg, Site: subcutaneous, Number of injections: 2  Discharge: Condition: Good, Destination: Home . AVS provided to patient.   Performed by:  Oren Beckmann, RN

## 2022-05-27 DIAGNOSIS — Z471 Aftercare following joint replacement surgery: Secondary | ICD-10-CM | POA: Diagnosis not present

## 2022-05-27 DIAGNOSIS — M25512 Pain in left shoulder: Secondary | ICD-10-CM | POA: Diagnosis not present

## 2022-05-27 DIAGNOSIS — Z96612 Presence of left artificial shoulder joint: Secondary | ICD-10-CM | POA: Diagnosis not present

## 2022-06-09 ENCOUNTER — Emergency Department (HOSPITAL_BASED_OUTPATIENT_CLINIC_OR_DEPARTMENT_OTHER)
Admission: EM | Admit: 2022-06-09 | Discharge: 2022-06-09 | Disposition: A | Discharge status: Home / Self Care | Payer: Medicare Other | Attending: Student | Admitting: Student

## 2022-06-09 ENCOUNTER — Emergency Department (HOSPITAL_BASED_OUTPATIENT_CLINIC_OR_DEPARTMENT_OTHER): Payer: Medicare Other | Admitting: Radiology

## 2022-06-09 ENCOUNTER — Other Ambulatory Visit: Payer: Self-pay

## 2022-06-09 DIAGNOSIS — Z96612 Presence of left artificial shoulder joint: Secondary | ICD-10-CM | POA: Diagnosis not present

## 2022-06-09 DIAGNOSIS — J4550 Severe persistent asthma, uncomplicated: Secondary | ICD-10-CM | POA: Insufficient documentation

## 2022-06-09 DIAGNOSIS — I2489 Other forms of acute ischemic heart disease: Secondary | ICD-10-CM | POA: Diagnosis not present

## 2022-06-09 DIAGNOSIS — Z20822 Contact with and (suspected) exposure to covid-19: Secondary | ICD-10-CM | POA: Insufficient documentation

## 2022-06-09 DIAGNOSIS — R778 Other specified abnormalities of plasma proteins: Secondary | ICD-10-CM | POA: Diagnosis not present

## 2022-06-09 DIAGNOSIS — J069 Acute upper respiratory infection, unspecified: Secondary | ICD-10-CM | POA: Diagnosis not present

## 2022-06-09 DIAGNOSIS — Z7951 Long term (current) use of inhaled steroids: Secondary | ICD-10-CM | POA: Insufficient documentation

## 2022-06-09 DIAGNOSIS — I4892 Unspecified atrial flutter: Secondary | ICD-10-CM | POA: Diagnosis not present

## 2022-06-09 DIAGNOSIS — R7989 Other specified abnormal findings of blood chemistry: Secondary | ICD-10-CM | POA: Diagnosis not present

## 2022-06-09 DIAGNOSIS — Z8616 Personal history of COVID-19: Secondary | ICD-10-CM | POA: Diagnosis not present

## 2022-06-09 DIAGNOSIS — Z96651 Presence of right artificial knee joint: Secondary | ICD-10-CM | POA: Insufficient documentation

## 2022-06-09 DIAGNOSIS — I483 Typical atrial flutter: Secondary | ICD-10-CM | POA: Diagnosis not present

## 2022-06-09 DIAGNOSIS — R062 Wheezing: Secondary | ICD-10-CM

## 2022-06-09 DIAGNOSIS — J029 Acute pharyngitis, unspecified: Secondary | ICD-10-CM | POA: Diagnosis not present

## 2022-06-09 DIAGNOSIS — Z96641 Presence of right artificial hip joint: Secondary | ICD-10-CM | POA: Diagnosis not present

## 2022-06-09 LAB — CBC WITH DIFFERENTIAL/PLATELET
Abs Immature Granulocytes: 0.03 10*3/uL (ref 0.00–0.07)
Basophils Absolute: 0 10*3/uL (ref 0.0–0.1)
Basophils Relative: 0 %
Eosinophils Absolute: 0 10*3/uL (ref 0.0–0.5)
Eosinophils Relative: 0 %
HCT: 40.8 % (ref 39.0–52.0)
Hemoglobin: 14.1 g/dL (ref 13.0–17.0)
Immature Granulocytes: 0 %
Lymphocytes Relative: 10 %
Lymphs Abs: 0.9 10*3/uL (ref 0.7–4.0)
MCH: 32.5 pg (ref 26.0–34.0)
MCHC: 34.6 g/dL (ref 30.0–36.0)
MCV: 94 fL (ref 80.0–100.0)
Monocytes Absolute: 0.4 10*3/uL (ref 0.1–1.0)
Monocytes Relative: 4 %
Neutro Abs: 7.3 10*3/uL (ref 1.7–7.7)
Neutrophils Relative %: 86 %
Platelets: 180 10*3/uL (ref 150–400)
RBC: 4.34 MIL/uL (ref 4.22–5.81)
RDW: 13.8 % (ref 11.5–15.5)
WBC: 8.5 10*3/uL (ref 4.0–10.5)
nRBC: 0 % (ref 0.0–0.2)

## 2022-06-09 LAB — RESP PANEL BY RT-PCR (FLU A&B, COVID) ARPGX2
Influenza A by PCR: NEGATIVE
Influenza B by PCR: NEGATIVE
SARS Coronavirus 2 by RT PCR: NEGATIVE

## 2022-06-09 LAB — COMPREHENSIVE METABOLIC PANEL
ALT: 12 U/L (ref 0–44)
AST: 20 U/L (ref 15–41)
Albumin: 3.8 g/dL (ref 3.5–5.0)
Alkaline Phosphatase: 54 U/L (ref 38–126)
Anion gap: 12 (ref 5–15)
BUN: 23 mg/dL (ref 8–23)
CO2: 25 mmol/L (ref 22–32)
Calcium: 9 mg/dL (ref 8.9–10.3)
Chloride: 102 mmol/L (ref 98–111)
Creatinine, Ser: 1.29 mg/dL — ABNORMAL HIGH (ref 0.61–1.24)
GFR, Estimated: 58 mL/min — ABNORMAL LOW (ref >60)
Glucose, Bld: 127 mg/dL — ABNORMAL HIGH (ref 70–99)
Potassium: 3.4 mmol/L — ABNORMAL LOW (ref 3.5–5.1)
Sodium: 139 mmol/L (ref 135–145)
Total Bilirubin: 0.8 mg/dL (ref 0.3–1.2)
Total Protein: 6.5 g/dL (ref 6.5–8.1)

## 2022-06-09 LAB — BRAIN NATRIURETIC PEPTIDE: B Natriuretic Peptide: 400.6 pg/mL — ABNORMAL HIGH (ref 0.0–100.0)

## 2022-06-09 LAB — TROPONIN I (HIGH SENSITIVITY)
Troponin I (High Sensitivity): 204 ng/L (ref <18)
Troponin I (High Sensitivity): 206 ng/L (ref <18)

## 2022-06-09 LAB — GROUP A STREP BY PCR: Group A Strep by PCR: NOT DETECTED

## 2022-06-09 MED ORDER — ALBUTEROL (5 MG/ML) CONTINUOUS INHALATION SOLN
10.0000 mg/h | INHALATION_SOLUTION | RESPIRATORY_TRACT | Status: DC
  Filled 2022-06-09 (×2): qty 5

## 2022-06-09 MED ORDER — IPRATROPIUM-ALBUTEROL 0.5-2.5 (3) MG/3ML IN SOLN: 3.0000 mL | Freq: Once | RESPIRATORY_TRACT | Status: AC

## 2022-06-09 MED ORDER — LACTATED RINGERS IV BOLUS
1000.0000 mL | Freq: Once | INTRAVENOUS | Status: AC
  Administered 2022-06-09: 1000 mL via INTRAVENOUS

## 2022-06-09 MED ORDER — APIXABAN 5 MG PO TABS: 5.0000 mg | ORAL_TABLET | Freq: Two times a day (BID) | ORAL | 0 refills | Status: DC

## 2022-06-09 MED ORDER — POTASSIUM CHLORIDE CRYS ER 20 MEQ PO TBCR
40.0000 meq | EXTENDED_RELEASE_TABLET | Freq: Once | ORAL | Status: AC
  Administered 2022-06-09: 40 meq via ORAL
  Filled 2022-06-09: qty 2

## 2022-06-09 MED ORDER — IPRATROPIUM-ALBUTEROL 0.5-2.5 (3) MG/3ML IN SOLN
6.0000 mL | Freq: Once | RESPIRATORY_TRACT | Status: AC
  Administered 2022-06-09: 6 mL via RESPIRATORY_TRACT
  Filled 2022-06-09: qty 6

## 2022-06-09 MED ORDER — IPRATROPIUM-ALBUTEROL 0.5-2.5 (3) MG/3ML IN SOLN
RESPIRATORY_TRACT | Status: AC
  Administered 2022-06-09: 3 mL via RESPIRATORY_TRACT
  Filled 2022-06-09: qty 3

## 2022-06-09 MED ORDER — ASPIRIN 325 MG PO TABS
325.0000 mg | ORAL_TABLET | Freq: Every day | ORAL | Status: DC
  Administered 2022-06-09: 325 mg via ORAL
  Filled 2022-06-09: qty 1

## 2022-06-09 MED ORDER — MAGNESIUM OXIDE -MG SUPPLEMENT 400 (240 MG) MG PO TABS
800.0000 mg | ORAL_TABLET | Freq: Once | ORAL | Status: AC
  Administered 2022-06-09: 800 mg via ORAL
  Filled 2022-06-09: qty 2

## 2022-06-09 MED ORDER — ALBUTEROL (5 MG/ML) CONTINUOUS INHALATION SOLN
INHALATION_SOLUTION | RESPIRATORY_TRACT | Status: AC
  Administered 2022-06-09: 10 mg/h via RESPIRATORY_TRACT
  Filled 2022-06-09: qty 2

## 2022-06-09 MED ORDER — PREDNISONE 50 MG PO TABS
60.0000 mg | ORAL_TABLET | Freq: Once | ORAL | Status: AC
  Administered 2022-06-09: 60 mg via ORAL
  Filled 2022-06-09: qty 1

## 2022-06-09 NOTE — ED Provider Notes (Signed)
[facility] EMERGENCY DEPT Provider Note  CSN: [id number] Arrival date & time: 06/09/22 0920  Chief Complaint(s) URI  HPI David [name] is a 76 y.o. male with PMH adult onset asthma on monthly injectable biologic who presents to the emergency department for evaluation of a sore throat and chest congestion.  Patient states that his son has been sick and coughing for the last 3 weeks with similar symptoms.  He states that his symptoms have been worse for the last 3 days but he is currently denying chest pain, shortness of breath, abdominal pain, nausea, vomiting or other systemic symptoms.   Past Medical History Past Medical History:  Diagnosis Date   Altitude sickness    Asthma    exercise-induced; prn inhaler   AV block, 2nd degree    Bilateral inguinal hernia (BIH) 10/16/2012   Overview:  [facility] -Dr. Brantley Stage.  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, post right TKA 08/10/18   Insomnia 07/14/2014   Mobitz type 1 second degree atrioventricular block    Nasal polyps    OA (osteoarthritis) 09/24/2012   Osteoarthritis of right knee 08/06/2018   Reactive airway disease 09/24/2012   Rotator cuff tear, right    impingement, AC arthrosis   Patient Active Problem List   Diagnosis Date Noted   URI (upper respiratory infection) 10/24/2021   Bronchitis 10/24/2021   Severe persistent asthma 10/30/2020   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 Authorizing Provider  albuterol (VENTOLIN HFA) 108 (90 Base) MCG/ACT inhaler Inhale 2 puffs into the lungs every 4 (four) hours as needed for wheezing or shortness of breath. 10/18/21   Margaretha Seeds, MD  fluticasone [facility]) 50 MCG/ACT nasal spray Place 1 spray into both nostrils daily. Patient not taking: Reported on 05/03/2022 10/24/21   Clayton Bibles, NP  fluticasone-salmeterol (ADVAIR HFA) 230-21 MCG/ACT inhaler Inhale 2 puffs into the lungs 2 (two) times daily. 01/15/22   Margaretha Seeds, MD  oxyCODONE-acetaminophen (PERCOCET) 5-325 MG tablet Take 1 tablet by mouth every 4 (four) hours as needed for severe pain. 05/16/22 05/16/23  Tania Ade, MD  Past Surgical History Past Surgical History:  Procedure Laterality Date   A-FLUTTER ABLATION N/A 02/24/2019   Procedure: A-FLUTTER ABLATION;  Surgeon: Constance Haw, MD;  Location: [facility];  Service: Cardiovascular;  Laterality: N/A;   BACK SURGERY     BIOPSY  07/23/2019   Procedure: BIOPSY;  Surgeon: Carol Ada, MD;  Location: [facility];  Service: Endoscopy;;   CARDIAC CATHETERIZATION     CARPAL TUNNEL RELEASE Bilateral 2021   CATARACT EXTRACTION W/ INTRAOCULAR LENS IMPLANT Right    CATARACT EXTRACTION W/ INTRAOCULAR LENS IMPLANT Left    per patient about two years ago 2019   COLONOSCOPY WITH PROPOFOL N/A 07/23/2019   Procedure: COLONOSCOPY WITH PROPOFOL;  Surgeon: Carol Ada, MD;  Location: [facility];  Service: Endoscopy;  Laterality: N/A;   EYE SURGERY     HERNIA REPAIR     INGUINAL HERNIA REPAIR Right 05/09/2020   Procedure: OPEN RIGHT INGUINAL HERNIA REPAIR;  Surgeon: [name], MD;  Location: [facility];  Service: General;  Laterality: Right;    INSERTION OF MESH Right 05/09/2020   Procedure: INSERTION OF MESH;  Surgeon: [name], MD;  Location: [facility];  Service: General;  Laterality: Right;   LEFT HEART CATH AND CORONARY ANGIOGRAPHY N/A 04/06/2018   Procedure: LEFT HEART CATH AND CORONARY ANGIOGRAPHY;  Surgeon: Jettie Booze, MD;  Location: [facility];  Service: Cardiovascular;  Laterality: N/A;   LUMBAR LAMINECTOMY  age 55s   NASAL SINUS SURGERY     POLYPECTOMY  07/23/2019   Procedure: POLYPECTOMY;  Surgeon: Carol Ada, MD;  Location: [facility];  Service: Endoscopy;;   REFRACTIVE SURGERY     ROTATOR CUFF REPAIR Right    SHOULDER ARTHROSCOPY  08/06/2011   Procedure: ARTHROSCOPY SHOULDER;  Surgeon: Cammie Sickle., MD;  Location: [facility] [place];  Service: Orthopedics;  Laterality: Right;  with subacromial decompression, arthroscopic subscapularis repair, and rotator cuff repair   TOTAL HIP ARTHROPLASTY Right 10/02/2020   Procedure: RIGHT HIP ARTHROPLASTY ANTERIOR APPROACH;  Surgeon: Frederik Pear, MD;  Location: [facility];  Service: Orthopedics;  Laterality: Right;  3E BED   TOTAL KNEE ARTHROPLASTY Right 08/10/2018   Procedure: TOTAL KNEE ARTHROPLASTY;  Surgeon: Frederik Pear, MD;  Location: [facility];  Service: Orthopedics;  Laterality: Right;   TOTAL SHOULDER ARTHROPLASTY Left 05/16/2022   Procedure: TOTAL SHOULDER ARTHROPLASTY;  Surgeon: Tania Ade, MD;  Location: [facility];  Service: Orthopedics;  Laterality: Left;   TRIGGER FINGER RELEASE Right 06/30/2014   Procedure: RELEASE A-1 PULLEY RIGHT THUMB;  Surgeon: Daryll Brod, MD;  Location: [facility];  Service: Orthopedics;  Laterality: Right;  ANESTHESIA:  IV REGIONAL FAB   Family History Family History  Problem Relation Age of Onset   Heart Problems Mother        had pacemaker, lived to 23   Alzheimer's disease Father    Prostate cancer Father     Social History Social History   Tobacco Use   Smoking status: Never    Smokeless tobacco: Never  Vaping Use   Vaping Use: Never used  Substance Use Topics   Alcohol use: Yes    Comment: occ. beer   Drug use: No   Allergies Codeine  Review of Systems Review of Systems  HENT:  Positive for sore throat.   Respiratory:  Positive for chest tightness.     Physical Exam Vital Signs  I have reviewed the triage vital signs BP 108/73 (BP Location:  Right Arm)   Pulse 69   Temp 98.4 F (36.9 C) (Oral)   Resp 18   SpO2 94%   Physical Exam Constitutional:      General: He is not in acute distress.    Appearance: Normal appearance.  HENT:     Head: Normocephalic and atraumatic.     Nose: No congestion or rhinorrhea.     Mouth/Throat:     Pharynx: Posterior oropharyngeal erythema present.  Eyes:     General:        Right eye: No discharge.        Left eye: No discharge.     Extraocular Movements: Extraocular movements intact.     Pupils: Pupils are equal, round, and reactive to light.  Cardiovascular:     Rate and Rhythm: Normal rate and regular rhythm.     Heart sounds: No murmur heard. Pulmonary:     Effort: No respiratory distress.     Breath sounds: Wheezing present. No rales.  Abdominal:     General: There is no distension.     Tenderness: There is no abdominal tenderness.  Musculoskeletal:        General: Normal range of motion.     Cervical back: Normal range of motion.  Skin:    General: Skin is warm and dry.  Neurological:     General: No focal deficit present.     Mental Status: He is alert.     ED Results and Treatments Labs (all labs ordered are listed, but only abnormal results are displayed) Labs Reviewed  GROUP A STREP BY PCR  RESP PANEL BY RT-PCR (FLU A&B, COVID) ARPGX2                                                                                                                          Radiology DG Chest 2 View  Result Date: 06/09/2022 CLINICAL DATA:  Sore throat and chest congestion. History of asthma. EXAM:  CHEST - 2 VIEW COMPARISON:  05/08/2022 and older studies. FINDINGS: Cardiac silhouette is normal in size and configuration. No mediastinal or hilar masses. No evidence of adenopathy. Lungs are hyperexpanded. Linear scarring noted at the left lung base. Relative lucency in the upper lobes is consistent with emphysema. No evidence of pneumonia or pulmonary edema. No pleural effusion or pneumothorax. New left shoulder hemiarthroplasty, well aligned. IMPRESSION: 1. No acute cardiopulmonary disease. Electronically Signed   By: Lajean Manes M.D.   On: 06/09/2022 09:57    Pertinent labs & imaging results that were available during my care of the patient were reviewed by me and considered in my medical decision making (see MDM for details).  Medications Ordered in ED Medications  predniSONE (DELTASONE) tablet 60 mg (has no administration in time range)  ipratropium-albuterol (DUONEB) 0.5-2.5 (3) MG/3ML nebulizer solution 3 mL (3 mLs Nebulization Given 06/09/22 0955)  ipratropium-albuterol (DUONEB) 0.5-2.5 (3) MG/3ML nebulizer solution 6 mL (6 mLs Nebulization Given 06/09/22 1009)  Procedures .Critical Care  Performed by: Teressa Lower, MD Authorized by: Teressa Lower, MD   Critical care provider statement:    Critical care time (minutes):  30   Critical care was necessary to treat or prevent imminent or life-threatening deterioration of the following conditions:  Cardiac failure and respiratory failure   Critical care was time spent personally by me on the following activities:  Development of treatment plan with patient or surrogate, discussions with consultants, evaluation of patient's response to treatment, examination of patient, ordering and review of laboratory studies, ordering and review of radiographic studies, ordering and performing treatments and interventions,  pulse oximetry, re-evaluation of patient's condition and review of old charts   (including critical care time)  Medical Decision Making / ED Course   This patient presents to the ED for concern of chest tightness, sore throat, this involves an extensive number of treatment options, and is a complaint that carries with it a high risk of complications and morbidity.  The differential diagnosis includes viral URI, COVID-19, influenza, asthma exacerbation, pneumonia, ACS  MDM: Patient seen in the emergency department for evaluation of multiple complaints as described above.  Physical exam with some mild oropharyngeal erythema and persistent end expiratory wheezing.  COVID and flu are negative.  Strep is negative.  Chest x-ray unremarkable.  Patient given 3 DuoNebs as well as 60 of prednisone and on reevaluation the patient states he feels improved but is persistently dropping his oxygen saturations into the upper 80s and then an ambulatory pulse ox trial was performed in which the patient dropped his oxygen saturations into the low 80s.  At no point did the patient have chest pain or shortness of breath but given failed walk of life, an hour-long continuous albuterol treatment was performed.  On reevaluation, his wheezing is improving, but unfortunately, the patient has gone into atrial flutter and thus additional work-up was obtained including laboratory evaluation.  Laboratory evaluation with some mild hypokalemia which was repleted in the emergency department, BNP elevated at 400.6 and troponin significantly elevated at 204.  Aspirin administered and I spoke with the cardiologist on-call who is recommending medical admission for asthma management and heparin in which they would see him in consultation.  When I spoke to the patient about this plan, he was quite resistant to the idea of hospital admission today and believes the albuterol treatments are likely causing his symptoms.  This is actually not  unreasonable and his elevated troponin very well might be from demand ischemia and likely medication induced atrial flutter.  At time of signout, plan is for fluid resuscitation electrolyte repletion and delta troponin.  If delta troponin stable, it might not be unreasonable to discharge the patient on Eliquis with close cardiology follow-up as the patient is currently rate controlled.  On my reevaluation, his hypoxia appears to have resolved.  Please see provider signout for continuation of work-up.   Additional history obtained:  -External records from outside source obtained and reviewed including: Chart review including previous notes, labs, imaging, consultation notes   Lab Tests: -I ordered, reviewed, and interpreted labs.   The pertinent results include:   Labs Reviewed  GROUP A STREP BY PCR  RESP PANEL BY RT-PCR (FLU A&B, COVID) ARPGX2      EKG   EKG Interpretation  Date/Time:  Sunday June 09 2022 12:25:36 EDT Ventricular Rate:  86 PR Interval:    QRS Duration: 104 QT Interval:  438 QTC Calculation: 524 R Axis:   80 Text  Interpretation: Atrial fibrillation Prolonged QT interval Baseline wander in lead(s) V3 V4 V5 V6 Confirmed by Baylen Dea ([id number]) on 06/09/2022 12:35:09 PM         Imaging Studies ordered: I ordered imaging studies including chest x-ray I independently visualized and interpreted imaging. I agree with the radiologist interpretation   Medicines ordered and prescription drug management: Meds ordered this encounter  Medications   ipratropium-albuterol (DUONEB) 0.5-2.5 (3) MG/3ML nebulizer solution 3 mL   ipratropium-albuterol (DUONEB) 0.5-2.5 (3) MG/3ML nebulizer solution    Frederich Cha S: cabinet override   ipratropium-albuterol (DUONEB) 0.5-2.5 (3) MG/3ML nebulizer solution 6 mL   predniSONE (DELTASONE) tablet 60 mg    -I have reviewed the patients home medicines and have made adjustments as needed  Critical interventions Aspirin,  cardiology consultation, electrolyte repletion, multiple DuoNebs, steroids  Consultations Obtained: I requested consultation with the cardiologist on-call,  and discussed lab and imaging findings as well as pertinent plan - they recommend: Hospital admission to medicine with cardiology consultation   Cardiac Monitoring: The patient was maintained on a cardiac monitor.  I personally viewed and interpreted the cardiac monitored which showed an underlying rhythm of: Atrial flutter  Social Determinants of Health:  Factors impacting patients care include: NSR   Reevaluation: After the interventions noted above, I reevaluated the patient and found that they have :improved  Co morbidities that complicate the patient evaluation  Past Medical History:  Diagnosis Date   Altitude sickness    Asthma    exercise-induced; prn inhaler   AV block, 2nd degree    Bilateral inguinal hernia (BIH) 10/16/2012   Overview:  [facility] -Dr. Brantley Stage.  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  Please see provider signout for continuation of work-up.     Final Clinical Impression(s) / ED Diagnoses Final diagnoses:  None     '@PCDICTATION'$ @    Teressa Lower, MD 06/09/22 [phone]

## 2022-06-09 NOTE — ED Triage Notes (Signed)
3 days of sore throat. Pt does have asthma, stopped taking his advair for few days thinking it may have caused sore throat. Pt also reports his chest feels congested.

## 2022-06-09 NOTE — ED Notes (Signed)
O2 saturation on RA while ambulating around nurses' station =88%

## 2022-06-09 NOTE — Discharge Instructions (Signed)
You are seen in the emergency room today with wheezing.  You developed an abnormal heart rhythm here in the ER, atrial flutter.  We discussed that this can cause some stress on the heart and your troponins are elevated here.  We also discussed that these can sometimes be elevated with heart attack.  We discussed options including admission to the hospital but you are electing to go home, start Eliquis, call your cardiologist first thing tomorrow morning.  Please return to the emergency department and/or call 911 if you develop any discomfort in your chest, unexplained fatigue, neck/arm discomfort, nausea, or unexplained sweating.

## 2022-06-09 NOTE — ED Provider Notes (Signed)
Blood pressure 117/82, pulse 63, temperature 98.4 F (36.9 C), temperature source Oral, resp. rate 18, SpO2 94 %.  Assuming care from Dr. Matilde Sprang.  In short, LABAN OROURKE is a 76 y.o. male with a chief complaint of URI .  Refer to the original H&P for additional details.  The current plan of care is to follow up on repeat troponin.  04:24 PM  Patient's repeat troponin of 206 reviewed.  I went and discussed this with the patient along with his symptoms.  He is very hesitant for the initial plan for admit and cardiology evaluation.  He states he mainly came in for asthma symptoms and suspects that the treatment may have caused his mild elevation in troponins.  He has not had any kind of chest discomfort.  His strong preference is to follow with his cardiologist as an outpatient and plans to call them tomorrow morning.  We did discuss restarting Eliquis, which she has tolerated well in the past, and he is okay with this.  He understands that with any new or suddenly worsening symptoms he needs to return immediately.  We did discuss that with troponin elevations like this it is impossible to know if this is from asthma flare, which she suspects, versus some other kind of heart injury or potential early/missed MI.  He verbalizes understanding in this and tells me that he will return immediately if he develops any new or suddenly worsening symptoms.    Margette Fast, MD 06/09/22 [phone]

## 2022-06-10 NOTE — Progress Notes (Unsigned)
Office Visit    Patient Name: [name] Date of Encounter: 06/10/2022  Primary Care Provider:  Wayland Salinas, MD Primary Cardiologist:  Will Meredith Leeds, MD Primary Electrophysiologist: Will Meredith Leeds, MD  Chief Complaint    David [name] is a 76 y.o. male with PMH of Mobitz type I AVB, DVT following right TKA 2019 who presents today for ED follow-up of new onset atrial flutter.  Past Medical History    Past Medical History:  Diagnosis Date   Altitude sickness    Asthma    exercise-induced; prn inhaler   AV block, 2nd degree    Bilateral inguinal hernia (BIH) 10/16/2012   Overview:  [facility] -Dr. Brantley Stage.  Observation for now 10/2012   Complete heart block ([place]) 04/04/2018   COVID-19    DVT (deep venous thrombosis) ([place]) 08/13/2018   Right peroneal DVT 08/13/18, post right TKA 08/10/18   Insomnia 07/14/2014   Mobitz type 1 second degree atrioventricular block    Nasal polyps    OA (osteoarthritis) 09/24/2012   Osteoarthritis of right knee 08/06/2018   Reactive airway disease 09/24/2012   Rotator cuff tear, right    impingement, AC arthrosis   Past Surgical History:  Procedure Laterality Date   A-FLUTTER ABLATION N/A 02/24/2019   Procedure: A-FLUTTER ABLATION;  Surgeon: Constance Haw, MD;  Location: [place] [facility];  Service: Cardiovascular;  Laterality: N/A;   BACK SURGERY     BIOPSY  07/23/2019   Procedure: BIOPSY;  Surgeon: Carol Ada, MD;  [facility];  Service: Endoscopy;;   CARDIAC CATHETERIZATION     CARPAL TUNNEL RELEASE Bilateral 2021   CATARACT EXTRACTION W/ INTRAOCULAR LENS IMPLANT Right    CATARACT EXTRACTION W/ INTRAOCULAR LENS IMPLANT Left    per patient about two years ago 2019   COLONOSCOPY WITH PROPOFOL N/A 07/23/2019   Procedure: COLONOSCOPY WITH PROPOFOL;  Surgeon: Carol Ada, MD;  [facility];  Service: Endoscopy;  Laterality: N/A;   EYE SURGERY     HERNIA REPAIR     INGUINAL  HERNIA REPAIR Right 05/09/2020   Procedure: OPEN RIGHT INGUINAL HERNIA REPAIR;  Surgeon: [name], MD;  Location: [place];  Service: General;  Laterality: Right;   INSERTION OF MESH Right 05/09/2020   Procedure: INSERTION OF MESH;  Surgeon: [name], MD;  Location: [place];  Service: General;  Laterality: Right;   LEFT HEART CATH AND CORONARY ANGIOGRAPHY N/A 04/06/2018   Procedure: LEFT HEART CATH AND CORONARY ANGIOGRAPHY;  Surgeon: Jettie Booze, MD;  Location: [place] [facility];  Service: Cardiovascular;  Laterality: N/A;   LUMBAR LAMINECTOMY  age 78s   NASAL SINUS SURGERY     POLYPECTOMY  07/23/2019   Procedure: POLYPECTOMY;  Surgeon: Carol Ada, MD;  [facility];  Service: Endoscopy;;   REFRACTIVE SURGERY     ROTATOR CUFF REPAIR Right    SHOULDER ARTHROSCOPY  08/06/2011   Procedure: ARTHROSCOPY SHOULDER;  Surgeon: Cammie Sickle., MD;  Location: [place];  Service: Orthopedics;  Laterality: Right;  with subacromial decompression, arthroscopic subscapularis repair, and rotator cuff repair   TOTAL HIP ARTHROPLASTY Right 10/02/2020   Procedure: RIGHT HIP ARTHROPLASTY ANTERIOR APPROACH;  Surgeon: Frederik Pear, MD;  [facility];  Service: Orthopedics;  Laterality: Right;  3E BED   TOTAL KNEE ARTHROPLASTY Right 08/10/2018   Procedure: TOTAL KNEE ARTHROPLASTY;  Surgeon: Frederik Pear, MD;  [facility];  Service: Orthopedics;  Laterality: Right;   TOTAL SHOULDER ARTHROPLASTY Left 05/16/2022   Procedure: TOTAL SHOULDER ARTHROPLASTY;  Surgeon: Tania Ade, MD;  [facility];  Service: Orthopedics;  Laterality: Left;   TRIGGER FINGER RELEASE Right 06/30/2014   Procedure: RELEASE A-1 PULLEY RIGHT THUMB;  Surgeon: Daryll Brod, MD;  Location: [place];  Service: Orthopedics;  Laterality: Right;  ANESTHESIA:  IV REGIONAL FAB    Allergies  Allergies  Allergen Reactions   Codeine Itching    History of Present  Illness    David [name]  is a 76 year old male with the above mention past medical history who presents today for post ED follow-up of new onset atrial flutter.  He was initially seen during admission in 2019 for shortness of breath and bradycardia.  He was found to have Wenckebach pattern heart rate in 30s.  EP was referred and he was seen by Dr. Curt Bears for evaluation.  2D echo was completed with normal EF 55 to 60% with CTA of the chest was completed that was negative for PE.  Troponins were elevated but flat at 0.48 and patient endorsed, chest pain.  He was taken to the Cath Lab to rule out possible ischemia related to heart block.  LHC performed and coronaries were normal.  He was discharged with event monitor and heart block and atrial flutter was revealed with close follow-up.  He underwent atrial flutter ablation on 02/2019 for typical flutter and was successful.  He was not started on anticoagulation due to CHA2DS2-VASc of 1.  Postprocedure patient remained in 2-1 second-degree AV block.  He underwent ETT to evaluate level of block and to determine if PPM implant is necessary.  He was able to get his heart rate up to the 1 teens with exercise and therefore no indication for PPM was made at that time.  He was last seen by MD Chalmers Cater, PA on 04/2022 for follow-up and surgical clearance of shoulder arthroplasty.  He was doing well during follow-up and granted clearance for procedure.  He presented to the ED at [facility] yesterday with complaint of sore throat and chest congestion.  He reported coughing over the last 3 weeks but denied any chest pain.  During visit patient went into atrial flutter and BNP was obtained and elevated at 400.6 with a troponin elevated at 204.  He was offered admission and further cardiac evaluation but patient declined.  Patient preferred to see cardiology and outpatient and discussion was made to restart Eliquis patient was okay with starting back.  He was later discharged with  precautions to return if worsening symptoms develop.    Since last being seen in the office patient reports***.  Patient denies chest pain, palpitations, dyspnea, PND, orthopnea, nausea, vomiting, dizziness, syncope, edema, weight gain, or early satiety.     ***Notes:  Home Medications    Current Outpatient Medications  Medication Sig Dispense Refill   albuterol (VENTOLIN HFA) 108 (90 Base) MCG/ACT inhaler Inhale 2 puffs into the lungs every 4 (four) hours as needed for wheezing or shortness of breath. 8 g 1   apixaban (ELIQUIS) 5 MG TABS tablet Take 1 tablet (5 mg total) by mouth 2 (two) times daily. 60 tablet 0   fluticasone (FLONASE) 50 MCG/ACT nasal spray Place 1 spray into both nostrils daily. (Patient not taking: Reported on 05/03/2022) 18.2 mL 2   fluticasone-salmeterol (ADVAIR HFA) 230-21 MCG/ACT inhaler Inhale 2 puffs into the lungs 2 (two) times daily. 1 each 11  oxyCODONE-acetaminophen (PERCOCET) 5-325 MG tablet Take 1 tablet by mouth every 4 (four) hours as needed for severe pain. 20 tablet 0   No current facility-administered medications for this visit.     Review of Systems  Please see the history of present illness.    (+)*** (+)***  All other systems reviewed and are otherwise negative except as noted above.  Physical Exam    Wt Readings from Last 3 Encounters:  05/23/22 162 lb 6.4 oz (73.7 kg)  05/16/22 163 lb 14.9 oz (74.4 kg)  05/08/22 164 lb (74.4 kg)   NT:IRWER were no vitals filed for this visit.,There is no height or weight on file to calculate BMI.  Constitutional:      Appearance: Healthy appearance. Not in distress.  Neck:     Vascular: JVD normal.  Pulmonary:     Effort: Pulmonary effort is normal.     Breath sounds: No wheezing. No rales. Diminished in the bases Cardiovascular:     Normal rate. Regular rhythm. Normal S1. Normal S2.      Murmurs: There is no murmur.  Edema:    Peripheral edema absent.  Abdominal:     Palpations: Abdomen  is soft non tender. There is no hepatomegaly.  Skin:    General: Skin is warm and dry.  Neurological:     General: No focal deficit present.     Mental Status: Alert and oriented to person, place and time.     Cranial Nerves: Cranial nerves are intact.  EKG/LABS/Other Studies Reviewed    ECG personally reviewed by me today - ***  Risk Assessment/Calculations:   {Does this patient have ATRIAL FIBRILLATION?[phone]}        Lab Results  Component Value Date   WBC 8.5 06/09/2022   HGB 14.1 06/09/2022   HCT 40.8 06/09/2022   MCV 94.0 06/09/2022   PLT 180 06/09/2022   Lab Results  Component Value Date   CREATININE 1.29 (H) 06/09/2022   BUN 23 06/09/2022   NA 139 06/09/2022   K 3.4 (L) 06/09/2022   CL 102 06/09/2022   CO2 25 06/09/2022   Lab Results  Component Value Date   ALT 12 06/09/2022   AST 20 06/09/2022   ALKPHOS 54 06/09/2022   BILITOT 0.8 06/09/2022   Lab Results  Component Value Date   CHOL 141 04/05/2018   HDL 37 (L) 04/05/2018   LDLCALC 99 04/05/2018   TRIG 27 04/05/2018   CHOLHDL 3.8 04/05/2018    Lab Results  Component Value Date   HGBA1C 5.4 04/05/2018    Assessment & Plan    Typical atrial flutter: -s/p atrial flutter ablation on 02/2019 -Patient went into atrial flutter due to upper respiratory infection on 06/09/2022 -today patient is not on rate control medication  2.  Nonobstructive CAD: - LHC performed in 04/2018 with normal coronaries -Today patient reports***   3. 2:1 AV block: -Today patient is***  4.***      Disposition: Follow-up with Will Meredith Leeds, MD or APP in *** months {Are you ordering a CV Procedure (e.g. stress test, cath, DCCV, TEE, etc)?   Press F2        :[id number]}   Medication Adjustments/Labs and Tests Ordered: Current medicines are reviewed at length with the patient today.  Concerns regarding medicines are outlined above.   Signed, Mable Fill, Marissa Nestle, NP 06/10/2022, 11:07 AM  [facility]  Note:  This document was prepared using Dragon voice recognition software  and may include unintentional dictation errors.

## 2022-06-12 ENCOUNTER — Encounter: Payer: Self-pay | Admitting: Nurse Practitioner

## 2022-06-12 ENCOUNTER — Ambulatory Visit: Payer: Medicare Other | Attending: Nurse Practitioner | Admitting: Nurse Practitioner

## 2022-06-12 VITALS — BP 118/82 | HR 41 | Ht 68.0 in | Wt 163.0 lb

## 2022-06-12 DIAGNOSIS — J069 Acute upper respiratory infection, unspecified: Secondary | ICD-10-CM

## 2022-06-12 DIAGNOSIS — I442 Atrioventricular block, complete: Secondary | ICD-10-CM | POA: Diagnosis not present

## 2022-06-12 DIAGNOSIS — J40 Bronchitis, not specified as acute or chronic: Secondary | ICD-10-CM | POA: Diagnosis not present

## 2022-06-12 DIAGNOSIS — I2510 Atherosclerotic heart disease of native coronary artery without angina pectoris: Secondary | ICD-10-CM

## 2022-06-12 NOTE — Patient Instructions (Addendum)
Medication Instructions:   Your physician recommends that you continue on your current medications as directed. Please refer to the Current Medication list given to you today.   *If you need a refill on your cardiac medications before your next appointment, please call your pharmacy*   Lab Work:  None ordered.  If you have labs (blood work) drawn today and your tests are completely normal, you will receive your results only by: [place] (if you have MyChart) OR A paper copy in the mail If you have any lab test that is abnormal or we need to change your treatment, we will call you to review the results.   Testing/Procedures:  None ordered.   Follow-Up: At [facility], you and your health needs are our priority.  As part of our continuing mission to provide you with exceptional heart care, we have created designated Provider Care Teams.  These Care Teams include your primary Cardiologist (physician) and Advanced Practice Providers (APPs -  Physician Assistants and Nurse Practitioners) who all work together to provide you with the care you need, when you need it.  We recommend signing up for the patient portal called "MyChart".  Sign up information is provided on this After Visit Summary.  MyChart is used to connect with patients for Virtual Visits (Telemedicine).  Patients are able to view lab/test results, encounter notes, upcoming appointments, etc.  Non-urgent messages can be sent to your provider as well.   To learn more about what you can do with MyChart, go to [email or web address].    Your next appointment:   1 month(s)  The format for your next appointment:   In Person  Provider:   You will see one of the following Advanced Practice Providers on your designated Care Team:   Legrand Como "Jonni Sanger" Chalmers Cater, [place]       Important Information About Sugar     Right ear canal is

## 2022-06-20 ENCOUNTER — Ambulatory Visit (INDEPENDENT_AMBULATORY_CARE_PROVIDER_SITE_OTHER): Payer: Medicare Other

## 2022-06-20 VITALS — BP 155/75 | HR 34 | Temp 97.6°F | Resp 18 | Ht 68.0 in | Wt 159.0 lb

## 2022-06-20 DIAGNOSIS — J4550 Severe persistent asthma, uncomplicated: Secondary | ICD-10-CM | POA: Diagnosis not present

## 2022-06-20 MED ORDER — OMALIZUMAB 150 MG/ML ~~LOC~~ SOSY
300.0000 mg | PREFILLED_SYRINGE | Freq: Once | SUBCUTANEOUS | Status: AC
  Administered 2022-06-20: 300 mg via SUBCUTANEOUS

## 2022-06-20 NOTE — Progress Notes (Signed)
Diagnosis: Asthma  Provider:  Marshell Garfinkel MD  Procedure: Injection  Xolair (Omalizumab), Dose: 300 mg, Site: subcutaneous, Number of injections: 2  Post Care: Patient declined observation  Discharge: Condition: Good, Destination: Home . AVS provided to patient.   Performed by:  Adelina Mings, LPN

## 2022-06-24 DIAGNOSIS — M25512 Pain in left shoulder: Secondary | ICD-10-CM | POA: Diagnosis not present

## 2022-06-24 DIAGNOSIS — Z471 Aftercare following joint replacement surgery: Secondary | ICD-10-CM | POA: Diagnosis not present

## 2022-06-24 DIAGNOSIS — Z96612 Presence of left artificial shoulder joint: Secondary | ICD-10-CM | POA: Diagnosis not present

## 2022-06-25 DIAGNOSIS — R531 Weakness: Secondary | ICD-10-CM | POA: Diagnosis not present

## 2022-06-25 DIAGNOSIS — M25612 Stiffness of left shoulder, not elsewhere classified: Secondary | ICD-10-CM | POA: Diagnosis not present

## 2022-06-25 DIAGNOSIS — Z96611 Presence of right artificial shoulder joint: Secondary | ICD-10-CM | POA: Diagnosis not present

## 2022-06-27 DIAGNOSIS — M25612 Stiffness of left shoulder, not elsewhere classified: Secondary | ICD-10-CM | POA: Diagnosis not present

## 2022-06-27 DIAGNOSIS — R531 Weakness: Secondary | ICD-10-CM | POA: Diagnosis not present

## 2022-06-27 DIAGNOSIS — Z96611 Presence of right artificial shoulder joint: Secondary | ICD-10-CM | POA: Diagnosis not present

## 2022-07-02 DIAGNOSIS — M25612 Stiffness of left shoulder, not elsewhere classified: Secondary | ICD-10-CM | POA: Diagnosis not present

## 2022-07-02 DIAGNOSIS — R531 Weakness: Secondary | ICD-10-CM | POA: Diagnosis not present

## 2022-07-02 DIAGNOSIS — Z96611 Presence of right artificial shoulder joint: Secondary | ICD-10-CM | POA: Diagnosis not present

## 2022-07-04 DIAGNOSIS — M25612 Stiffness of left shoulder, not elsewhere classified: Secondary | ICD-10-CM | POA: Diagnosis not present

## 2022-07-04 DIAGNOSIS — Z96611 Presence of right artificial shoulder joint: Secondary | ICD-10-CM | POA: Diagnosis not present

## 2022-07-04 DIAGNOSIS — R531 Weakness: Secondary | ICD-10-CM | POA: Diagnosis not present

## 2022-07-09 DIAGNOSIS — Z96611 Presence of right artificial shoulder joint: Secondary | ICD-10-CM | POA: Diagnosis not present

## 2022-07-09 DIAGNOSIS — R531 Weakness: Secondary | ICD-10-CM | POA: Diagnosis not present

## 2022-07-09 DIAGNOSIS — M25612 Stiffness of left shoulder, not elsewhere classified: Secondary | ICD-10-CM | POA: Diagnosis not present

## 2022-07-11 DIAGNOSIS — Z96611 Presence of right artificial shoulder joint: Secondary | ICD-10-CM | POA: Diagnosis not present

## 2022-07-11 DIAGNOSIS — M25612 Stiffness of left shoulder, not elsewhere classified: Secondary | ICD-10-CM | POA: Diagnosis not present

## 2022-07-11 DIAGNOSIS — R531 Weakness: Secondary | ICD-10-CM | POA: Diagnosis not present

## 2022-07-16 DIAGNOSIS — Z96612 Presence of left artificial shoulder joint: Secondary | ICD-10-CM | POA: Diagnosis not present

## 2022-07-16 DIAGNOSIS — M6281 Muscle weakness (generalized): Secondary | ICD-10-CM | POA: Diagnosis not present

## 2022-07-16 DIAGNOSIS — M25612 Stiffness of left shoulder, not elsewhere classified: Secondary | ICD-10-CM | POA: Diagnosis not present

## 2022-07-18 ENCOUNTER — Ambulatory Visit (INDEPENDENT_AMBULATORY_CARE_PROVIDER_SITE_OTHER): Payer: Medicare Other

## 2022-07-18 VITALS — BP 158/80 | HR 35 | Temp 97.5°F | Resp 18 | Ht 68.0 in | Wt 163.4 lb

## 2022-07-18 DIAGNOSIS — M6281 Muscle weakness (generalized): Secondary | ICD-10-CM | POA: Diagnosis not present

## 2022-07-18 DIAGNOSIS — M25612 Stiffness of left shoulder, not elsewhere classified: Secondary | ICD-10-CM | POA: Diagnosis not present

## 2022-07-18 DIAGNOSIS — Z96612 Presence of left artificial shoulder joint: Secondary | ICD-10-CM | POA: Diagnosis not present

## 2022-07-18 DIAGNOSIS — J4550 Severe persistent asthma, uncomplicated: Secondary | ICD-10-CM

## 2022-07-18 MED ORDER — OMALIZUMAB 150 MG/ML ~~LOC~~ SOSY
300.0000 mg | PREFILLED_SYRINGE | Freq: Once | SUBCUTANEOUS | Status: AC
  Administered 2022-07-18: 300 mg via SUBCUTANEOUS
  Filled 2022-07-18: qty 2

## 2022-07-18 NOTE — Progress Notes (Signed)
Diagnosis: Asthma  Provider:  Marshell Garfinkel MD  Procedure: Injection  Xolair (Omalizumab), Dose: 300 mg, Site: subcutaneous, Number of injections: 2  Post Care:  N/A  Discharge: Condition: Good, Destination: Home . AVS provided to patient.   Performed by:  Cleophus Molt, RN

## 2022-07-23 DIAGNOSIS — M25612 Stiffness of left shoulder, not elsewhere classified: Secondary | ICD-10-CM | POA: Diagnosis not present

## 2022-07-23 DIAGNOSIS — Z96612 Presence of left artificial shoulder joint: Secondary | ICD-10-CM | POA: Diagnosis not present

## 2022-07-23 DIAGNOSIS — M6281 Muscle weakness (generalized): Secondary | ICD-10-CM | POA: Diagnosis not present

## 2022-07-23 NOTE — Progress Notes (Unsigned)
PCP:  Wayland Salinas, MD Primary Cardiologist: Will Meredith Leeds, MD Electrophysiologist: Constance Haw, MD   David [name] is a 76 y.o. male seen today for Will Meredith Leeds, MD for routine electrophysiology followup.   Pt has known intermittent second and CHB, well tolerated with narrow junctional escape and ability to get up to 110s with exercise.   Went into typical appearing flutter s/p URI 06/2022.  Since last being seen in our clinic the patient reports doing ***.  he denies chest pain, palpitations, dyspnea, PND, orthopnea, nausea, vomiting, dizziness, syncope, edema, weight gain, or early satiety.   Past Medical History:  Diagnosis Date   Altitude sickness    Asthma    exercise-induced; prn inhaler   AV block, 2nd degree    Bilateral inguinal hernia (BIH) 10/16/2012   Overview:  [facility] -Dr. Brantley Stage.  Observation for now 10/2012   Complete heart block ([place]) 04/04/2018   COVID-19    DVT (deep venous thrombosis) ([place]) 08/13/2018   Right peroneal DVT 08/13/18, post right TKA 08/10/18   Insomnia 07/14/2014   Mobitz type 1 second degree atrioventricular block    Nasal polyps    OA (osteoarthritis) 09/24/2012   Osteoarthritis of right knee 08/06/2018   Reactive airway disease 09/24/2012   Rotator cuff tear, right    impingement, AC arthrosis   Past Surgical History:  Procedure Laterality Date   A-FLUTTER ABLATION N/A 02/24/2019   Procedure: A-FLUTTER ABLATION;  Surgeon: Constance Haw, MD;  Location: [place] [facility];  Service: Cardiovascular;  Laterality: N/A;   BACK SURGERY     BIOPSY  07/23/2019   Procedure: BIOPSY;  Surgeon: Carol Ada, MD;  [facility];  Service: Endoscopy;;   CARDIAC CATHETERIZATION     CARPAL TUNNEL RELEASE Bilateral 2021   CATARACT EXTRACTION W/ INTRAOCULAR LENS IMPLANT Right    CATARACT EXTRACTION W/ INTRAOCULAR LENS IMPLANT Left    per patient about two years ago 2019   COLONOSCOPY WITH  PROPOFOL N/A 07/23/2019   Procedure: COLONOSCOPY WITH PROPOFOL;  Surgeon: Carol Ada, MD;  [facility];  Service: Endoscopy;  Laterality: N/A;   EYE SURGERY     HERNIA REPAIR     INGUINAL HERNIA REPAIR Right 05/09/2020   Procedure: OPEN RIGHT INGUINAL HERNIA REPAIR;  Surgeon: [name], MD;  Location: [place];  Service: General;  Laterality: Right;   INSERTION OF MESH Right 05/09/2020   Procedure: INSERTION OF MESH;  Surgeon: [name], MD;  Location: [place];  Service: General;  Laterality: Right;   LEFT HEART CATH AND CORONARY ANGIOGRAPHY N/A 04/06/2018   Procedure: LEFT HEART CATH AND CORONARY ANGIOGRAPHY;  Surgeon: Jettie Booze, MD;  Location: [place] [facility];  Service: Cardiovascular;  Laterality: N/A;   LUMBAR LAMINECTOMY  age 8s   NASAL SINUS SURGERY     POLYPECTOMY  07/23/2019   Procedure: POLYPECTOMY;  Surgeon: Carol Ada, MD;  [facility];  Service: Endoscopy;;   REFRACTIVE SURGERY     ROTATOR CUFF REPAIR Right    SHOULDER ARTHROSCOPY  08/06/2011   Procedure: ARTHROSCOPY SHOULDER;  Surgeon: Cammie Sickle., MD;  Location: [place];  Service: Orthopedics;  Laterality: Right;  with subacromial decompression, arthroscopic subscapularis repair, and rotator cuff repair   TOTAL HIP ARTHROPLASTY Right 10/02/2020   Procedure: RIGHT HIP ARTHROPLASTY ANTERIOR APPROACH;  Surgeon: Frederik Pear, MD;  [facility];  Service: Orthopedics;  Laterality: Right;  3E BED  TOTAL KNEE ARTHROPLASTY Right 08/10/2018   Procedure: TOTAL KNEE ARTHROPLASTY;  Surgeon: Frederik Pear, MD;  [facility];  Service: Orthopedics;  Laterality: Right;   TOTAL SHOULDER ARTHROPLASTY Left 05/16/2022   Procedure: TOTAL SHOULDER ARTHROPLASTY;  Surgeon: Tania Ade, MD;  [facility];  Service: Orthopedics;  Laterality: Left;   TRIGGER FINGER RELEASE Right 06/30/2014   Procedure: RELEASE A-1 PULLEY RIGHT THUMB;  Surgeon: Daryll Brod, MD;   Location: [place];  Service: Orthopedics;  Laterality: Right;  ANESTHESIA:  IV REGIONAL FAB    Current Outpatient Medications  Medication Sig Dispense Refill   albuterol (VENTOLIN HFA) 108 (90 Base) MCG/ACT inhaler Inhale 2 puffs into the lungs every 4 (four) hours as needed for wheezing or shortness of breath. 8 g 1   apixaban (ELIQUIS) 5 MG TABS tablet Take 1 tablet (5 mg total) by mouth 2 (two) times daily. 60 tablet 0   fluticasone (FLONASE) 50 MCG/ACT nasal spray Place 1 spray into both nostrils daily. 18.2 mL 2   fluticasone-salmeterol (ADVAIR HFA) 230-21 MCG/ACT inhaler Inhale 2 puffs into the lungs 2 (two) times daily. 1 each 11   No current facility-administered medications for this visit.    Allergies  Allergen Reactions   Codeine Itching    Social History   Socioeconomic History   Marital status: Married    Spouse name: Not on file   Number of children: Not on file   Years of education: Not on file   Highest education level: Not on file  Occupational History   Not on file  Tobacco Use   Smoking status: Never   Smokeless tobacco: Never  Vaping Use   Vaping Use: Never used  Substance and Sexual Activity   Alcohol use: Yes    Comment: occ. beer   Drug use: No   Sexual activity: Not on file  Other Topics Concern   Not on file  Social History Narrative   Not on file   Social Determinants of Health   Financial Resource Strain: Not on file  Food Insecurity: Not on file  Transportation Needs: Not on file  Physical Activity: Not on file  Stress: Not on file  Social Connections: Not on file  Intimate Partner Violence: Not on file     Review of Systems: All other systems reviewed and are otherwise negative except as noted above.  Physical Exam: There were no vitals filed for this visit.  GEN- The patient is well appearing, alert and oriented x 3 today.   HEENT: normocephalic, atraumatic; sclera clear, conjunctiva pink; hearing intact;  oropharynx clear; neck supple, no JVP Lymph- no cervical lymphadenopathy Lungs- Clear to ausculation bilaterally, normal work of breathing.  No wheezes, rales, rhonchi Heart- {Blank single:19197::"Regular","Irregularly irregular"} rate and rhythm, no murmurs, rubs or gallops, PMI not laterally displaced GI- soft, non-tender, non-distended, bowel sounds present, no hepatosplenomegaly Extremities- {EDEMA GDJME:26834} peripheral edema. no clubbing or cyanosis; DP/PT/radial pulses 2+ bilaterally MS- no significant deformity or atrophy Skin- warm and dry, no rash or lesion Psych- euthymic mood, full affect Neuro- strength and sensation are intact  EKG is ordered. Personal review of EKG from today shows ***  Additional studies reviewed include: Previous EP notes.   {Select studies to display:26339}  Assessment and Plan:  1.  Advanced AV block With 2:1 and CHB in the past QRS complex is narrow.  ETT 09/2020 with 1:1 conduction at faster HRs.  CHB today. Gets HR up to 110s with exercise. No indication for  pacemaker at this time given lack of symptoms and narrow QRS.    2.  Typical appearing atrial flutter:  Status post ablation 02/24/2019.   He has not had recurrence and is off of anticoagulation for a CHA2DS2-VASc of 1.  Follow up with {EPMDS:28135} in {EPFOLLOW UP:28173}  Shirley Friar, PA-C  07/23/22 9:14 AM

## 2022-07-24 ENCOUNTER — Encounter: Payer: Self-pay | Admitting: Student

## 2022-07-24 ENCOUNTER — Ambulatory Visit: Payer: Medicare Other | Attending: Student | Admitting: Student

## 2022-07-24 VITALS — BP 162/90 | HR 39 | Ht 68.0 in | Wt 164.8 lb

## 2022-07-24 DIAGNOSIS — I442 Atrioventricular block, complete: Secondary | ICD-10-CM

## 2022-07-24 DIAGNOSIS — I2510 Atherosclerotic heart disease of native coronary artery without angina pectoris: Secondary | ICD-10-CM | POA: Diagnosis not present

## 2022-07-24 DIAGNOSIS — I441 Atrioventricular block, second degree: Secondary | ICD-10-CM

## 2022-07-24 DIAGNOSIS — I483 Typical atrial flutter: Secondary | ICD-10-CM | POA: Diagnosis not present

## 2022-07-24 MED ORDER — APIXABAN 5 MG PO TABS: 5.0000 mg | ORAL_TABLET | Freq: Two times a day (BID) | ORAL | 3 refills | Status: DC

## 2022-07-24 NOTE — Patient Instructions (Signed)
Medication Instructions:  Your physician recommends that you continue on your current medications as directed. Please refer to the Current Medication list given to you today.  *If you need a refill on your cardiac medications before your next appointment, please call your pharmacy*   Lab Work: None If you have labs (blood work) drawn today and your tests are completely normal, you will receive your results only by: [place] (if you have MyChart) OR A paper copy in the mail If you have any lab test that is abnormal or we need to change your treatment, we will call you to review the results.   Follow-Up: At [facility], you and your health needs are our priority.  As part of our continuing mission to provide you with exceptional heart care, we have created designated Provider Care Teams.  These Care Teams include your primary Cardiologist (physician) and Advanced Practice Providers (APPs -  Physician Assistants and Nurse Practitioners) who all work together to provide you with the care you need, when you need it.   Your next appointment:   3 month(s)  The format for your next appointment:   In Person  Provider:   Allegra Lai, MD     Important Information About Sugar

## 2022-07-30 DIAGNOSIS — M6281 Muscle weakness (generalized): Secondary | ICD-10-CM | POA: Diagnosis not present

## 2022-07-30 DIAGNOSIS — M25612 Stiffness of left shoulder, not elsewhere classified: Secondary | ICD-10-CM | POA: Diagnosis not present

## 2022-07-30 DIAGNOSIS — Z96612 Presence of left artificial shoulder joint: Secondary | ICD-10-CM | POA: Diagnosis not present

## 2022-08-01 DIAGNOSIS — R531 Weakness: Secondary | ICD-10-CM | POA: Diagnosis not present

## 2022-08-01 DIAGNOSIS — Z96611 Presence of right artificial shoulder joint: Secondary | ICD-10-CM | POA: Diagnosis not present

## 2022-08-01 DIAGNOSIS — M25612 Stiffness of left shoulder, not elsewhere classified: Secondary | ICD-10-CM | POA: Diagnosis not present

## 2022-08-14 ENCOUNTER — Telehealth: Payer: Self-pay | Admitting: Pharmacist

## 2022-08-14 NOTE — Telephone Encounter (Signed)
Added Xolair to med list today as it appears to not be listed. Receives at [facility].  Dose: '300mg'$  SQ every 28 days  Knox Saliva, PharmD, MPH, BCPS, CPP Clinical Pharmacist (Rheumatology and Pulmonology)

## 2022-08-15 ENCOUNTER — Ambulatory Visit (INDEPENDENT_AMBULATORY_CARE_PROVIDER_SITE_OTHER): Payer: Medicare Other

## 2022-08-15 VITALS — BP 120/85 | HR 35 | Temp 97.4°F | Resp 16 | Ht 68.0 in | Wt 162.0 lb

## 2022-08-15 DIAGNOSIS — J4550 Severe persistent asthma, uncomplicated: Secondary | ICD-10-CM

## 2022-08-15 MED ORDER — OMALIZUMAB 150 MG/ML ~~LOC~~ SOSY
300.0000 mg | PREFILLED_SYRINGE | Freq: Once | SUBCUTANEOUS | Status: AC
  Administered 2022-08-15: 300 mg via SUBCUTANEOUS
  Filled 2022-08-15: qty 2

## 2022-08-15 NOTE — Progress Notes (Signed)
Diagnosis: Asthma  Provider:  Marshell Garfinkel MD  Procedure: Injection  Xolair (Omalizumab), Dose: 300 mg, Site: subcutaneous, Number of injections: 2   Discharge: Condition: Good, Destination: Home . AVS provided to patient.   Performed by:  Koren Shiver, RN

## 2022-09-16 ENCOUNTER — Ambulatory Visit (INDEPENDENT_AMBULATORY_CARE_PROVIDER_SITE_OTHER): Payer: Medicare Other

## 2022-09-16 VITALS — BP 161/98 | HR 41 | Temp 97.6°F | Resp 20 | Ht 68.0 in | Wt 168.4 lb

## 2022-09-16 DIAGNOSIS — J4550 Severe persistent asthma, uncomplicated: Secondary | ICD-10-CM | POA: Diagnosis not present

## 2022-09-16 MED ORDER — OMALIZUMAB 150 MG/ML ~~LOC~~ SOSY
300.0000 mg | PREFILLED_SYRINGE | Freq: Once | SUBCUTANEOUS | Status: AC
  Administered 2022-09-16: 300 mg via SUBCUTANEOUS
  Filled 2022-09-16: qty 2

## 2022-09-16 NOTE — Progress Notes (Signed)
Diagnosis: Asthma  Provider:  Marshell Garfinkel MD  Procedure: Injection  Xolair (Omalizumab), Dose: 300 mg, Site: subcutaneous, Number of injections: 2  Post Care:  n/A  Discharge: Condition: Good, Destination: Home . AVS provided to patient.   Performed by:  Cleophus Molt, RN

## 2022-09-24 DIAGNOSIS — H00011 Hordeolum externum right upper eyelid: Secondary | ICD-10-CM | POA: Diagnosis not present

## 2022-09-24 DIAGNOSIS — H5213 Myopia, bilateral: Secondary | ICD-10-CM | POA: Diagnosis not present

## 2022-09-24 DIAGNOSIS — H52223 Regular astigmatism, bilateral: Secondary | ICD-10-CM | POA: Diagnosis not present

## 2022-10-14 ENCOUNTER — Ambulatory Visit (INDEPENDENT_AMBULATORY_CARE_PROVIDER_SITE_OTHER): Payer: Medicare Other

## 2022-10-14 VITALS — BP 184/86 | HR 34 | Temp 96.6°F | Resp 18 | Ht 68.0 in | Wt 164.2 lb

## 2022-10-14 DIAGNOSIS — J4550 Severe persistent asthma, uncomplicated: Secondary | ICD-10-CM | POA: Diagnosis not present

## 2022-10-14 MED ORDER — OMALIZUMAB 150 MG/ML ~~LOC~~ SOSY
300.0000 mg | PREFILLED_SYRINGE | Freq: Once | SUBCUTANEOUS | Status: AC
  Administered 2022-10-14: 300 mg via SUBCUTANEOUS
  Filled 2022-10-14: qty 2

## 2022-10-14 NOTE — Progress Notes (Signed)
Diagnosis: Asthma  Provider:  Marshell Garfinkel MD  Procedure: Injection  Xolair (Omalizumab), Dose: 300 mg, Site: subcutaneous, Number of injections: 2   Discharge: Condition: Good, Destination: Home . AVS Provided  Performed by:  Adelina Mings, LPN

## 2022-10-29 ENCOUNTER — Encounter: Payer: Self-pay | Admitting: Cardiology

## 2022-10-29 ENCOUNTER — Ambulatory Visit: Payer: Medicare Other | Attending: Cardiology | Admitting: Cardiology

## 2022-10-29 VITALS — BP 128/70 | HR 36 | Ht 68.0 in | Wt 162.0 lb

## 2022-10-29 DIAGNOSIS — I483 Typical atrial flutter: Secondary | ICD-10-CM | POA: Insufficient documentation

## 2022-10-29 DIAGNOSIS — I480 Paroxysmal atrial fibrillation: Secondary | ICD-10-CM | POA: Insufficient documentation

## 2022-10-29 DIAGNOSIS — I442 Atrioventricular block, complete: Secondary | ICD-10-CM | POA: Diagnosis not present

## 2022-10-29 NOTE — Progress Notes (Signed)
Electrophysiology Office Note   Date:  10/29/2022   ID:  David [name], DOB 1946/03/05, MRN [id number]  PCP:  Wayland Salinas, MD  Cardiologist:  Marlou Porch Primary Electrophysiologist:  Phoenix Dresser Meredith Leeds, MD    No chief complaint on file.    History of Present Illness: David [name] is a 77 y.o. male who is being seen today for the evaluation of bradycardia at the request of Ryter-Brown, Shyrl Numbers, *. Presenting today for electrophysiology evaluation.    He presented to the hospital with weight gain and lower extremity edema.  Echo showed a normal ejection fraction left heart catheterization showed no evidence of coronary artery disease.  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  Holter monitor showed similar results.  He had atrial flutter on the monitor and is now status post atrial flutter ablation 02/24/2019.  He had a brief episode of atrial fibrillation after a URI October 2023.  Eliquis was restarted.  In reviewing the notes, it sounds like he went into atrial fibrillation after getting therapy for his asthma.  He has not had any further episodes to his knowledge.  Today, denies symptoms of palpitations, chest pain, shortness of breath, orthopnea, PND, lower extremity edema, claudication, dizziness, presyncope, syncope, bleeding, or neurologic sequela. The patient is tolerating medications without difficulties.  Today he feels well.  He has no chest pain or shortness of breath.  He is able to all of his daily activities.  He does state that the only time that he feels short of breath is when he is exercising and he is having an asthma attack.  When he is not having issues with his asthma, he has no major issues with shortness of breath.   Past Medical History:  Diagnosis Date   Altitude sickness    Asthma    exercise-induced; prn inhaler   AV block, 2nd degree    Bilateral inguinal hernia (BIH) 10/16/2012   Overview:  [facility] -Dr. Brantley Stage.  Observation for now 10/2012   Complete heart block ([place]) 04/04/2018   COVID-19    DVT (deep venous thrombosis) ([place]) 08/13/2018   Right peroneal DVT 08/13/18, post right TKA 08/10/18   Insomnia 07/14/2014   Mobitz type 1 second degree atrioventricular block    Nasal polyps    OA (osteoarthritis) 09/24/2012   Osteoarthritis of right knee 08/06/2018   Reactive airway disease 09/24/2012   Rotator cuff tear, right    impingement, AC arthrosis   Past Surgical History:  Procedure Laterality Date   A-FLUTTER ABLATION N/A 02/24/2019   Procedure: A-FLUTTER ABLATION;  Surgeon: Constance Haw, MD;  Location: [facility];  Service: Cardiovascular;  Laterality: N/A;   BACK SURGERY     BIOPSY  07/23/2019   Procedure: BIOPSY;  Surgeon: Carol Ada, MD;  Location: [facility];  Service: Endoscopy;;   CARDIAC CATHETERIZATION     CARPAL TUNNEL RELEASE Bilateral 2021   CATARACT EXTRACTION W/ INTRAOCULAR LENS IMPLANT Right    CATARACT EXTRACTION W/ INTRAOCULAR LENS IMPLANT Left    per patient about two years ago 2019   COLONOSCOPY WITH PROPOFOL N/A 07/23/2019   Procedure: COLONOSCOPY WITH PROPOFOL;  Surgeon: Carol Ada, MD;  [facility];  Service: Endoscopy;  Laterality: N/A;   EYE SURGERY     HERNIA REPAIR     INGUINAL HERNIA REPAIR Right 05/09/2020   Procedure: OPEN RIGHT INGUINAL HERNIA REPAIR;  Surgeon: Bobbye Morton,  Montel Culver, MD;  Location: [facility];  Service: General;  Laterality: Right;   INSERTION OF MESH Right 05/09/2020   Procedure: INSERTION OF MESH;  Surgeon: [name], MD;  Location: [facility];  Service: General;  Laterality: Right;   LEFT HEART CATH AND CORONARY ANGIOGRAPHY N/A 04/06/2018   Procedure: LEFT HEART CATH AND CORONARY ANGIOGRAPHY;  Surgeon: Jettie Booze, MD;  Location: [facility];  Service: Cardiovascular;  Laterality: N/A;   LUMBAR LAMINECTOMY  age 65s   NASAL SINUS SURGERY     POLYPECTOMY  07/23/2019   Procedure:  POLYPECTOMY;  Surgeon: Carol Ada, MD;  [facility];  Service: Endoscopy;;   REFRACTIVE SURGERY     ROTATOR CUFF REPAIR Right    SHOULDER ARTHROSCOPY  08/06/2011   Procedure: ARTHROSCOPY SHOULDER;  Surgeon: Cammie Sickle., MD;  Location: [facility];  Service: Orthopedics;  Laterality: Right;  with subacromial decompression, arthroscopic subscapularis repair, and rotator cuff repair   TOTAL HIP ARTHROPLASTY Right 10/02/2020   Procedure: RIGHT HIP ARTHROPLASTY ANTERIOR APPROACH;  Surgeon: Frederik Pear, MD;  [facility];  Service: Orthopedics;  Laterality: Right;  3E BED   TOTAL KNEE ARTHROPLASTY Right 08/10/2018   Procedure: TOTAL KNEE ARTHROPLASTY;  Surgeon: Frederik Pear, MD;  [facility];  Service: Orthopedics;  Laterality: Right;   TOTAL SHOULDER ARTHROPLASTY Left 05/16/2022   Procedure: TOTAL SHOULDER ARTHROPLASTY;  Surgeon: Tania Ade, MD;  [facility];  Service: Orthopedics;  Laterality: Left;   TRIGGER FINGER RELEASE Right 06/30/2014   Procedure: RELEASE A-1 PULLEY RIGHT THUMB;  Surgeon: Daryll Brod, MD;  Location: [facility];  Service: Orthopedics;  Laterality: Right;  ANESTHESIA:  IV REGIONAL FAB     Current Outpatient Medications  Medication Sig Dispense Refill   albuterol (VENTOLIN HFA) 108 (90 Base) MCG/ACT inhaler Inhale 2 puffs into the lungs every 4 (four) hours as needed for wheezing or shortness of breath. 8 g 1   apixaban (ELIQUIS) 5 MG TABS tablet Take 1 tablet (5 mg total) by mouth 2 (two) times daily. 180 tablet 3   fluticasone (FLONASE) 50 MCG/ACT nasal spray Place 1 spray into both nostrils daily. 18.2 mL 2   fluticasone-salmeterol (ADVAIR HFA) 230-21 MCG/ACT inhaler Inhale 2 puffs into the lungs 2 (two) times daily. 1 each 11   omalizumab (XOLAIR) 150 MG/ML prefilled syringe Inject 300 mg into the skin every 28 (twenty-eight) days. Received at infusion center     No current facility-administered  medications for this visit.    Allergies:   Codeine   Social History:  The patient  reports that he has never smoked. He has never used smokeless tobacco. He reports current alcohol use. He reports that he does not use drugs.   Family History:  The patient's family history includes Alzheimer's disease in his father; Heart Problems in his mother; Prostate cancer in his father.   ROS:  Please see the history of present illness.   Otherwise, review of systems is positive for none.   All other systems are reviewed and negative.   PHYSICAL EXAM: VS:  BP 128/70   Pulse (!) 36   Ht 5' 8"$  (1.727 m)   Wt 162 lb (73.5 kg)   SpO2 97%   BMI 24.63 kg/m  , BMI Body mass index is 24.63 kg/m. GEN: Well nourished, well developed, in no acute distress  HEENT: normal  Neck: no JVD, carotid bruits, or masses Cardiac: Regular, bradycardic; no murmurs,  rubs, or gallops,no edema  Respiratory:  clear to auscultation bilaterally, normal work of breathing GI: soft, nontender, nondistended, + BS MS: no deformity or atrophy  Skin: warm and dry Neuro:  Strength and sensation are intact Psych: euthymic mood, full affect  EKG:  EKG is ordered today. Personal review of the ekg ordered  shows sinus rhythm, complete heart block, junctional escape  Recent Labs: 06/09/2022: ALT 12; B Natriuretic Peptide 400.6; BUN 23; Creatinine, Ser 1.29; Hemoglobin 14.1; Platelets 180; Potassium 3.4; Sodium 139    Lipid Panel     Component Value Date/Time   CHOL 141 04/05/2018 0019   TRIG 27 04/05/2018 0019   HDL 37 (L) 04/05/2018 0019   CHOLHDL 3.8 04/05/2018 0019   VLDL 5 04/05/2018 0019   LDLCALC 99 04/05/2018 0019     Wt Readings from Last 3 Encounters:  10/29/22 162 lb (73.5 kg)  10/14/22 164 lb 3.2 oz (74.5 kg)  09/16/22 168 lb 6.4 oz (76.4 kg)      Other studies Reviewed: Additional studies/ records that were reviewed today include: LHC 04/06/18  Review of the above records today demonstrates:  The left  ventricular systolic function is normal. LV end diastolic pressure is normal. The left ventricular ejection fraction is 55-65% by visual estimate. There is no aortic valve stenosis. No significant CAD.  TTE 04/05/18 - Left ventricle: The cavity size was normal. Wall thickness was   normal. Systolic function was normal. The estimated ejection   fraction was in the range of 55% to 60%. Wall motion was normal;   there were no regional wall motion abnormalities. - Aortic valve: Trileaflet; mildly thickened, mildly calcified   leaflets. - Right ventricle: The cavity size was mildly dilated. Wall   thickness was normal. - Tricuspid valve: There was trivial regurgitation.  Holter 04/20/18 - personally reviewed Minimum HR: 28 BPM at 7:36:27 AM(2) Maximum HR: 111 BPM at 9:36:54 AM(2) Average HR: 53 BPM 1% PVCs Sinus rhythm with mobitz I block, 2:1 AV block, and complete heart block during waking hours with narrow complex QRS  ASSESSMENT AND PLAN:  1.  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  Able to get heart rates into the 120s.  He wishes to avoid pacemaker implant.  Continues to have episodes of complete heart block with a junctional escape.  He feels well and is able to exercise.  If he has a change in his exercise tolerance, pacemaker implant would be indicated.  For now, we Jeannelle Wiens continue to monitor.  2.  Typical atrial flutter: Status post ablation 02/24/2019.  No obvious recurrence.  3.  Paroxysmal atrial fibrillation: Occurred with a URI 06/09/2022.  CHA2DS2-VASc of 3.  Currently on Eliquis.  The patient would like to come off of his Eliquis.  He did have atrial fibrillation, but this was in the setting of receiving therapy for asthma.  Due to that, we Ishaaq Penna stop his Eliquis today.  He understands that if he has more episodes of atrial fibrillation, anticoagulation would be necessary.  Current medicines are reviewed at length with the patient today.   The patient does not have concerns regarding  his medicines.  The following changes were made today: Stop Eliquis  Labs/ tests ordered today include:  Orders Placed This Encounter  Procedures   EKG 12-Lead    Disposition:   FU 6 months  Signed, Kailen Name Meredith Leeds, MD  10/29/2022 11:38 AM     [address] [place] [address] (  [phone] (office) [phone] (fax)

## 2022-10-30 ENCOUNTER — Ambulatory Visit (INDEPENDENT_AMBULATORY_CARE_PROVIDER_SITE_OTHER): Payer: Medicare Other | Admitting: Pulmonary Disease

## 2022-10-30 ENCOUNTER — Encounter (HOSPITAL_BASED_OUTPATIENT_CLINIC_OR_DEPARTMENT_OTHER): Payer: Self-pay | Admitting: Pulmonary Disease

## 2022-10-30 VITALS — BP 118/70 | HR 36 | Ht 68.0 in | Wt 162.5 lb

## 2022-10-30 DIAGNOSIS — J4551 Severe persistent asthma with (acute) exacerbation: Secondary | ICD-10-CM | POA: Diagnosis not present

## 2022-10-30 MED ORDER — FLUTICASONE-SALMETEROL 230-21 MCG/ACT IN AERO: 2.0000 | INHALATION_SPRAY | Freq: Two times a day (BID) | RESPIRATORY_TRACT | 6 refills | Status: DC

## 2022-10-30 MED ORDER — METHYLPREDNISOLONE ACETATE 80 MG/ML IJ SUSP
80.0000 mg | Freq: Once | INTRAMUSCULAR | Status: AC
  Administered 2022-10-30: 80 mg via INTRAMUSCULAR

## 2022-10-30 MED ORDER — PREDNISONE 10 MG PO TABS: ORAL_TABLET | ORAL | 0 refills | Status: AC

## 2022-10-30 NOTE — Patient Instructions (Signed)
Asthma exacerbation --Steroid shot in-clinic --Prednisone taper ordered --CONTINUE Advair 230-21 mcg 1-2 puffs TWICE a day --CONTINUE Xolair  Follow-up with me in 6 months or sooner if needed

## 2022-10-30 NOTE — Progress Notes (Signed)
Synopsis: Referred in January 2021 for Asthma. Former patient of Dr. Algie Coffer. [name] Subjective:   PATIENT ID: David [name] GENDER: male DOB: 1946/02/15, MRN: [id number]   HPI  Chief Complaint  Patient presents with   Follow-up    Breathing hasn't been doing to good    David [name] is a 77 year old male never smoker with severe persistent asthma who presents for follow-up.  Synopsis: He started omalizumab 11/29/19 with significant improvement in his symptoms and has stop using his inhaler on a daily basis. He is using Trelegy ellipta as needed, maybe a couple times per month.  He was diagnosed with asthma 20 years ago. He was has been on Advair, Symbicort and Trelegy in the past. He was started on Xolair in 2021 however he feels recently it has been less effective. Did not tolerate Symbicort due to cough during inhalation. Trelegy has been starting become less effective as well. He currently has coughing, wheezing and shortness of breath and awakening at night. He is currently on prednisone and symptoms will recur when he is off steroids. He has required steroid tapers six times in the last year.  01/15/22 Since our last visit inhalers were changed to medium dose Advair HFA. We discussed transitioning to [place] on telephone encounters however he has had improved symptoms on Xolair so decided to continue this biologic. He reports he is overall doing well on Xolair and Advair. Not needing albuterol inhaler. Denies shortness of breath, wheezing or coughing. No nocturnal symptoms.  04/29/22 He is planning for left total shoulder arthroplasty on 05/16/2022. Last exacerbation in 10/2021. Currently on Advair ONE puff TWICE a day due to a cough/sore throat. Reports symptoms are overall well controlled. No nocturnal symptoms. On Xolair. Does report mild cough that usually leads to worsening respiratory symptoms if not addressed.  10/30/22 Since our last visit he was seen in [facility] ED for chest  tightness and sore throat. He was treated with duonebs and prednisone 60 mg. Had hypoxemia to upper 80s. C/b atrial flutter. Was started on anticoagulation by ED but this was determined not to continue by his cardiologist. He had paused Advair use for several months. In December he had a URI.   Asthma Control Test ACT Total Score  10/30/2022 10:30 AM 12  04/29/2022  4:09 PM 20  01/15/2022  9:14 AM 24    Past Medical History:  Diagnosis Date   Altitude sickness    Asthma    exercise-induced; prn inhaler   AV block, 2nd degree    Bilateral inguinal hernia (BIH) 10/16/2012   Overview:  [facility] -Dr. Brantley Stage.  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   COVID-19    DVT (deep venous thrombosis) ([place]) 08/13/2018   Right peroneal DVT 08/13/18, post right TKA 08/10/18   Insomnia 07/14/2014   Mobitz type 1 second degree atrioventricular block    Nasal polyps    OA (osteoarthritis) 09/24/2012   Osteoarthritis of right knee 08/06/2018   Reactive airway disease 09/24/2012   Rotator cuff tear, right    impingement, AC arthrosis     Allergies  Allergen Reactions   Codeine Itching     Outpatient Medications Prior to Visit  Medication Sig Dispense Refill   albuterol (VENTOLIN HFA) 108 (90 Base) MCG/ACT inhaler Inhale 2 puffs into the lungs every 4 (four) hours as needed for wheezing or shortness of breath. 8 g 1   fluticasone (FLONASE) 50 MCG/ACT nasal spray  Place 1 spray into both nostrils daily. 18.2 mL 2   omalizumab (XOLAIR) 150 MG/ML prefilled syringe Inject 300 mg into the skin every 28 (twenty-eight) days. Received at infusion center     fluticasone-salmeterol (ADVAIR HFA) 230-21 MCG/ACT inhaler Inhale 2 puffs into the lungs 2 (two) times daily. 1 each 11   apixaban (ELIQUIS) 5 MG TABS tablet Take 1 tablet (5 mg total) by mouth 2 (two) times daily. (Patient not taking: Reported on 10/30/2022) 180 tablet 3   No facility-administered medications prior  to visit.    Review of Systems  Constitutional:  Negative for chills, diaphoresis, fever, malaise/fatigue and weight loss.  HENT:  Negative for congestion.   Respiratory:  Positive for wheezing. Negative for cough, hemoptysis, sputum production and shortness of breath.   Cardiovascular:  Negative for chest pain, palpitations and leg swelling.    Objective:   Vitals:   10/30/22 1022  BP: 118/70  Pulse: (!) 36  SpO2: 98%  Weight: 162 lb 7.7 oz (73.7 kg)  Height: '5\' 8"'$  (1.727 m)   Physical Exam: General: Well-appearing, no acute distress HENT: Why, AT Eyes: EOMI, no scleral icterus Respiratory: Mild expiratory wheezing Cardiovascular: Bradycardic, RR, -M/R/G, no JVD Extremities:-Edema,-tenderness Neuro: AAO x4, CNII-XII grossly intact Psych: Normal mood, normal affect  Data Reviewed  Chest imaging: CTA 04/05/18 - No PE. Minimal bibasilar atelectasis. CXR 09/11/20 - lingular scar. Cardiomegaly. CXR 10/24/21 - Linular scar. Unchanged cardiomegaly  PFT  10/18/19 FVC 3.73 (93%) FEV1 2.46 (84%) Ratio 62 TLC 104% DLCO 111% Interpretation: On bronchodilators (Spiriva). Mild obstructive defect. No significant bronchodilator response however does not preclude benefit of therapy.  Labs IgE  09/29/19 -137 03/22/20 - 297 10/18/21 - 602    Assessment & Plan:   Severe persistent asthma with acute exacerbation - Plan: methylPREDNISolone acetate (DEPO-MEDROL) injection 80 mg  Discussion: 77 year old never smoker with severe persistent asthma on Xolair who presents for follow-up. Currently in mild exacerbation. Would likely benefit from maintenance therapy but patient prefers intermittent ICS/LABA. More consistent with inhalers during exacerbations. Last exacerbation 10/2021 and 04/2022 and 08/2022. Discussed clinical course and management of asthma including bronchodilator regimen and action plan for exacerbation.  Previously failed Symbicort (cough) and Trelegy (ineffective).   Asthma  exacerbation Mild persistent asthma --Steroid shot in-clinic --Prednisone taper ordered --CONTINUE Advair 230-21 mcg 1-2 puffs TWICE a day --CONTINUE Xolair  Return in about 6 months (around 04/30/2023).   I have spent a total time of 32-minutes on the day of the appointment including chart review, data review, collecting history, coordinating care and discussing medical diagnosis and plan with the patient/family. Past medical history, allergies, medications were reviewed. Pertinent imaging, labs and tests included in this note have been reviewed and interpreted independently by me.  Rodman Pickle, MD [facility] Office: [phone]   Current Outpatient Medications:    albuterol (VENTOLIN HFA) 108 (90 Base) MCG/ACT inhaler, Inhale 2 puffs into the lungs every 4 (four) hours as needed for wheezing or shortness of breath., Disp: 8 g, Rfl: 1   fluticasone (FLONASE) 50 MCG/ACT nasal spray, Place 1 spray into both nostrils daily., Disp: 18.2 mL, Rfl: 2   omalizumab (XOLAIR) 150 MG/ML prefilled syringe, Inject 300 mg into the skin every 28 (twenty-eight) days. Received at infusion center, Disp: , Rfl:    predniSONE (DELTASONE) 10 MG tablet, Take 4 tablets (40 mg total) by mouth daily with breakfast for 2 days, THEN 3 tablets (30 mg total) daily with breakfast  for 2 days, THEN 2 tablets (20 mg total) daily with breakfast for 2 days, THEN 1 tablet (10 mg total) daily with breakfast for 2 days., Disp: 20 tablet, Rfl: 0   apixaban (ELIQUIS) 5 MG TABS tablet, Take 1 tablet (5 mg total) by mouth 2 (two) times daily. (Patient not taking: Reported on 10/30/2022), Disp: 180 tablet, Rfl: 3   fluticasone-salmeterol (ADVAIR HFA) 230-21 MCG/ACT inhaler, Inhale 2 puffs into the lungs 2 (two) times daily., Disp: 1 each, Rfl: 6

## 2022-11-11 ENCOUNTER — Ambulatory Visit (INDEPENDENT_AMBULATORY_CARE_PROVIDER_SITE_OTHER): Payer: Medicare Other | Admitting: *Deleted

## 2022-11-11 VITALS — BP 157/86 | HR 39 | Temp 97.4°F | Resp 16 | Ht 68.0 in | Wt 163.0 lb

## 2022-11-11 DIAGNOSIS — J4550 Severe persistent asthma, uncomplicated: Secondary | ICD-10-CM | POA: Diagnosis not present

## 2022-11-11 MED ORDER — OMALIZUMAB 150 MG/ML ~~LOC~~ SOSY
300.0000 mg | PREFILLED_SYRINGE | Freq: Once | SUBCUTANEOUS | Status: AC
  Administered 2022-11-11: 300 mg via SUBCUTANEOUS

## 2022-11-11 NOTE — Progress Notes (Signed)
Diagnosis: Asthma  Provider:  Marshell Garfinkel MD  Procedure: Injection  Xolair (Omalizumab), Dose: 300 mg, Site: subcutaneous, Number of injections: 2  Post Care: Observation period completed  Discharge: Condition: Good, Destination: Home . AVS Provided and AVS Declined  Performed by:  Oren Beckmann, RN

## 2022-12-09 ENCOUNTER — Ambulatory Visit (INDEPENDENT_AMBULATORY_CARE_PROVIDER_SITE_OTHER): Payer: Medicare Other

## 2022-12-09 VITALS — BP 164/82 | HR 40 | Temp 97.8°F | Resp 18 | Ht 68.0 in | Wt 160.0 lb

## 2022-12-09 DIAGNOSIS — J4550 Severe persistent asthma, uncomplicated: Secondary | ICD-10-CM

## 2022-12-09 MED ORDER — OMALIZUMAB 150 MG/ML ~~LOC~~ SOSY
300.0000 mg | PREFILLED_SYRINGE | Freq: Once | SUBCUTANEOUS | Status: AC
  Administered 2022-12-09: 300 mg via SUBCUTANEOUS
  Filled 2022-12-09: qty 2

## 2022-12-09 NOTE — Progress Notes (Signed)
Diagnosis: Asthma  Provider:  Chilton Greathouse MD  Procedure: Injection  Xolair (Omalizumab), Dose: 300 mg, Site: subcutaneous, Number of injections: 2  Post Care:  NA  Discharge: Condition: Good, Destination: Home . AVS Declined  Performed by:  Garnette Czech, RN

## 2022-12-17 DIAGNOSIS — R040 Epistaxis: Secondary | ICD-10-CM | POA: Diagnosis not present

## 2022-12-17 DIAGNOSIS — J324 Chronic pansinusitis: Secondary | ICD-10-CM | POA: Diagnosis not present

## 2023-01-06 ENCOUNTER — Ambulatory Visit (INDEPENDENT_AMBULATORY_CARE_PROVIDER_SITE_OTHER): Payer: Medicare Other

## 2023-01-06 VITALS — BP 153/93 | HR 52 | Temp 97.6°F | Resp 16 | Ht 68.0 in | Wt 163.6 lb

## 2023-01-06 DIAGNOSIS — J4550 Severe persistent asthma, uncomplicated: Secondary | ICD-10-CM

## 2023-01-06 MED ORDER — OMALIZUMAB 150 MG/ML ~~LOC~~ SOSY
300.0000 mg | PREFILLED_SYRINGE | Freq: Once | SUBCUTANEOUS | Status: AC
  Administered 2023-01-06: 300 mg via SUBCUTANEOUS

## 2023-01-06 NOTE — Progress Notes (Signed)
Diagnosis: Severe persistent  Asthma  Provider:  Chilton Greathouse MD  Procedure: Injection  Xolair (Omalizumab), Dose: 300 mg, Site: subcutaneous, Number of injections: 2  150mg  left arm     150mg  right arm    Post Care: Patient declined observation  Discharge: Condition: Good, Destination: Home . AVS Declined  Performed by:  Marlow Baars [place], RN

## 2023-02-03 ENCOUNTER — Ambulatory Visit (INDEPENDENT_AMBULATORY_CARE_PROVIDER_SITE_OTHER): Payer: Medicare Other | Admitting: *Deleted

## 2023-02-03 VITALS — BP 157/88 | HR 54 | Temp 97.3°F | Resp 16 | Ht 68.0 in | Wt 160.0 lb

## 2023-02-03 DIAGNOSIS — J4550 Severe persistent asthma, uncomplicated: Secondary | ICD-10-CM | POA: Diagnosis not present

## 2023-02-03 MED ORDER — OMALIZUMAB 150 MG/ML ~~LOC~~ SOSY
300.0000 mg | PREFILLED_SYRINGE | Freq: Once | SUBCUTANEOUS | Status: AC
  Administered 2023-02-03: 300 mg via SUBCUTANEOUS

## 2023-02-03 NOTE — Progress Notes (Signed)
Diagnosis: Asthma  Provider:  Chilton Greathouse MD  Procedure: Injection  Xolair (Omalizumab), Dose: 300 mg, Site: subcutaneous, Number of injections: 2  Post Care: Observation period completed  Discharge: Condition: Good, Destination: Home . AVS Declined  Performed by:  Forrest Moron, RN

## 2023-02-06 ENCOUNTER — Other Ambulatory Visit (HOSPITAL_BASED_OUTPATIENT_CLINIC_OR_DEPARTMENT_OTHER): Payer: Self-pay | Admitting: Pulmonary Disease

## 2023-02-07 ENCOUNTER — Telehealth: Payer: Self-pay | Admitting: Cardiology

## 2023-02-07 ENCOUNTER — Encounter (HOSPITAL_BASED_OUTPATIENT_CLINIC_OR_DEPARTMENT_OTHER): Payer: Self-pay | Admitting: Pulmonary Disease

## 2023-02-07 NOTE — Telephone Encounter (Signed)
  Pt c/o Shortness Of Breath: STAT if SOB developed within the last 24 hours or pt is noticeably SOB on the phone  1. Are you currently SOB (can you hear that pt is SOB on the phone)? No   2. How long have you been experiencing SOB?   3. Are you SOB when sitting or when up moving around?   4. Are you currently experiencing any other symptoms?   Pt send a message through pt schedule, his message;  I am presently having shortness of breath issues. This is one of the symptoms Dr Elberta Fortis said would warrant my needing a pacemaker. I think I may need to be evaluated for that.

## 2023-02-07 NOTE — Telephone Encounter (Signed)
Reviewed chart. Left detailed message that Dr. Elberta Fortis is not working in office today and his nurse is off.  They will both return next week.  Adv will forward the message to them for review and then he will be contacted with plan/recommendations.    I will forward to Dr. Gershon Crane scheduler to try to get him an appointment soon as well.

## 2023-02-08 ENCOUNTER — Emergency Department (HOSPITAL_BASED_OUTPATIENT_CLINIC_OR_DEPARTMENT_OTHER): Payer: Medicare Other | Admitting: Radiology

## 2023-02-08 ENCOUNTER — Encounter (HOSPITAL_BASED_OUTPATIENT_CLINIC_OR_DEPARTMENT_OTHER): Payer: Self-pay

## 2023-02-08 ENCOUNTER — Emergency Department (HOSPITAL_BASED_OUTPATIENT_CLINIC_OR_DEPARTMENT_OTHER)
Admission: EM | Admit: 2023-02-08 | Discharge: 2023-02-08 | Disposition: A | Discharge status: Home / Self Care | Payer: Medicare Other | Attending: Emergency Medicine | Admitting: Emergency Medicine

## 2023-02-08 DIAGNOSIS — I459 Conduction disorder, unspecified: Secondary | ICD-10-CM | POA: Diagnosis not present

## 2023-02-08 DIAGNOSIS — R062 Wheezing: Secondary | ICD-10-CM | POA: Diagnosis not present

## 2023-02-08 DIAGNOSIS — J45901 Unspecified asthma with (acute) exacerbation: Secondary | ICD-10-CM

## 2023-02-08 DIAGNOSIS — R0602 Shortness of breath: Secondary | ICD-10-CM | POA: Diagnosis not present

## 2023-02-08 DIAGNOSIS — Z1152 Encounter for screening for COVID-19: Secondary | ICD-10-CM | POA: Diagnosis not present

## 2023-02-08 DIAGNOSIS — R059 Cough, unspecified: Secondary | ICD-10-CM | POA: Diagnosis not present

## 2023-02-08 DIAGNOSIS — Z7901 Long term (current) use of anticoagulants: Secondary | ICD-10-CM | POA: Insufficient documentation

## 2023-02-08 LAB — TROPONIN I (HIGH SENSITIVITY): Troponin I (High Sensitivity): 133 ng/L (ref <18)

## 2023-02-08 LAB — I-STAT VENOUS BLOOD GAS, ED
Acid-Base Excess: 4 mmol/L — ABNORMAL HIGH (ref 0.0–2.0)
Bicarbonate: 29.8 mmol/L — ABNORMAL HIGH (ref 20.0–28.0)
Calcium, Ion: 1.24 mmol/L (ref 1.15–1.40)
HCT: 47 % (ref 39.0–52.0)
Hemoglobin: 16 g/dL (ref 13.0–17.0)
O2 Saturation: 88 %
Patient temperature: 97.5
Potassium: 4.1 mmol/L (ref 3.5–5.1)
Sodium: 139 mmol/L (ref 135–145)
TCO2: 31 mmol/L (ref 22–32)
pCO2, Ven: 46.2 mmHg (ref 44–60)
pH, Ven: 7.415 (ref 7.25–7.43)
pO2, Ven: 52 mmHg — ABNORMAL HIGH (ref 32–45)

## 2023-02-08 LAB — RESP PANEL BY RT-PCR (RSV, FLU A&B, COVID)  RVPGX2
Influenza A by PCR: NEGATIVE
Influenza B by PCR: NEGATIVE
Resp Syncytial Virus by PCR: NEGATIVE
SARS Coronavirus 2 by RT PCR: NEGATIVE

## 2023-02-08 LAB — CBC WITH DIFFERENTIAL/PLATELET
Abs Immature Granulocytes: 0.01 10*3/uL (ref 0.00–0.07)
Basophils Absolute: 0.1 10*3/uL (ref 0.0–0.1)
Basophils Relative: 1 %
Eosinophils Absolute: 0.9 10*3/uL — ABNORMAL HIGH (ref 0.0–0.5)
Eosinophils Relative: 15 %
HCT: 45.2 % (ref 39.0–52.0)
Hemoglobin: 15.6 g/dL (ref 13.0–17.0)
Immature Granulocytes: 0 %
Lymphocytes Relative: 22 %
Lymphs Abs: 1.4 10*3/uL (ref 0.7–4.0)
MCH: 32.2 pg (ref 26.0–34.0)
MCHC: 34.5 g/dL (ref 30.0–36.0)
MCV: 93.4 fL (ref 80.0–100.0)
Monocytes Absolute: 0.4 10*3/uL (ref 0.1–1.0)
Monocytes Relative: 7 %
Neutro Abs: 3.5 10*3/uL (ref 1.7–7.7)
Neutrophils Relative %: 55 %
Platelets: 224 10*3/uL (ref 150–400)
RBC: 4.84 MIL/uL (ref 4.22–5.81)
RDW: 14.1 % (ref 11.5–15.5)
WBC: 6.3 10*3/uL (ref 4.0–10.5)
nRBC: 0 % (ref 0.0–0.2)

## 2023-02-08 LAB — BASIC METABOLIC PANEL
Anion gap: 6 (ref 5–15)
BUN: 21 mg/dL (ref 8–23)
CO2: 30 mmol/L (ref 22–32)
Calcium: 9.6 mg/dL (ref 8.9–10.3)
Chloride: 103 mmol/L (ref 98–111)
Creatinine, Ser: 1.1 mg/dL (ref 0.61–1.24)
GFR, Estimated: >60 mL/min (ref >60)
Glucose, Bld: 121 mg/dL — ABNORMAL HIGH (ref 70–99)
Potassium: 4.1 mmol/L (ref 3.5–5.1)
Sodium: 139 mmol/L (ref 135–145)

## 2023-02-08 LAB — BRAIN NATRIURETIC PEPTIDE: B Natriuretic Peptide: 152.1 pg/mL — ABNORMAL HIGH (ref 0.0–100.0)

## 2023-02-08 MED ORDER — DEXAMETHASONE SODIUM PHOSPHATE 10 MG/ML IJ SOLN
10.0000 mg | Freq: Once | INTRAMUSCULAR | Status: AC
  Administered 2023-02-08: 10 mg via INTRAVENOUS
  Filled 2023-02-08: qty 1

## 2023-02-08 MED ORDER — PREDNISONE 20 MG PO TABS: ORAL_TABLET | ORAL | 0 refills | Status: AC

## 2023-02-08 MED ORDER — IPRATROPIUM-ALBUTEROL 0.5-2.5 (3) MG/3ML IN SOLN: 3.0000 mL | Freq: Once | RESPIRATORY_TRACT | Status: DC

## 2023-02-08 MED ORDER — MAGNESIUM SULFATE 2 GM/50ML IV SOLN
2.0000 g | Freq: Once | INTRAVENOUS | Status: AC
  Administered 2023-02-08: 2 g via INTRAVENOUS
  Filled 2023-02-08: qty 50

## 2023-02-08 NOTE — ED Triage Notes (Signed)
He c/o "wheezing and short of breath". He tells me his pulmonologist prescribed oral steroids about a month ago "that helped for a little while". He tells me "The shot you guys give here knocks it out, I wonder if we could do that". He is met upon arrival to [facility]. By our Vivianne Master. He is ambulatory, moderately short of breath, but is able to complete sentences and is in no distress.

## 2023-02-08 NOTE — ED Notes (Signed)
CRITICAL VALUE STICKER  CRITICAL VALUE:Troponin 133  RECEIVER (on-site recipient of call):Carmon Ginsberg, RN  DATE & TIME NOTIFIED:   MESSENGER (representative from lab):  MD NOTIFIED: Dr. Elpidio Anis  TIME OF NOTIFICATION:1254  RESPONSE:

## 2023-02-08 NOTE — Discharge Instructions (Signed)
I have given you a course of steroids to take for asthma exacerbation.  Continue to use an albuterol inhaler every 4-6 hours as needed for wheezing or shortness of breath.  I am concerned as we discussed about your history of heart block and heart block here.  I advise you call your cardiologist as soon as possible to arrange for pacemaker placement.  If you have any chest pain, worsening shortness of breath, fainting episodes, you need to call 911 or come back to the ER immediately.

## 2023-02-08 NOTE — ED Notes (Signed)
David [name] ambulated around the nurses' station.  O2 sat=92% HR=52.  Pt. Is NOT short of breath.

## 2023-02-08 NOTE — ED Notes (Signed)
David [name] does not want to take Albuterol due to "heart block".  He does have inspiratory and expiratory wheezing, but he says that the Albuterol puts him in Afib.

## 2023-02-08 NOTE — ED Provider Notes (Signed)
[place] EMERGENCY DEPARTMENT AT [facility] Provider Note   CSN: [id number] Arrival date & time: 02/08/23  1001     History  Chief Complaint  Patient presents with   Shortness of Breath    David [name] is a 77 y.o. male.  With PMH of reactive airway disease/asthma, complete heart block, paroxysmal A-fib no longer on anticoagulation who presents with wheezing and shortness of breath.  Patient here mainly with complaints of wheezing and cough.  He says he has chronic ongoing asthma followed closely by pulmonology.  He gets monthly shots but stopped using the inhalers they gave him because he has too many side effects and feels unwell.  He says last time he came into the ER for an asthma exacerbation he was given too much albuterol and nebulizer treatments to put him into A-fib RVR.  He does not want to do any breathing treatments again.  He can ask for a shot of steroids because the steroids always make him feel better.  He had a home taper of steroids that his pulmonologist gave him just in case he felt like his asthma was acting up which he took a few weeks ago and felt better but now he feels like his symptoms are returning again.  He does feel more short of breath with exertion.  He has had no syncopal episodes, no chest pain, no fevers, no chills, no productive cough, no leg pain or swelling.  Of note, he has a chronic history of complete heart block followed by Dr. Elberta Fortis.  He has been told he is a good candidate for pacemaker placement but he has been hesitant to get because he otherwise feels well.  He has had no syncopal episodes and endorses that his heart rate increases whenever he exercises and was told by his cardiologist that this was a good sign and indicated his heart was compensating.   Shortness of Breath      Home Medications Prior to Admission medications   Medication Sig Start Date End Date Taking? Authorizing Provider  predniSONE (DELTASONE) 20 MG tablet  Take 2.5 tablets (50 mg total) by mouth daily for 2 days, THEN 2 tablets (40 mg total) daily for 2 days, THEN 1.5 tablets (30 mg total) daily for 2 days, THEN 1 tablet (20 mg total) daily for 2 days, THEN 0.5 tablets (10 mg total) daily for 2 days. 02/08/23 02/18/23 Yes Mardene Sayer, MD  albuterol (VENTOLIN HFA) 108 (90 Base) MCG/ACT inhaler Inhale 2 puffs into the lungs every 4 (four) hours as needed for wheezing or shortness of breath. 10/18/21   Luciano Cutter, MD  apixaban (ELIQUIS) 5 MG TABS tablet Take 1 tablet (5 mg total) by mouth 2 (two) times daily. Patient not taking: Reported on 10/30/2022 07/24/22   Graciella Freer, PA-C  fluticasone [facility]) 50 MCG/ACT nasal spray Place 1 spray into both nostrils daily. 10/24/21   Cobb, Ruby Cola, NP  fluticasone-salmeterol (ADVAIR HFA) 230-21 MCG/ACT inhaler Inhale 2 puffs into the lungs 2 (two) times daily. 10/30/22   Luciano Cutter, MD  omalizumab [facility]) 150 MG/ML prefilled syringe Inject 300 mg into the skin every 28 (twenty-eight) days. Received at infusion center    [provider]      Allergies    Codeine    Review of Systems   Review of Systems  Respiratory:  Positive for shortness of breath.     Physical Exam Updated Vital Signs BP (!) 165/89  Pulse (!) 45   Temp (!) 97.5 F (36.4 C)   Resp 11   SpO2 95%  Physical Exam Constitutional: Alert and orientedx4 .GCS 15.  Well appearing and in no distress. Eyes: Conjunctivae are normal. ENT      Head: Normocephalic and atraumatic.      Neck: No stridor. Cardiovascular: Irregular rhythm, warm and well-perfused, palpable bilateral radial and PT pulses, bradycardic Respiratory: Normal respiratory effort.  Diffuse end expiratory wheezing with mixed rhonchi.  O2 sat 95 at rest.  No accessory muscle use, no nasal flaring, no retractions. Gastrointestinal: Soft and nontender.  Musculoskeletal: Normal range of motion in all extremities.      Right lower leg: No  tenderness or edema.      Left lower leg: No tenderness or edema. Neurologic: Normal speech and language.  GCS 15.  AOx4.  No gross focal neurologic deficits are appreciated. Skin: Skin is warm, dry and intact. No rash noted. Psychiatric: Mood and affect are normal. Speech and behavior are normal.  ED Results / Procedures / Treatments   Labs (all labs ordered are listed, but only abnormal results are displayed) Labs Reviewed  CBC WITH DIFFERENTIAL/PLATELET - Abnormal; Notable for the following components:      Result Value   Eosinophils Absolute 0.9 (*)    All other components within normal limits  BASIC METABOLIC PANEL - Abnormal; Notable for the following components:   Glucose, Bld 121 (*)    All other components within normal limits  BRAIN NATRIURETIC PEPTIDE - Abnormal; Notable for the following components:   B Natriuretic Peptide 152.1 (*)    All other components within normal limits  I-STAT VENOUS BLOOD GAS, ED - Abnormal; Notable for the following components:   pO2, Ven 52 (*)    Bicarbonate 29.8 (*)    Acid-Base Excess 4.0 (*)    All other components within normal limits  TROPONIN I (HIGH SENSITIVITY) - Abnormal; Notable for the following components:   Troponin I (High Sensitivity) 133 (*)    All other components within normal limits  RESP PANEL BY RT-PCR (RSV, FLU A&B, COVID)  [id number]    EKG EKG Interpretation  Date/Time:  Saturday February 08 2023 10:29:20 EDT Ventricular Rate:  51 PR Interval:    QRS Duration: 92 QT Interval:  489 QTC Calculation: 451 R Axis:   90 Text Interpretation: Atrial fibrillation with new PVCs Borderline right axis deviation Repol abnrm suggests ischemia, inferior leads Confirmed by Vivien Rossetti ([id number]) on 02/08/2023 10:37:54 AM  Radiology DG Chest 2 View  Result Date: 02/08/2023 CLINICAL DATA:  Cough, wheezing and shortness of breath. History of asthma. EXAM: CHEST - 2 VIEW COMPARISON:  06/09/2022 FINDINGS: The heart size and mediastinal  contours are within normal limits. Stable scattered pulmonary scarring bilaterally. Mild bilateral pulmonary hyperinflation. There is no evidence of pulmonary edema, consolidation, pneumothorax, nodule or pleural fluid. The visualized skeletal structures are unremarkable. IMPRESSION: Mild bilateral pulmonary hyperinflation and scattered pulmonary scarring. Electronically Signed   By: Irish Lack M.D.   On: 02/08/2023 11:51    Procedures .Critical Care  Performed by: Mardene Sayer, MD Authorized by: Mardene Sayer, MD   Critical care provider statement:    Critical care time (minutes):  45   Critical care was necessary to treat or prevent imminent or life-threatening deterioration of the following conditions:  Cardiac failure and respiratory failure   Critical care was time spent personally by me on the following activities:  Development of treatment plan  with patient or surrogate, discussions with consultants, evaluation of patient's response to treatment, examination of patient, ordering and review of laboratory studies, ordering and review of radiographic studies, ordering and performing treatments and interventions, pulse oximetry, re-evaluation of patient's condition and review of old charts     Medications Ordered in ED Medications  ipratropium-albuterol (DUONEB) 0.5-2.5 (3) MG/3ML nebulizer solution 3 mL (3 mLs Nebulization Not Given 02/08/23 1244)  dexamethasone (DECADRON) injection 10 mg (10 mg Intravenous Given 02/08/23 1133)  magnesium sulfate IVPB 2 g 50 mL (0 g Intravenous Stopped 02/08/23 1235)    ED Course/ Medical Decision Making/ A&P Clinical Course as of 02/08/23 1844  Sat Feb 08, 2023  1128 Spoke with Dr. Flora Lipps on-call cardiology who confirms that patient is in complete heart block however based on previous notes from Dr. Elberta Fortis he has been in this for many years.  He recommends that if patient continues to increase heart rate or heart rate comes up with walking  then no immediate intervention and can follow-up outpatient. [VB]  1324 Reassessed patient who feels better in terms of breathing.  He continues to refuse any DuoNebs or breathing treatments as he endorses that this caused him to go into A-fib RVR last night when he does not want to do that.  He always feels better with steroids.  He is not hypoxic at rest or on ambulation.  He still shows evidence of heart block but heart rate increases on ambulation.  Blood pressure is elevated on compensating.  I advised patient need to stay to be evaluated by cardiology to be considered for pacemaker and continued treatment of asthma exacerbation however he would much rather follow-up outpatient.  He understands the risks of including but not limited to permanent disability, possible death, however, he would rather Dr Elberta Fortis see him outpatient and arrange for pacemaker.  He requests course of steroids and going home.  Patient does have capacity.  Discharged with steroid taper and strict return precautions. [VB]    Clinical Course User Index [VB] Mardene Sayer, MD                             Medical Decision Making BRAELAN GIESER is a 77 y.o. male.  With PMH of reactive airway disease/asthma, complete heart block, paroxysmal A-fib no longer on anticoagulation who presents with wheezing and shortness of breath.   Patient wheezing on exam-suspect this is a mild exacerbation of chronic asthma/reactive airway disease.  He does not take nebulizer treatments and refuses nebulizer treatment here because he is scared and will cause A-fib RVR again.  I did give patient magnesium and IV steroids here.  He continues to wheeze however there are no clinical signs of increased work of breathing, no nasal flaring, no hypoxia, no retractions.  Chest x-ray reviewed by me and showed no evidence of pneumonia, pulmonary edema, pneumothorax.  He is clinically not fluid overloaded.  I did obtain an EKG which was concerning for  complete heart block.  Of note, on chart review this appears to be followed outpatient by Dr. Elberta Fortis for quite some time.  He denies any active chest pain and high sensitive troponin here 133 down trended from 200s from 8 months prior. Suspect demand from heart block and rAD.  1128 Spoke with Dr. Flora Lipps on-call cardiology who confirms that patient is in complete heart block however based on previous notes from Dr. Elberta Fortis he has been  in this for many years.  He recommends that if patient continues to increase heart rate or heart rate comes up with walking then no immediate intervention and can follow-up outpatient. [VB]  Reassessed patient who feels better in terms of breathing.  He continues to refuse any DuoNebs or breathing treatments as he endorses that this caused him to go into A-fib RVR last night when he does not want to do that.  He always feels better with steroids.  He is not hypoxic at rest or on ambulation.  He still shows evidence of heart block but heart rate increases on ambulation.  Blood pressure is elevated and compensating for bradycardia.  I advised patient need to stay and admitted to be evaluated by cardiology to be considered for pacemaker and continued treatment of asthma exacerbation however he would much rather follow-up outpatient.  He understands the risks of including but not limited to permanent disability, possible death, however, he would rather Dr Elberta Fortis see him outpatient and arrange for pacemaker.  He requests course of steroids and going home.  Patient does have capacity.  Discharged with steroid taper and strict return precautions. [VB]     Amount and/or Complexity of Data Reviewed Labs: ordered. Radiology: ordered.  Risk Prescription drug management.     Final Clinical Impression(s) / ED Diagnoses Final diagnoses:  Exacerbation of asthma, unspecified asthma severity, unspecified whether persistent  Heart block    Rx / DC Orders ED Discharge Orders           Ordered    predniSONE (DELTASONE) 20 MG tablet  Daily        02/08/23 1405              Mardene Sayer, MD 02/08/23 1846

## 2023-02-08 NOTE — ED Notes (Signed)
Pt declined to have delta trop drawn.

## 2023-02-10 ENCOUNTER — Ambulatory Visit: Payer: Medicare Other | Attending: Cardiology | Admitting: Cardiology

## 2023-02-10 ENCOUNTER — Encounter: Payer: Self-pay | Admitting: Cardiology

## 2023-02-10 ENCOUNTER — Encounter: Payer: Self-pay | Admitting: *Deleted

## 2023-02-10 VITALS — BP 126/70 | HR 34 | Ht 68.0 in | Wt 162.0 lb

## 2023-02-10 DIAGNOSIS — I483 Typical atrial flutter: Secondary | ICD-10-CM | POA: Diagnosis not present

## 2023-02-10 DIAGNOSIS — I480 Paroxysmal atrial fibrillation: Secondary | ICD-10-CM | POA: Diagnosis not present

## 2023-02-10 DIAGNOSIS — I442 Atrioventricular block, complete: Secondary | ICD-10-CM | POA: Diagnosis not present

## 2023-02-10 NOTE — Telephone Encounter (Signed)
Pt scheduled to be seen this morning by Dr. Elberta Fortis.

## 2023-02-10 NOTE — H&P (View-Only) (Signed)
 Electrophysiology Office Note   Date:  02/10/2023   ID:  David [name], DOB 01/16/1946, MRN [id number]  PCP:  Ryter-Brown, Sherry M, MD  Cardiologist:  [place] Primary Electrophysiologist:  Merida Alcantar Martin Kayra Crowell, MD    No chief complaint on file.    History of Present Illness: David [name] is a 76 y.o. male who is being seen today for the evaluation of bradycardia at the request of Ryter-Brown, Sherry M, *. Presenting today for electrophysiology evaluation.    He presented to hospital with weight gain and lower extremity edema.  Echo showed normal ejection fraction catheterization without coronary artery disease.  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  Holter monitor showed some low results.  He was noted to have atrial flutter and is now post ablation 02/24/2019.  He had a brief episode of atrial fibrillation around URI October 2023.  Eliquis was started.  As he has not had any further episodes, Eliquis has since been stopped.  Presented to the hospital 02/08/2023 with cough and wheezing.  He has asthma that has been followed by pulmonology.  He received steroids.  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  Today, denies symptoms of palpitations, chest pain, shortness of breath, orthopnea, PND, lower extremity edema, claudication, dizziness, presyncope, syncope, bleeding, or neurologic sequela. The patient is tolerating medications without difficulties.  Today he is feeling well.  He continues to go to the gym to workout.  He is ready for pacemaker implant at this point.   Past Medical History:  Diagnosis Date   Altitude sickness    Asthma    exercise-induced; prn inhaler   AV block, 2nd degree    Bilateral inguinal hernia (BIH) 10/16/2012   Overview:  [facility] -Dr. Cornett.  Observation for now 10/2012   Complete heart block (HCC) 04/04/2018   COVID-19    DVT (deep venous  thrombosis) (HCC) 08/13/2018   Right peroneal DVT 08/13/18, post right TKA 08/10/18   Insomnia 07/14/2014   Mobitz type 1 second degree atrioventricular block    Nasal polyps    OA (osteoarthritis) 09/24/2012   Osteoarthritis of right knee 08/06/2018   Reactive airway disease 09/24/2012   Rotator cuff tear, right    impingement, AC arthrosis   Past Surgical History:  Procedure Laterality Date   A-FLUTTER ABLATION N/A 02/24/2019   Procedure: A-FLUTTER ABLATION;  Surgeon: Jackalynn Art Martin, MD;  Location: [facility];  Service: Cardiovascular;  Laterality: N/A;   BACK SURGERY     BIOPSY  07/23/2019   Procedure: BIOPSY;  Surgeon: Hung, Patrick, MD;  Location: [facility];  Service: Endoscopy;;   CARDIAC CATHETERIZATION     CARPAL TUNNEL RELEASE Bilateral 2021   CATARACT EXTRACTION W/ INTRAOCULAR LENS IMPLANT Right    CATARACT EXTRACTION W/ INTRAOCULAR LENS IMPLANT Left    per patient about two years ago 2019   COLONOSCOPY WITH PROPOFOL N/A 07/23/2019   Procedure: COLONOSCOPY WITH PROPOFOL;  Surgeon: Hung, Patrick, MD;  Location: [facility];  Service: Endoscopy;  Laterality: N/A;   EYE SURGERY     HERNIA REPAIR     INGUINAL HERNIA REPAIR Right 05/09/2020   Procedure: OPEN RIGHT INGUINAL HERNIA REPAIR;  Surgeon: Lovick, Ayesha N, MD;  Location: [facility] OR;  Service: General;  Laterality: Right;   INSERTION OF MESH Right 05/09/2020   Procedure: INSERTION OF MESH;  Surgeon: Lovick,   Ayesha N, MD;  Location: [facility] OR;  Service: General;  Laterality: Right;   LEFT HEART CATH AND CORONARY ANGIOGRAPHY N/A 04/06/2018   Procedure: LEFT HEART CATH AND CORONARY ANGIOGRAPHY;  Surgeon: Varanasi, Jayadeep S, MD;  Location: [facility];  Service: Cardiovascular;  Laterality: N/A;   LUMBAR LAMINECTOMY  age 30s   NASAL SINUS SURGERY     POLYPECTOMY  07/23/2019   Procedure: POLYPECTOMY;  Surgeon: Hung, Patrick, MD;  Location: [facility];  Service: Endoscopy;;   REFRACTIVE SURGERY     ROTATOR  CUFF REPAIR Right    SHOULDER ARTHROSCOPY  08/06/2011   Procedure: ARTHROSCOPY SHOULDER;  Surgeon: Makiah V Sypher Jr., MD;  Location: [facility];  Service: Orthopedics;  Laterality: Right;  with subacromial decompression, arthroscopic subscapularis repair, and rotator cuff repair   TOTAL HIP ARTHROPLASTY Right 10/02/2020   Procedure: RIGHT HIP ARTHROPLASTY ANTERIOR APPROACH;  Surgeon: Rowan, Frank, MD;  Location: [facility];  Service: Orthopedics;  Laterality: Right;  3E BED   TOTAL KNEE ARTHROPLASTY Right 08/10/2018   Procedure: TOTAL KNEE ARTHROPLASTY;  Surgeon: Rowan, Frank, MD;  Location: [facility];  Service: Orthopedics;  Laterality: Right;   TOTAL SHOULDER ARTHROPLASTY Left 05/16/2022   Procedure: TOTAL SHOULDER ARTHROPLASTY;  Surgeon: Chandler, Justin, MD;  Location: [facility];  Service: Orthopedics;  Laterality: Left;   TRIGGER FINGER RELEASE Right 06/30/2014   Procedure: RELEASE A-1 PULLEY RIGHT THUMB;  Surgeon: Gary Kuzma, MD;  Location: [facility];  Service: Orthopedics;  Laterality: Right;  ANESTHESIA:  IV REGIONAL FAB     Current Outpatient Medications  Medication Sig Dispense Refill   omalizumab (XOLAIR) 150 MG/ML prefilled syringe Inject 300 mg into the skin every 28 (twenty-eight) days. Received at infusion center     predniSONE (DELTASONE) 20 MG tablet Take 2.5 tablets (50 mg total) by mouth daily for 2 days, THEN 2 tablets (40 mg total) daily for 2 days, THEN 1.5 tablets (30 mg total) daily for 2 days, THEN 1 tablet (20 mg total) daily for 2 days, THEN 0.5 tablets (10 mg total) daily for 2 days. 15 tablet 0   albuterol (VENTOLIN HFA) 108 (90 Base) MCG/ACT inhaler Inhale 2 puffs into the lungs every 4 (four) hours as needed for wheezing or shortness of breath. (Patient not taking: Reported on 02/10/2023) 8 g 1   apixaban (ELIQUIS) 5 MG TABS tablet Take 1 tablet (5 mg total) by mouth 2 (two) times daily. (Patient not taking: Reported on 02/10/2023) 180 tablet 3    fluticasone (FLONASE) 50 MCG/ACT nasal spray Place 1 spray into both nostrils daily. (Patient not taking: Reported on 02/10/2023) 18.2 mL 2   fluticasone-salmeterol (ADVAIR HFA) 230-21 MCG/ACT inhaler Inhale 2 puffs into the lungs 2 (two) times daily. (Patient not taking: Reported on 02/10/2023) 1 each 6   No current facility-administered medications for this visit.    Allergies:   Codeine   Social History:  The patient  reports that he has never smoked. He has never used smokeless tobacco. He reports current alcohol use. He reports that he does not use drugs.   Family History:  The patient's family history includes Alzheimer's disease in his father; Heart Problems in his mother; Prostate cancer in his father.   ROS:  Please see the history of present illness.   Otherwise, review of systems is positive for none.   All other systems are reviewed and negative.   PHYSICAL EXAM: VS:  BP 126/70   Pulse (!) 34     Ht 5' 8" (1.727 m)   Wt 162 lb (73.5 kg)   SpO2 97%   BMI 24.63 kg/m  , BMI Body mass index is 24.63 kg/m. GEN: Well nourished, well developed, in no acute distress  HEENT: normal  Neck: no JVD, carotid bruits, or masses Cardiac: bradycardic; no murmurs, rubs, or gallops,no edema  Respiratory:  clear to auscultation bilaterally, normal work of breathing GI: soft, nontender, nondistended, + BS MS: no deformity or atrophy  Skin: warm and dry Neuro:  Strength and sensation are intact Psych: euthymic mood, full affect  EKG:  EKG is not ordered today. Personal review of the ekg ordered 02/08/23 shows sinus rhythm, complete heart block, PVCs, junctional escape  Recent Labs: 06/09/2022: ALT 12 02/08/2023: B Natriuretic Peptide 152.1; BUN 21; Creatinine, Ser 1.10; Hemoglobin 16.0; Platelets 224; Potassium 4.1; Sodium 139    Lipid Panel     Component Value Date/Time   CHOL 141 04/05/2018 0019   TRIG 27 04/05/2018 0019   HDL 37 (L) 04/05/2018 0019   CHOLHDL 3.8 04/05/2018 0019    VLDL 5 04/05/2018 0019   LDLCALC 99 04/05/2018 0019     Wt Readings from Last 3 Encounters:  02/10/23 162 lb (73.5 kg)  02/03/23 160 lb (72.6 kg)  01/06/23 163 lb 9 oz (74.2 kg)      Other studies Reviewed: Additional studies/ records that were reviewed today include: LHC 04/06/18  Review of the above records today demonstrates:  The left ventricular systolic function is normal. LV end diastolic pressure is normal. The left ventricular ejection fraction is 55-65% by visual estimate. There is no aortic valve stenosis. No significant CAD.  TTE 04/05/18 - Left ventricle: The cavity size was normal. Wall thickness was   normal. Systolic function was normal. The estimated ejection   fraction was in the range of 55% to 60%. Wall motion was normal;   there were no regional wall motion abnormalities. - Aortic valve: Trileaflet; mildly thickened, mildly calcified   leaflets. - Right ventricle: The cavity size was mildly dilated. Wall   thickness was normal. - Tricuspid valve: There was trivial regurgitation.  Holter 04/20/18 - personally reviewed Minimum HR: 28 BPM at 7:36:27 AM(2) Maximum HR: 111 BPM at 9:36:54 AM(2) Average HR: 53 BPM 1% PVCs Sinus rhythm with mobitz I block, 2:1 AV block, and complete heart block during waking hours with narrow complex QRS  ASSESSMENT AND PLAN:  1.  Complete heart block: QRS is narrow.  Able to get a heart rates in the 120s.  Despite this, he does have resting bradycardia.  He has been having quite a few PVCs that could be related to his bradycardia.  Jai Steil plan for pacemaker implant.  Risk and benefits have been discussed.  Risk include bleeding, tamponade, infection, pneumothorax, lead dislodgment, MI, stroke, renal failure.  He understands he is rest and is agreed to the procedure.  2.  Typical atrial flutter: Post ablation 02/24/2019.  No obvious recurrence.  3.  Paroxysmal atrial fibrillation: Occurred with URI 06/09/2022.  CHA2DS2-VASc of 3.  Not  anticoagulated as it was during an episode of asthma.   Current medicines are reviewed at length with the patient today.   The patient does not have concerns regarding his medicines.  The following changes were made today: None  Labs/ tests ordered today include:  No orders of the defined types were placed in this encounter.   Disposition:   FU 3 months  Signed, Eason Housman Martin Adan Beal, MD  02/10/2023   12:28 PM     [facility] [address] [place] [address] [phone] (office) [phone] (fax) 

## 2023-02-10 NOTE — Progress Notes (Signed)
Electrophysiology Office Note   Date:  02/10/2023   ID:  David [name], DOB 1946-04-19, MRN [id number]  PCP:  Deloris Ping, MD  Cardiologist:  Anne Fu Primary Electrophysiologist:  Jewel Mcafee [name], MD    No chief complaint on file.    History of Present Illness: David [name] is a 77 y.o. male who is being seen today for the evaluation of bradycardia at the request of Ryter-Brown, Fritzi Mandes, *. Presenting today for electrophysiology evaluation.    He presented to hospital with weight gain and lower extremity edema.  Echo showed normal ejection fraction catheterization without coronary artery disease.  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  Holter monitor showed some low results.  He was noted to have atrial flutter and is now post ablation 02/24/2019.  He had a brief episode of atrial fibrillation around URI October 2023.  Eliquis was started.  As he has not had any further episodes, Eliquis has since been stopped.  Presented to the hospital 02/08/2023 with cough and wheezing.  He has asthma that has been followed by pulmonology.  He received steroids.  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  Today, denies symptoms of palpitations, chest pain, shortness of breath, orthopnea, PND, lower extremity edema, claudication, dizziness, presyncope, syncope, bleeding, or neurologic sequela. The patient is tolerating medications without difficulties.  Today he is feeling well.  He continues to go to the gym to workout.  He is ready for pacemaker implant at this point.   Past Medical History:  Diagnosis Date   Altitude sickness    Asthma    exercise-induced; prn inhaler   AV block, 2nd degree    Bilateral inguinal hernia (BIH) 10/16/2012   Overview:  [facility] -Dr. Luisa Hart.  Observation for now 10/2012   Complete heart block (HCC) 04/04/2018   COVID-19    DVT (deep venous  thrombosis) (HCC) 08/13/2018   Right peroneal DVT 08/13/18, post right TKA 08/10/18   Insomnia 07/14/2014   Mobitz type 1 second degree atrioventricular block    Nasal polyps    OA (osteoarthritis) 09/24/2012   Osteoarthritis of right knee 08/06/2018   Reactive airway disease 09/24/2012   Rotator cuff tear, right    impingement, AC arthrosis   Past Surgical History:  Procedure Laterality Date   A-FLUTTER ABLATION N/A 02/24/2019   Procedure: A-FLUTTER ABLATION;  Surgeon: Regan Lemming, MD;  Location: [facility];  Service: Cardiovascular;  Laterality: N/A;   BACK SURGERY     BIOPSY  07/23/2019   Procedure: BIOPSY;  Surgeon: Jeani Hawking, MD;  Location: [facility];  Service: Endoscopy;;   CARDIAC CATHETERIZATION     CARPAL TUNNEL RELEASE Bilateral 2021   CATARACT EXTRACTION W/ INTRAOCULAR LENS IMPLANT Right    CATARACT EXTRACTION W/ INTRAOCULAR LENS IMPLANT Left    per patient about two years ago 2019   COLONOSCOPY WITH PROPOFOL N/A 07/23/2019   Procedure: COLONOSCOPY WITH PROPOFOL;  Surgeon: Jeani Hawking, MD;  Location: [facility];  Service: Endoscopy;  Laterality: N/A;   EYE SURGERY     HERNIA REPAIR     INGUINAL HERNIA REPAIR Right 05/09/2020   Procedure: OPEN RIGHT INGUINAL HERNIA REPAIR;  Surgeon: Diamantina Monks, MD;  Location: [facility];  Service: General;  Laterality: Right;   INSERTION OF MESH Right 05/09/2020   Procedure: INSERTION OF MESH;  Surgeon: Bedelia Person,  Lennie Odor, MD;  Location: [facility];  Service: General;  Laterality: Right;   LEFT HEART CATH AND CORONARY ANGIOGRAPHY N/A 04/06/2018   Procedure: LEFT HEART CATH AND CORONARY ANGIOGRAPHY;  Surgeon: Corky Crafts, MD;  Location: [facility] INV[facility] CV LAB;  Service: Cardiovascular;  Laterality: N/A;   LUMBAR LAMINECTOMY  age 29s   NASAL SINUS SURGERY     POLYPECTOMY  07/23/2019   Procedure: POLYPECTOMY;  Surgeon: Jeani Hawking, MD;  Location: [facility];  Service: Endoscopy;;   REFRACTIVE SURGERY     ROTATOR  CUFF REPAIR Right    SHOULDER ARTHROSCOPY  08/06/2011   Procedure: ARTHROSCOPY SHOULDER;  Surgeon: Wyn Forster., MD;  Location: [facility];  Service: Orthopedics;  Laterality: Right;  with subacromial decompression, arthroscopic subscapularis repair, and rotator cuff repair   TOTAL HIP ARTHROPLASTY Right 10/02/2020   Procedure: RIGHT HIP ARTHROPLASTY ANTERIOR APPROACH;  Surgeon: Gean Birchwood, MD;  Location: [facility];  Service: Orthopedics;  Laterality: Right;  3E BED   TOTAL KNEE ARTHROPLASTY Right 08/10/2018   Procedure: TOTAL KNEE ARTHROPLASTY;  Surgeon: Gean Birchwood, MD;  Location: [facility];  Service: Orthopedics;  Laterality: Right;   TOTAL SHOULDER ARTHROPLASTY Left 05/16/2022   Procedure: TOTAL SHOULDER ARTHROPLASTY;  Surgeon: Jones Broom, MD;  Location: [facility];  Service: Orthopedics;  Laterality: Left;   TRIGGER FINGER RELEASE Right 06/30/2014   Procedure: RELEASE A-1 PULLEY RIGHT THUMB;  Surgeon: Cindee Salt, MD;  Location: [facility];  Service: Orthopedics;  Laterality: Right;  ANESTHESIA:  IV [facility] FAB     Current Outpatient Medications  Medication Sig Dispense Refill   omalizumab (XOLAIR) 150 MG/ML prefilled syringe Inject 300 mg into the skin every 28 (twenty-eight) days. Received at infusion center     predniSONE (DELTASONE) 20 MG tablet Take 2.5 tablets (50 mg total) by mouth daily for 2 days, THEN 2 tablets (40 mg total) daily for 2 days, THEN 1.5 tablets (30 mg total) daily for 2 days, THEN 1 tablet (20 mg total) daily for 2 days, THEN 0.5 tablets (10 mg total) daily for 2 days. 15 tablet 0   albuterol (VENTOLIN HFA) 108 (90 Base) MCG/ACT inhaler Inhale 2 puffs into the lungs every 4 (four) hours as needed for wheezing or shortness of breath. (Patient not taking: Reported on 02/10/2023) 8 g 1   apixaban (ELIQUIS) 5 MG TABS tablet Take 1 tablet (5 mg total) by mouth 2 (two) times daily. (Patient not taking: Reported on 02/10/2023) 180 tablet 3    fluticasone (FLONASE) 50 MCG/ACT nasal spray Place 1 spray into both nostrils daily. (Patient not taking: Reported on 02/10/2023) 18.2 mL 2   fluticasone-salmeterol (ADVAIR HFA) 230-21 MCG/ACT inhaler Inhale 2 puffs into the lungs 2 (two) times daily. (Patient not taking: Reported on 02/10/2023) 1 each 6   No current facility-administered medications for this visit.    Allergies:   Codeine   Social History:  The patient  reports that he has never smoked. He has never used smokeless tobacco. He reports current alcohol use. He reports that he does not use drugs.   Family History:  The patient's family history includes Alzheimer's disease in his father; Heart Problems in his mother; Prostate cancer in his father.   ROS:  Please see the history of present illness.   Otherwise, review of systems is positive for none.   All other systems are reviewed and negative.   PHYSICAL EXAM: VS:  BP 126/70   Pulse (!) 34  Ht 5\' 8"  (1.727 m)   Wt 162 lb (73.5 kg)   SpO2 97%   BMI 24.63 kg/m  , BMI Body mass index is 24.63 kg/m. GEN: Well nourished, well developed, in no acute distress  HEENT: normal  Neck: no JVD, carotid bruits, or masses Cardiac: bradycardic; no murmurs, rubs, or gallops,no edema  Respiratory:  clear to auscultation bilaterally, normal work of breathing GI: soft, nontender, nondistended, + BS MS: no deformity or atrophy  Skin: warm and dry Neuro:  Strength and sensation are intact Psych: euthymic mood, full affect  EKG:  EKG is not ordered today. Personal review of the ekg ordered 02/08/23 shows sinus rhythm, complete heart block, PVCs, junctional escape  Recent Labs: 06/09/2022: ALT 12 02/08/2023: B Natriuretic Peptide 152.1; BUN 21; Creatinine, Ser 1.10; Hemoglobin 16.0; Platelets 224; Potassium 4.1; Sodium 139    Lipid Panel     Component Value Date/Time   CHOL 141 04/05/2018 0019   TRIG 27 04/05/2018 0019   HDL 37 (L) 04/05/2018 0019   CHOLHDL 3.8 04/05/2018 0019    VLDL 5 04/05/2018 0019   LDLCALC 99 04/05/2018 0019     Wt Readings from Last 3 Encounters:  02/10/23 162 lb (73.5 kg)  02/03/23 160 lb (72.6 kg)  01/06/23 163 lb 9 oz (74.2 kg)      Other studies Reviewed: Additional studies/ records that were reviewed today include: LHC 04/06/18  Review of the above records today demonstrates:  The left ventricular systolic function is normal. LV end diastolic pressure is normal. The left ventricular ejection fraction is 55-65% by visual estimate. There is no aortic valve stenosis. No significant CAD.  TTE 04/05/18 - Left ventricle: The cavity size was normal. Wall thickness was   normal. Systolic function was normal. The estimated ejection   fraction was in the range of 55% to 60%. Wall motion was normal;   there were no regional wall motion abnormalities. - Aortic valve: Trileaflet; mildly thickened, mildly calcified   leaflets. - Right ventricle: The cavity size was mildly dilated. Wall   thickness was normal. - Tricuspid valve: There was trivial regurgitation.  Holter 04/20/18 - personally reviewed Minimum HR: 28 BPM at 7:36:27 AM(2) Maximum HR: 111 BPM at 9:36:54 AM(2) Average HR: 53 BPM 1% PVCs Sinus rhythm with mobitz I block, 2:1 AV block, and complete heart block during waking hours with narrow complex QRS  ASSESSMENT AND PLAN:  1.  Complete heart block: QRS is narrow.  Able to get a heart rates in the 120s.  Despite this, he does have resting bradycardia.  He has been having quite a few PVCs that could be related to his bradycardia.  Kemi Gell plan for pacemaker implant.  Risk and benefits have been discussed.  Risk include bleeding, tamponade, infection, pneumothorax, lead dislodgment, MI, stroke, renal failure.  He understands he is rest and is agreed to the procedure.  2.  Typical atrial flutter: Post ablation 02/24/2019.  No obvious recurrence.  3.  Paroxysmal atrial fibrillation: Occurred with URI 06/09/2022.  CHA2DS2-VASc of 3.  Not  anticoagulated as it was during an episode of asthma.   Current medicines are reviewed at length with the patient today.   The patient does not have concerns regarding his medicines.  The following changes were made today: None  Labs/ tests ordered today include:  No orders of the defined types were placed in this encounter.   Disposition:   FU 3 months  Signed, Rosamaria Donn [name], MD  02/10/2023  12:28 PM     [facility] [address] [place] [address] [phone] (office) [phone] (fax)

## 2023-02-10 NOTE — Patient Instructions (Addendum)
Medication Instructions:  Your physician recommends that you continue on your current medications as directed. Please refer to the Current Medication list given to you today.     * If you need a refill on your cardiac medications before your next appointment, please call your pharmacy. *   Labwork: None ordered * Will notify you of abnormal results, otherwise continue current treatment plan.*   Testing/Procedures: Your physician has recommended that you have a pacemaker inserted. A pacemaker is a small device that is placed under the skin of your chest or abdomen to help control abnormal heart rhythms. This device uses electrical pulses to prompt the heart to beat at a normal rate. Pacemakers are used to treat heart rhythms that are too slow. Wire (leads) are attached to the pacemaker that goes into the chambers of you heart. This is done in the hospital and usually requires and overnight stay. Please follow the instruction sheet given to you today.   Follow-Up: Your physician recommends that you schedule a wound check appointment 10-14 days, after your procedure on 02/25/23, with the device clinic.  Your physician recommends that you schedule a follow up appointment in 91 days, after your procedure on 02/25/23, with Dr. Elberta Fortis.   Thank you for choosing [facility]!!   Dory Horn, RN [phone]   Other Instructions   Pacemaker Implantation, Adult Pacemaker implantation is a procedure to place a pacemaker inside your chest. A pacemaker is a small computer that sends electrical signals to the heart and helps your heart beat normally. A pacemaker also stores information about your heart rhythms. You may need pacemaker implantation if you: Have a slow heartbeat (bradycardia). Faint (syncope). Have shortness of breath (dyspnea) due to heart problems.  The pacemaker attaches to your heart through a wire, called a lead. Sometimes just one lead is needed. Other times, there will  be two leads. There are two types of pacemakers: Transvenous pacemaker. This type is placed under the skin or muscle of your chest. The lead goes through a vein in the chest area to reach the inside of the heart. Epicardial pacemaker. This type is placed under the skin or muscle of your chest or belly. The lead goes through your chest to the outside of the heart.  Tell a health care provider about: Any allergies you have. All medicines you are taking, including vitamins, herbs, eye drops, creams, and over-the-counter medicines. Any problems you or family members have had with anesthetic medicines. Any blood or bone disorders you have. Any surgeries you have had. Any medical conditions you have. Whether you are pregnant or may be pregnant. What are the risks? Generally, this is a safe procedure. However, problems may occur, including: Infection. Bleeding. Failure of the pacemaker or the lead. Collapse of a lung or bleeding into a lung. Blood clot inside a blood vessel with a lead. Damage to the heart. Infection inside the heart (endocarditis). Allergic reactions to medicines.  What happens before the procedure? Staying hydrated Follow instructions from your health care provider about hydration, which may include: Up to 2 hours before the procedure - you may continue to drink clear liquids, such as water, clear fruit juice, black coffee, and plain tea.  Eating and drinking restrictions Follow instructions from your health care provider about eating and drinking, which may include: 8 hours before the procedure - stop eating heavy meals or foods such as meat, fried foods, or fatty foods. 6 hours before the procedure - stop eating light meals  or foods, such as toast or cereal. 6 hours before the procedure - stop drinking milk or drinks that contain milk. 2 hours before the procedure - stop drinking clear liquids.  Medicines Ask your health care provider about: Changing or stopping  your regular medicines. This is especially important if you are taking diabetes medicines or blood thinners. Taking medicines such as aspirin and ibuprofen. These medicines can thin your blood. Do not take these medicines before your procedure if your health care provider instructs you not to. You may be given antibiotic medicine to help prevent infection. General instructions You will have a heart evaluation. This may include an electrocardiogram (ECG), chest X-ray, and heart imaging (echocardiogram,  or echo) tests. You will have blood tests. Do not use any products that contain nicotine or tobacco, such as cigarettes and e-cigarettes. If you need help quitting, ask your health care provider. Plan to have someone take you home from the hospital or clinic. If you will be going home right after the procedure, plan to have someone with you for 24 hours. Ask your health care provider how your surgical site will be marked or identified. What happens during the procedure? To reduce your risk of infection: Your health care team will wash or sanitize their hands. Your skin will be washed with soap. Hair may be removed from the surgical area. An IV tube will be inserted into one of your veins. You will be given one or more of the following: A medicine to help you relax (sedative). A medicine to numb the area (local anesthetic). A medicine to make you fall asleep (general anesthetic). If you are getting a transvenous pacemaker: An incision will be made in your upper chest. A pocket will be made for the pacemaker. It may be placed under the skin or between layers of muscle. The lead will be inserted into a blood vessel that returns to the heart. While X-rays are taken by an imaging machine (fluoroscopy), the lead will be advanced through the vein to the inside of your heart. The other end of the lead will be tunneled under the skin and attached to the pacemaker. If you are getting an epicardial  pacemaker: An incision will be made near your ribs or breastbone (sternum) for the lead. The lead will be attached to the outside of your heart. Another incision will be made in your chest or upper belly to create a pocket for the pacemaker. The free end of the lead will be tunneled under the skin and attached to the pacemaker. The transvenous or epicardial pacemaker will be tested. Imaging studies may be done to check the lead position. The incisions will be closed with stitches (sutures), adhesive strips, or skin glue. Bandages (dressing) will be placed over the incisions. The procedure may vary among health care providers and hospitals. What happens after the procedure? Your blood pressure, heart rate, breathing rate, and blood oxygen level will be monitored until the medicines you were given have worn off. You will be given antibiotics and pain medicine. ECG and chest x-rays will be done. You will wear a continuous type of ECG (Holter monitor) to check your heart rhythm. Your health care provider will program the pacemaker. Do not drive for 24 hours if you received a sedative. This information is not intended to replace advice given to you by your health care provider. Make sure you discuss any questions you have with your health care provider. Document Released: 08/09/2002 Document Revised: 03/08/2016 Document Reviewed:  01/31/2016 Elsevier Interactive Patient Education  2018 [employer].     Pacemaker Implantation, Adult, Care After This sheet gives you information about how to care for yourself after your procedure. Your health care provider may also give you more specific instructions. If you have problems or questions, contact your health care provider. What can I expect after the procedure? After the procedure, it is common to have: Mild pain. Slight bruising. Some swelling over the incision. A slight bump over the skin where the device was placed. Sometimes, it is possible  to feel the device under the skin. This is normal.  Follow these instructions at home: Medicines Take over-the-counter and prescription medicines only as told by your health care provider. If you were prescribed an antibiotic medicine, take it as told by your health care provider. Do not stop taking the antibiotic even if you start to feel better. Wound care Do not remove the bandage on your chest until directed to do so by your health care provider. After your bandage is removed, you may see pieces of tape called skin adhesive strips over the area where the cut was made (incision site). Let them fall off on their own. Check the incision site every day to make sure it is not infected, bleeding, or starting to pull apart. Do not use lotions or ointments near the incision site unless directed to do so. Keep the incision area clean and dry for 2-3 days after the procedure or as directed by your health care provider. It takes several weeks for the incision site to completely heal. Do not take baths, swim, or use a hot tub for 7-10 days or as otherwise directed by your health care provider. Activity Do not drive or use heavy machinery while taking prescription pain medicine. Do not drive for 24 hours if you were given a medicine to help you relax (sedative). Check with your health care provider before you start to drive or play sports. Avoid sudden jerking, pulling, or chopping movements that pull your upper arm far away from your body. Avoid these movements for at least 6 weeks or as long as told by your health care provider. Do not lift your upper arm above your shoulders for at least 6 weeks or as long as told by your health care provider. This means no tennis, golf, or swimming. You may go back to work when your health care provider says it is okay. Pacemaker care You may be shown how to transfer data from your pacemaker through the phone to your health care provider. Always let all health care  providers know about your pacemaker before you have any medical procedures or tests. Wear a medical ID bracelet or necklace stating that you have a pacemaker. Carry a pacemaker ID card with you at all times. Your pacemaker battery will last for 5-15 years. Routine checks by your health care provider will let the health care provider know when the battery is starting to run down. The pacemaker will need to be replaced when the battery starts to run down. Do not use amateur Proofreader. Other electrical devices are safe to use, including power tools, lawn mowers, and speakers. If you are unsure of whether something is safe to use, ask your health care provider. When using your cell phone, hold it to the ear opposite the pacemaker. Do not leave your cell phone in a pocket over the pacemaker. Avoid places or objects that have a strong electric or  Data processing manager, including: Scientist, physiological. When at the airport, let officials know that you have a pacemaker. Power plants. Large electrical generators. Radiofrequency transmission towers, such as cell phone and radio towers. General instructions Weigh yourself every day. If you suddenly gain weight, fluid may be building up in your body. Keep all follow-up visits as told by your health care provider. This is important. Contact a health care provider if: You gain weight suddenly. Your legs or feet swell. It feels like your heart is fluttering or skipping beats (heart palpitations). You have chills or a fever. You have more redness, swelling, or pain around your incisions. You have more fluid or blood coming from your incisions. Your incisions feel warm to the touch. You have pus or a bad smell coming from your incisions. Get help right away if: You have chest pain. You have trouble breathing or are short of breath. You become extremely tired. You are light-headed or you faint. This information is not intended to  replace advice given to you by your health care provider. Make sure you discuss any questions you have with your health care provider. Document Released: 03/08/2005 Document Revised: 05/31/2016 Document Reviewed: 05/31/2016 Elsevier Interactive Patient Education  2018 [employer].    Supplemental Discharge Instructions for  Pacemaker/Defibrillator Patients  ACTIVITY No heavy lifting or vigorous activity with your left/right arm for 6 to 8 weeks.  Do not raise your left/right arm above your head for one week.  Gradually raise your affected arm as drawn below.           __  NO DRIVING for     ; you may begin driving on     .  WOUND CARE Keep the wound area clean and dry.  Do not get this area wet for one week. No showers for one week; you may shower on     . The tape/steri-strips on your wound will fall off; do not pull them off.  No bandage is needed on the site.  DO  NOT apply any creams, oils, or ointments to the wound area. If you notice any drainage or discharge from the wound, any swelling or bruising at the site, or you develop a fever > 101? F after you are discharged home, call the office at once.  SPECIAL INSTRUCTIONS You are still able to use cellular telephones; use the ear opposite the side where you have your pacemaker/defibrillator.  Avoid carrying your cellular phone near your device. When traveling through airports, show security personnel your identification card to avoid being screened in the metal detectors.  Ask the security personnel to use the hand wand. Avoid arc welding equipment, MRI testing (magnetic resonance imaging), TENS units (transcutaneous nerve stimulators).  Call the office for questions about other devices. Avoid electrical appliances that are in poor condition or are not properly grounded. Microwave ovens are safe to be near or to operate.  ADDITIONAL INFORMATION FOR DEFIBRILLATOR PATIENTS SHOULD YOUR DEVICE GO OFF: If your device goes off ONCE and  you feel fine afterward, notify the device clinic nurses. If your device goes off ONCE and you do not feel well afterward, call 911. If your device goes off TWICE, call 911. If your device goes off THREE TIMES IN ONE DAY, call 911.  DO NOT DRIVE YOURSELF OR A FAMILY MEMBER WITH A DEFIBRILLATOR TO THE HOSPITAL--CALL 911.

## 2023-02-11 ENCOUNTER — Encounter (HOSPITAL_BASED_OUTPATIENT_CLINIC_OR_DEPARTMENT_OTHER): Payer: Self-pay | Admitting: Pulmonary Disease

## 2023-02-11 NOTE — Telephone Encounter (Signed)
Mardella Layman, please contact patient in the morning

## 2023-02-12 ENCOUNTER — Encounter: Payer: Self-pay | Admitting: Internal Medicine

## 2023-02-13 ENCOUNTER — Telehealth: Payer: Self-pay

## 2023-02-13 NOTE — Telephone Encounter (Signed)
Transition Care Management Follow-up Telephone Call Date of discharge and from where: [facility] How have you been since you were released from the hospital? Doing fine  Any questions or concerns? No  Items Reviewed: Did the pt receive and understand the discharge instructions provided? Yes  Medications obtained and verified? Yes  Other? No  Any new allergies since your discharge? No  Dietary orders reviewed? No Do you have support at home? Yes    Follow up appointments reviewed:  PCP Hospital f/u appt confirmed? No  Scheduled to see  on  @ . Specialist Hospital f/u appt confirmed?  Scheduled to see  on  @ . Are transportation arrangements needed? No  If their condition worsens, is the pt aware to call PCP or go to the Emergency Dept.? Yes Was the patient provided with contact information for the PCP's office or ED? Yes Was to pt encouraged to call back with questions or concerns? Yes

## 2023-02-20 ENCOUNTER — Ambulatory Visit: Payer: Medicare Other

## 2023-02-24 NOTE — Pre-Procedure Instructions (Signed)
Instructed patient on the following items: Arrival time 0800 Nothing to eat or drink after midnight No meds AM of procedure Responsible person to drive you home and stay with you for 24 hrs   Patient states he is not taken Eliquis anymore

## 2023-02-25 ENCOUNTER — Ambulatory Visit (HOSPITAL_COMMUNITY): Payer: Medicare Other

## 2023-02-25 ENCOUNTER — Encounter (HOSPITAL_COMMUNITY)
Admission: RE | Disposition: A | Discharge status: Home / Self Care | Payer: Self-pay | Source: Home / Self Care | Attending: Cardiology

## 2023-02-25 ENCOUNTER — Ambulatory Visit (HOSPITAL_COMMUNITY)
Admission: RE | Admit: 2023-02-25 | Discharge: 2023-02-25 | Disposition: A | Discharge status: Home / Self Care | Payer: Medicare Other | Attending: Cardiology | Admitting: Cardiology

## 2023-02-25 ENCOUNTER — Other Ambulatory Visit: Payer: Self-pay

## 2023-02-25 DIAGNOSIS — I480 Paroxysmal atrial fibrillation: Secondary | ICD-10-CM | POA: Diagnosis not present

## 2023-02-25 DIAGNOSIS — Z950 Presence of cardiac pacemaker: Secondary | ICD-10-CM | POA: Diagnosis not present

## 2023-02-25 DIAGNOSIS — R001 Bradycardia, unspecified: Secondary | ICD-10-CM | POA: Insufficient documentation

## 2023-02-25 DIAGNOSIS — I442 Atrioventricular block, complete: Secondary | ICD-10-CM | POA: Insufficient documentation

## 2023-02-25 DIAGNOSIS — I441 Atrioventricular block, second degree: Secondary | ICD-10-CM | POA: Diagnosis not present

## 2023-02-25 DIAGNOSIS — J45909 Unspecified asthma, uncomplicated: Secondary | ICD-10-CM | POA: Diagnosis not present

## 2023-02-25 DIAGNOSIS — I4892 Unspecified atrial flutter: Secondary | ICD-10-CM | POA: Insufficient documentation

## 2023-02-25 HISTORY — PX: PACEMAKER IMPLANT: EP1218

## 2023-02-25 SURGERY — PACEMAKER IMPLANT

## 2023-02-25 MED ORDER — CHLORHEXIDINE GLUCONATE 4 % EX SOLN
4.0000 | Freq: Once | CUTANEOUS | Status: DC
  Filled 2023-02-25: qty 60

## 2023-02-25 MED ORDER — LIDOCAINE HCL (PF) 1 % IJ SOLN
INTRAMUSCULAR | Status: AC
  Filled 2023-02-25: qty 60

## 2023-02-25 MED ORDER — ONDANSETRON HCL 4 MG/2ML IJ SOLN: 4.0000 mg | Freq: Four times a day (QID) | INTRAMUSCULAR | Status: DC | PRN

## 2023-02-25 MED ORDER — HEPARIN (PORCINE) IN NACL 1000-0.9 UT/500ML-% IV SOLN
INTRAVENOUS | Status: DC | PRN
  Administered 2023-02-25: 500 mL

## 2023-02-25 MED ORDER — ACETAMINOPHEN 325 MG PO TABS: 325.0000 mg | ORAL_TABLET | ORAL | Status: DC | PRN

## 2023-02-25 MED ORDER — SODIUM CHLORIDE 0.9 % IV SOLN: INTRAVENOUS | Status: DC

## 2023-02-25 MED ORDER — SODIUM CHLORIDE 0.9 % IV SOLN
80.0000 mg | INTRAVENOUS | Status: AC
  Administered 2023-02-25: 80 mg

## 2023-02-25 MED ORDER — CEFAZOLIN SODIUM-DEXTROSE 2-4 GM/100ML-% IV SOLN
INTRAVENOUS | Status: AC
  Filled 2023-02-25: qty 100

## 2023-02-25 MED ORDER — LIDOCAINE HCL (PF) 1 % IJ SOLN
INTRAMUSCULAR | Status: DC | PRN
  Administered 2023-02-25: 60 mL

## 2023-02-25 MED ORDER — CEFAZOLIN SODIUM-DEXTROSE 1-4 GM/50ML-% IV SOLN
1.0000 g | Freq: Four times a day (QID) | INTRAVENOUS | Status: DC
  Administered 2023-02-25: 1 g via INTRAVENOUS
  Filled 2023-02-25: qty 50

## 2023-02-25 MED ORDER — CEFAZOLIN SODIUM-DEXTROSE 2-4 GM/100ML-% IV SOLN
2.0000 g | INTRAVENOUS | Status: AC
  Administered 2023-02-25: 2 g via INTRAVENOUS

## 2023-02-25 MED ORDER — SODIUM CHLORIDE 0.9 % IV SOLN
INTRAVENOUS | Status: AC
  Filled 2023-02-25: qty 2

## 2023-02-25 SURGICAL SUPPLY — 13 items
CABLE SURGICAL S-101-97-12 (CABLE) ×2 IMPLANT
CATH RIGHTSITE C315HIS02 (CATHETERS) IMPLANT
IPG PACE AZUR XT DR MRI W1DR01 (Pacemaker) IMPLANT
LEAD CAPSURE NOVUS 5076-52CM (Lead) IMPLANT
LEAD SELECT SECURE 3830 383069 (Lead) IMPLANT
PACE AZURE XT DR MRI W1DR01 (Pacemaker) ×1 IMPLANT
PAD DEFIB RADIO PHYSIO CONN (PAD) ×2 IMPLANT
SELECT SECURE 3830 383069 (Lead) ×1 IMPLANT
SHEATH 7FR PRELUDE SNAP 13 (SHEATH) IMPLANT
SHEATH PROBE COVER 6X72 (BAG) IMPLANT
SLITTER 6232ADJ (MISCELLANEOUS) IMPLANT
TRAY PACEMAKER INSERTION (PACKS) ×2 IMPLANT
WIRE HI TORQ VERSACORE-J 145CM (WIRE) IMPLANT

## 2023-02-25 NOTE — Interval H&P Note (Signed)
History and Physical Interval Note:  02/25/2023 10:07 AM  David [name]  has presented today for surgery, with the diagnosis of heart block.  The various methods of treatment have been discussed with the patient and family. After consideration of risks, benefits and other options for treatment, the patient has consented to  Procedure(s): PACEMAKER IMPLANT (N/A) as a surgical intervention.  The patient's history has been reviewed, patient examined, no change in status, stable for surgery.  I have reviewed the patient's chart and labs.  Questions were answered to the patient's satisfaction.     David [name] [employer]

## 2023-02-25 NOTE — Discharge Instructions (Addendum)
After Your Pacemaker  PER DR CAMNITZ YOU CAN RETURN TO WORK MONDAY 03/03/23 WITH LIMITED WEIGHT RESTRICTIONS AND LEFT ARM MOVEMENT You have a Medtronic Pacemaker  ACTIVITY Do not lift your arm above shoulder height for 1 week after your procedure. After 7 days, you may progress as below.  You should remove your sling 24 hours after your procedure, unless otherwise instructed by your provider.     Tuesday March 04, 2023  Wednesday March 05, 2023 Thursday March 06, 2023 Friday March 07, 2023   Do not lift, push, pull, or carry anything over 10 pounds with the affected arm until 6 weeks (Tuesday April 08, 2023 ) after your procedure.   You may drive AFTER your wound check, unless you have been told otherwise by your provider.   Ask your healthcare provider when you can go back to work   INCISION/Dressing If you are on a blood thinner such as Coumadin, Xarelto, Eliquis, Plavix, or Pradaxa please confirm with your provider when this should be resumed. XXXXX  If large square, outer bandage is left in place, this can be removed after 24 hours from your procedure. Do not remove steri-strips or glue as below.   If a PRESSURE DRESSING (a bulky dressing that usually goes up over your shoulder) was applied or left in place, please follow instructions given by your provider on when to return to have this removed.   Monitor your Pacemaker site for redness, swelling, and drainage. Call the device clinic at [phone] if you experience these symptoms or fever/chills.  If your incision is sealed with Steri-strips or staples, you may shower 7 days after your procedure or when told by your provider. Do not remove the steri-strips or let the shower hit directly on your site. You may wash around your site with soap and water.    If you were discharged in a sling, please do not wear this during the day more than 48 hours after your surgery unless otherwise instructed. This may increase the risk of stiffness and  soreness in your shoulder.   Avoid lotions, ointments, or perfumes over your incision until it is well-healed.  You may use a hot tub or a pool AFTER your wound check appointment if the incision is completely closed.  Pacemaker Alerts:  Some alerts are vibratory and others beep. These are NOT emergencies. Please call our office to let us know. If this occurs at night or on weekends, it can wait until the next business day. Send a remote transmission.  If your device is capable of reading fluid status (for heart failure), you will be offered monthly monitoring to review this with you.   DEVICE MANAGEMENT Remote monitoring is used to monitor your pacemaker from home. This monitoring is scheduled every 91 days by our office. It allows [place] to keep an eye on the functioning of your device to ensure it is working properly. You will routinely see your Electrophysiologist annually (more often if necessary).   You should receive your ID card for your new device in 4-8 weeks. Keep this card with you at all times once received. Consider wearing a medical alert bracelet or necklace.  Your Pacemaker may be MRI compatible. This will be discussed at your next office visit/wound check.  You should avoid contact with strong electric or magnetic fields.   Do not use amateur (ham) radio equipment or electric (arc) welding torches. MP3 player headphones with magnets should not be used. Some devices are safe to  use if held at least 12 inches (30 cm) from your Pacemaker. These include power tools, lawn mowers, and speakers. If you are unsure if something is safe to use, ask your health care provider.  When using your cell phone, hold it to the ear that is on the opposite side from the Pacemaker. Do not leave your cell phone in a pocket over the Pacemaker.  You may safely use electric blankets, heating pads, computers, and microwave ovens.  Call the office right away if: You have chest pain. You feel more short of  breath than you have felt before. You feel more light-headed than you have felt before. Your incision starts to open up.  This information is not intended to replace advice given to you by your health care provider. Make sure you discuss any questions you have with your health care provider.

## 2023-02-25 NOTE — Progress Notes (Signed)
Per family member pt is driving to beach on Sunday and is going to work tomorrow, I will inform Dr Elberta Fortis of pts wishes and I will re educate pt.

## 2023-02-26 ENCOUNTER — Telehealth: Payer: Self-pay

## 2023-02-26 ENCOUNTER — Encounter (HOSPITAL_COMMUNITY): Payer: Self-pay | Admitting: Cardiology

## 2023-02-26 NOTE — Telephone Encounter (Signed)
-----   Message from Sheilah Pigeon, [place] sent at 02/25/2023  4:32 PM EDT ----- MDT PPM  Same day discharge today [facility] Resume Eliquis 6/28

## 2023-02-26 NOTE — Telephone Encounter (Signed)
Patient called in stating that his hr is in the 40's according to his apple watch and he was told it would be programmed in his ppm to 65bpm. Pt would like a call back

## 2023-02-26 NOTE — Telephone Encounter (Signed)
Follow-up after same day discharge: Implant date: 02/25/2023 MD: Loman Brooklyn, MD Device: Medtronic [phone] Azure XT DR MRI Location: Left Chest   Wound check visit: 03/13/2023 @ 11:20 AM 90 day MD follow-up: 06/06/2023 @ 10:45 AM  Remote Transmission received:Yes  Dressing/sling removed: Yes  Patient has not been taking Eliquis for 6 months. States he is not to start back. Routing to [place] to update.   Remote transmission received and reviewed. Shows normal device function. Explained to patient that PVC's can cause the apple watch to throw off the timing of the heart rate the way the watch reads. Advised that his rate is in the 70's and pacemaker is working normally. Patient appreciative of call.

## 2023-02-28 NOTE — Telephone Encounter (Signed)
Med list is updated.

## 2023-03-03 ENCOUNTER — Ambulatory Visit (INDEPENDENT_AMBULATORY_CARE_PROVIDER_SITE_OTHER): Payer: Medicare Other

## 2023-03-03 VITALS — BP 179/89 | HR 40 | Temp 97.4°F | Resp 18 | Ht 68.0 in | Wt 165.0 lb

## 2023-03-03 DIAGNOSIS — J4550 Severe persistent asthma, uncomplicated: Secondary | ICD-10-CM | POA: Diagnosis not present

## 2023-03-03 MED ORDER — OMALIZUMAB 150 MG/ML ~~LOC~~ SOSY
300.0000 mg | PREFILLED_SYRINGE | Freq: Once | SUBCUTANEOUS | Status: AC
  Administered 2023-03-03: 300 mg via SUBCUTANEOUS
  Filled 2023-03-03: qty 2

## 2023-03-03 NOTE — Progress Notes (Signed)
Diagnosis: Asthma  Provider:  Chilton Greathouse MD  Procedure: Injection  Xolair (Omalizumab), Dose: 300 mg, Site: subcutaneous, Number of injections: 2  Post Care: Pt BP is in higher side and low pulse on pacemaker. Pt advised to follow up with his cardiologist. Pt verbalized understanding.  Discharge: Condition: Good, Destination: Home . AVS Provided  Performed by:  Garnette Czech, RN

## 2023-03-12 ENCOUNTER — Emergency Department (HOSPITAL_COMMUNITY): Payer: Medicare Other

## 2023-03-12 ENCOUNTER — Other Ambulatory Visit: Payer: Self-pay

## 2023-03-12 ENCOUNTER — Emergency Department (HOSPITAL_COMMUNITY)
Admission: EM | Disposition: A | Discharge status: Home / Self Care | Payer: Self-pay | Source: Home / Self Care | Attending: Emergency Medicine

## 2023-03-12 ENCOUNTER — Telehealth: Payer: Self-pay

## 2023-03-12 ENCOUNTER — Encounter (HOSPITAL_COMMUNITY): Payer: Self-pay

## 2023-03-12 ENCOUNTER — Emergency Department (HOSPITAL_COMMUNITY)
Admission: EM | Admit: 2023-03-12 | Discharge: 2023-03-12 | Disposition: A | Discharge status: Home / Self Care | Payer: Medicare Other | Attending: Emergency Medicine | Admitting: Emergency Medicine

## 2023-03-12 DIAGNOSIS — T82110A Breakdown (mechanical) of cardiac electrode, initial encounter: Secondary | ICD-10-CM

## 2023-03-12 DIAGNOSIS — J449 Chronic obstructive pulmonary disease, unspecified: Secondary | ICD-10-CM | POA: Insufficient documentation

## 2023-03-12 DIAGNOSIS — Z96612 Presence of left artificial shoulder joint: Secondary | ICD-10-CM | POA: Diagnosis not present

## 2023-03-12 DIAGNOSIS — Z86718 Personal history of other venous thrombosis and embolism: Secondary | ICD-10-CM | POA: Insufficient documentation

## 2023-03-12 DIAGNOSIS — R7989 Other specified abnormal findings of blood chemistry: Secondary | ICD-10-CM | POA: Diagnosis not present

## 2023-03-12 DIAGNOSIS — J45909 Unspecified asthma, uncomplicated: Secondary | ICD-10-CM | POA: Diagnosis not present

## 2023-03-12 DIAGNOSIS — T82111A Breakdown (mechanical) of cardiac pulse generator (battery), initial encounter: Secondary | ICD-10-CM

## 2023-03-12 DIAGNOSIS — R778 Other specified abnormalities of plasma proteins: Secondary | ICD-10-CM | POA: Insufficient documentation

## 2023-03-12 DIAGNOSIS — R001 Bradycardia, unspecified: Secondary | ICD-10-CM

## 2023-03-12 DIAGNOSIS — T82118A Breakdown (mechanical) of other cardiac electronic device, initial encounter: Secondary | ICD-10-CM | POA: Insufficient documentation

## 2023-03-12 DIAGNOSIS — Z7901 Long term (current) use of anticoagulants: Secondary | ICD-10-CM | POA: Diagnosis not present

## 2023-03-12 DIAGNOSIS — I442 Atrioventricular block, complete: Secondary | ICD-10-CM | POA: Diagnosis not present

## 2023-03-12 DIAGNOSIS — Z950 Presence of cardiac pacemaker: Secondary | ICD-10-CM | POA: Diagnosis not present

## 2023-03-12 DIAGNOSIS — Z7951 Long term (current) use of inhaled steroids: Secondary | ICD-10-CM | POA: Insufficient documentation

## 2023-03-12 DIAGNOSIS — Y831 Surgical operation with implant of artificial internal device as the cause of abnormal reaction of the patient, or of later complication, without mention of misadventure at the time of the procedure: Secondary | ICD-10-CM | POA: Insufficient documentation

## 2023-03-12 DIAGNOSIS — G4733 Obstructive sleep apnea (adult) (pediatric): Secondary | ICD-10-CM | POA: Insufficient documentation

## 2023-03-12 DIAGNOSIS — J9811 Atelectasis: Secondary | ICD-10-CM | POA: Diagnosis not present

## 2023-03-12 HISTORY — PX: LEAD REVISION/REPAIR: EP1213

## 2023-03-12 LAB — CBC
HCT: 42.7 % (ref 39.0–52.0)
Hemoglobin: 14.3 g/dL (ref 13.0–17.0)
MCH: 32.5 pg (ref 26.0–34.0)
MCHC: 33.5 g/dL (ref 30.0–36.0)
MCV: 97 fL (ref 80.0–100.0)
Platelets: 199 10*3/uL (ref 150–400)
RBC: 4.4 MIL/uL (ref 4.22–5.81)
RDW: 14 % (ref 11.5–15.5)
WBC: 5.6 10*3/uL (ref 4.0–10.5)
nRBC: 0 % (ref 0.0–0.2)

## 2023-03-12 LAB — BASIC METABOLIC PANEL
Anion gap: 8 (ref 5–15)
BUN: 20 mg/dL (ref 8–23)
CO2: 25 mmol/L (ref 22–32)
Calcium: 8.7 mg/dL — ABNORMAL LOW (ref 8.9–10.3)
Chloride: 106 mmol/L (ref 98–111)
Creatinine, Ser: 1.05 mg/dL (ref 0.61–1.24)
GFR, Estimated: >60 mL/min (ref >60)
Glucose, Bld: 98 mg/dL (ref 70–99)
Potassium: 4.3 mmol/L (ref 3.5–5.1)
Sodium: 139 mmol/L (ref 135–145)

## 2023-03-12 LAB — TROPONIN I (HIGH SENSITIVITY)
Troponin I (High Sensitivity): 275 ng/L (ref <18)
Troponin I (High Sensitivity): 261 ng/L (ref <18)

## 2023-03-12 SURGERY — LEAD REVISION/REPAIR
Anesthesia: LOCAL

## 2023-03-12 MED ORDER — CEFAZOLIN SODIUM-DEXTROSE 2-4 GM/100ML-% IV SOLN
INTRAVENOUS | Status: AC
  Filled 2023-03-12: qty 100

## 2023-03-12 MED ORDER — CHLORHEXIDINE GLUCONATE 4 % EX SOLN: 60.0000 mL | Freq: Once | CUTANEOUS | Status: DC

## 2023-03-12 MED ORDER — HEPARIN (PORCINE) IN NACL 1000-0.9 UT/500ML-% IV SOLN
INTRAVENOUS | Status: DC | PRN
  Administered 2023-03-12: 500 mL

## 2023-03-12 MED ORDER — FENTANYL CITRATE (PF) 100 MCG/2ML IJ SOLN
INTRAMUSCULAR | Status: DC | PRN
  Administered 2023-03-12 (×2): 25 ug via INTRAVENOUS

## 2023-03-12 MED ORDER — LIDOCAINE HCL (PF) 1 % IJ SOLN
INTRAMUSCULAR | Status: AC
  Filled 2023-03-12: qty 30

## 2023-03-12 MED ORDER — MIDAZOLAM HCL 2 MG/2ML IJ SOLN
INTRAMUSCULAR | Status: AC
  Filled 2023-03-12: qty 2

## 2023-03-12 MED ORDER — SODIUM CHLORIDE 0.9 % IV SOLN: INTRAVENOUS | Status: DC

## 2023-03-12 MED ORDER — HYDRALAZINE HCL 20 MG/ML IJ SOLN
20.0000 mg | Freq: Once | INTRAMUSCULAR | Status: AC
  Administered 2023-03-12: 20 mg via INTRAVENOUS
  Filled 2023-03-12: qty 1

## 2023-03-12 MED ORDER — MIDAZOLAM HCL 5 MG/5ML IJ SOLN
INTRAMUSCULAR | Status: DC | PRN
  Administered 2023-03-12: 2 mg via INTRAVENOUS
  Administered 2023-03-12: 1 mg via INTRAVENOUS

## 2023-03-12 MED ORDER — CEFAZOLIN SODIUM-DEXTROSE 2-4 GM/100ML-% IV SOLN
2.0000 g | INTRAVENOUS | Status: AC
  Administered 2023-03-12: 2 g via INTRAVENOUS

## 2023-03-12 MED ORDER — FENTANYL CITRATE (PF) 100 MCG/2ML IJ SOLN
INTRAMUSCULAR | Status: AC
  Filled 2023-03-12: qty 2

## 2023-03-12 MED ORDER — SODIUM CHLORIDE 0.9 % IV SOLN
INTRAVENOUS | Status: AC
  Administered 2023-03-12: 80 mg
  Filled 2023-03-12: qty 2

## 2023-03-12 MED ORDER — LIDOCAINE HCL (PF) 1 % IJ SOLN
INTRAMUSCULAR | Status: DC | PRN
  Administered 2023-03-12: 60 mL

## 2023-03-12 MED ORDER — SODIUM CHLORIDE 0.9 % IV SOLN: 80.0000 mg | INTRAVENOUS | Status: AC

## 2023-03-12 MED ORDER — ONDANSETRON HCL 4 MG/2ML IJ SOLN: 4.0000 mg | Freq: Four times a day (QID) | INTRAMUSCULAR | Status: DC | PRN

## 2023-03-12 MED ORDER — ACETAMINOPHEN 325 MG PO TABS: 325.0000 mg | ORAL_TABLET | ORAL | Status: DC | PRN

## 2023-03-12 SURGICAL SUPPLY — 14 items
CABLE SURGICAL S-101-97-12 (CABLE) ×2 IMPLANT
CATH RIGHTSITE C315HIS02 (CATHETERS) IMPLANT
HEMOSTAT SURGICEL 2X4 FIBR (HEMOSTASIS) IMPLANT
KIT MICROPUNCTURE NIT STIFF (SHEATH) IMPLANT
KIT WRENCH (KITS) IMPLANT
LEAD SELECT SECURE 3830 383069 (Lead) IMPLANT
PAD DEFIB RADIO PHYSIO CONN (PAD) ×1 IMPLANT
POUCH AIGIS-R ANTIBACT PPM (Mesh General) ×1 IMPLANT
POUCH AIGIS-R ANTIBACT PPM MED (Mesh General) IMPLANT
SELECT SECURE 3830 383069 (Lead) ×1 IMPLANT
SHEATH 7FR PRELUDE SNAP 13 (SHEATH) IMPLANT
SLITTER 6232ADJ (MISCELLANEOUS) IMPLANT
TRAY PACEMAKER INSERTION (PACKS) ×2 IMPLANT
WIRE HI TORQ VERSACORE-J 145CM (WIRE) IMPLANT

## 2023-03-12 NOTE — ED Triage Notes (Signed)
Pt states he was called this morning and told there is a problem with a probe of the pacemaker on the bottom of pacemaker and it's registering something they don't like. Pt states just had pacemaker placed 02/25/23. Pt denies complaints.

## 2023-03-12 NOTE — ED Provider Notes (Signed)
EMERGENCY DEPARTMENT AT [facility] Provider Note   CSN: [id number] Arrival date & time: 03/12/23  1149     History  Chief Complaint  Patient presents with   Pacemaker Problem    David [name] is a 77 y.o. male.  HPI Presents with concern of bradycardia. Patient is on Eliquis, had pacemaker placed 2 weeks ago due to inconsistent heart rate.  He notes that since discharge due to a new generally well, has been exercising.  He does have a history of asthma notes that he is periodic exacerbations have not appreciably changed recently.  Currently no fever, chills, chest pain.  Today, he was informed by his cardiology team that his pacemaker had 1-lead which may be malfunctioning.  He was sent here for evaluation.     Home Medications Prior to Admission medications   Medication Sig Start Date End Date Taking? Authorizing Provider  albuterol (VENTOLIN HFA) 108 (90 Base) MCG/ACT inhaler Inhale 2 puffs into the lungs every 4 (four) hours as needed for wheezing or shortness of breath. Patient not taking: Reported on 02/10/2023 10/18/21   Luciano Cutter, MD  apixaban (ELIQUIS) 5 MG TABS tablet Take 1 tablet (5 mg total) by mouth 2 (two) times daily. Patient not taking: Reported on 02/10/2023 07/24/22   Graciella Freer, PA-C  fluticasone [facility]) 50 MCG/ACT nasal spray Place 1 spray into both nostrils daily. Patient not taking: Reported on 02/10/2023 10/24/21   Noemi Chapel, NP  fluticasone-salmeterol (ADVAIR HFA) 230-21 MCG/ACT inhaler Inhale 2 puffs into the lungs 2 (two) times daily. Patient taking differently: Inhale 2 puffs into the lungs daily. 10/30/22   Luciano Cutter, MD  omalizumab [facility]) 150 MG/ML prefilled syringe Inject 300 mg into the skin every 28 (twenty-eight) days. Received at infusion center    [provider]      Allergies    Codeine    Review of Systems   Review of Systems  All other systems reviewed and are  negative.   Physical Exam Updated Vital Signs BP (!) 201/107   Pulse (!) 39   Temp 97.9 F (36.6 C) (Oral)   Resp 14   Ht 5\' 8"  (1.727 m)   Wt 74.8 kg   SpO2 99%   BMI 25.07 kg/m  Physical Exam Vitals and nursing note reviewed.  Constitutional:      General: He is not in acute distress.    Appearance: He is well-developed.  HENT:     Head: Normocephalic and atraumatic.  Eyes:     Conjunctiva/sclera: Conjunctivae normal.  Cardiovascular:     Rate and Rhythm: Normal rate and regular rhythm.  Pulmonary:     Effort: Pulmonary effort is normal. No respiratory distress.     Breath sounds: No stridor.  Chest:    Abdominal:     General: There is no distension.  Skin:    General: Skin is warm and dry.  Neurological:     Mental Status: He is alert and oriented to person, place, and time.     ED Results / Procedures / Treatments   Labs (all labs ordered are listed, but only abnormal results are displayed) Labs Reviewed  BASIC METABOLIC PANEL - Abnormal; Notable for the following components:      Result Value   Calcium 8.7 (*)    All other components within normal limits  TROPONIN I (HIGH SENSITIVITY) - Abnormal; Notable for the following components:   Troponin I (High Sensitivity) 275 (*)  All other components within normal limits  CBC  TROPONIN I (HIGH SENSITIVITY)    EKG EKG Interpretation Date/Time:  Wednesday March 12 2023 12:21:03 EDT Ventricular Rate:  39 PR Interval:  404 QRS Duration:  90 QT Interval:  504 QTC Calculation: 405 R Axis:   88  Text Interpretation: paced rhythm inconsistently conducted Confirmed by [name] [phone]) on 03/12/2023 1:43:41 PM  Radiology DG Chest 2 View  Result Date: 03/12/2023 CLINICAL DATA:  Pacemaker malfunction EXAM: CHEST - 2 VIEW COMPARISON:  02/25/2023 FINDINGS: Stable left lingular scarring. Lungs are otherwise clear. No pneumothorax or pleural effusion. Cardiac size within normal limits. Left subclavian dual  lead pacemaker is unchanged. Pulmonary vascularity is normal. No acute bone abnormality. IMPRESSION: 1. No active cardiopulmonary disease. Electronically Signed   By: Helyn Numbers M.D.   On: 03/12/2023 12:30    Procedures Procedures    Medications Ordered in ED Medications  0.9 %  sodium chloride infusion ( Intravenous New Bag/Given 03/12/23 1403)    ED Course/ Medical Decision Making/ A&P                             Medical Decision Making Patient with recent pacemaker placement presents with ongoing episodic bradycardia, concern for dysfunctional pacemaker.  No early physical exam evidence suggesting other abnormality.  Patient's blood pressure likely artifactual, as patient has baseline hypotension and has no other complaints. Patient placed on continuous monitor. On monitor pacemaker impulse does not correspond to QRS, he has bradycardia, rate in 30s and 40s abnormal Pulse ox 99% room air normal Patient labs unremarkable, I reviewed these, discussed them with her cardiology colleagues, given concern for ongoing bradycardia, need for pacemaker revision we also interrogated the pacemaker, discussed with cardiology, patient admitted for further monitoring, management.  Amount and/or Complexity of Data Reviewed External Data Reviewed: notes.    Details: Cardiology notes from pacemaker implantation Labs: ordered. Decision-making details documented in ED Course. Radiology: ordered and independent interpretation performed. Decision-making details documented in ED Course. ECG/medicine tests: ordered and independent interpretation performed. Decision-making details documented in ED Course.   2:50 PM Patient, his son, and daughter-in-law all were of all findings thus far.  I reviewed the patient's pacemaker interrogation and discussed with our electrophysiologist, Dr. Graciela Husbands.  Plan is for pacemaker revision from the ED.  Final Clinical Impression(s) / ED Diagnoses Final diagnoses:   Bradycardia  Malfunction of cardiac pacemaker, initial encounter  Elevated troponin   CRITICAL CARE Performed by: [name] Total critical care time: 40 minutes Critical care time was exclusive of separately billable procedures and treating other patients. Critical care was necessary to treat or prevent imminent or life-threatening deterioration. Critical care was time spent personally by me on the following activities: development of treatment plan with patient and/or surrogate as well as nursing, discussions with consultants, evaluation of patient's response to treatment, examination of patient, obtaining history from patient or surrogate, ordering and performing treatments and interventions, ordering and review of laboratory studies, ordering and review of radiographic studies, pulse oximetry and re-evaluation of patient's condition.     [name], MD 03/12/23 1450

## 2023-03-12 NOTE — H&P (Addendum)
ELECTROPHYSIOLOGY CONSULT NOTE    Patient ID: David [name] MRN: [id number], DOB/AGE: [id number] y.o.  Admit date: 03/12/2023 Date of Consult: 03/12/2023  Primary Physician: Deloris Ping, MD Primary Cardiologist: Will [name], MD  Electrophysiologist: Dr. Elberta Fortis   Referring Provider: Dr. Jeraldine Loots  Patient Profile: David [name] is a 77 y.o. 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 Jeraldine Loots.  HPI:  David [name] is a 77 y.o. male with medical history as above.   Pt had chronic, but relatively stable CHB with ability to get his HRs up into the 120s with exercise. He was having an increasing amount of PVCs and felt prudent to undergo pacing.   Pt received a MDT DDD PPM with 3830 in left bundle position on 6/25 by Dr. Elberta Fortis.  Post operative EKG showed AV dual pacing at 60 bpm, as did post operative interrogation.   Pt called 6/26 with reports of HRs in 40s. Reassurance was given and previous interrogation (from 6/25) was reviewed and felt stable.   Pt had continued to have issues with HRs in the 40s including at pulmonology office 7/1.  On 7/10 device clinic received an alert for elevated RV threshold and it was noted presenting EGM demonstrated loss of capture.  Pt was instructed to come to the hospital for device interrogation and possible lead revision.   Pt is feeling his USOH on arrival. States he had been surprised he hadn't had any increase in his HR. Otherwise has been asymptomatic. He denies any trauma to the area. He did drive 3 days post procedurally, but as above had HRs in the 40s by report POD#1.   Device testing was performed which demonstrated no RV capture up to 8V @ 1.0 ms, and no capture Unipolar up to 5V @ 1.0 ms.   Labs Potassium4.3  (07/10 1219)   Creatinine, ser  1.05 (07/10 1219) PLT  199 (07/10 1219) HGB  14.3 (07/10 1219) WBC 5.6 (07/10 1219) Troponin I (High Sensitivity)275* (07/10 1219).    Past Medical History:  Diagnosis Date   Altitude sickness    Asthma    exercise-induced; prn inhaler   AV block, 2nd degree    Bilateral inguinal hernia (BIH) 10/16/2012   Overview:  [facility] -Dr. Luisa Hart.  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, post right TKA 08/10/18   Insomnia 07/14/2014   Mobitz type 1 second degree atrioventricular block    Nasal polyps    OA (osteoarthritis) 09/24/2012   Osteoarthritis of right knee 08/06/2018   Reactive airway disease 09/24/2012   Rotator cuff tear, right    impingement, AC arthrosis     Surgical History:  Past Surgical History:  Procedure Laterality Date   A-FLUTTER ABLATION N/A 02/24/2019   Procedure: A-FLUTTER ABLATION;  Surgeon: Regan Lemming, MD;  Location: [facility];  Service: Cardiovascular;  Laterality: N/A;   BACK SURGERY     BIOPSY  07/23/2019   Procedure: BIOPSY;  Surgeon: Jeani Hawking, MD;  Location: [facility];  Service: Endoscopy;;   CARDIAC CATHETERIZATION     CARPAL TUNNEL RELEASE Bilateral 2021   CATARACT  EXTRACTION W/ INTRAOCULAR LENS IMPLANT Right    CATARACT EXTRACTION W/ INTRAOCULAR LENS IMPLANT Left    per patient about two years ago 2019   COLONOSCOPY WITH PROPOFOL N/A 07/23/2019   Procedure: COLONOSCOPY WITH PROPOFOL;  Surgeon: Jeani Hawking, MD;  Location: [facility];  Service: Endoscopy;  Laterality: N/A;   EYE SURGERY     HERNIA REPAIR     INGUINAL HERNIA REPAIR Right 05/09/2020   Procedure: OPEN RIGHT INGUINAL HERNIA REPAIR;  Surgeon: Diamantina Monks, MD;  Location: [facility] OR;  Service: General;  Laterality: Right;   INSERTION OF MESH Right 05/09/2020   Procedure: INSERTION OF MESH;  Surgeon:  Diamantina Monks, MD;  Location: [facility] OR;  Service: General;  Laterality: Right;   LEFT HEART CATH AND CORONARY ANGIOGRAPHY N/A 04/06/2018   Procedure: LEFT HEART CATH AND CORONARY ANGIOGRAPHY;  Surgeon: Corky Crafts, MD;  Location: [facility];  Service: Cardiovascular;  Laterality: N/A;   LUMBAR LAMINECTOMY  age 45s   NASAL SINUS SURGERY     PACEMAKER IMPLANT N/A 02/25/2023   Procedure: PACEMAKER IMPLANT;  Surgeon: Regan Lemming, MD;  Location: [facility];  Service: Cardiovascular;  Laterality: N/A;   POLYPECTOMY  07/23/2019   Procedure: POLYPECTOMY;  Surgeon: Jeani Hawking, MD;  Location: [facility];  Service: Endoscopy;;   REFRACTIVE SURGERY     ROTATOR CUFF REPAIR Right    SHOULDER ARTHROSCOPY  08/06/2011   Procedure: ARTHROSCOPY SHOULDER;  Surgeon: Wyn Forster., MD;  Location: [facility];  Service: Orthopedics;  Laterality: Right;  with subacromial decompression, arthroscopic subscapularis repair, and rotator cuff repair   TOTAL HIP ARTHROPLASTY Right 10/02/2020   Procedure: RIGHT HIP ARTHROPLASTY ANTERIOR APPROACH;  Surgeon: Gean Birchwood, MD;  Location: [facility];  Service: Orthopedics;  Laterality: Right;  3E BED   TOTAL KNEE ARTHROPLASTY Right 08/10/2018   Procedure: TOTAL KNEE ARTHROPLASTY;  Surgeon: Gean Birchwood, MD;  Location: [facility];  Service: Orthopedics;  Laterality: Right;   TOTAL SHOULDER ARTHROPLASTY Left 05/16/2022   Procedure: TOTAL SHOULDER ARTHROPLASTY;  Surgeon: Jones Broom, MD;  Location: [facility];  Service: Orthopedics;  Laterality: Left;   TRIGGER FINGER RELEASE Right 06/30/2014   Procedure: RELEASE A-1 PULLEY RIGHT THUMB;  Surgeon: Cindee Salt, MD;  Location: [facility];  Service: Orthopedics;  Laterality: Right;  ANESTHESIA:  IV REGIONAL FAB     (Not in a hospital admission)   Inpatient Medications:   Allergies:  Allergies  Allergen Reactions   Codeine Itching    Family History  Problem  Relation Age of Onset   Heart Problems Mother        had pacemaker, lived to 57   Alzheimer's disease Father    Prostate cancer Father      Physical Exam: Vitals:   03/12/23 1214 03/12/23 1218 03/12/23 1300  BP:  (!) 190/98 (!) 167/94  Pulse:  (!) 43 (!) 40  Resp:  16 12  Temp:  97.9 F (36.6 C)   TempSrc:  Oral   SpO2:  98% 98%  Weight: 74.8 kg    Height: 5\' 8"  (1.727 m)      GEN- NAD, A&O x 3, normal affect HEENT: Normocephalic, atraumatic Lungs- CTAB, Normal effort.  Heart- Regular rate and rhythm, No M/G/R.  GI- Soft, NT, ND.  Extremities- No clubbing, cyanosis, or edema   Radiology/Studies: DG Chest 2 View  Result Date: 03/12/2023 CLINICAL DATA:  Pacemaker malfunction EXAM: CHEST - 2 VIEW COMPARISON:  02/25/2023 FINDINGS: Stable left lingular scarring. Lungs are otherwise clear. No pneumothorax or pleural effusion. Cardiac size within normal limits. Left subclavian dual lead pacemaker is unchanged. Pulmonary vascularity is normal. No acute bone abnormality. IMPRESSION: 1. No active cardiopulmonary disease. Electronically Signed   By: Helyn Numbers M.D.   On: 03/12/2023 12:30   DG Chest 2 View  Result Date: 02/25/2023 CLINICAL DATA:  Pacemaker placement EXAM: CHEST - 2 VIEW COMPARISON:  02/08/2023 FINDINGS: Frontal and lateral views of the chest demonstrate dual lead pacemaker generator within the left anterior chest wall, proximal lead in the region of the right atrium and distal lead in the region of the right ventricle. Cardiac silhouette is enlarged but stable. No airspace disease, effusion, or pneumothorax. No acute bony abnormality. Stable left shoulder arthroplasty. IMPRESSION: 1. No complication after pacemaker placement. Electronically Signed   By: Sharlet Salina M.D.   On: 02/25/2023 15:58   EP PPM/ICD IMPLANT  Result Date: 02/25/2023 SURGEON:  Loman Brooklyn, MD   PREPROCEDURE DIAGNOSIS:  second degree AV block   POSTPROCEDURE DIAGNOSIS:  second degree AV block     PROCEDURES:  1. Pacemaker implantation.   INTRODUCTION:  David [name] is a 77 y.o. male with a history of bradycardia who presents today for pacemaker implantation.  The patient reports intermittent episodes of dizziness over the past few months.  No reversible causes have been identified.  The patient therefore presents today for pacemaker implantation.   DESCRIPTION OF PROCEDURE:  Informed written consent was obtained, and  the patient was brought to the electrophysiology lab in a fasting state.  The patient required no sedation for the procedure today.  The patients left chest was prepped and draped in the usual sterile fashion by the EP lab staff. The skin overlying the left deltopectoral region was infiltrated with lidocaine for local analgesia.  A 4-cm incision was made over the left deltopectoral region.  A left subcutaneous pacemaker pocket was fashioned using a combination of sharp and blunt dissection. Electrocautery was required to assure hemostasis.  Left Upper Extremity Venography: A venogram of the left upper extremity was performed, which revealed a large left cephalic vein, which emptied into a large left subclavian vein.  The left axillary vein was moderate in size.  RA/RV Lead Placement: The left axillary vein was therefore cannulated.  Through the left axillary vein, a Medtronic CapSureFix Novus [id number]  (serial number  [id number]) right atrial lead and an Medtronic SelectSure [id number] (serial number  [id number]) right ventricular lead were advanced with fluoroscopic visualization into the right atrial appendage and left bundle area respectively.  Initial atrial lead P- waves measured 1.6 mV with impedance of 667 ohms and a threshold of 1.3 V at 0.5 msec.  Right ventricular lead R-waves measured 6.6 mV with an impedance of 803 ohms and a threshold of 1.5 V at 0.5 msec.  Both leads were secured to the pectoralis fascia using #2-0 silk over the suture sleeves. Device Placement:  The leads were then  connected to an Medtronic Azure XT DR [id number]   (serial number  [id number] ) pacemaker.  The pocket was irrigated with copious gentamicin solution.  The pacemaker was then placed into the pocket.  The pocket was then closed in 3 layers with 2.0 Vicryl suture for the 3.0 Vicryl suture subcutaneous and subcuticular layers.  Steri-  Strips and a sterile dressing were then applied. EBL<14ml.  There were no early apparent complications.   CONCLUSIONS:  1. Successful implantation of a  Medtronic Azure XT DR [id number]  dual-chamber pacemaker for symptomatic bradycardia  2. No early apparent complications.    Will Elberta Fortis, MD 02/25/2023 12:06 PM   EKG:on arrival demonstrates CHB in the 39s (personally reviewed)  TELEMETRY: CHB (sinus) in 30-40s (personally reviewed)  DEVICE HISTORY: MDT DDD PPM 02/25/2023  Assessment/Plan: CHB S/p MDT PPM 02/25/2023 Pacemaker Malfunction With stable impedance, likely lead dislodgement.  Explained risks, benefits, and alternatives to PPM lead revision, including but not limited to bleeding, infection, pneumothorax, pericardial effusion, lead dislodgement, heart attack, stroke, or death.  Pt verbalized understanding and agrees to proceed.  H/o PAF H/o AFL Flutter ablation 02/2019 AF was in setting of URI and he has not been anticoagulated, can follow burden on device.   For questions or updates, please contact [facility] Please consult [email or web address] for contact info under Cardiology/STEMI.  Signed, Graciella Freer, PA-C  03/12/2023 1:25 PM  Complete heart block narrow QRS  Microlead dislodgment acute with RV failure to capture  Pacemaker implant 02/25/23  Afib.flutter prior ablation   Acute lead failure Will proceed with lead removal and insertion of a new RV pacing lead  The benefits and risks were reviewed including but not limited to death,  perforation, infection, lead dislodgement and device malfunction.  The patient understands agrees and is  willing to proceed.

## 2023-03-12 NOTE — Discharge Instructions (Signed)
After Your Lead Revision  Do not lift your arm above shoulder height for 1 week after your procedure. After 7 days, you may progress as below.  You should remove your sling 24 hours after your procedure, unless otherwise instructed by your provider.     Wednesday March 19, 2023  Thursday March 20, 2023 Friday March 21, 2023 Saturday March 22, 2023   Do not lift, push, pull, or carry anything over 10 pounds with the affected arm until 6 weeks (Wednesday April 23, 2023) after your procedure.   Do not drive until your wound check or until instructed by your healthcare provider that you are safe to do so.   Monitor your surgical site for redness, swelling, and drainage. Call the device clinic at [phone] if you experience these symptoms or fever/chills.  If your incision is sealed with Steri-strips or staples. You may shower 7 days after your procedure and wash your incision with soap and water as long as it is healed. If your incision is closed with Dermabond/Surgical glue. You may shower 1 day after your pacemaker implant and wash your incision with soap and water. Avoid lotions, ointments, or perfumes over your incision until it is well-healed.  You may use a hot tub or a pool AFTER your wound check appointment if the incision is completely closed.   Your cardiac device may be MRI compatible. We will discuss this at your office visit/Wound check  Remote monitoring is used to monitor your cardiac device from home. This monitoring is scheduled every 91 days by our office. It allows [place] to keep an eye on the functioning of your device to ensure it is working properly. You will routinely see your Electrophysiologist annually (more often if necessary).   Implantable Cardiac Device Lead Replacement, Care After This sheet gives you information about how to care for yourself after your procedure. Your health care provider may also give you more specific instructions. If you have problems or questions,  contact your health care provider. What can I expect after the procedure? After your procedure, it is common to have: Mild discomfort at the incision site. A small amount of drainage or bleeding at the incision site. This is usually no more than a spot. Follow these instructions at home: Incision care        Follow instructions from your health care provider about how to take care of your incision. Make sure you: Leave stitches (sutures), skin glue, or adhesive strips in place. These skin closures may need to stay in place for 2 weeks or longer. If adhesive strip edges start to loosen and curl up, you may trim the loose edges. Do not remove adhesive strips completely unless your health care provider tells you to do that. Check your incision area every day for signs of infection. Check for: More redness, swelling, or pain. More fluid or blood. Warmth. Pus or a bad smell. Electric and Engineer, building services cell phones should be kept 12 inches (30 cm) away from the cardiac device when they are on. When talking on a cell phone, use the ear on the opposite side of your cardiac device. Do not place a cell phone in a pocket next to the cardiac device. Household appliances do not interfere with modern-day cardiac device. Medicines Take over-the-counter and prescription medicines only as told by your health care provider. General instructions Do not raise the arm on the side of your procedure higher than your shoulder for at least 7 days. Except  for this restriction, continue to use your arm as normal to prevent problems. Do not take baths, swim, or use a hot tub until your health care provider says it is okay to do so. You may shower as directed by your health care provider. Do not lift anything that is heavier than 10 lb (4.5 kg) for 6 weeks or the limit that your health care provider tells you, until he or she says that it is safe. Return to your normal activities after 2 weeks, or as told by  your health care provider. Ask your health care provider what activities are safe for you. Keep all follow-up visits as told by your health care provider. This is important. Contact a health care provider if: You have more redness, swelling, or pain around your incision. You have more fluid or blood coming from your incision. Your incision feels warm to the touch. You have pus or a bad smell coming from your incision. You have a fever. The arm or hand on the side of the cardiac device becomes swollen. The symptoms you had before your procedure are not getting better. Get help right away if: You develop chest pain. You feel like you will faint. You feel light-headed. You faint. Summary Check your incision area every day for signs of infection, such as more fluid or blood. A small amount of drainage or bleeding at the incision site is normal. Do not raise the arm on the side of your procedure higher than your shoulder for at least 5 days, or as long as directed by your health care provider. Digital cell phones should be kept 12 inches (30 cm) away from the cardiac device when they are on. When talking on a cell phone, use the ear on the opposite side of your cardiac device. If the symptoms that led to having your lead replaced are not getting better, contact your health care provider. This information is not intended to replace advice given to you by your health care provider. Make sure you discuss any questions you have with your health care provider.

## 2023-03-12 NOTE — Telephone Encounter (Signed)
Following alert received from [employer] received for RV lead at HIGH output x3 days.  Pacemaker was implanted for 2nd degree HB with 3830 His-bundle lead. RV impedance trend appears stable. I noted concern for RV capture on presenting 03/12/23 report. I contacted [employer] who confirmed concern for loss of capture and recommends patient assess as soon as possible. Spoke to patient who reports no symptoms. Advised patient I spoke to [place], [place] in hospital who advises patient to come to [facility] now for possible lead revision. Patient voiced understanding and is heading to [facility].   Attempted to call patient back at work, he already left. Left message on cell phone per DPR to not eat or drink anything and if he has taken Eliquis to hold. Left direct number [phone] for patient to call back.

## 2023-03-13 ENCOUNTER — Encounter: Payer: Self-pay | Admitting: Cardiology

## 2023-03-13 ENCOUNTER — Ambulatory Visit: Payer: Medicare Other

## 2023-03-13 DIAGNOSIS — I442 Atrioventricular block, complete: Secondary | ICD-10-CM

## 2023-03-13 LAB — CUP PACEART INCLINIC DEVICE CHECK
Date Time Interrogation Session: 20240711154040
Implantable Lead Connection Status: 753985
Implantable Lead Connection Status: 753985
Implantable Lead Implant Date: 20240625
Implantable Lead Implant Date: 20240625
Implantable Lead Location: 753858
Implantable Lead Location: 753859
Implantable Lead Model: 3830
Implantable Lead Model: 5076
Implantable Pulse Generator Implant Date: 20240625

## 2023-03-13 MED FILL — Midazolam HCl Inj 2 MG/2ML (Base Equivalent): INTRAMUSCULAR | Qty: 3 | Status: AC

## 2023-03-13 NOTE — Progress Notes (Signed)
Wound check appointment to remove pressure dressing. Dermabond intact.   Incision site approximated.  No edema noted.  Pt advised he can now shower.  He will continue to monitor for s/s infection, swelling.  He has phone number for device clinic to call with any concerns.  Wound check scheduled.  Pt completed updated DPR giving permission to speak with his wife.  He requests all calls to his cell phone.

## 2023-03-13 NOTE — Patient Instructions (Addendum)
Follow up as scheduled.  

## 2023-03-14 ENCOUNTER — Encounter (HOSPITAL_COMMUNITY): Payer: Self-pay | Admitting: Internal Medicine

## 2023-03-18 ENCOUNTER — Telehealth: Payer: Self-pay

## 2023-03-18 DIAGNOSIS — M25571 Pain in right ankle and joints of right foot: Secondary | ICD-10-CM | POA: Diagnosis not present

## 2023-03-18 NOTE — Telephone Encounter (Signed)
Transition Care Management Follow-up Telephone Call Date of discharge and from where: 03/12/2023 [facility] How have you been since you were released from the hospital? Patient is feeling better Any questions or concerns? No  Items Reviewed: Did the pt receive and understand the discharge instructions provided? Yes  Medications obtained and verified?  No medication prescribed. Other? No  Any new allergies since your discharge? No  Dietary orders reviewed? Yes Do you have support at home? Yes   Follow up appointments reviewed:  PCP Hospital f/u appt confirmed?  Patient communicated via telephone with PCP 03/13/2023.  Scheduled to see  on  @ . Specialist Hospital f/u appt confirmed? Yes  Scheduled to see  on 03/27/2023 @ [facility]. Are transportation arrangements needed? No  If their condition worsens, is the pt aware to call PCP or go to the Emergency Dept.? Yes Was the patient provided with contact information for the PCP's office or ED? Yes Was to pt encouraged to call back with questions or concerns? Yes  Parminder Trapani [facility]  [facility] Population Health Community Resource Care Guide   [email or web address]  ?? [phone]   Website: [email or web address]  [email or web address]

## 2023-03-27 ENCOUNTER — Ambulatory Visit: Payer: Medicare Other

## 2023-03-27 DIAGNOSIS — I442 Atrioventricular block, complete: Secondary | ICD-10-CM

## 2023-03-27 LAB — CUP PACEART INCLINIC DEVICE CHECK
Battery Remaining Longevity: 140 mo
Battery Voltage: 3.2 V
Brady Statistic AP VP Percent: 14.58 %
Brady Statistic AP VS Percent: 0 %
Brady Statistic AS VP Percent: 85.36 %
Brady Statistic AS VS Percent: 0.05 %
Brady Statistic RA Percent Paced: 14.57 %
Brady Statistic RV Percent Paced: 99.95 %
Date Time Interrogation Session: 20240725170258
Implantable Lead Connection Status: 753985
Implantable Lead Connection Status: 753985
Implantable Lead Implant Date: 20240625
Implantable Lead Implant Date: 20240710
Implantable Lead Location: 753858
Implantable Lead Location: 753859
Implantable Lead Model: 3830
Implantable Lead Model: 5076
Implantable Pulse Generator Implant Date: 20240625
Lead Channel Impedance Value: 323 Ohm
Lead Channel Impedance Value: 361 Ohm
Lead Channel Impedance Value: 532 Ohm
Lead Channel Impedance Value: 532 Ohm
Lead Channel Pacing Threshold Amplitude: 0.5 V
Lead Channel Pacing Threshold Amplitude: 0.75 V
Lead Channel Pacing Threshold Pulse Width: 0.4 ms
Lead Channel Pacing Threshold Pulse Width: 0.4 ms
Lead Channel Sensing Intrinsic Amplitude: 1.25 mV
Lead Channel Sensing Intrinsic Amplitude: 13 mV
Lead Channel Sensing Intrinsic Amplitude: 2.625 mV
Lead Channel Sensing Intrinsic Amplitude: 3.125 mV
Lead Channel Setting Pacing Amplitude: 3.5 V
Lead Channel Setting Pacing Amplitude: 3.5 V
Lead Channel Setting Pacing Pulse Width: 0.4 ms
Lead Channel Setting Sensing Sensitivity: 0.9 mV
Zone Setting Status: 755011

## 2023-03-27 NOTE — Progress Notes (Signed)
Wound check appointment. Dermabond removed prior to appointment. Wound without redness or edema. Incision edges approximated, wound well healed. Normal device function. Thresholds, sensing, and impedances consistent with implant measurements. Device programmed at 3.5V/auto capture programmed on for extra safety margin until 3 month visit. Histogram distribution appropriate for patient and level of activity. No mode switches or high ventricular rates noted. Patient educated about wound care, arm mobility, lifting restrictions. ROV in 3 months with implanting physician.

## 2023-03-27 NOTE — Patient Instructions (Signed)

## 2023-03-31 ENCOUNTER — Ambulatory Visit: Payer: Medicare Other

## 2023-03-31 VITALS — BP 177/110 | HR 65 | Temp 96.8°F | Resp 17 | Ht 68.0 in | Wt 160.8 lb

## 2023-03-31 DIAGNOSIS — J4550 Severe persistent asthma, uncomplicated: Secondary | ICD-10-CM

## 2023-03-31 MED ORDER — OMALIZUMAB 150 MG/ML ~~LOC~~ SOSY
300.0000 mg | PREFILLED_SYRINGE | Freq: Once | SUBCUTANEOUS | Status: AC
  Administered 2023-03-31: 300 mg via SUBCUTANEOUS
  Filled 2023-03-31: qty 2

## 2023-03-31 NOTE — Progress Notes (Signed)
Diagnosis: Asthma  Provider:  Chilton Greathouse MD  Procedure: Injection  Xolair (Omalizumab), Dose: 300 mg, Site: subcutaneous, Number of injections: 2  Post Care: Patient declined observation. 1 injection right arm and 1 injection left arm  Discharge: Condition: Good, Destination: Home . AVS Declined  Performed by:  Rico Ala, LPN

## 2023-04-21 ENCOUNTER — Telehealth: Payer: Self-pay

## 2023-04-21 NOTE — Telephone Encounter (Signed)
Following alert received from [employer] received for 4 AHR detections, longest 6 hr 14 min, EGMs consistent with atrial flutter.  Average v. rates well controlled.  Known history of atrial flutter.  No OAC is noted in Epic. Routing to triage for AFL > 6 hours with no OAC.  Routing to Dr. Elberta Fortis for review.

## 2023-04-28 ENCOUNTER — Ambulatory Visit (INDEPENDENT_AMBULATORY_CARE_PROVIDER_SITE_OTHER): Payer: Medicare Other

## 2023-04-28 VITALS — BP 157/98 | HR 64 | Temp 97.9°F | Resp 18 | Ht 68.0 in | Wt 160.4 lb

## 2023-04-28 DIAGNOSIS — J4550 Severe persistent asthma, uncomplicated: Secondary | ICD-10-CM

## 2023-04-28 MED ORDER — OMALIZUMAB 150 MG/ML ~~LOC~~ SOSY
300.0000 mg | PREFILLED_SYRINGE | Freq: Once | SUBCUTANEOUS | Status: AC
  Administered 2023-04-28: 300 mg via SUBCUTANEOUS
  Filled 2023-04-28: qty 2

## 2023-04-28 NOTE — Progress Notes (Signed)
Diagnosis: Asthma  Provider:  Chilton Greathouse MD  Procedure: Injection  Xolair (Omalizumab), Dose: 300 mg, Site: subcutaneous, Number of injections: 2  One injection administered in right arm and one in left arm.  Post Care: Patient declined observation  Discharge: Condition: Good, Destination: Home . AVS Declined  Performed by:  Wyvonne Lenz, RN

## 2023-04-29 ENCOUNTER — Encounter (HOSPITAL_BASED_OUTPATIENT_CLINIC_OR_DEPARTMENT_OTHER): Payer: Self-pay | Admitting: Pulmonary Disease

## 2023-04-29 ENCOUNTER — Ambulatory Visit (INDEPENDENT_AMBULATORY_CARE_PROVIDER_SITE_OTHER): Payer: Medicare Other | Admitting: Pulmonary Disease

## 2023-04-29 VITALS — BP 118/60 | HR 77 | Resp 16 | Wt 157.6 lb

## 2023-04-29 DIAGNOSIS — Z20822 Contact with and (suspected) exposure to covid-19: Secondary | ICD-10-CM

## 2023-04-29 LAB — POC COVID19 BINAXNOW: SARS Coronavirus 2 Ag: POSITIVE — AB

## 2023-04-29 MED ORDER — FLUTICASONE-SALMETEROL 230-21 MCG/ACT IN AERO: 2.0000 | INHALATION_SPRAY | Freq: Two times a day (BID) | RESPIRATORY_TRACT | 5 refills | Status: DC

## 2023-04-29 MED ORDER — PREDNISONE 10 MG PO TABS: ORAL_TABLET | ORAL | 0 refills | Status: AC

## 2023-04-29 MED ORDER — ALBUTEROL SULFATE HFA 108 (90 BASE) MCG/ACT IN AERS: 2.0000 | INHALATION_SPRAY | RESPIRATORY_TRACT | 11 refills | Status: AC | PRN

## 2023-04-29 NOTE — Patient Instructions (Addendum)
  Mild persistent asthma, steroid dependent Eosinophilic asthma --CONTINUE Advair 230-21 mcg 1-2 puffs TWICE a day. REFILLED --CONTINUE Albuterol AS NEEDED for shortness of breath. REFILLED --CONTINUE Xolair --Will enroll in Dupixent. Please have patient sign paperwork before he leaves  COVID infection --Advised masking and work restriction x 5 days --Prednisone taper ordered and available

## 2023-04-29 NOTE — Progress Notes (Signed)
Synopsis: Referred in January 2021 for Asthma. Former patient of Dr. Sharyn Lull. Francine Graven Subjective:   PATIENT ID: David [name] GENDER: male DOB: 1946/07/20, MRN: [id number]   HPI  Chief Complaint  Patient presents with   Asthma    Asthma, possibly exposed to COVID on Saturday at a party in nursing home. Stopped albuterol wasn't helping has been using symbicort not much help either   David [name] is a 77 year old male never smoker with severe persistent asthma who presents for follow-up.  Synopsis: He started omalizumab 11/29/19 with significant improvement in his symptoms and has stop using his inhaler on a daily basis. He is using Trelegy ellipta as needed, maybe a couple times per month.  He was diagnosed with asthma 20 years ago. He was has been on Advair, Symbicort and Trelegy in the past. He was started on Xolair in 2021 however he feels recently it has been less effective. Did not tolerate Symbicort due to cough during inhalation. Trelegy has been starting become less effective as well. He currently has coughing, wheezing and shortness of breath and awakening at night. He is currently on prednisone and symptoms will recur when he is off steroids. He has required steroid tapers six times in the last year.  01/15/22 Since our last visit inhalers were changed to medium dose Advair HFA. We discussed transitioning to Dupixent on telephone encounters however he has had improved symptoms on Xolair so decided to continue this biologic. He reports he is overall doing well on Xolair and Advair. Not needing albuterol inhaler. Denies shortness of breath, wheezing or coughing. No nocturnal symptoms.  04/29/22 He is planning for left total shoulder arthroplasty on 05/16/2022. Last exacerbation in 10/2021. Currently on Advair ONE puff TWICE a day due to a cough/sore throat. Reports symptoms are overall well controlled. No nocturnal symptoms. On Xolair. Does report mild cough that usually leads to  worsening respiratory symptoms if not addressed.  10/30/22 Since our last visit he was seen in [facility] ED for chest tightness and sore throat. He was treated with duonebs and prednisone 60 mg. Had hypoxemia to upper 80s. C/b atrial flutter. Was started on anticoagulation by ED but this was determined not to continue by his cardiologist. He had paused Advair use for several months. In December he had a URI.   04/29/23 He reports recent covid infection exposure two days ago at a birthday. Denies any current respiratory symptoms related to this. This summer he has been treated for asthma exacerbation in June. 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 Before his cardiac issues,s he reports his asthma symptoms are have worsened with shortness of breath, chest tightness, cough, nightly awakenings and cough and seem to flare up every 1-2 months. He is intermittently taking inhalers mainly due to it not being as effective as before. He reports coughing with his inhaler and is frustrated.  Asthma Control Test ACT Total Score  04/29/2023  2:08 PM 10  10/30/2022 10:30 AM 12  04/29/2022  4:09 PM 20    Past Medical History:  Diagnosis Date   Altitude sickness    Asthma    exercise-induced; prn inhaler   AV block, 2nd degree    Bilateral inguinal hernia (BIH) 10/16/2012   Overview:  [facility] -Dr. Luisa Hart.  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 Received at infusion center     budesonide-formoterol (SYMBICORT) 160-4.5 MCG/ACT inhaler Inhale 2 puffs into the lungs 2 (two) times daily.     fluticasone (FLONASE) 50 MCG/ACT nasal spray Place 1 spray into both nostrils daily. (Patient not taking: Reported on 02/10/2023) 18.2 mL 2   albuterol (VENTOLIN HFA) 108 (90 Base) MCG/ACT inhaler Inhale 2 puffs into the lungs every 4 (four) hours as needed for wheezing or shortness of breath. (Patient not taking: Reported on 02/10/2023) 8 g 1   fluticasone-salmeterol (ADVAIR HFA) 230-21 MCG/ACT inhaler Inhale 2 puffs into the lungs 2 (two) times daily. (Patient not taking: Reported on 04/29/2023) 1 each 6   No facility-administered medications prior to visit.    Review of Systems  Constitutional:  Negative for chills, diaphoresis, fever, malaise/fatigue and weight loss.  HENT:  Negative for congestion.   Respiratory:  Positive for cough, shortness of breath and wheezing. Negative for hemoptysis and sputum production.   Cardiovascular:  Negative for chest pain, palpitations and leg swelling.    Objective:   Vitals:   04/29/23 1329  BP: 118/60  Pulse: 77  Resp: 16  SpO2: 92%  Weight: 157 lb 9.6 oz (71.5 kg)   Physical Exam: General: Well-appearing, no acute distress HENT: [place], AT Eyes: EOMI, no scleral icterus Respiratory: Clear to auscultation bilaterally.  No crackles, wheezing or rales Cardiovascular: RRR, -M/R/G, no JVD Extremities:-Edema,-tenderness Neuro: AAO x4, CNII-XII grossly intact Psych: Normal mood, normal affect  Data Reviewed  Chest imaging: CTA 04/05/18 - No PE. Minimal bibasilar atelectasis. CXR 09/11/20 - lingular scar. Cardiomegaly. CXR 10/24/21 - Linular scar. Unchanged cardiomegaly CXR 03/12/23 - Left basilar scarring. Cardiomegaly with dual lead AICD  PFT  10/18/19 FVC 3.73 (93%) FEV1 2.46 (84%) Ratio 62 TLC 104%  DLCO 111% Interpretation: On bronchodilators (Spiriva). Mild obstructive defect. No significant bronchodilator response however does not preclude benefit of therapy.  Labs IgE  09/29/19 -137 03/22/20 - 297 10/18/21 - 602  Absolute eos 02/08/23 - 900    Assessment & Plan:   Discussion: 77 year old never smoker with severe persistent asthma on Xolair who presents for follow-up. Uncontrolled asthma symptoms despite Xolair. Had multiple exacerbation in 2023 in Feb, Aug and December 2023. Last exacerbation in June 2024. Discussed options including transitioning to alternative biologic. Although inhalers feel ineffective at this time due to presumed increased systemic eosinophilia contributing to his asthma symptoms, I have encouraged inhaler compliance and will change biologic to Dupixent. Addressed questions and concerns regarding asthma management and Dupixent.  Previously failed Symbicort (cough) and Trelegy (ineffective).   Mild persistent asthma, steroid dependent Eosinophilic asthma --CONTINUE Advair 230-21 mcg 1-2 puffs TWICE a day. REFILLED --CONTINUE Albuterol AS NEEDED for shortness of breath. REFILLED --CONTINUE Xolair --Will enroll in Dupixent. Please have patient sign paperwork before he leaves  COVID infection --Advised masking and work restriction x 5 days --Prednisone taper ordered and available  Return in about 3 months (  around 07/30/2023).   I have spent a total time of 40-minutes on the day of the appointment including chart review, data review, collecting history, coordinating care and discussing medical diagnosis and plan with the patient/family. Past medical history, allergies, medications were reviewed. Pertinent imaging, labs and tests included in this note have been reviewed and interpreted independently by me.   Mechele Collin, MD  Pulmonary & Critical Care   Current Outpatient Medications:    omalizumab Geoffry Paradise) 150 MG/ML prefilled syringe, Inject 300 mg  into the skin every 28 (twenty-eight) days. Received at infusion center, Disp: , Rfl:    predniSONE (DELTASONE) 10 MG tablet, Take 4 tablets (40 mg total) by mouth daily with breakfast for 2 days, THEN 3 tablets (30 mg total) daily with breakfast for 2 days, THEN 2 tablets (20 mg total) daily with breakfast for 2 days, THEN 1 tablet (10 mg total) daily with breakfast for 2 days., Disp: 20 tablet, Rfl: 0   albuterol (VENTOLIN HFA) 108 (90 Base) MCG/ACT inhaler, Inhale 2 puffs into the lungs every 4 (four) hours as needed for wheezing or shortness of breath., Disp: 8 g, Rfl: 11   fluticasone (FLONASE) 50 MCG/ACT nasal spray, Place 1 spray into both nostrils daily. (Patient not taking: Reported on 02/10/2023), Disp: 18.2 mL, Rfl: 2   fluticasone-salmeterol (ADVAIR HFA) 230-21 MCG/ACT inhaler, Inhale 2 puffs into the lungs 2 (two) times daily., Disp: 12 g, Rfl: 5

## 2023-04-30 ENCOUNTER — Other Ambulatory Visit (HOSPITAL_COMMUNITY): Payer: Self-pay

## 2023-04-30 ENCOUNTER — Telehealth: Payer: Self-pay

## 2023-04-30 DIAGNOSIS — J4550 Severe persistent asthma, uncomplicated: Secondary | ICD-10-CM

## 2023-04-30 NOTE — Telephone Encounter (Signed)
Received notification from [facility] regarding a prior authorization for DUPIXENT. Authorization has been APPROVED from 04/30/2023 to 04/29/2024. Approval letter sent to scan center.  Per test claim, copay for 28 days supply (including loading dose) is $2,100.39  Patient can fill through [facility]: [phone]   Authorization # [id number]

## 2023-04-30 NOTE — Telephone Encounter (Signed)
Received New start paperwork for DUPIXENT, however the form we received is the Dermatology form from 2022. Will need to work on obtaining signatures on the correct application.  Pt was previously receiving Xolair through his medical benefits due to being ineligible for PAP through [place], however Dupixent must go through pharmacy benefits.  Submitted a Prior Authorization request to [facility] for DUPIXENT via CoverMyMeds. Will update once we receive a response.  Key: [id number]

## 2023-05-22 NOTE — Telephone Encounter (Addendum)
Submitted Patient Assistance Application to [employer] for DUPIXENT along with provider portion, patient portion, PA, medication list, insurance card copy. Will update patient when we receive a response.  Phone #: [phone] Fax #: [phone]  Spoke with patient regarding expressing financial hardship. He does not believe he will qualify based on his income. He states he will call the first week of October since he is traveling next week. Recommended that he defer on disclosing his household income unless specifically asked  Chesley Mires, PharmD, MPH, BCPS, CPP Clinical Pharmacist (Rheumatology and Pulmonology)

## 2023-05-29 ENCOUNTER — Ambulatory Visit (INDEPENDENT_AMBULATORY_CARE_PROVIDER_SITE_OTHER): Payer: Medicare Other

## 2023-05-29 ENCOUNTER — Telehealth: Payer: Self-pay

## 2023-05-29 DIAGNOSIS — I442 Atrioventricular block, complete: Secondary | ICD-10-CM | POA: Diagnosis not present

## 2023-05-29 LAB — CUP PACEART REMOTE DEVICE CHECK
Battery Remaining Longevity: 137 mo
Battery Voltage: 3.16 V
Brady Statistic AP VP Percent: 16.81 %
Brady Statistic AP VS Percent: 0.05 %
Brady Statistic AS VP Percent: 82.72 %
Brady Statistic AS VS Percent: 0.37 %
Brady Statistic RA Percent Paced: 15.29 %
Brady Statistic RV Percent Paced: 99.38 %
Date Time Interrogation Session: 20240925234835
Implantable Lead Connection Status: 753985
Implantable Lead Connection Status: 753985
Implantable Lead Implant Date: 20240625
Implantable Lead Implant Date: 20240710
Implantable Lead Location: 753858
Implantable Lead Location: 753859
Implantable Lead Model: 3830
Implantable Lead Model: 5076
Implantable Pulse Generator Implant Date: 20240625
Lead Channel Impedance Value: 323 Ohm
Lead Channel Impedance Value: 323 Ohm
Lead Channel Impedance Value: 475 Ohm
Lead Channel Impedance Value: 589 Ohm
Lead Channel Pacing Threshold Amplitude: 0.875 V
Lead Channel Pacing Threshold Amplitude: 1 V
Lead Channel Pacing Threshold Pulse Width: 0.4 ms
Lead Channel Pacing Threshold Pulse Width: 0.4 ms
Lead Channel Sensing Intrinsic Amplitude: 14.75 mV
Lead Channel Sensing Intrinsic Amplitude: 14.75 mV
Lead Channel Sensing Intrinsic Amplitude: 3.625 mV
Lead Channel Sensing Intrinsic Amplitude: 3.625 mV
Lead Channel Setting Pacing Amplitude: 1.5 V
Lead Channel Setting Pacing Amplitude: 2.5 V
Lead Channel Setting Pacing Pulse Width: 0.4 ms
Lead Channel Setting Sensing Sensitivity: 0.9 mV
Zone Setting Status: 755011

## 2023-05-29 NOTE — Telephone Encounter (Signed)
Alert received from [employer]:  Scheduled remote reviewed. Normal device function.   PAFL, AF burden is 8.8% of the time.  Longest episode was 32 hours and 32 minutes.  ? OAC according to previous reports.  Sent to triage

## 2023-05-29 NOTE — Telephone Encounter (Signed)
Left message for pt to call/mychart if he is on [facility]

## 2023-05-30 ENCOUNTER — Encounter (HOSPITAL_BASED_OUTPATIENT_CLINIC_OR_DEPARTMENT_OTHER): Payer: Self-pay | Admitting: Pulmonary Disease

## 2023-06-01 ENCOUNTER — Encounter (HOSPITAL_BASED_OUTPATIENT_CLINIC_OR_DEPARTMENT_OTHER): Payer: Self-pay | Admitting: Pulmonary Disease

## 2023-06-02 ENCOUNTER — Emergency Department (HOSPITAL_BASED_OUTPATIENT_CLINIC_OR_DEPARTMENT_OTHER): Payer: Medicare Other

## 2023-06-02 ENCOUNTER — Other Ambulatory Visit: Payer: Self-pay

## 2023-06-02 ENCOUNTER — Emergency Department (HOSPITAL_BASED_OUTPATIENT_CLINIC_OR_DEPARTMENT_OTHER): Payer: Medicare Other | Admitting: Radiology

## 2023-06-02 ENCOUNTER — Ambulatory Visit (INDEPENDENT_AMBULATORY_CARE_PROVIDER_SITE_OTHER): Payer: Medicare Other

## 2023-06-02 ENCOUNTER — Emergency Department (HOSPITAL_BASED_OUTPATIENT_CLINIC_OR_DEPARTMENT_OTHER)
Admission: EM | Admit: 2023-06-02 | Discharge: 2023-06-02 | Disposition: A | Discharge status: Home / Self Care | Payer: Medicare Other | Attending: Emergency Medicine | Admitting: Emergency Medicine

## 2023-06-02 ENCOUNTER — Encounter (HOSPITAL_BASED_OUTPATIENT_CLINIC_OR_DEPARTMENT_OTHER): Payer: Self-pay | Admitting: *Deleted

## 2023-06-02 VITALS — BP 180/110 | HR 70 | Temp 97.8°F | Resp 20 | Ht 68.0 in | Wt 159.8 lb

## 2023-06-02 DIAGNOSIS — J4550 Severe persistent asthma, uncomplicated: Secondary | ICD-10-CM

## 2023-06-02 DIAGNOSIS — I2510 Atherosclerotic heart disease of native coronary artery without angina pectoris: Secondary | ICD-10-CM | POA: Diagnosis not present

## 2023-06-02 DIAGNOSIS — I483 Typical atrial flutter: Secondary | ICD-10-CM | POA: Insufficient documentation

## 2023-06-02 DIAGNOSIS — Z7901 Long term (current) use of anticoagulants: Secondary | ICD-10-CM | POA: Diagnosis not present

## 2023-06-02 DIAGNOSIS — I700 Atherosclerosis of aorta: Secondary | ICD-10-CM | POA: Diagnosis not present

## 2023-06-02 DIAGNOSIS — J45901 Unspecified asthma with (acute) exacerbation: Secondary | ICD-10-CM | POA: Insufficient documentation

## 2023-06-02 DIAGNOSIS — Z471 Aftercare following joint replacement surgery: Secondary | ICD-10-CM | POA: Diagnosis not present

## 2023-06-02 DIAGNOSIS — Z950 Presence of cardiac pacemaker: Secondary | ICD-10-CM | POA: Diagnosis not present

## 2023-06-02 DIAGNOSIS — R7989 Other specified abnormal findings of blood chemistry: Secondary | ICD-10-CM | POA: Diagnosis not present

## 2023-06-02 DIAGNOSIS — R0602 Shortness of breath: Secondary | ICD-10-CM | POA: Diagnosis not present

## 2023-06-02 DIAGNOSIS — Z96612 Presence of left artificial shoulder joint: Secondary | ICD-10-CM | POA: Diagnosis not present

## 2023-06-02 DIAGNOSIS — R0609 Other forms of dyspnea: Secondary | ICD-10-CM

## 2023-06-02 LAB — BASIC METABOLIC PANEL
Anion gap: 9 (ref 5–15)
BUN: 20 mg/dL (ref 8–23)
CO2: 28 mmol/L (ref 22–32)
Calcium: 9.3 mg/dL (ref 8.9–10.3)
Chloride: 102 mmol/L (ref 98–111)
Creatinine, Ser: 0.95 mg/dL (ref 0.61–1.24)
GFR, Estimated: >60 mL/min (ref >60)
Glucose, Bld: 90 mg/dL (ref 70–99)
Potassium: 4.1 mmol/L (ref 3.5–5.1)
Sodium: 139 mmol/L (ref 135–145)

## 2023-06-02 LAB — CBC WITH DIFFERENTIAL/PLATELET
Abs Immature Granulocytes: 0.01 10*3/uL (ref 0.00–0.07)
Basophils Absolute: 0.1 10*3/uL (ref 0.0–0.1)
Basophils Relative: 2 %
Eosinophils Absolute: 0.9 10*3/uL — ABNORMAL HIGH (ref 0.0–0.5)
Eosinophils Relative: 13 %
HCT: 44.4 % (ref 39.0–52.0)
Hemoglobin: 15.3 g/dL (ref 13.0–17.0)
Immature Granulocytes: 0 %
Lymphocytes Relative: 21 %
Lymphs Abs: 1.5 10*3/uL (ref 0.7–4.0)
MCH: 31.7 pg (ref 26.0–34.0)
MCHC: 34.5 g/dL (ref 30.0–36.0)
MCV: 92.1 fL (ref 80.0–100.0)
Monocytes Absolute: 0.5 10*3/uL (ref 0.1–1.0)
Monocytes Relative: 8 %
Neutro Abs: 3.9 10*3/uL (ref 1.7–7.7)
Neutrophils Relative %: 56 %
Platelets: 215 10*3/uL (ref 150–400)
RBC: 4.82 MIL/uL (ref 4.22–5.81)
RDW: 13.8 % (ref 11.5–15.5)
WBC: 6.9 10*3/uL (ref 4.0–10.5)
nRBC: 0 % (ref 0.0–0.2)

## 2023-06-02 LAB — TROPONIN I (HIGH SENSITIVITY)
Troponin I (High Sensitivity): 172 ng/L (ref <18)
Troponin I (High Sensitivity): 162 ng/L (ref <18)

## 2023-06-02 LAB — D-DIMER, QUANTITATIVE: D-Dimer, Quant: 1.31 ug{FEU}/mL — ABNORMAL HIGH (ref 0.00–0.50)

## 2023-06-02 LAB — BRAIN NATRIURETIC PEPTIDE: B Natriuretic Peptide: 157.9 pg/mL — ABNORMAL HIGH (ref 0.0–100.0)

## 2023-06-02 MED ORDER — APIXABAN 5 MG PO TABS: 5.0000 mg | ORAL_TABLET | Freq: Two times a day (BID) | ORAL | 0 refills | Status: DC

## 2023-06-02 MED ORDER — DUPIXENT 300 MG/2ML ~~LOC~~ SOAJ
SUBCUTANEOUS | 0 refills | Status: DC
Start: 2023-06-02 — End: 2023-06-18

## 2023-06-02 MED ORDER — APIXABAN 5 MG PO TABS: 5.0000 mg | ORAL_TABLET | Freq: Two times a day (BID) | ORAL | Status: DC

## 2023-06-02 MED ORDER — ALBUTEROL SULFATE HFA 108 (90 BASE) MCG/ACT IN AERS: 2.0000 | INHALATION_SPRAY | RESPIRATORY_TRACT | Status: DC | PRN

## 2023-06-02 MED ORDER — DEXAMETHASONE SODIUM PHOSPHATE 10 MG/ML IJ SOLN
10.0000 mg | Freq: Once | INTRAMUSCULAR | Status: AC
  Administered 2023-06-02: 10 mg via INTRAVENOUS
  Filled 2023-06-02: qty 1

## 2023-06-02 MED ORDER — IOHEXOL 350 MG/ML SOLN
100.0000 mL | Freq: Once | INTRAVENOUS | Status: AC | PRN
  Administered 2023-06-02: 75 mL via INTRAVENOUS

## 2023-06-02 MED ORDER — OMALIZUMAB 150 MG/ML ~~LOC~~ SOSY
300.0000 mg | PREFILLED_SYRINGE | Freq: Once | SUBCUTANEOUS | Status: AC
  Administered 2023-06-02: 300 mg via SUBCUTANEOUS
  Filled 2023-06-02: qty 2

## 2023-06-02 MED ORDER — PREDNISONE 10 MG PO TABS: 40.0000 mg | ORAL_TABLET | Freq: Every day | ORAL | 0 refills | Status: AC

## 2023-06-02 NOTE — Telephone Encounter (Signed)
Pt will restart Eliquis 5 mg BID. Aware to keep appt 10/30 for routine follow up after PPM implant.  Says that he cannot make it until 3:30 that day due to a meeting.  Pt made aware ok to come in at 3:30 that day. Patient verbalized understanding and agreeable to plan.

## 2023-06-02 NOTE — ED Triage Notes (Signed)
Pt has been having sob x3-4 days.  Pt states that it was an asthma exacerbation.  Pt had a medtronic pacemaker placed in July and today he was called and told that he was in afib and should come to ED.  No CP and no feeling of palpitation.

## 2023-06-02 NOTE — ED Notes (Signed)
Pt now on 2L [place]

## 2023-06-02 NOTE — Progress Notes (Addendum)
Diagnosis: Acute Anemia  Provider:  Chilton Greathouse MD  Procedure: Injection  Xolair (Omalizumab), Dose: 300 mg, Site: subcutaneous, Number of injections: 2  Post Care: Pt is here for Xolair injection Alert,oriented. BP 187/127 automatic and 180/110 Manually, Hr 70 and 98 POX. lightheaded, Coughing,wheezing and feels like he is going to pass out if he gets up and walk. His cardiologist office told him he was in Afib on the way to clinic. Pt said he sent  multiple my chart message to Dr Orlena Sheldon .Per pulmonary from desk no appointment available @ the clinic for today.DR Mannnam and Dr Lanna Poche made  aware of the situation through secure chat.I advised him to call [facility] and sent to ER but he prefers to drive to [place] and verbalized understanding.  Discharge: Condition: Fair, Destination: ED  . AVS Declined  Performed by:  Garnette Czech, RN

## 2023-06-02 NOTE — ED Provider Notes (Signed)
  Physical Exam  BP (!) 168/114   Pulse 65   Temp 97.7 F (36.5 C) (Oral)   Resp 19   SpO2 96%   Physical Exam  Procedures  Procedures  ED Course / MDM    Medical Decision Making Amount and/or Complexity of Data Reviewed Labs: ordered. Radiology: ordered.  Risk Prescription drug management.   ***  Hx of complete heart block, severe asthma  Cardiology called to say he was in atrial fibrillation.    Presenting with shortness of breath.  DDimer pending.

## 2023-06-02 NOTE — ED Notes (Signed)
Pt stated he has Medtr[employer] Pacemaker. Pacemaker interrogated at this time successfully.

## 2023-06-02 NOTE — ED Notes (Signed)
Pt ambulatory to restroom. Upon returning, pt oxygen saturation 85-88% on room air. Pt placed on 3L [place] and now satting at 95%. Respiratory and EDP notified.

## 2023-06-02 NOTE — ED Provider Notes (Signed)
[place] EMERGENCY DEPARTMENT AT [facility] Provider Note   CSN: [id number] Arrival date & time: 06/02/23  1315     History  Chief Complaint  Patient presents with   Shortness of Breath    David [name] is a 77 y.o. male.  Is a 77 year old male is here today for shortness of breath.  Patient was going to his primary care doctor for regular injections for his asthma.  While he was on the way there, he was called by his cardiology office that he appeared to be in atrial fibrillation.  Patient had a pacemaker placed 3 months ago, and was told today that he was in A-fib.  He is not regularly in A-fib, but says that once when he got an albuterol breathing treatment he did go into A-fib.  Patient did not feel any pain in his chest, or abnormal heart rate.  Patient says that he has gotten inconsistent reports from the people monitoring his pacemaker, and says that after he had a placed initially, it was not capturing but he was told that it was, only for to eventually be revealed that his pacemaker was not functioning properly.  Patient's primary concern today is his shortness of breath, which he says only responds to prednisone which he has been requiring more frequently.  He has not had fever or chills.  He did fly back from [place] last week.  He says after he had surgery done on his leg many years ago he did develop a blood clot.   Shortness of Breath      Home Medications Prior to Admission medications   Medication Sig Start Date End Date Taking? Authorizing Provider  albuterol (VENTOLIN HFA) 108 (90 Base) MCG/ACT inhaler Inhale 2 puffs into the lungs every 4 (four) hours as needed for wheezing or shortness of breath. 04/29/23   Luciano Cutter, MD  apixaban (ELIQUIS) 5 MG TABS tablet Take 1 tablet (5 mg total) by mouth 2 (two) times daily. 06/02/23   Camnitz, Will Daphine Deutscher, MD  Dupilumab (DUPIXENT) 300 MG/2ML SOPN Inject 600mg  into the at Day 0 then 300mg  into the skin  every 14 days thereafter 06/02/23   Luciano Cutter, MD  fluticasone [facility]) 50 MCG/ACT nasal spray Place 1 spray into both nostrils daily. Patient not taking: Reported on 02/10/2023 10/24/21   Noemi Chapel, NP  fluticasone-salmeterol (ADVAIR HFA) 230-21 MCG/ACT inhaler Inhale 2 puffs into the lungs 2 (two) times daily. 04/29/23   Luciano Cutter, MD  omalizumab Geoffry Paradise) 150 MG/ML prefilled syringe Inject 300 mg into the skin every 28 (twenty-eight) days. Received at infusion center    [provider]      Allergies    Codeine    Review of Systems   Review of Systems  Respiratory:  Positive for shortness of breath.     Physical Exam Updated Vital Signs BP (!) 168/114   Pulse 65   Temp 97.7 F (36.5 C) (Oral)   Resp 19   SpO2 96%  Physical Exam Vitals reviewed.  HENT:     Head: Normocephalic.  Pulmonary:     Effort: Pulmonary effort is normal.     Breath sounds: Examination of the right-upper field reveals wheezing. Examination of the left-upper field reveals wheezing. Examination of the right-middle field reveals wheezing. Examination of the left-middle field reveals wheezing. Examination of the right-lower field reveals wheezing. Examination of the left-lower field reveals wheezing. Wheezing present. No decreased breath sounds.  Skin:  General: Skin is warm and dry.  Neurological:     Mental Status: He is alert.     ED Results / Procedures / Treatments   Labs (all labs ordered are listed, but only abnormal results are displayed) Labs Reviewed  BASIC METABOLIC PANEL  CBC WITH DIFFERENTIAL/PLATELET  D-DIMER, QUANTITATIVE  BRAIN NATRIURETIC PEPTIDE  TROPONIN I (HIGH SENSITIVITY)    EKG None  Radiology No results found.  Procedures Procedures    Medications Ordered in ED Medications  albuterol (VENTOLIN HFA) 108 (90 Base) MCG/ACT inhaler 2 puff (has no administration in time range)  dexamethasone (DECADRON) injection 10 mg (10 mg Intravenous  Given 06/02/23 1431)    ED Course/ Medical Decision Making/ A&P                                 Medical Decision Making 77 year old male here today with shortness of breath acute on chronic, concern for possibly being in atrial fibrillation.  Differential diagnoses include chronic lung disease, PE, less likely pneumonia, atrial fibrillation.  Plan-regarding the patient's lung complaint, my independent review the patient's chest x-ray shows no pneumonia.  With his history of prior DVT, and recent travel, will obtain a D-dimer.  My independent review of the patient's EKG shows atrial paced rhythm, no ST segment depressions or elevation, no T wave inversions, no evidence of acute ischemia.  No significant changes from July of this year.  Will provide the patient with some Decadron here in the emergency department for his chronic lung disease.  Patient does not want to use albuterol since he says it did give him A-fib Once upon a time, and I think this is reasonable given one of the reasons why is here for the emergency department.  Patient will be signed out to Dr. Dalene Seltzer pending labs, likely discharge.  Amount and/or Complexity of Data Reviewed Labs: ordered. Radiology: ordered.  Risk Prescription drug management.           Final Clinical Impression(s) / ED Diagnoses Final diagnoses:  None    Rx / DC Orders ED Discharge Orders     None         Arletha Pili, DO 06/02/23 1508

## 2023-06-02 NOTE — Telephone Encounter (Signed)
Rx for Dupixent escribed to [facility] today  Chesley Mires, PharmD, MPH, BCPS, CPP Clinical Pharmacist (Rheumatology and Pulmonology)

## 2023-06-06 ENCOUNTER — Encounter: Payer: Medicare Other | Admitting: Cardiology

## 2023-06-06 NOTE — Progress Notes (Signed)
Remote pacemaker transmission.   

## 2023-06-06 NOTE — Telephone Encounter (Signed)
Left detailed message. Asked pt to call office/send my chart message if he would like to be scheduled sooner than current appt next month.

## 2023-06-09 ENCOUNTER — Encounter (HOSPITAL_BASED_OUTPATIENT_CLINIC_OR_DEPARTMENT_OTHER): Payer: Self-pay

## 2023-06-16 ENCOUNTER — Ambulatory Visit (HOSPITAL_BASED_OUTPATIENT_CLINIC_OR_DEPARTMENT_OTHER): Payer: Medicare Other | Admitting: Pulmonary Disease

## 2023-06-16 ENCOUNTER — Encounter (HOSPITAL_BASED_OUTPATIENT_CLINIC_OR_DEPARTMENT_OTHER): Payer: Self-pay | Admitting: Pulmonary Disease

## 2023-06-16 VITALS — BP 124/88 | HR 73 | Resp 16 | Ht 68.0 in | Wt 163.4 lb

## 2023-06-16 DIAGNOSIS — J4550 Severe persistent asthma, uncomplicated: Secondary | ICD-10-CM

## 2023-06-16 MED ORDER — PREDNISONE 10 MG PO TABS: ORAL_TABLET | ORAL | 1 refills | Status: AC

## 2023-06-16 NOTE — Patient Instructions (Signed)
Mild persistent asthma, steroid dependent Eosinophilic asthma --CONTINUE Advair 230-21 mcg 1-2 puffs TWICE a day. REFILLED --CONTINUE Albuterol AS NEEDED for shortness of breath. REFILLED --START Dupixent at infusion center location this week

## 2023-06-16 NOTE — Progress Notes (Signed)
Synopsis: Referred in January 2021 for Asthma. Former patient of Dr. Sharyn Lull. Francine Graven Subjective:   PATIENT ID: David [name] GENDER: male DOB: May 29, 1946, MRN: [id number]   HPI  Chief Complaint  Patient presents with   Follow-up    ED FU Asthma Exacerbation-doing fine since then. Declines flu today.   David [name] is a 77 year old male never smoker with severe persistent asthma who presents for follow-up.  Synopsis: He started omalizumab 11/29/19 with significant improvement in his symptoms and has stop using his inhaler on a daily basis. He is using Trelegy ellipta as needed, maybe a couple times per month.  He was diagnosed with asthma 20 years ago. He was has been on Advair, Symbicort and Trelegy in the past. He was started on Xolair in 2021 however he feels recently it has been less effective. Did not tolerate Symbicort due to cough during inhalation. Trelegy has been starting become less effective as well. He currently has coughing, wheezing and shortness of breath and awakening at night. He is currently on prednisone and symptoms will recur when he is off steroids. He has required steroid tapers six times in the last year.  01/15/22 Since our last visit inhalers were changed to medium dose Advair HFA. We discussed transitioning to Dupixent on telephone encounters however he has had improved symptoms on Xolair so decided to continue this biologic. He reports he is overall doing well on Xolair and Advair. Not needing albuterol inhaler. Denies shortness of breath, wheezing or coughing. No nocturnal symptoms.  04/29/22 He is planning for left total shoulder arthroplasty on 05/16/2022. Last exacerbation in 10/2021. Currently on Advair ONE puff TWICE a day due to a cough/sore throat. Reports symptoms are overall well controlled. No nocturnal symptoms. On Xolair. Does report mild cough that usually leads to worsening respiratory symptoms if not addressed.  10/30/22 Since our last visit he was  seen in [facility] ED for chest tightness and sore throat. He was treated with duonebs and prednisone 60 mg. Had hypoxemia to upper 80s. C/b atrial flutter. Was started on anticoagulation by ED but this was determined not to continue by his cardiologist. He had paused Advair use for several months. In December he had a URI.   04/29/23 He reports recent covid infection exposure two days ago at a birthday. Denies any current respiratory symptoms related to this. This summer he has been treated for asthma exacerbation in June. 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 Before his cardiac issues,s he reports his asthma symptoms are have worsened with shortness of breath, chest tightness, cough, nightly awakenings and cough and seem to flare up every 1-2 months. He is intermittently taking inhalers mainly due to it not being as effective as before. He reports coughing with his inhaler and is frustrated.  06/16/23 Since our last visit he was seen the ED on 06/02/23 for atrial fibrillation. He has been having issues with his pacemaker and noted to be in atrial flutter. Started on eliquis by Cardiology. He was noted to have hypoxemia to 85-88% with exertion but resolved/normalized with rest. In the ED CTA neg for PE. He was treated with IV decadron followed by prednisone  Asthma Control Test ACT Total Score  04/29/2023  2:08 PM 10  10/30/2022 10:30 AM 12  04/29/2022  4:09 PM 20    Past Medical History:  Diagnosis Date   Altitude sickness    Asthma    exercise-induced; prn inhaler   AV block,  2nd degree    Bilateral inguinal hernia (BIH) 10/16/2012   Overview:  [facility] -Dr. Luisa Hart.  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 8 g 11   apixaban (ELIQUIS) 5 MG TABS tablet Take 1 tablet (5 mg total) by mouth 2 (two) times daily.     apixaban (ELIQUIS) 5 MG TABS tablet Take 1 tablet (5 mg total) by mouth 2 (two) times daily. 60 tablet 0   Dupilumab (DUPIXENT) 300 MG/2ML SOPN Inject 600mg  into the at Day 0 then 300mg  into the skin every 14 days thereafter 12 mL 0   fluticasone (FLONASE) 50 MCG/ACT nasal spray Place 1 spray into both nostrils daily. 18.2 mL 2   fluticasone-salmeterol (ADVAIR HFA) 230-21 MCG/ACT inhaler Inhale 2 puffs into the lungs 2 (two) times daily. 12 g 5   omalizumab (XOLAIR) 150 MG/ML prefilled syringe Inject 300 mg into the skin every 28 (twenty-eight) days. Received at infusion center     No facility-administered medications prior to visit.    Review of Systems  Constitutional:  Negative for chills, diaphoresis, fever, malaise/fatigue and weight loss.  HENT:  Negative for congestion.   Respiratory:  Negative for cough, hemoptysis, sputum production, shortness of breath and wheezing.   Cardiovascular:  Negative for chest pain, palpitations and leg swelling.    Objective:   Vitals:   06/16/23 1020  BP: 124/88  Pulse: 73  Resp: 16  SpO2: 97%  Weight: 163 lb 6.4 oz (74.1 kg)  Height: 5\' 8"  (1.727 m)   Physical Exam: General: Well-appearing, no acute distress HENT: [place], AT Eyes: EOMI, no scleral icterus Respiratory: Clear to auscultation bilaterally.  No crackles, wheezing or rales Cardiovascular: RRR, -M/R/G, no JVD Extremities:-Edema,-tenderness Neuro: AAO  x4, CNII-XII grossly intact Psych: Normal mood, normal affect  Data Reviewed  Chest imaging: CTA 04/05/18 - No PE. Minimal bibasilar atelectasis. CXR 09/11/20 - lingular scar. Cardiomegaly. CXR 10/24/21 - Linular scar. Unchanged cardiomegaly CXR 03/12/23 - Left basilar scarring. Cardiomegaly with dual lead AICD CTA 06/02/23 - No PE. Left basilar scarring  PFT  10/18/19 FVC 3.73 (93%) FEV1 2.46 (84%) Ratio 62 TLC 104% DLCO 111% Interpretation: On bronchodilators (Spiriva). Mild obstructive defect. No significant bronchodilator response however does not preclude benefit of therapy.  Labs IgE  09/29/19 -137 03/22/20 - 297 10/18/21 - 602  Absolute eos 02/08/23 - 900    Assessment & Plan:   Discussion: 77 year old male never smoker with severe persistent asthma on Xolair who presents for follow-up. Uncontrolled asthma symptoms on Xolair. Had multiple exacerbation in 2023 in Feb, Aug and December 2023. This year exacerbation in June and September 2024. Currently has Dupixent but not taken yet.   Failed Xolair  Previously failed Symbicort (cough) and  Trelegy (ineffective).   Mild persistent asthma, steroid dependent Eosinophilic asthma --CONTINUE Advair 230-21 mcg 1-2 puffs TWICE a day. REFILLED --CONTINUE Albuterol AS NEEDED for shortness of breath. REFILLED --START Dupixent at infusion center location this week  COVID infection --Advised masking and work restriction x 5 days --Prednisone taper ordered and available  Return in about 3 months (around 09/16/2023).   I have spent a total time of 30-minutes on the day of the appointment including chart review, data review, collecting history, coordinating care and discussing medical diagnosis and plan with the patient/family. Past medical history, allergies, medications were reviewed. Pertinent imaging, labs and tests included in this note have been reviewed and interpreted independently by me.  Mechele Collin, MD [facility]   Current Outpatient Medications:    albuterol (VENTOLIN HFA) 108 (90 Base) MCG/ACT inhaler, Inhale 2 puffs into the lungs every 4 (four) hours as needed for wheezing or shortness of breath., Disp: 8 g, Rfl: 11   apixaban (ELIQUIS) 5 MG TABS tablet, Take 1 tablet (5 mg total) by mouth 2 (two) times daily., Disp: , Rfl:    apixaban (ELIQUIS) 5 MG TABS tablet, Take 1 tablet (5 mg total) by mouth 2 (two) times daily., Disp: 60 tablet, Rfl: 0   Dupilumab (DUPIXENT) 300 MG/2ML SOPN, Inject 600mg  into the at Day 0 then 300mg  into the skin every 14 days thereafter, Disp: 12 mL, Rfl: 0   fluticasone (FLONASE) 50 MCG/ACT nasal spray, Place 1 spray into both nostrils daily., Disp: 18.2 mL, Rfl: 2   fluticasone-salmeterol (ADVAIR HFA) 230-21 MCG/ACT inhaler, Inhale 2 puffs into the lungs 2 (two) times daily., Disp: 12 g, Rfl: 5   omalizumab (XOLAIR) 150 MG/ML prefilled syringe, Inject 300 mg into the skin every 28 (twenty-eight) days. Received at infusion center, Disp: , Rfl:    predniSONE (DELTASONE) 10 MG tablet, Take 4 tablets (40 mg total) by mouth daily with breakfast for 2 days, THEN 3 tablets (30 mg total) daily with breakfast for 2 days, THEN 2 tablets (20 mg total) daily with breakfast for 2 days, THEN 1 tablet (10 mg total) daily with breakfast for 2 days., Disp: 20 tablet, Rfl: 1

## 2023-06-16 NOTE — Telephone Encounter (Signed)
Called for update on Dupixent pt assistance application. Per rep, Jan, they see note that rx was e-scribed and see call but case was never acted on. Rep unable to explain why. She is expediting case  Called patient since he appears to have filled medication with pharmacy at this point. Scheduled for Dupixent new start tomorrow. Will use sample. Patient aware to keep his supply at home,  Phone #: [phone]  Chesley Mires, PharmD, MPH, BCPS, CPP Clinical Pharmacist (Rheumatology and Pulmonology)

## 2023-06-17 ENCOUNTER — Ambulatory Visit: Payer: Medicare Other | Admitting: Pharmacist

## 2023-06-17 DIAGNOSIS — J4550 Severe persistent asthma, uncomplicated: Secondary | ICD-10-CM

## 2023-06-17 DIAGNOSIS — Z7189 Other specified counseling: Secondary | ICD-10-CM

## 2023-06-17 NOTE — Progress Notes (Unsigned)
HPI Patient presents today to [facility] to see pharmacy team for Dupixent new start. History of allergic asthma and eosinophilic asthma. Last Xolair dose was on 06/02/2023. PMH significant for AV block s/p pacemaker placed, OA  ED visit on 06/02/2023 for asthma flare. Frustrated with communication with our clinic. Currently completing prednisone taper  Respiratory Medications Current regimen: Advair 230-21 mcg (2 puffs twice daily) Tried in past: Symbicort, Trelegy Patient reports no known adherence challenges  OBJECTIVE Allergies  Allergen Reactions   Codeine Itching    Outpatient Encounter Medications as of 06/17/2023  Medication Sig   albuterol (VENTOLIN HFA) 108 (90 Base) MCG/ACT inhaler Inhale 2 puffs into the lungs every 4 (four) hours as needed for wheezing or shortness of breath.   apixaban (ELIQUIS) 5 MG TABS tablet Take 1 tablet (5 mg total) by mouth 2 (two) times daily.   apixaban (ELIQUIS) 5 MG TABS tablet Take 1 tablet (5 mg total) by mouth 2 (two) times daily.   Dupilumab (DUPIXENT) 300 MG/2ML SOPN Inject 600mg  into the at Day 0 then 300mg  into the skin every 14 days thereafter   fluticasone (FLONASE) 50 MCG/ACT nasal spray Place 1 spray into both nostrils daily.   fluticasone-salmeterol (ADVAIR HFA) 230-21 MCG/ACT inhaler Inhale 2 puffs into the lungs 2 (two) times daily.   omalizumab Geoffry Paradise) 150 MG/ML prefilled syringe Inject 300 mg into the skin every 28 (twenty-eight) days. Received at infusion center   predniSONE (DELTASONE) 10 MG tablet Take 4 tablets (40 mg total) by mouth daily with breakfast for 2 days, THEN 3 tablets (30 mg total) daily with breakfast for 2 days, THEN 2 tablets (20 mg total) daily with breakfast for 2 days, THEN 1 tablet (10 mg total) daily with breakfast for 2 days.   No facility-administered encounter medications on file as of 06/17/2023.     Immunization History  Administered Date(s) Administered   DTaP 04/06/1996   Fluad  Quad(high Dose 65+) 07/03/2020   Influenza Split 08/02/2012, 07/05/2013   Influenza Whole 07/03/2016   Influenza-Unspecified 06/16/2019   PFIZER(Purple Top)SARS-COV-2 Vaccination 09/22/2019, 10/13/2019, 06/06/2020   Pneumococcal Conjugate-13 08/31/2008, 05/23/2016   Pneumococcal Polysaccharide-23 11/02/2013   Tdap 09/25/2007, 08/31/2008   Zoster, Live 08/31/2008     PFTs    Latest Ref Rng & Units 10/18/2019    3:01 PM  PFT Results  FVC-Pre L 3.65   FVC-Predicted Pre % 91   FVC-Post L 3.73   FVC-Predicted Post % 93   Pre FEV1/FVC % % 62   Post FEV1/FCV % % 66   FEV1-Pre L 2.27   FEV1-Predicted Pre % 78   FEV1-Post L 2.46   DLCO uncorrected ml/min/mmHg 26.60   DLCO UNC% % 111   DLVA Predicted % 101   TLC L 6.85   TLC % Predicted % 104   RV % Predicted % 128      Eosinophils Most recent blood eosinophil count was 900 cells/microL taken on 06/02/2023.   IgE: 602 on 10/18/21   Assessment   Biologics training for dupilumab (Dupixent)  Goals of therapy: Mechanism: human monoclonal IgG4 antibody that inhibits interleukin-4 and interleukin-13 cytokine-induced responses, including release of proinflammatory cytokines, chemokines, and IgE Reviewed that Dupixent is add-on medication and patient must continue maintenance inhaler regimen. Response to therapy: may take 4 months to determine efficacy. Discussed that patients generally feel improvement sooner than 4 months.  Side effects: injection site reaction (6-18%), antibody development (5-16%), ophthalmic conjunctivitis (2-16%), transient blood eosinophilia (1-2%)  Dose:  600mg  at Week 0 (administered today in clinic) followed by 300mg  every 14 days thereafter  Administration/Storage:  Reviewed administration sites of thigh or abdomen (at least 2-3 inches away from abdomen). Reviewed the upper arm is only appropriate if caregiver is administering injection  Do not shake pen/syringe as this could lead to product foaming or  precipitation. Do not use if solution is discolored or contains particulate matter or if window on prefilled pen is yellow (indicates pen has been used).  Reviewed storage of medication in refrigerator. Reviewed that Dupixent can be stored at room temperature in unopened carton for up to 14 days.  Access: Approval of Dupixent through: insurance Patient enrolled into copay card program to help with copay assistance.  Patient self-administered Dupixent 300mg /60ml x 2 (total dose 600mg ) in right lower abdomen and left lower abdomen using sample: Dupixent 300mg /53mL autoinjector pen NDC: [phone] Lot: [id number] Expiration: 12/30/2024  Patient monitored for 30 minutes for adverse reaction.  Patient tolerated well.  Injection site noted. Patient denies itchiness and irritation at injection., No swelling or redness noted., and Reviewed injection site reaction management with patient verbally and printed information for review in AVS  Medication Reconciliation  A drug regimen assessment was performed, including review of allergies, interactions, disease-state management, dosing and immunization history. Medications were reviewed with the patient, including name, instructions, indication, goals of therapy, potential side effects, importance of adherence, and safe use.  Drug interaction(s): none noted  PLAN Continue Dupixent 300mg  every 14 days.  Next dose is due 07/01/2023 and every 14 days thereafter. Rx sent to: [facility]: [phone].  Patient has already received shipment from pharmacy. Patient assistance is pending but patient unlikely to qualify based on information he provided today about income. Pharmacy team to follow-up regarding PAP final determnation Continue maintenance asthma regimen of: Advair 230-21 mcg (2 puffs twice daily) . Complete prednisone as prescribed. He is to call me directly regarding any issues with Dupixent. Provided him with my direct office number  All  questions encouraged and answered.  Instructed patient to reach out with any further questions or concerns.  Thank you for allowing pharmacy to participate in this patient's care.  This appointment required 45 minutes of patient care (this includes precharting, chart review, review of results, face-to-face care, etc.).   Chesley Mires, PharmD, MPH, BCPS, CPP Clinical Pharmacist (Rheumatology and Pulmonology)

## 2023-06-17 NOTE — Patient Instructions (Addendum)
Your next DUPIXENT dose is due on 07/01/23, 07/15/23, and every 14 days thereafter  CONTINUE Advair 230-21 mcg (2 puffs twice daily)  Your prescription will be shipped from [facility]. Their phone number is [phone]. Please call to schedule shipment and confirm address. They will mail your medication to your home.  If we receive an approval from the patient assistance program, we will contact you so that you do not have to pay for the medication (at least through the end of this year)  Call me with issues about Dupixent [phone]. Leave me a voicemail if needed  You will need to be seen by your provider in 3 to 4 months to assess how DUPIXENT is working for you. Please ensure you have a follow-up appointment scheduled in [place] OR FEBRUARY 2025. Call our clinic if you need to make this appointment.  How to manage an injection site reaction: Remember the 5 C's: COUNTER - leave on the counter at least 30 minutes but up to overnight to bring medication to room temperature. This may help prevent stinging COLD - place something cold (like an ice gel pack or cold water bottle) on the injection site just before cleansing with alcohol. This may help reduce pain CLARITIN - use Claritin (generic name is loratadine) for the first two weeks of treatment or the day of, the day before, and the day after injecting. This will help to minimize injection site reactions CORTISONE CREAM - apply if injection site is irritated and itching CALL ME - if injection site reaction is bigger than the size of your fist, looks infected, blisters, or if you develop hives

## 2023-06-18 MED ORDER — DUPIXENT 300 MG/2ML ~~LOC~~ SOAJ
SUBCUTANEOUS | 0 refills | Status: DC
Start: 2023-06-18 — End: 2023-09-17

## 2023-06-18 NOTE — Telephone Encounter (Signed)
Called [employer] for update on PAP application. Per rep, they don't have rx on file at all for Dupixent. I have once again sent in rx to [facility] electronically  Chesley Mires, PharmD, MPH, BCPS, CPP Clinical Pharmacist (Rheumatology and Pulmonology)

## 2023-06-24 NOTE — Telephone Encounter (Signed)
Received a fax from  Dupixent [employer] regarding an approval for DUPIXENT patient assistance from 06/24/2023 to 09/02/2023. Approval letter sent to scan center.  Phone #: [phone] Fax #: [phone]  ATC patient to discuss. Unable to reach. LeftVM. Mychart message sent to pt  David [name], PharmD, MPH, BCPS, CPP Clinical Pharmacist (Rheumatology and Pulmonology)

## 2023-06-24 NOTE — Telephone Encounter (Signed)
Email sent to David [name], for update on case

## 2023-06-27 ENCOUNTER — Encounter: Payer: Self-pay | Admitting: Cardiology

## 2023-06-30 ENCOUNTER — Ambulatory Visit: Payer: Medicare Other

## 2023-07-02 ENCOUNTER — Encounter: Payer: Medicare Other | Admitting: Cardiology

## 2023-07-02 DIAGNOSIS — R197 Diarrhea, unspecified: Secondary | ICD-10-CM | POA: Diagnosis not present

## 2023-07-02 DIAGNOSIS — Z1211 Encounter for screening for malignant neoplasm of colon: Secondary | ICD-10-CM | POA: Diagnosis not present

## 2023-07-02 DIAGNOSIS — J45909 Unspecified asthma, uncomplicated: Secondary | ICD-10-CM | POA: Diagnosis not present

## 2023-07-03 ENCOUNTER — Other Ambulatory Visit: Payer: Self-pay | Admitting: Gastroenterology

## 2023-07-22 ENCOUNTER — Encounter: Payer: Self-pay | Admitting: Cardiology

## 2023-07-22 ENCOUNTER — Ambulatory Visit: Payer: Medicare Other | Attending: Cardiology | Admitting: Cardiology

## 2023-07-22 VITALS — BP 134/94 | HR 70 | Ht 68.0 in | Wt 166.4 lb

## 2023-07-22 DIAGNOSIS — D6869 Other thrombophilia: Secondary | ICD-10-CM | POA: Diagnosis not present

## 2023-07-22 DIAGNOSIS — I442 Atrioventricular block, complete: Secondary | ICD-10-CM

## 2023-07-22 DIAGNOSIS — I483 Typical atrial flutter: Secondary | ICD-10-CM | POA: Diagnosis not present

## 2023-07-22 DIAGNOSIS — I480 Paroxysmal atrial fibrillation: Secondary | ICD-10-CM | POA: Diagnosis not present

## 2023-07-22 NOTE — Progress Notes (Signed)
  Electrophysiology Office Note:   Date:  07/22/2023  ID:  David [name], DOB 06-18-1946, MRN [id number]  Primary Cardiologist: None Electrophysiologist: Timberlynn Kizziah [name], MD      History of Present Illness:   David [name] is a 77 y.o. male with h/o atrial fibrillation, atrial flutter, complete heart block seen today for routine electrophysiology followup.   Since last being seen in our clinic the patient reports doing overall well.  Since his pacemaker was implanted, he has noted no change in his exercise tolerance.  He continues to be able to do all of his daily activities.  He did have atrial fibrillation and presented to the emergency room.  He was in sinus rhythm on presentation.  He has been started on Eliquis.  he denies chest pain, palpitations, dyspnea, PND, orthopnea, nausea, vomiting, dizziness, syncope, edema, weight gain, or early satiety.   Review of systems complete and found to be negative unless listed in HPI.      EP Information / Studies Reviewed:    EKG is ordered today. Personal review as below.  EKG Interpretation Date/Time:  Tuesday July 22 2023 15:59:41 EST Ventricular Rate:  70 PR Interval:  134 QRS Duration:  146 QT Interval:  446 QTC Calculation: 481 R Axis:   84  Text Interpretation: Atrial-sensed ventricular-paced rhythm When compared with ECG of 02-Jun-2023 19:27, Sinus rhythm Confirmed by Kasten Leveque ([id number]) on 07/22/2023 4:14:33 PM   PPM Interrogation-  reviewed in detail today,  See PACEART report.  Device History: Medtronic Dual Chamber PPM implanted 02/25/2023 with RV lead revision 03/12/2023 for CHB  Risk Assessment/Calculations:    CHA2DS2-VASc Score = 2   This indicates a 2.2% annual risk of stroke. The patient's score is based upon: CHF History: 0 HTN History: 0 Diabetes History: 0 Stroke History: 0 Vascular Disease History: 0 Age Score: 2 Gender Score: 0            Physical Exam:   VS:  BP (!) 134/94 (BP Location:  Left Arm, Patient Position: Sitting, Cuff Size: Normal)   Pulse 70   Ht 5\' 8"  (1.727 m)   Wt 166 lb 6.4 oz (75.5 kg)   SpO2 96%   BMI 25.30 kg/m    Wt Readings from Last 3 Encounters:  07/22/23 166 lb 6.4 oz (75.5 kg)  06/16/23 163 lb 6.4 oz (74.1 kg)  06/02/23 159 lb 12.8 oz (72.5 kg)     GEN: Well nourished, well developed in no acute distress NECK: No JVD; No carotid bruits CARDIAC: Regular rate and rhythm, no murmurs, rubs, gallops RESPIRATORY:  Clear to auscultation without rales, wheezing or rhonchi  ABDOMEN: Soft, non-tender, non-distended EXTREMITIES:  No edema; No deformity   ASSESSMENT AND PLAN:    CHB s/p Medtronic PPM  Normal PPM function See Pace Art report No changes today  2.  Typical atrial flutter: Post ablation 02/24/2019.  No obvious recurrence.  3.  Paroxysmal atrial fibrillation/atypical atrial flutter: Patient is minimally symptomatic.  Currently on no rate controlling medications due to heart block.  4.  Secondary hypercoagulable state: Currently on Eliquis for atrial fibrillation  Disposition:   Follow up with Dr. Elberta Fortis in 12 months  Signed, Rini Moffit [name], MD

## 2023-08-12 ENCOUNTER — Encounter (HOSPITAL_BASED_OUTPATIENT_CLINIC_OR_DEPARTMENT_OTHER): Payer: Self-pay | Admitting: Pulmonary Disease

## 2023-08-28 ENCOUNTER — Ambulatory Visit (INDEPENDENT_AMBULATORY_CARE_PROVIDER_SITE_OTHER): Payer: Medicare Other

## 2023-08-28 DIAGNOSIS — I442 Atrioventricular block, complete: Secondary | ICD-10-CM

## 2023-08-28 LAB — CUP PACEART REMOTE DEVICE CHECK
Battery Remaining Longevity: 132 mo
Battery Voltage: 3.11 V
Brady Statistic AP VP Percent: 20.08 %
Brady Statistic AP VS Percent: 0 %
Brady Statistic AS VP Percent: 79.83 %
Brady Statistic AS VS Percent: 0.08 %
Brady Statistic RA Percent Paced: 19.97 %
Brady Statistic RV Percent Paced: 99.9 %
Date Time Interrogation Session: 20241225210645
Implantable Lead Connection Status: 753985
Implantable Lead Connection Status: 753985
Implantable Lead Implant Date: 20240625
Implantable Lead Implant Date: 20240710
Implantable Lead Location: 753859
Implantable Lead Location: 753860
Implantable Lead Model: 3830
Implantable Lead Model: 5076
Implantable Pulse Generator Implant Date: 20240625
Lead Channel Impedance Value: 304 Ohm
Lead Channel Impedance Value: 304 Ohm
Lead Channel Impedance Value: 437 Ohm
Lead Channel Impedance Value: 551 Ohm
Lead Channel Pacing Threshold Amplitude: 0.75 V
Lead Channel Pacing Threshold Amplitude: 0.875 V
Lead Channel Pacing Threshold Pulse Width: 0.4 ms
Lead Channel Pacing Threshold Pulse Width: 0.4 ms
Lead Channel Sensing Intrinsic Amplitude: 12.25 mV
Lead Channel Sensing Intrinsic Amplitude: 12.25 mV
Lead Channel Sensing Intrinsic Amplitude: 2.25 mV
Lead Channel Sensing Intrinsic Amplitude: 2.25 mV
Lead Channel Setting Pacing Amplitude: 1.5 V
Lead Channel Setting Pacing Amplitude: 2.5 V
Lead Channel Setting Pacing Pulse Width: 0.4 ms
Lead Channel Setting Sensing Sensitivity: 0.9 mV
Zone Setting Status: 755011

## 2023-09-05 ENCOUNTER — Telehealth: Payer: Self-pay | Admitting: Pulmonary Disease

## 2023-09-05 DIAGNOSIS — J4550 Severe persistent asthma, uncomplicated: Secondary | ICD-10-CM

## 2023-09-05 NOTE — Telephone Encounter (Signed)
 Patient states company would not send him form for Dupixent. Our office can get the form by [email or web address] Patient phone number is [phone].

## 2023-09-05 NOTE — Telephone Encounter (Signed)
 Patient has not been seen in clinic since starting Dupixent. Will need updated OV note from 09/17/2023 to submit for continuation of treatment with Dupixent.  Chesley Mires, PharmD, MPH, BCPS, CPP Clinical Pharmacist (Rheumatology and Pulmonology)

## 2023-09-05 NOTE — Telephone Encounter (Signed)
 Spoke to patient about dupixent  for 2025. He states that he has been told that he needs to fill out new paperwork to continue to get it at reduced cost. He states that he has new insurance he doesn't have a card yet but he has the numbers if you need them please call patient. He has one last shot to take Tuesday and then he will be out.

## 2023-09-05 NOTE — Telephone Encounter (Signed)
 Patient will not be approved for pt assistance due to income and he is ok with that. He is asking that we just send the order form so he doesn't have to go without. His new insurance is [facility] Value Script/BCBS   ID: [id number] Plan: [id number] BIN: [id number] PCN: [id number] GRP: [id number]  He is unsure what pharmacy it will need to go to but if its mailed he needs it sent to [address] [place], [place] [address]

## 2023-09-16 ENCOUNTER — Encounter (HOSPITAL_BASED_OUTPATIENT_CLINIC_OR_DEPARTMENT_OTHER): Payer: Self-pay

## 2023-09-17 ENCOUNTER — Other Ambulatory Visit (HOSPITAL_COMMUNITY): Payer: Self-pay

## 2023-09-17 ENCOUNTER — Ambulatory Visit (HOSPITAL_BASED_OUTPATIENT_CLINIC_OR_DEPARTMENT_OTHER): Payer: Medicare Other | Admitting: Pulmonary Disease

## 2023-09-17 ENCOUNTER — Encounter (HOSPITAL_BASED_OUTPATIENT_CLINIC_OR_DEPARTMENT_OTHER): Payer: Self-pay | Admitting: Pulmonary Disease

## 2023-09-17 VITALS — BP 120/86 | HR 61 | Resp 16 | Ht 68.0 in | Wt 167.7 lb

## 2023-09-17 DIAGNOSIS — J4550 Severe persistent asthma, uncomplicated: Secondary | ICD-10-CM

## 2023-09-17 MED ORDER — DUPIXENT 300 MG/2ML ~~LOC~~ SOAJ
300.0000 mg | SUBCUTANEOUS | 3 refills | Status: DC
  Filled 2023-09-19: qty 8, 56d supply, fill #0
  Filled 2023-11-03: qty 8, 56d supply, fill #1
  Filled 2023-12-31: qty 8, 56d supply, fill #2
  Filled 2024-02-27: qty 8, 56d supply, fill #3

## 2023-09-17 NOTE — Telephone Encounter (Addendum)
 Received notification from [employer] regarding a prior authorization for DUPIXENT . Authorization has been APPROVED from 09/17/2023 until further notice. Approval letter sent to scan center.  Per test claim, copay for 56 days supply is $2000  Patient can fill through [facility]: [phone]   Authorization # [id number]  Rx sent to [facility] today for onboarding. Once patient pays $2000, copays for all medications through insurance will drop to $0 ATC patient regarding this. Unable to reach. Left VM requesting return call to confirm he is okay with this copay  Geraldene Kleine, PharmD, MPH, BCPS, CPP Clinical Pharmacist (Rheumatology and Pulmonology)

## 2023-09-17 NOTE — Patient Instructions (Signed)
 Mild persistent asthma, previously steroid dependent - well controlled Eosinophilic asthma --Dupixent  since 06/17/23. Continue as scheduled --CONTINUE Advair 230-21 mcg 1-2 puffs TWICE a day --CONTINUE Albuterol  AS NEEDED for shortness of breath

## 2023-09-17 NOTE — Progress Notes (Signed)
 Synopsis: Referred in January 2021 for Asthma. Former patient of Dr. Leigh Punch. David [name] Subjective:   PATIENT ID: David [name] GENDER: male DOB: 13-Jul-1946, MRN: [id number]   HPI  Chief Complaint  Patient presents with   Follow-up    Asthma. Needs paperwork for Dupixent . He is due next Tuesday but is out.   David [name] is a 78 year old male never smoker with severe persistent asthma who presents for follow-up.  Synopsis: He started omalizumab  11/29/19 with significant improvement in his symptoms and has stop using his inhaler on a daily basis. He is using Trelegy ellipta  as needed, maybe a couple times per month.  He was diagnosed with asthma 20 years ago. He was has been on Advair, Symbicort  and Trelegy in the past. He was started on Xolair  in 2021 however he feels recently it has been less effective. Did not tolerate Symbicort  due to cough during inhalation. Trelegy has been starting become less effective as well. He currently has coughing, wheezing and shortness of breath and awakening at night. He is currently on prednisone  and symptoms will recur when he is off steroids. He has required steroid tapers six times in the last year.  01/15/22 Since our last visit inhalers were changed to medium dose Advair HFA. We discussed transitioning to Dupixent  on telephone encounters however he has had improved symptoms on Xolair  so decided to continue this biologic. He reports he is overall doing well on Xolair  and Advair. Not needing albuterol  inhaler. Denies shortness of breath, wheezing or coughing. No nocturnal symptoms.  04/29/22 He is planning for left total shoulder arthroplasty on 05/16/2022. Last exacerbation in 10/2021. Currently on Advair ONE puff TWICE a day due to a cough/sore throat. Reports symptoms are overall well controlled. No nocturnal symptoms. On Xolair . Does report mild cough that usually leads to worsening respiratory symptoms if not addressed.  10/30/22 Since our last visit he  was seen in [facility] ED for chest tightness and sore throat. He was treated with duonebs and prednisone  60 mg. Had hypoxemia to upper 80s. C/b atrial flutter. Was started on anticoagulation by ED but this was determined not to continue by his cardiologist. He had paused Advair use for several months. In December he had a URI.   04/29/23 He reports recent covid infection exposure two days ago at a birthday. Denies any current respiratory symptoms related to this. This summer he has been treated for asthma exacerbation in June. 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 Before his cardiac issues,s he reports his asthma symptoms are have worsened with shortness of breath, chest tightness, cough, nightly awakenings and cough and seem to flare up every 1-2 months. He is intermittently taking inhalers mainly due to it not being as effective as before. He reports coughing with his inhaler and is frustrated.  06/16/23 Since our last visit he was seen the ED on 06/02/23 for atrial fibrillation. He has been having issues with his pacemaker and noted to be in atrial flutter. Started on eliquis  by Cardiology. He was noted to have hypoxemia to 85-88% with exertion but resolved/normalized with rest. In the ED CTA neg for PE. He was treated with IV decadron  followed by prednisone   09/17/23 Since our last visit he has been on Dupixent . He has been compliant with bronchodilators and has had significantly improved symptoms while on biologic. Denies shortness of breath, cough or wheezing since October 2024. Denies nocturnal symptoms. No exacerbations since our last visit. No recent use  of rescue inhalers. Had mild flat rash on his legs not any worse.   Asthma Control Test ACT Total Score  09/17/2023  9:57 AM 24  04/29/2023  2:08 PM 10  10/30/2022 10:30 AM 12    Past Medical History:  Diagnosis Date   Altitude sickness    Asthma    exercise-induced; prn inhaler   AV block, 2nd degree    Bilateral  inguinal hernia (BIH) 10/16/2012   Overview:  [facility] -Dr. Afton Horse.  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 8 g 11   apixaban  (ELIQUIS ) 5 MG TABS tablet Take 1 tablet (5 mg total) by mouth 2 (two) times daily.     apixaban  (ELIQUIS ) 5 MG TABS tablet Take 1 tablet (5 mg total) by mouth 2 (two) times daily. 60 tablet 0   Dupilumab  (DUPIXENT ) 300 MG/2ML SOPN Inject 600mg  into the at Day 0 then 300mg  into the skin every 14 days thereafter 12 mL 0   fluticasone  (FLONASE ) 50 MCG/ACT nasal spray Place 1 spray into both nostrils daily. 18.2 mL 2   fluticasone -salmeterol (ADVAIR HFA) 230-21 MCG/ACT inhaler Inhale 2 puffs into the lungs 2 (two) times daily. 12 g 5   omalizumab  (XOLAIR ) 150 MG/ML prefilled syringe Inject 300 mg into the skin every 28 (twenty-eight) days. Received at infusion center     No facility-administered medications prior to visit.    Review of Systems  Constitutional:  Negative for chills, diaphoresis, fever, malaise/fatigue and weight loss.  HENT:  Negative for congestion.   Respiratory:  Negative for cough, hemoptysis, sputum production, shortness of breath and wheezing.   Cardiovascular:  Negative for chest pain, palpitations and leg swelling.    Objective:   Vitals:    09/17/23 0957  BP: 120/86  Pulse: 61  Resp: 16  SpO2: 96%  Weight: 167 lb 11.2 oz (76.1 kg)  Height: 5\' 8"  (1.727 m)    Physical Exam: General: Well-appearing, no acute distress HENT: [place], AT Eyes: EOMI, no scleral icterus Respiratory: Clear to auscultation bilaterally.  No crackles, wheezing or rales Cardiovascular: RRR, -M/R/G, no JVD Extremities:-Edema,-tenderness Neuro: AAO x4, CNII-XII grossly intact Psych: Normal mood, normal affect  Data Reviewed  Chest imaging: CTA 04/05/18 - No PE. Minimal bibasilar atelectasis. CXR 09/11/20 - lingular scar. Cardiomegaly. CXR 10/24/21 - Linular scar. Unchanged cardiomegaly CXR 03/12/23 - Left basilar scarring. Cardiomegaly with dual lead AICD CTA 06/02/23 - No PE. Left basilar scarring  PFT  10/18/19 FVC 3.73 (93%) FEV1 2.46 (84%) Ratio 62 TLC 104% DLCO 111% Interpretation: On bronchodilators (Spiriva ). Mild obstructive defect. No significant bronchodilator response however does not preclude benefit of therapy.  Labs IgE  09/29/19 -137 03/22/20 - 297 10/18/21 - 602 02/08/23 - 900 06/02/23 - 900  Absolute eos 02/08/23 - 900    Assessment & Plan:   Discussion: 78 year old male never smoker with severe persistent asthma on Dupixent  who presents for follow-up. Previously failed Xolair  with persistent symptoms and exacerbations including  in Feb, Aug and December 2023 and June and September in 2024. Currently on Dupixent  and do significantly well with no rescue inhaler. Compliant with bronchodilators.   Previously failed Symbicort  (cough) and Trelegy (ineffective).   Mild persistent asthma, previously steroid dependent - well controlled Eosinophilic asthma --Dupixent  since 06/17/23. Continue as scheduled --CONTINUE Advair 230-21 mcg 1-2 puffs TWICE a day --CONTINUE Albuterol  AS NEEDED for shortness of breath  Return in about 3 months (around 12/16/2023).   I have spent a total time of 30-minutes on the day of the appointment including  chart review, data review, collecting history, coordinating care and discussing medical diagnosis and plan with the patient/family. Past medical history, allergies, medications were reviewed. Pertinent imaging, labs and tests included in this note have been reviewed and interpreted independently by me.  Genetta Kenning, MD [facility]   Current Outpatient Medications:    albuterol  (VENTOLIN  HFA) 108 (90 Base) MCG/ACT inhaler, Inhale 2 puffs into the lungs every 4 (four) hours as needed for wheezing or shortness of breath., Disp: 8 g, Rfl: 11   apixaban  (ELIQUIS ) 5 MG TABS tablet, Take 1 tablet (5 mg total) by mouth 2 (two) times daily., Disp: , Rfl:    apixaban  (ELIQUIS ) 5 MG TABS tablet, Take 1 tablet (5 mg total) by mouth 2 (two) times daily., Disp: 60 tablet, Rfl: 0   Dupilumab  (DUPIXENT ) 300 MG/2ML SOPN, Inject 600mg  into the at Day 0 then 300mg  into the skin every 14 days thereafter, Disp: 12 mL, Rfl: 0   fluticasone  (FLONASE ) 50 MCG/ACT nasal spray, Place 1 spray into both nostrils daily., Disp: 18.2 mL, Rfl: 2   fluticasone -salmeterol (ADVAIR HFA) 230-21 MCG/ACT inhaler, Inhale 2 puffs into the lungs 2 (two) times daily., Disp: 12 g, Rfl: 5

## 2023-09-17 NOTE — Telephone Encounter (Signed)
 Submitted an URGENT Prior Authorization request to [facility] for DUPIXENT  via CoverMyMeds. Will update once we receive a response.  Key: [id number]  Per Dr. Washington Hacker patient due for next dose on Tuesday, 09/23/2023

## 2023-09-19 ENCOUNTER — Telehealth: Payer: Self-pay | Admitting: *Deleted

## 2023-09-19 ENCOUNTER — Other Ambulatory Visit: Payer: Self-pay

## 2023-09-19 ENCOUNTER — Other Ambulatory Visit (HOSPITAL_COMMUNITY): Payer: Self-pay

## 2023-09-19 NOTE — Progress Notes (Signed)
Specialty Pharmacy Initial Fill Coordination Note  David [name] is a 78 y.o. male contacted today regarding initial fill of specialty medication(s) Dupilumab (Dupixent)   Patient requested Delivery   Delivery date: 09/23/23   Verified address: [address], [place] [address]   Medication will be filled on 09/22/23.   Patient is aware of $2,000 copayment. CC info provided to [facility] at [facility].

## 2023-09-19 NOTE — Telephone Encounter (Signed)
Pharmacy please advise on holding Eliquis prior to colonoscopy scheduled for 10/03/2023. Thank you.

## 2023-09-19 NOTE — Telephone Encounter (Signed)
   Pre-operative Risk Assessment    Patient Name: David [name]  DOB: Feb 13, 1946 MRN: [id number]   Date of last office visit: 07/22/23 DR. CAMNITZ Date of next office visit: NONE   Request for Surgical Clearance    Procedure:  COLONOSCOPY  Date of Surgery:  Clearance 10/03/23                                Surgeon:  DR. HUNG Surgeon's Group or Practice Name:  [facility] Phone number:  [phone] Fax number:  [phone]   Type of Clearance Requested:   - Medical  - Pharmacy:  Hold Apixaban (Eliquis)     Type of Anesthesia:   PROPOFOL   Additional requests/questions:    Elpidio Anis   09/19/2023, 11:23 AM

## 2023-09-20 NOTE — Progress Notes (Signed)
Patient no longer eligible for [employer] patient assistance program due to income. Started Dupixent in clinic on 06/17/2023 and has had clinical improvement since then.  Clinically doing well on treatment Also on Advair 2 puffs twice daily  Chesley Mires, PharmD, MPH, BCPS, CPP Clinical Pharmacist (Rheumatology and Pulmonology)

## 2023-09-22 ENCOUNTER — Other Ambulatory Visit: Payer: Self-pay

## 2023-09-22 NOTE — Telephone Encounter (Signed)
Patient with diagnosis of atrial fibrillation on Eliquis for anticoagulation.    Procedure: colonoscopy Date of procedure: 10/03/23   CHA2DS2-VASc Score = 2   This indicates a 2.2% annual risk of stroke. The patient's score is based upon: CHF History: 0 HTN History: 0 Diabetes History: 0 Stroke History: 0 Vascular Disease History: 0 Age Score: 2 Gender Score: 0    CrCl 71 Platelet count 215  Per office protocol, patient can hold Eliquis for 2 days prior to procedure.   Patient will not need bridging with Lovenox (enoxaparin) around procedure.  **This guidance is not considered finalized until pre-operative APP has relayed final recommendations.**

## 2023-09-23 NOTE — Telephone Encounter (Signed)
Left message to call back to schedule tele pre op appt.  

## 2023-09-23 NOTE — Telephone Encounter (Signed)
   Name: David [name]  DOB: [birth year]  MRN: [id number]  Primary Cardiologist: None   Preoperative team, please contact this patient and set up a phone call appointment for further preoperative risk assessment. Please obtain consent and complete medication review. Thank you for your help.  I confirm that guidance regarding antiplatelet and oral anticoagulation therapy has been completed and, if necessary, noted below.  Per office protocol, patient can hold Eliquis for 2 days prior to procedure.   Patient will not need bridging with Lovenox (enoxaparin) around procedure.  I also confirmed the patient resides in the state of [place]. As per [facility] Medical Board telemedicine laws, the patient must reside in the state in which the provider is licensed.   Joylene Grapes, NP 09/23/2023, 8:36 AM [facility]

## 2023-09-24 NOTE — Telephone Encounter (Signed)
2nd attempt to reach the pt to set up tele preop appt. I will send FYI to Dr. Elnoria Howard as well.

## 2023-09-25 ENCOUNTER — Encounter (HOSPITAL_COMMUNITY): Payer: Self-pay | Admitting: Gastroenterology

## 2023-09-26 NOTE — Telephone Encounter (Signed)
Attempted to Contact Surgeon office but staff  gone for day

## 2023-09-26 NOTE — Telephone Encounter (Addendum)
3rd Attempt  Lmtcb  to schedule appointment Notifying  Surgeon Office

## 2023-09-27 ENCOUNTER — Encounter: Payer: Self-pay | Admitting: Cardiology

## 2023-09-29 ENCOUNTER — Telehealth: Payer: Self-pay | Admitting: *Deleted

## 2023-09-29 ENCOUNTER — Encounter: Payer: Self-pay | Admitting: Physician Assistant

## 2023-09-29 ENCOUNTER — Ambulatory Visit: Payer: Medicare Other | Attending: Physician Assistant | Admitting: Physician Assistant

## 2023-09-29 DIAGNOSIS — Z01810 Encounter for preprocedural cardiovascular examination: Secondary | ICD-10-CM

## 2023-09-29 NOTE — Telephone Encounter (Signed)
Our office has attempted x 3. Today is x 4. Pt did reply back through [facility] CHART. I left a vm today about needing tele and that I will send him a message through [facility] CHART. Our office will do all that we can to help accommodate the pt as to not delay his procedure.

## 2023-09-29 NOTE — Progress Notes (Signed)
   Virtual Visit via Telephone Note   Because of David [name]'s co-morbid illnesses, he is at least at moderate risk for complications without adequate follow up.  This format is felt to be most appropriate for this patient at this time.  The patient did not have access to video technology/had technical difficulties with video requiring transitioning to audio format only (telephone).  All issues noted in this document were discussed and addressed.  No physical exam could be performed with this format.  Please refer to the patient's chart for his consent to telehealth for [facility].  Evaluation Performed:  Preoperative cardiovascular risk assessment _____________   Date:  09/29/2023   Patient ID:  David [name], DOB Dec 12, 1945, MRN [id number] Patient Location:  Home Provider location:   Office  Primary Care Provider:  Deloris Ping, MD Primary Cardiologist:  None  Chief Complaint / Patient Profile   78 y.o. y/o male with a h/o   Paroxysmal atrial fibrillation Atrial flutter s/p ablation 02/2019 Complete heart block S/p pacemaker Hx of DVT after knee surgery LHC 04/06/2018: No CAD TTE 8.19: EF 55-60, trivial TR  who is pending colonoscopy 10/03/23 with Dr. Elnoria Howard under conscious sedation and presents today for telephonic preoperative cardiovascular risk assessment.  Our Pharm.D. team has reviewed his history to help make recommendations regarding holding anticoagulation. Per office protocol, patient can hold Eliquis for 2 days prior to procedure. Patient will not need bridging with Lovenox (enoxaparin) around procedure   History of Present Illness    David [name] is a 78 y.o. male who presents via audio/video conferencing for a telehealth visit today.  Pt was last seen in cardiology clinic on 07/22/2023 by Dr. Elberta Fortis.  At that time David [name] was doing well.  The patient is now pending procedure as outlined above. Since his last visit, he is doing well.  He has not  had chest pain, shortness of breath.     Physical Exam    Vital Signs:  David [name] does not have vital signs available for review today.   Given telephonic nature of communication, physical exam is limited. AAOx3. NAD. Normal affect.  Speech and respirations are unlabored.  Accessory Clinical Findings    None    Assessment & Plan   Assessment & Plan Preoperative cardiovascular examination David [name] perioperative risk of a major cardiac event is 0.4% according to the Revised Cardiac Risk Index (RCRI).  Therefore, he is at low risk for perioperative complications.   His functional capacity is good at 4.31 METs according to the Duke Activity Status Index (DASI). Recommendations: According to ACC/AHA guidelines, no further cardiovascular testing needed.  The patient may proceed to surgery at acceptable risk.   Antiplatelet and/or Anticoagulation Recommendations: Eliquis (Apixaban) can be held for 2 days prior to surgery.  Please resume post op when felt to be safe.     A copy of this note will be routed to requesting surgeon.  Time:   Today, I have spent 6 minutes with the patient with telehealth technology discussing medical history, symptoms, and management plan.     Tereso Newcomer, PA-C  09/29/2023, 8:26 PM

## 2023-09-29 NOTE — Telephone Encounter (Signed)
Pt has been scheduled tele preop appt add on per Tereso Newcomer, [facility] 09/29/23. Med rec and consnet are done.

## 2023-09-29 NOTE — Telephone Encounter (Signed)
Pt has been scheduled tele preop appt add on per Tereso Newcomer, [facility] 09/29/23. Med rec and consnet are done.      Patient Consent for Virtual Visit        David [name] has provided verbal consent on 09/29/2023 for a virtual visit (video or telephone).   CONSENT FOR VIRTUAL VISIT FOR:  David [name]  By participating in this virtual visit I agree to the following:  I hereby voluntarily request, consent and authorize [facility] and its employed or contracted physicians, physician assistants, nurse practitioners or other licensed health care professionals (the Practitioner), to provide me with telemedicine health care services (the "Services") as deemed necessary by the treating Practitioner. I acknowledge and consent to receive the Services by the Practitioner via telemedicine. I understand that the telemedicine visit will involve communicating with the Practitioner through live audiovisual communication technology and the disclosure of certain medical information by electronic transmission. I acknowledge that I have been given the opportunity to request an in-person assessment or other available alternative prior to the telemedicine visit and am voluntarily participating in the telemedicine visit.  I understand that I have the right to withhold or withdraw my consent to the use of telemedicine in the course of my care at any time, without affecting my right to future care or treatment, and that the Practitioner or I may terminate the telemedicine visit at any time. I understand that I have the right to inspect all information obtained and/or recorded in the course of the telemedicine visit and may receive copies of available information for a reasonable fee.  I understand that some of the potential risks of receiving the Services via telemedicine include:  Delay or interruption in medical evaluation due to technological equipment failure or disruption; Information transmitted may not be  sufficient (e.g. poor resolution of images) to allow for appropriate medical decision making by the Practitioner; and/or  In rare instances, security protocols could fail, causing a breach of personal health information.  Furthermore, I acknowledge that it is my responsibility to provide information about my medical history, conditions and care that is complete and accurate to the best of my ability. I acknowledge that Practitioner's advice, recommendations, and/or decision may be based on factors not within their control, such as incomplete or inaccurate data provided by me or distortions of diagnostic images or specimens that may result from electronic transmissions. I understand that the practice of medicine is not an exact science and that Practitioner makes no warranties or guarantees regarding treatment outcomes. I acknowledge that a copy of this consent can be made available to me via my patient portal [facility] MyChart), or I can request a printed copy by calling the office of [facility].    I understand that my insurance will be billed for this visit.   I have read or had this consent read to me. I understand the contents of this consent, which adequately explains the benefits and risks of the Services being provided via telemedicine.  I have been provided ample opportunity to ask questions regarding this consent and the Services and have had my questions answered to my satisfaction. I give my informed consent for the services to be provided through the use of telemedicine in my medical care

## 2023-09-29 NOTE — Telephone Encounter (Signed)
Pt returning call Tereso Newcomer, PA-C    09/29/2023 8:10 AM

## 2023-10-01 ENCOUNTER — Encounter (HOSPITAL_COMMUNITY): Payer: Self-pay | Admitting: Gastroenterology

## 2023-10-02 NOTE — Anesthesia Preprocedure Evaluation (Addendum)
Anesthesia Evaluation  Patient identified by MRN, date of birth, ID band Patient awake    Reviewed: Allergy & Precautions, NPO status , Patient's Chart, lab work & pertinent test results  Airway Mallampati: II  TM Distance: >3 FB Neck ROM: Full    Dental  (+) Teeth Intact, Dental Advisory Given   Pulmonary asthma (advair BID, used this AM)    Pulmonary exam normal breath sounds clear to auscultation       Cardiovascular hypertension (160/87 preop, normally 110s SBP), + DVT  Normal cardiovascular exam+ dysrhythmias (eliquis) Atrial Fibrillation + pacemaker (CHB s/p ppm 02/2023, revised in july 2024)  Rhythm:Regular Rate:Normal  Echo 2019 - Left ventricle: The cavity size was normal. Wall thickness was    normal. Systolic function was normal. The estimated ejection    fraction was in the range of 55% to 60%. Wall motion was normal;    there were no regional wall motion abnormalities.  - Aortic valve: Trileaflet; mildly thickened, mildly calcified    leaflets.  - Right ventricle: The cavity size was mildly dilated. Wall    thickness was normal.  - Tricuspid valve: There was trivial regurgitation.     Neuro/Psych negative neurological ROS  negative psych ROS   GI/Hepatic negative GI ROS, Neg liver ROS,,,  Endo/Other  negative endocrine ROS    Renal/GU negative Renal ROS  negative genitourinary   Musculoskeletal  (+) Arthritis , Osteoarthritis,    Abdominal   Peds  Hematology negative hematology ROS (+)   Anesthesia Other Findings   Reproductive/Obstetrics negative OB ROS                              Anesthesia Physical Anesthesia Plan  ASA: 3  Anesthesia Plan: MAC   Post-op Pain Management:    Induction:   PONV Risk Score and Plan: 2 and Propofol infusion and TIVA  Airway Management Planned: Natural Airway and Simple Face Mask  Additional Equipment: None  Intra-op Plan:    Post-operative Plan:   Informed Consent: I have reviewed the patients History and Physical, chart, labs and discussed the procedure including the risks, benefits and alternatives for the proposed anesthesia with the patient or authorized representative who has indicated his/her understanding and acceptance.       Plan Discussed with: CRNA  Anesthesia Plan Comments:          Anesthesia Quick Evaluation

## 2023-10-03 ENCOUNTER — Ambulatory Visit (HOSPITAL_COMMUNITY)
Admission: RE | Admit: 2023-10-03 | Discharge: 2023-10-03 | Disposition: A | Discharge status: Home / Self Care | Payer: Medicare Other | Attending: Gastroenterology | Admitting: Gastroenterology

## 2023-10-03 ENCOUNTER — Encounter (HOSPITAL_COMMUNITY)
Admission: RE | Disposition: A | Discharge status: Home / Self Care | Payer: Self-pay | Source: Home / Self Care | Attending: Gastroenterology

## 2023-10-03 ENCOUNTER — Ambulatory Visit (HOSPITAL_COMMUNITY): Payer: Medicare Other | Admitting: Anesthesiology

## 2023-10-03 ENCOUNTER — Other Ambulatory Visit: Payer: Self-pay

## 2023-10-03 ENCOUNTER — Encounter (HOSPITAL_COMMUNITY): Payer: Self-pay | Admitting: Gastroenterology

## 2023-10-03 DIAGNOSIS — J45909 Unspecified asthma, uncomplicated: Secondary | ICD-10-CM | POA: Insufficient documentation

## 2023-10-03 DIAGNOSIS — M1990 Unspecified osteoarthritis, unspecified site: Secondary | ICD-10-CM | POA: Diagnosis not present

## 2023-10-03 DIAGNOSIS — I10 Essential (primary) hypertension: Secondary | ICD-10-CM | POA: Insufficient documentation

## 2023-10-03 DIAGNOSIS — Z7901 Long term (current) use of anticoagulants: Secondary | ICD-10-CM | POA: Diagnosis not present

## 2023-10-03 DIAGNOSIS — Z860100 Personal history of colon polyps, unspecified: Secondary | ICD-10-CM | POA: Diagnosis not present

## 2023-10-03 DIAGNOSIS — Z86718 Personal history of other venous thrombosis and embolism: Secondary | ICD-10-CM | POA: Diagnosis not present

## 2023-10-03 DIAGNOSIS — Z1211 Encounter for screening for malignant neoplasm of colon: Secondary | ICD-10-CM

## 2023-10-03 DIAGNOSIS — Z79899 Other long term (current) drug therapy: Secondary | ICD-10-CM | POA: Diagnosis not present

## 2023-10-03 DIAGNOSIS — I4891 Unspecified atrial fibrillation: Secondary | ICD-10-CM | POA: Insufficient documentation

## 2023-10-03 DIAGNOSIS — Z950 Presence of cardiac pacemaker: Secondary | ICD-10-CM | POA: Insufficient documentation

## 2023-10-03 DIAGNOSIS — K5730 Diverticulosis of large intestine without perforation or abscess without bleeding: Secondary | ICD-10-CM | POA: Diagnosis not present

## 2023-10-03 DIAGNOSIS — I442 Atrioventricular block, complete: Secondary | ICD-10-CM | POA: Insufficient documentation

## 2023-10-03 DIAGNOSIS — Z139 Encounter for screening, unspecified: Secondary | ICD-10-CM | POA: Diagnosis not present

## 2023-10-03 DIAGNOSIS — R197 Diarrhea, unspecified: Secondary | ICD-10-CM | POA: Diagnosis not present

## 2023-10-03 HISTORY — PX: BIOPSY: SHX5522

## 2023-10-03 HISTORY — PX: COLONOSCOPY WITH PROPOFOL: SHX5780

## 2023-10-03 SURGERY — COLONOSCOPY WITH PROPOFOL
Anesthesia: Monitor Anesthesia Care

## 2023-10-03 MED ORDER — PROPOFOL 500 MG/50ML IV EMUL
INTRAVENOUS | Status: AC
  Filled 2023-10-03: qty 50

## 2023-10-03 MED ORDER — EPHEDRINE SULFATE-NACL 50-0.9 MG/10ML-% IV SOSY
PREFILLED_SYRINGE | INTRAVENOUS | Status: DC | PRN
  Administered 2023-10-03 (×5): 5 mg via INTRAVENOUS

## 2023-10-03 MED ORDER — PROPOFOL 10 MG/ML IV BOLUS
INTRAVENOUS | Status: DC | PRN
  Administered 2023-10-03: 20 mg via INTRAVENOUS
  Administered 2023-10-03: 200 ug/kg/min via INTRAVENOUS
  Administered 2023-10-03: 50 mg via INTRAVENOUS

## 2023-10-03 MED ORDER — LIDOCAINE 2% (20 MG/ML) 5 ML SYRINGE
INTRAMUSCULAR | Status: DC | PRN
  Administered 2023-10-03: 60 mg via INTRAVENOUS

## 2023-10-03 MED ORDER — PHENYLEPHRINE 80 MCG/ML (10ML) SYRINGE FOR IV PUSH (FOR BLOOD PRESSURE SUPPORT)
PREFILLED_SYRINGE | INTRAVENOUS | Status: DC | PRN
  Administered 2023-10-03: 160 ug via INTRAVENOUS
  Administered 2023-10-03: 80 ug via INTRAVENOUS

## 2023-10-03 MED ORDER — SODIUM CHLORIDE 0.9 % IV SOLN: INTRAVENOUS | Status: DC | PRN

## 2023-10-03 SURGICAL SUPPLY — 21 items
ELECT REM PT RETURN 9FT ADLT (ELECTROSURGICAL)
ELECTRODE REM PT RTRN 9FT ADLT (ELECTROSURGICAL) IMPLANT
FCP BXJMBJMB 240X2.8X (CUTTING FORCEPS)
FLOOR PAD 36X40 (MISCELLANEOUS) ×2
FORCEPS BIOP RAD 4 LRG CAP 4 (CUTTING FORCEPS) IMPLANT
FORCEPS BIOP RJ4 240 W/NDL (CUTTING FORCEPS)
FORCEPS BXJMBJMB 240X2.8X (CUTTING FORCEPS) IMPLANT
INJECTOR/SNARE I SNARE (MISCELLANEOUS) IMPLANT
LUBRICANT JELLY 4.5OZ STERILE (MISCELLANEOUS) IMPLANT
MANIFOLD NEPTUNE II (INSTRUMENTS) IMPLANT
NDL SCLEROTHERAPY 25GX240 (NEEDLE) IMPLANT
NEEDLE SCLEROTHERAPY 25GX240 (NEEDLE) IMPLANT
PAD FLOOR 36X40 (MISCELLANEOUS) ×3 IMPLANT
PROBE APC STR FIRE (PROBE) IMPLANT
PROBE INJECTION GOLD 7FR (MISCELLANEOUS) IMPLANT
SNARE ROTATE MED OVAL 20MM (MISCELLANEOUS) IMPLANT
SYR 50ML LL SCALE MARK (SYRINGE) IMPLANT
TRAP SPECIMEN MUCOUS 40CC (MISCELLANEOUS) IMPLANT
TUBING ENDO SMARTCAP PENTAX (MISCELLANEOUS) IMPLANT
TUBING IRRIGATION ENDOGATOR (MISCELLANEOUS) ×3 IMPLANT
WATER STERILE IRR 1000ML POUR (IV SOLUTION) IMPLANT

## 2023-10-03 NOTE — H&P (Signed)
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  He had a minor improvemet with prednisone.  Cholestyramine was tried, but it was not effective.  He was referred to [facility] for a second opinion and he was recommended Peptobismol.  His colonoscopy on 07/23/2019 was positive for three adenomas.  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  There was a recent finding of afib and he was started on Eliquis.  Past Medical History:  Diagnosis Date   Altitude sickness    Asthma    exercise-induced; prn inhaler   AV block, 2nd degree    Bilateral inguinal hernia (BIH) 10/16/2012   Overview:  [facility] -Dr. Luisa Hart.  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, post right TKA 08/10/18   Insomnia 07/14/2014   Mobitz type 1 second degree atrioventricular block    Nasal polyps    OA (osteoarthritis) 09/24/2012   Osteoarthritis of right knee 08/06/2018   Reactive airway disease 09/24/2012   Rotator cuff tear, right    impingement, AC arthrosis    Past Surgical History:  Procedure Laterality Date   A-FLUTTER ABLATION N/A 02/24/2019   Procedure: A-FLUTTER ABLATION;  Surgeon: Regan Lemming, MD;  Location: [facility] INVASIVE CV LAB;  Service: Cardiovascular;  Laterality: N/A;   BACK SURGERY     BIOPSY  07/23/2019   Procedure: BIOPSY;  Surgeon: Jeani Hawking, MD;  Location: [facility]OS[facility];  Service: Endoscopy;;   CARDIAC CATHETERIZATION     CARPAL TUNNEL RELEASE Bilateral 2021   CATARACT EXTRACTION W/ INTRAOCULAR LENS IMPLANT Right    CATARACT EXTRACTION W/ INTRAOCULAR LENS IMPLANT Left    per patient about two years ago 2019   COLONOSCOPY WITH PROPOFOL N/A 07/23/2019   Procedure: COLONOSCOPY WITH PROPOFOL;  Surgeon: Jeani Hawking, MD;  Location: [facility];  Service:  Endoscopy;  Laterality: N/A;   EYE SURGERY     HERNIA REPAIR     INGUINAL HERNIA REPAIR Right 05/09/2020   Procedure: OPEN RIGHT INGUINAL HERNIA REPAIR;  Surgeon: Diamantina Monks, MD;  Location: [facility] OR;  Service: General;  Laterality: Right;   INSERTION OF MESH Right 05/09/2020   Procedure: INSERTION OF MESH;  Surgeon: Diamantina Monks, MD;  Location: [facility] OR;  Service: General;  Laterality: Right;   LEAD REVISION/REPAIR N/A 03/12/2023   Procedure: LEAD REVISION/REPAIR;  Surgeon: Duke Salvia, MD;  Location: [facility] INVASIVE CV LAB;  Service: Cardiovascular;  Laterality: N/A;   LEFT HEART CATH AND CORONARY ANGIOGRAPHY N/A 04/06/2018   Procedure: LEFT HEART CATH AND CORONARY ANGIOGRAPHY;  Surgeon: Corky Crafts, MD;  Location: [facility] INVASIVE CV LAB;  Service: Cardiovascular;  Laterality: N/A;   LUMBAR LAMINECTOMY  age 49s   NASAL SINUS SURGERY     PACEMAKER IMPLANT N/A 02/25/2023   Procedure: PACEMAKER IMPLANT;  Surgeon: Regan Lemming, MD;  Location: [facility] CV [facility];  Service: Cardiovascular;  Laterality: N/A;   POLYPECTOMY  07/23/2019   Procedure: POLYPECTOMY;  Surgeon: Jeani Hawking, MD;  Location: [facility];  Service: Endoscopy;;   REFRACTIVE SURGERY     ROTATOR CUFF REPAIR Right    SHOULDER ARTHROSCOPY  08/06/2011   Procedure: ARTHROSCOPY SHOULDER;  Surgeon: Wyn Forster., MD;  Location: [facility];  Service: Orthopedics;  Laterality: Right;  with subacromial decompression, arthroscopic subscapularis repair, and rotator cuff repair   TOTAL HIP ARTHROPLASTY Right 10/02/2020   Procedure: RIGHT HIP ARTHROPLASTY ANTERIOR APPROACH;  Surgeon: Gean Birchwood, MD;  Location: [facility] ORS;  Service: Orthopedics;  Laterality: Right;  3E BED   TOTAL KNEE ARTHROPLASTY Right 08/10/2018   Procedure: TOTAL KNEE ARTHROPLASTY;  Surgeon: Gean Birchwood, MD;  Location: [facility];  Service: Orthopedics;  Laterality: Right;   TOTAL SHOULDER ARTHROPLASTY Left 05/16/2022   Procedure: TOTAL  SHOULDER ARTHROPLASTY;  Surgeon: Jones Broom, MD;  Location: [facility];  Service: Orthopedics;  Laterality: Left;   TRIGGER FINGER RELEASE Right 06/30/2014   Procedure: RELEASE A-1 PULLEY RIGHT THUMB;  Surgeon: Cindee Salt, MD;  Location: [facility];  Service: Orthopedics;  Laterality: Right;  ANESTHESIA:  [facility]    Family History  Problem Relation Age of Onset   Heart Problems Mother        had pacemaker, lived to 77   Alzheimer's disease Father    Prostate cancer Father     Social History:  reports that he has never smoked. He has never used smokeless tobacco. He reports current alcohol use. He reports that he does not use drugs.  Allergies:  Allergies  Allergen Reactions   Codeine Itching    Medications: Scheduled: Continuous:  No results found for this or any previous visit (from the past 24 hours).   No results found.  ROS:  As stated above in the HPI otherwise negative.  Blood pressure 109/66, pulse 68, temperature (!) 97.3 F (36.3 C), temperature source Temporal, resp. rate 12, height 5\' 8"  (1.727 m), weight 76 kg, SpO2 96%.    PE: Gen: NAD, Alert and Oriented HEENT:  [place]/AT, EOMI Neck: Supple, no LAD Lungs: CTA Bilaterally CV: RRR without M/G/R ABD: Soft, NTND, +BS Ext: No C/C/E  Assessment/Plan: 1) Personal history of polyps - colonoscopy. 2) Diarrhea.  Jream Broyles D 10/03/2023, 10:43 AM

## 2023-10-03 NOTE — Anesthesia Postprocedure Evaluation (Signed)
Anesthesia Post Note  Patient: David [name]  Procedure(s) Performed: COLONOSCOPY WITH PROPOFOL BIOPSY     Patient location during evaluation: PACU Anesthesia Type: MAC Level of consciousness: awake and alert Pain management: pain level controlled Vital Signs Assessment: post-procedure vital signs reviewed and stable Respiratory status: spontaneous breathing, nonlabored ventilation and respiratory function stable Cardiovascular status: blood pressure returned to baseline and stable Postop Assessment: no apparent nausea or vomiting Anesthetic complications: no   No notable events documented.  Last Vitals:  Vitals:   10/03/23 0910 10/03/23 0915  BP: 99/60 109/66  Pulse: 65 68  Resp: 16 12  Temp:    SpO2: 97% 96%    Last Pain:  Vitals:   10/03/23 0915  TempSrc:   PainSc: 0-No pain                 Lannie Fields

## 2023-10-03 NOTE — Discharge Instructions (Signed)
 YOU HAD AN ENDOSCOPIC PROCEDURE TODAY: Refer to the procedure report and other information in the discharge instructions given to you for any specific questions about what was found during the examination. If this information does not answer your questions, please call [facility] at [phone] to clarify.   YOU SHOULD EXPECT: Some feelings of bloating in the abdomen. Passage of more gas than usual. Walking can help get rid of the air that was put into your GI tract during the procedure and reduce the bloating. If you had a lower endoscopy (such as a colonoscopy or flexible sigmoidoscopy) you may notice spotting of blood in your stool or on the toilet paper. Some abdominal soreness may be present for a day or two, also.  DIET: Your first meal following the procedure should be a light meal and then it is ok to progress to your normal diet. A half-sandwich or bowl of soup is an example of a good first meal. Heavy or fried foods are harder to digest and may make you feel nauseous or bloated. Drink plenty of fluids but you should avoid alcoholic beverages for 24 hours. If you had an esophageal dilation, please see attached information for diet.   ACTIVITY: Your care partner should take you home directly after the procedure. You should plan to take it easy, moving slowly for the rest of the day. You can resume normal activity the day after the procedure however YOU SHOULD NOT DRIVE, use power tools, machinery or perform tasks that involve climbing or major physical exertion for 24 hours (because of the sedation medicines used during the test).   SYMPTOMS TO REPORT IMMEDIATELY: A gastroenterologist can be reached at any hour. Please call [phone]  for any of the following symptoms:  Following lower endoscopy (colonoscopy, flexible sigmoidoscopy) Excessive amounts of blood in the stool  Significant tenderness, worsening of abdominal pains  Swelling of the abdomen that is new, acute  Fever of  100 or higher  Following upper endoscopy (EGD, EUS, ERCP, esophageal dilation) Vomiting of blood or coffee ground material  New, significant abdominal pain  New, significant chest pain or pain under the shoulder blades  Painful or persistently difficult swallowing  New shortness of breath  Black, tarry-looking or red, bloody stools  FOLLOW UP:  If any biopsies were taken you will be contacted by phone or by letter within the next 1-3 weeks. Call [phone]  if you have not heard about the biopsies in 3 weeks.  Please also call with any specific questions about appointments or follow up tests.

## 2023-10-03 NOTE — Transfer of Care (Signed)
Immediate Anesthesia Transfer of Care Note  Patient: David [name]  Procedure(s) Performed: COLONOSCOPY WITH PROPOFOL BIOPSY  Patient Location: Endoscopy Unit  Anesthesia Type:MAC  Level of Consciousness: sedated and patient cooperative  Airway & Oxygen Therapy: Patient Spontanous Breathing and Patient connected to face mask oxygen  Post-op Assessment: Report given to RN and Post -op Vital signs reviewed and stable  Post vital signs: Reviewed  Last Vitals:  Vitals Value Taken Time  BP    Temp    Pulse    Resp    SpO2      Last Pain:  Vitals:   10/03/23 0738  TempSrc: Tympanic  PainSc: 0-No pain         Complications: No notable events documented.

## 2023-10-03 NOTE — Op Note (Signed)
[facility] Patient Name: David [name] Procedure Date: 10/03/2023 MRN: [id number] Attending MD: Jeani Hawking , MD, [id number] Date of Birth: 1946/04/20 CSN: [id number] Age: 78 Admit Type: Outpatient Procedure:                Colonoscopy Indications:              High risk colon cancer surveillance: Personal                            history of colonic polyps Providers:                Jeani Hawking, MD, Fransisca Connors, Salley Scarlet, Technician, Romie Minus, CRNA Referring MD:              Medicines:                Propofol per Anesthesia Complications:            No immediate complications. Estimated Blood Loss:     Estimated blood loss: none. Procedure:                Pre-Anesthesia Assessment:                           - Prior to the procedure, a History and Physical                            was performed, and patient medications and                            allergies were reviewed. The patient's tolerance of                            previous anesthesia was also reviewed. The risks                            and benefits of the procedure and the sedation                            options and risks were discussed with the patient.                            All questions were answered, and informed consent                            was obtained. Prior Anticoagulants: The patient has                            taken no anticoagulant or antiplatelet agents. ASA                            Grade Assessment: III - A patient with severe  systemic disease. After reviewing the risks and                            benefits, the patient was deemed in satisfactory                            condition to undergo the procedure.                           - Sedation was administered by an anesthesia                            professional. Deep sedation was attained.                           After obtaining informed  consent, the colonoscope                            was passed under direct vision. Throughout the                            procedure, the patient's blood pressure, pulse, and                            oxygen saturations were monitored continuously. The                            CF-HQ190L ([id number]) Olympus colonoscope was                            introduced through the anus and advanced to the the                            cecum, identified by appendiceal orifice and                            ileocecal valve. The colonoscopy was performed                            without difficulty. The patient tolerated the                            procedure well. The quality of the bowel                            preparation was evaluated using the BBPS [facility]                            Bowel Preparation Scale) with scores of: Right                            Colon = 3 (entire mucosa seen well with no residual  staining, small fragments of stool or opaque                            liquid) and Left Colon = 3 (entire mucosa seen well                            with no residual staining, small fragments of stool                            or opaque liquid). The total BBPS score equals 6.                            The quality of the bowel preparation was good. The                            ileocecal valve, appendiceal orifice, and rectum                            were photographed. Scope In: 8:32:12 AM Scope Out: 8:45:11 AM Scope Withdrawal Time: 0 hours 9 minutes 0 seconds  Total Procedure Duration: 0 hours 12 minutes 59 seconds  Findings:      Normal mucosa was found in the entire colon. Biopsies for histology were       taken with a cold forceps from the entire colon for evaluation of       microscopic colitis.      A few small-mouthed diverticula were found in the sigmoid colon. Impression:               - Normal mucosa in the entire examined colon.                             Biopsied.                           - Diverticulosis in the sigmoid colon. Moderate Sedation:      Not Applicable - Patient had care per Anesthesia. Recommendation:           - Patient has a contact number available for                            emergencies. The signs and symptoms of potential                            delayed complications were discussed with the                            patient. Return to normal activities tomorrow.                            Written discharge instructions were provided to the                            patient.                           -  Resume previous diet.                           - Continue present medications.                           - Await pathology results.                           - Repeat colonoscopy is not recommended for                            surveillance. Procedure Code(s):        --- Professional ---                           [phone], Colonoscopy, flexible; with biopsy, single                            or multiple Diagnosis Code(s):        --- Professional ---                           Z86.010, Personal history of colonic polyps                           K57.30, Diverticulosis of large intestine without                            perforation or abscess without bleeding CPT copyright 2022 [employer] Association. All rights reserved. The codes documented in this report are preliminary and upon coder review may  be revised to meet current compliance requirements. Jeani Hawking, MD Jeani Hawking, MD 10/03/2023 8:49:41 AM This report has been signed electronically. Number of Addenda: 0

## 2023-10-06 LAB — SURGICAL PATHOLOGY

## 2023-10-06 NOTE — Progress Notes (Signed)
 Remote pacemaker transmission.

## 2023-10-07 NOTE — Progress Notes (Signed)
 This encounter was created in error - please disregard.

## 2023-10-16 ENCOUNTER — Other Ambulatory Visit: Payer: Self-pay | Admitting: Student

## 2023-10-16 NOTE — Telephone Encounter (Signed)
Pt last saw Dr Elberta Fortis 07/22/23, last labs 06/02/23 Creat 0.95, age 78, weight 76kg, based on specified criteria pt is on appropriate dosage of Eliquis 5mg  BID for afib/aflutter.  Will refill rx.

## 2023-11-03 ENCOUNTER — Other Ambulatory Visit: Payer: Self-pay

## 2023-11-03 ENCOUNTER — Other Ambulatory Visit (HOSPITAL_COMMUNITY): Payer: Self-pay

## 2023-11-03 NOTE — Progress Notes (Signed)
 Specialty Pharmacy Refill Coordination Note  David [name] is a 78 y.o. male contacted today regarding refills of specialty medication(s) No data recorded  Patient requested (Patient-Rptd) Delivery   Delivery date: (Patient-Rptd) 11/06/23   Verified address: (Patient-Rptd) [address]   Medication will be filled on 11/05/23.

## 2023-11-05 ENCOUNTER — Other Ambulatory Visit: Payer: Self-pay

## 2023-11-24 DIAGNOSIS — R197 Diarrhea, unspecified: Secondary | ICD-10-CM | POA: Diagnosis not present

## 2023-11-24 DIAGNOSIS — R151 Fecal smearing: Secondary | ICD-10-CM | POA: Diagnosis not present

## 2023-11-27 ENCOUNTER — Ambulatory Visit (INDEPENDENT_AMBULATORY_CARE_PROVIDER_SITE_OTHER): Payer: Medicare Other

## 2023-11-27 DIAGNOSIS — I442 Atrioventricular block, complete: Secondary | ICD-10-CM | POA: Diagnosis not present

## 2023-11-27 LAB — CUP PACEART REMOTE DEVICE CHECK
Battery Remaining Longevity: 128 mo
Battery Voltage: 3.06 V
Brady Statistic AP VP Percent: 14.33 %
Brady Statistic AP VS Percent: 0 %
Brady Statistic AS VP Percent: 85.44 %
Brady Statistic AS VS Percent: 0.22 %
Brady Statistic RA Percent Paced: 14.03 %
Brady Statistic RV Percent Paced: 99.48 %
Date Time Interrogation Session: 20250326233504
Implantable Lead Connection Status: 753985
Implantable Lead Connection Status: 753985
Implantable Lead Implant Date: 20240625
Implantable Lead Implant Date: 20240710
Implantable Lead Location: 753859
Implantable Lead Location: 753860
Implantable Lead Model: 3830
Implantable Lead Model: 5076
Implantable Pulse Generator Implant Date: 20240625
Lead Channel Impedance Value: 304 Ohm
Lead Channel Impedance Value: 323 Ohm
Lead Channel Impedance Value: 437 Ohm
Lead Channel Impedance Value: 494 Ohm
Lead Channel Pacing Threshold Amplitude: 0.625 V
Lead Channel Pacing Threshold Amplitude: 0.75 V
Lead Channel Pacing Threshold Pulse Width: 0.4 ms
Lead Channel Pacing Threshold Pulse Width: 0.4 ms
Lead Channel Sensing Intrinsic Amplitude: 12.625 mV
Lead Channel Sensing Intrinsic Amplitude: 12.625 mV
Lead Channel Sensing Intrinsic Amplitude: 2.375 mV
Lead Channel Sensing Intrinsic Amplitude: 2.375 mV
Lead Channel Setting Pacing Amplitude: 1.5 V
Lead Channel Setting Pacing Amplitude: 2.5 V
Lead Channel Setting Pacing Pulse Width: 0.4 ms
Lead Channel Setting Sensing Sensitivity: 0.9 mV
Zone Setting Status: 755011

## 2023-12-23 ENCOUNTER — Other Ambulatory Visit (HOSPITAL_BASED_OUTPATIENT_CLINIC_OR_DEPARTMENT_OTHER): Payer: Self-pay | Admitting: Pulmonary Disease

## 2023-12-23 ENCOUNTER — Encounter (HOSPITAL_BASED_OUTPATIENT_CLINIC_OR_DEPARTMENT_OTHER): Payer: Self-pay | Admitting: Pulmonary Disease

## 2023-12-23 ENCOUNTER — Ambulatory Visit (HOSPITAL_BASED_OUTPATIENT_CLINIC_OR_DEPARTMENT_OTHER): Payer: Medicare Other | Admitting: Pulmonary Disease

## 2023-12-23 VITALS — BP 126/74 | HR 74 | Ht 68.0 in | Wt 164.0 lb

## 2023-12-23 DIAGNOSIS — J4550 Severe persistent asthma, uncomplicated: Secondary | ICD-10-CM

## 2023-12-23 MED ORDER — ACETAZOLAMIDE 125 MG PO TABS: ORAL_TABLET | ORAL | 0 refills | Status: AC

## 2023-12-23 MED ORDER — PREDNISONE 10 MG PO TABS
ORAL_TABLET | ORAL | 0 refills | Status: AC
Start: 2023-12-23 — End: 2023-12-31

## 2023-12-23 NOTE — Patient Instructions (Addendum)
 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 Continue as scheduled --CONTINUE Advair 230-21 mcg 1-2 puffs TWICE a day --CONTINUE Albuterol  AS NEEDED for shortness of breath  Altitude sickness prevention --Recommend gradual ascent, hydration for primary prevention --Avoid alcohol if able  Prophylaxis: Acetazolamide : 125 mg orally every 12 hours Duration: Start day before ascent and continue 2-3 days at maximum altitude; may use once at night thereafter to improve sleep Treatment: If you develop altitude sickness, take two tabs twice a day for 24 hours or until descent <6623ft

## 2023-12-23 NOTE — Progress Notes (Signed)
 Synopsis: Referred in January 2021 for Asthma. Former patient of Dr. Leigh [name]. David [name] Subjective:   PATIENT ID: David [name] GENDER: male DOB: Oct 30, 1945, MRN: [id number]   HPI  Chief Complaint  Patient presents with   Follow-up    Asthma   David [name] is a 78 year old male never smoker with severe persistent asthma who presents for follow-up.  Synopsis: He started omalizumab  11/29/19 with significant improvement in his symptoms and has stop using his inhaler on a daily basis. He is using Trelegy ellipta  as needed, maybe a couple times per month.  He was diagnosed with asthma 20 years ago. He was has been on Advair, Symbicort  and Trelegy in the past. He was started on Xolair  in 2021 however he feels recently it has been less effective. Did not tolerate Symbicort  due to cough during inhalation. Trelegy has been starting become less effective as well. He currently has coughing, wheezing and shortness of breath and awakening at night. He is currently on prednisone  and symptoms will recur when he is off steroids. He has required steroid tapers six times in the last year.  01/15/22 Since our last visit inhalers were changed to medium dose Advair HFA. We discussed transitioning to Dupixent  on telephone encounters however he has had improved symptoms on Xolair  so decided to continue this biologic. He reports he is overall doing well on Xolair  and Advair. Not needing albuterol  inhaler. Denies shortness of breath, wheezing or coughing. No nocturnal symptoms.  04/29/22 He is planning for left total shoulder arthroplasty on 05/16/2022. Last exacerbation in 10/2021. Currently on Advair ONE puff TWICE a day due to a cough/sore throat. Reports symptoms are overall well controlled. No nocturnal symptoms. On Xolair . Does report mild cough that usually leads to worsening respiratory symptoms if not addressed.  10/30/22 Since our last visit he was seen in [facility] ED for chest tightness and sore throat. He  was treated with duonebs and prednisone  60 mg. Had hypoxemia to upper 80s. C/b atrial flutter. Was started on anticoagulation by ED but this was determined not to continue by his cardiologist. He had paused Advair use for several months. In December he had a URI.   04/29/23 He reports recent covid infection exposure two days ago at a birthday. Denies any current respiratory symptoms related to this. This summer he has been treated for asthma exacerbation in June. 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 Before his cardiac issues,s he reports his asthma symptoms are have worsened with shortness of breath, chest tightness, cough, nightly awakenings and cough and seem to flare up every 1-2 months. He is intermittently taking inhalers mainly due to it not being as effective as before. He reports coughing with his inhaler and is frustrated.  06/16/23 Since our last visit he was seen the ED on 06/02/23 for atrial fibrillation. He has been having issues with his pacemaker and noted to be in atrial flutter. Started on eliquis  by Cardiology. He was noted to have hypoxemia to 85-88% with exertion but resolved/normalized with rest. In the ED CTA neg for PE. He was treated with IV decadron  followed by prednisone   09/17/23 Since our last visit he has been on Dupixent . He has been compliant with bronchodilators and has had significantly improved symptoms while on biologic. Denies shortness of breath, cough or wheezing since October 2024. Denies nocturnal symptoms. No exacerbations since our last visit. No recent use of rescue inhalers. Had mild flat rash on his legs not any  worse.  12/23/23 Since our last visit he is overall doing well. He reports using a prednisone  back in Feb 2025. Denies shortness of breath, cough or wheezing. Except for pollen irritating his symptoms.   Asthma Control Test ACT Total Score  12/23/2023  1:54 PM 25  09/17/2023  9:57 AM 24  04/29/2023  2:08 PM 10    Past Medical  History:  Diagnosis Date   Altitude sickness    Asthma    exercise-induced; prn inhaler   AV block, 2nd degree    Bilateral inguinal hernia (BIH) 10/16/2012   Overview:  [name] -Dr. Afton [name].  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, post right TKA 08/10/18   Insomnia 07/14/2014   Mobitz type 1 second degree atrioventricular block    Nasal polyps    OA (osteoarthritis) 09/24/2012   Osteoarthritis of right knee 08/06/2018   Reactive airway disease 09/24/2012   Rotator cuff tear, right    impingement, AC arthrosis     Allergies  Allergen Reactions   Codeine Itching     Outpatient Medications Prior to Visit  Medication Sig Dispense Refill   ADVAIR HFA 230-21 MCG/ACT inhaler INHALE 2 PUFFS INTO THE LUNGS TWICE DAILY 12 g 5   albuterol  (VENTOLIN  HFA) 108 (90 Base) MCG/ACT inhaler Inhale 2 puffs into the lungs every 4 (four) hours as needed for wheezing or shortness of breath. 8 g 11   apixaban  (ELIQUIS ) 5 MG TABS tablet TAKE 1 TABLET(5 MG) BY MOUTH TWICE DAILY 180 tablet 2   Dupilumab  (DUPIXENT ) 300 MG/2ML SOAJ Inject 300 mg into the skin every 14 (fourteen) days. 8 mL 3   fluticasone  (FLONASE ) 50 MCG/ACT nasal spray Place 1 spray into both nostrils daily. 18.2 mL 2   No facility-administered medications prior to visit.    Review of Systems  Constitutional:  Negative for chills, diaphoresis, fever, malaise/fatigue and weight loss.  HENT:  Negative for congestion.   Respiratory:  Negative for cough, hemoptysis, sputum production, shortness of breath and wheezing.   Cardiovascular:  Negative for chest pain, palpitations and leg swelling.    Objective:   Vitals:   12/23/23 1352  BP: 126/74  Pulse: 74  SpO2: 96%  Weight: 164 lb (74.4 kg)  Height: 5\' 8"  (1.727 m)   Physical Exam: General: Well-appearing, no acute distress HENT: [place], AT Eyes:  EOMI, no scleral icterus Respiratory: Clear to auscultation bilaterally.  No crackles, wheezing or rales Cardiovascular: RRR, -M/R/G, no JVD Extremities:-Edema,-tenderness Neuro: AAO x4, CNII-XII grossly intact Psych: Normal mood, normal affect  Data Reviewed  Chest imaging: CTA 04/05/18 - No PE. Minimal bibasilar atelectasis. CXR 09/11/20 - lingular scar. Cardiomegaly. CXR 10/24/21 - Linular scar. Unchanged cardiomegaly CXR 03/12/23 - Left basilar scarring. Cardiomegaly with dual lead AICD CTA 06/02/23 - No PE. Left basilar scarring  PFT  10/18/19 FVC 3.73 (93%) FEV1 2.46 (84%) Ratio 62 TLC 104% DLCO 111% Interpretation: On bronchodilators (Spiriva ). Mild obstructive defect. No significant bronchodilator response however does not preclude benefit of therapy.  Labs IgE  09/29/19 -137 03/22/20 - 297 10/18/21 - 602 02/08/23 - 900 06/02/23 - 900  Absolute eos 02/08/23 - 900    Assessment & Plan:   Discussion: 78 year old male never smoker with severe persistent asthma on Dupixent  who presents for follow-up. Well controlled on current regimen. Previously failed Xolair  with persistent symptoms and  exacerbations including  in Feb, Aug and December 2023 and June and September in 2024.   Previously failed Symbicort  (cough) and Trelegy (ineffective).   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 Continue as scheduled --CONTINUE Advair 230-21 mcg 1-2 puffs TWICE a day --CONTINUE Albuterol  AS NEEDED for shortness of breath  Altitude sickness prevention --Recommend gradual ascent, hydration for primary prevention --Avoid/minimize alcohol   Prophylaxis: Acetazolamide : 125 mg orally every 12 hours Duration: Start day before ascent and continue 2-3 days at maximum altitude; may use once at night thereafter to improve sleep Treatment: If you develop altitude sickness, take two tabs twice a day for 24 hours or until descent  <[id number]  Return in about 6 months (around 06/23/2024).   I have spent a total time of 35-minutes on the day of the appointment including chart review, data review, collecting history, coordinating care and discussing medical diagnosis and plan with the patient/family. Past medical history, allergies, medications were reviewed. Pertinent imaging, labs and tests included in this note have been reviewed and interpreted independently by me.  Genetta Kenning, MD [facility]   Current Outpatient Medications:    ADVAIR HFA 230-21 MCG/ACT inhaler, INHALE 2 PUFFS INTO THE LUNGS TWICE DAILY, Disp: 12 g, Rfl: 5   albuterol  (VENTOLIN  HFA) 108 (90 Base) MCG/ACT inhaler, Inhale 2 puffs into the lungs every 4 (four) hours as needed for wheezing or shortness of breath., Disp: 8 g, Rfl: 11   apixaban  (ELIQUIS ) 5 MG TABS tablet, TAKE 1 TABLET(5 MG) BY MOUTH TWICE DAILY, Disp: 180 tablet, Rfl: 2   Dupilumab  (DUPIXENT ) 300 MG/2ML SOAJ, Inject 300 mg into the skin every 14 (fourteen) days., Disp: 8 mL, Rfl: 3   fluticasone  (FLONASE ) 50 MCG/ACT nasal spray, Place 1 spray into both nostrils daily., Disp: 18.2 mL, Rfl: 2

## 2023-12-31 ENCOUNTER — Other Ambulatory Visit: Payer: Self-pay

## 2023-12-31 ENCOUNTER — Other Ambulatory Visit (HOSPITAL_COMMUNITY): Payer: Self-pay

## 2023-12-31 NOTE — Progress Notes (Signed)
 Specialty Pharmacy Refill Coordination Note  David [name] is a 78 y.o. male contacted today regarding refills of specialty medication(s) Dupilumab  (Dupixent )   Patient requested (Patient-Rptd) Delivery   Delivery date: (Patient-Rptd) 01/02/24   Verified address: (Patient-Rptd) [address]   Medication will be filled on 05.01.25.

## 2024-01-01 ENCOUNTER — Other Ambulatory Visit: Payer: Self-pay

## 2024-01-07 NOTE — Progress Notes (Signed)
 Remote pacemaker transmission.

## 2024-01-13 DIAGNOSIS — J4540 Moderate persistent asthma, uncomplicated: Secondary | ICD-10-CM | POA: Diagnosis not present

## 2024-01-13 DIAGNOSIS — J302 Other seasonal allergic rhinitis: Secondary | ICD-10-CM | POA: Diagnosis not present

## 2024-01-13 DIAGNOSIS — R197 Diarrhea, unspecified: Secondary | ICD-10-CM | POA: Diagnosis not present

## 2024-01-13 DIAGNOSIS — T781XXA Other adverse food reactions, not elsewhere classified, initial encounter: Secondary | ICD-10-CM | POA: Diagnosis not present

## 2024-01-22 DIAGNOSIS — R197 Diarrhea, unspecified: Secondary | ICD-10-CM | POA: Diagnosis not present

## 2024-01-22 DIAGNOSIS — R350 Frequency of micturition: Secondary | ICD-10-CM | POA: Diagnosis not present

## 2024-01-22 DIAGNOSIS — Z79899 Other long term (current) drug therapy: Secondary | ICD-10-CM | POA: Diagnosis not present

## 2024-01-22 DIAGNOSIS — I442 Atrioventricular block, complete: Secondary | ICD-10-CM | POA: Diagnosis not present

## 2024-01-22 DIAGNOSIS — I483 Typical atrial flutter: Secondary | ICD-10-CM | POA: Diagnosis not present

## 2024-01-22 DIAGNOSIS — G4700 Insomnia, unspecified: Secondary | ICD-10-CM | POA: Diagnosis not present

## 2024-01-22 DIAGNOSIS — K649 Unspecified hemorrhoids: Secondary | ICD-10-CM | POA: Diagnosis not present

## 2024-01-22 DIAGNOSIS — E559 Vitamin D deficiency, unspecified: Secondary | ICD-10-CM | POA: Diagnosis not present

## 2024-01-22 DIAGNOSIS — J4550 Severe persistent asthma, uncomplicated: Secondary | ICD-10-CM | POA: Diagnosis not present

## 2024-01-22 DIAGNOSIS — E538 Deficiency of other specified B group vitamins: Secondary | ICD-10-CM | POA: Diagnosis not present

## 2024-01-22 DIAGNOSIS — Z1330 Encounter for screening examination for mental health and behavioral disorders, unspecified: Secondary | ICD-10-CM | POA: Diagnosis not present

## 2024-02-10 DIAGNOSIS — K643 Fourth degree hemorrhoids: Secondary | ICD-10-CM | POA: Diagnosis not present

## 2024-02-11 ENCOUNTER — Telehealth: Payer: Self-pay

## 2024-02-11 NOTE — Telephone Encounter (Signed)
...     Pre-operative Risk Assessment    Patient Name: David [name]  DOB: 25-Oct-1945 MRN: [id number]   Date of last office visit: 07/22/23 CAMNITZ/ PHONE VISIT 09/29/23 SCOTT Date of next office visit: NONE   Request for Surgical Clearance    Procedure:  ANORECTAL EXAM, EXCISIONAL HEMORRHOIDECTOMY  Date of Surgery:  Clearance 03/03/24                                Surgeon:  DR Jinny Mounts Surgeon's Group or Practice Name:  [facility] Phone number:  [phone] Fax number:  [phone]   Type of Clearance Requested:   - Medical  - Pharmacy:  Hold Apixaban  (Eliquis )      Type of Anesthesia:  ANESTHESIA WILL BE USE BUT TYPE NOT STATED   Additional requests/questions:    Montel Antu   02/11/2024, 11:32 AM

## 2024-02-11 NOTE — Telephone Encounter (Signed)
 Left message to call back to schedule a tele pre op appt.  ?

## 2024-02-12 NOTE — Telephone Encounter (Signed)
 Patient with diagnosis of afib on Eliquis  for anticoagulation.    Procedure: ANORECTAL EXAM, EXCISIONAL HEMORRHOIDECTOMY   Date of procedure: 03/03/24    CHA2DS2-VASc Score = 2   This indicates a 2.2% annual risk of stroke. The patient's score is based upon: CHF History: 0 HTN History: 0 Diabetes History: 0 Stroke History: 0 Vascular Disease History: 0 Age Score: 2 Gender Score: 0    CrCl 56 mL/min Platelet count 215  Patient has not had an Afib/aflutter ablation within the last 3 months or DCCV within the last 30 days   Per office protocol, patient can hold Eliquis  for 2  days prior to procedure.     **This guidance is not considered finalized until pre-operative APP has relayed final recommendations.**

## 2024-02-16 ENCOUNTER — Encounter: Payer: Self-pay | Admitting: Cardiology

## 2024-02-16 NOTE — Progress Notes (Signed)
 PERIOPERATIVE PRESCRIPTION FOR IMPLANTED CARDIAC DEVICE PROGRAMMING   Patient Information:  Patient: David [name]  MRN: [id number]  Date of Birth: 24-Jun-1946   Procedure:  ANORECTAL EXAM, EXCISIONAL HEMORRHOIDECTOMY   Date of Surgery:  Clearance 03/03/24                                  Surgeon:  DR Jinny Mounts Surgeon's Group or Practice Name:  [facility] Phone number:  [phone] Fax number:  [phone]    Device Information:   Clinic EP Physician:   Dr. Agatha Horsfall Device Type:  Pacemaker Manufacturer and Phone #:  Medtronic: [id number] Pacemaker Dependent?:  Yes Date of Last Device Check:  11/26/2023         Normal Device Function?:  Yes     Electrophysiologist's Recommendations:   Have magnet available. Provide continuous ECG monitoring when magnet is used or reprogramming is to be performed.  Procedure should not interfere with device function.  No device programming or magnet placement needed.  Per Device Clinic Standing Orders, Glorianne Largo  02/16/2024 3:07 PM

## 2024-02-16 NOTE — Telephone Encounter (Addendum)
 2nd attempt to reach the pt to set up tele preop appt. I will update the requesting office that we have tried to reach the pt x 2 to schedule a tele preop appt. If requesting office s/w the pt to please ask him to call [phone] preop team to schedule a tele preop appt.

## 2024-02-17 NOTE — Telephone Encounter (Signed)
 3rd attempt to reach pt to schedule a tele preop appt. I will update the requesting office the pt needs to call the office to schedule a tele preop appt.

## 2024-02-26 ENCOUNTER — Ambulatory Visit: Payer: Medicare Other

## 2024-02-26 DIAGNOSIS — I442 Atrioventricular block, complete: Secondary | ICD-10-CM

## 2024-02-26 LAB — CUP PACEART REMOTE DEVICE CHECK
Battery Remaining Longevity: 125 mo
Battery Voltage: 3.03 V
Brady Statistic AP VP Percent: 25.57 %
Brady Statistic AP VS Percent: 0 %
Brady Statistic AS VP Percent: 73.85 %
Brady Statistic AS VS Percent: 0.57 %
Brady Statistic RA Percent Paced: 25.84 %
Brady Statistic RV Percent Paced: 99.42 %
Date Time Interrogation Session: 20250625214445
Implantable Lead Connection Status: 753985
Implantable Lead Connection Status: 753985
Implantable Lead Implant Date: 20240625
Implantable Lead Implant Date: 20240710
Implantable Lead Location: 753859
Implantable Lead Location: 753860
Implantable Lead Model: 3830
Implantable Lead Model: 5076
Implantable Pulse Generator Implant Date: 20240625
Lead Channel Impedance Value: 285 Ohm
Lead Channel Impedance Value: 304 Ohm
Lead Channel Impedance Value: 437 Ohm
Lead Channel Impedance Value: 475 Ohm
Lead Channel Pacing Threshold Amplitude: 0.75 V
Lead Channel Pacing Threshold Amplitude: 0.875 V
Lead Channel Pacing Threshold Pulse Width: 0.4 ms
Lead Channel Pacing Threshold Pulse Width: 0.4 ms
Lead Channel Sensing Intrinsic Amplitude: 13.75 mV
Lead Channel Sensing Intrinsic Amplitude: 13.75 mV
Lead Channel Sensing Intrinsic Amplitude: 2.125 mV
Lead Channel Sensing Intrinsic Amplitude: 2.125 mV
Lead Channel Setting Pacing Amplitude: 1.5 V
Lead Channel Setting Pacing Amplitude: 2.5 V
Lead Channel Setting Pacing Pulse Width: 0.4 ms
Lead Channel Setting Sensing Sensitivity: 0.9 mV
Zone Setting Status: 755011

## 2024-02-27 ENCOUNTER — Other Ambulatory Visit: Payer: Self-pay

## 2024-02-27 ENCOUNTER — Encounter (INDEPENDENT_AMBULATORY_CARE_PROVIDER_SITE_OTHER): Payer: Self-pay

## 2024-02-27 NOTE — Progress Notes (Signed)
 Specialty Pharmacy Refill Coordination Note  David [name] is a 78 y.o. male contacted today regarding refills of specialty medication(s) Dupilumab  (Dupixent )   Patient requested (Patient-Rptd) Delivery   Delivery date: 03/02/24   Verified address: (Patient-Rptd) [address]   Medication will be filled on 06.30.25.

## 2024-02-29 ENCOUNTER — Ambulatory Visit: Payer: Self-pay | Admitting: Cardiology

## 2024-03-03 DIAGNOSIS — Z7901 Long term (current) use of anticoagulants: Secondary | ICD-10-CM | POA: Diagnosis not present

## 2024-03-03 DIAGNOSIS — K643 Fourth degree hemorrhoids: Secondary | ICD-10-CM | POA: Diagnosis not present

## 2024-03-03 DIAGNOSIS — K648 Other hemorrhoids: Secondary | ICD-10-CM | POA: Diagnosis not present

## 2024-03-03 DIAGNOSIS — I4891 Unspecified atrial fibrillation: Secondary | ICD-10-CM | POA: Diagnosis not present

## 2024-03-03 DIAGNOSIS — J45909 Unspecified asthma, uncomplicated: Secondary | ICD-10-CM | POA: Diagnosis not present

## 2024-03-03 DIAGNOSIS — K6389 Other specified diseases of intestine: Secondary | ICD-10-CM | POA: Diagnosis not present

## 2024-03-03 DIAGNOSIS — Z950 Presence of cardiac pacemaker: Secondary | ICD-10-CM | POA: Diagnosis not present

## 2024-03-04 DIAGNOSIS — K649 Unspecified hemorrhoids: Secondary | ICD-10-CM | POA: Diagnosis not present

## 2024-03-09 ENCOUNTER — Other Ambulatory Visit (HOSPITAL_COMMUNITY): Payer: Self-pay

## 2024-05-03 ENCOUNTER — Other Ambulatory Visit: Payer: Self-pay | Admitting: Pulmonary Disease

## 2024-05-03 DIAGNOSIS — J4550 Severe persistent asthma, uncomplicated: Secondary | ICD-10-CM

## 2024-05-04 ENCOUNTER — Other Ambulatory Visit: Payer: Self-pay | Admitting: Pulmonary Disease

## 2024-05-04 ENCOUNTER — Other Ambulatory Visit (HOSPITAL_COMMUNITY): Payer: Self-pay

## 2024-05-04 DIAGNOSIS — J4550 Severe persistent asthma, uncomplicated: Secondary | ICD-10-CM

## 2024-05-04 NOTE — Telephone Encounter (Signed)
 Refill Dupixent 

## 2024-05-05 ENCOUNTER — Other Ambulatory Visit: Payer: Self-pay

## 2024-05-05 MED ORDER — DUPIXENT 300 MG/2ML ~~LOC~~ SOAJ
300.0000 mg | SUBCUTANEOUS | 1 refills | Status: AC
  Filled 2024-05-05: qty 12, 84d supply, fill #0
  Filled 2024-05-06: qty 8, 56d supply, fill #0
  Filled 2024-06-18: qty 8, 56d supply, fill #1
  Filled 2024-08-23: qty 8, 56d supply, fill #2

## 2024-05-05 NOTE — Telephone Encounter (Signed)
 Refill sent for DUPIXENT  to [facility]: [phone]   Dose: 300mg  every 14 days  Last OV: 12/23/23 Provider: Dr. Kassie  Next OV: due around 06/23/24, not yet scheduled  Routing to scheduling team for follow-up on appt scheduling.   Aleck Puls, PharmD, BCPS Clinical Pharmacist  [facility]

## 2024-05-06 ENCOUNTER — Other Ambulatory Visit: Payer: Self-pay

## 2024-05-06 ENCOUNTER — Other Ambulatory Visit (HOSPITAL_COMMUNITY): Payer: Self-pay

## 2024-05-06 DIAGNOSIS — R490 Dysphonia: Secondary | ICD-10-CM | POA: Diagnosis not present

## 2024-05-06 NOTE — Progress Notes (Signed)
 Specialty Pharmacy Refill Coordination Note  David [name] is a 78 y.o. male contacted today regarding refills of specialty medication(s) Dupilumab  (Dupixent )   Patient requested Delivery   Delivery date: 05/11/24   Verified address: [address], [place]   Medication will be filled on 05/10/24.

## 2024-05-07 ENCOUNTER — Other Ambulatory Visit: Payer: Self-pay

## 2024-05-07 ENCOUNTER — Other Ambulatory Visit (HOSPITAL_COMMUNITY): Payer: Self-pay

## 2024-05-09 DIAGNOSIS — S83242A Other tear of medial meniscus, current injury, left knee, initial encounter: Secondary | ICD-10-CM | POA: Diagnosis not present

## 2024-05-18 ENCOUNTER — Other Ambulatory Visit (HOSPITAL_COMMUNITY): Payer: Self-pay | Admitting: Orthopedic Surgery

## 2024-05-18 DIAGNOSIS — M25562 Pain in left knee: Secondary | ICD-10-CM

## 2024-05-27 ENCOUNTER — Ambulatory Visit (INDEPENDENT_AMBULATORY_CARE_PROVIDER_SITE_OTHER): Payer: Medicare Other

## 2024-05-27 DIAGNOSIS — I442 Atrioventricular block, complete: Secondary | ICD-10-CM

## 2024-05-27 LAB — CUP PACEART REMOTE DEVICE CHECK
Battery Remaining Longevity: 122 mo
Battery Voltage: 3.02 V
Brady Statistic AP VP Percent: 29.42 %
Brady Statistic AP VS Percent: 0.02 %
Brady Statistic AS VP Percent: 70.41 %
Brady Statistic AS VS Percent: 0.15 %
Brady Statistic RA Percent Paced: 28.64 %
Brady Statistic RV Percent Paced: 99.64 %
Date Time Interrogation Session: 20250925020912
Implantable Lead Connection Status: 753985
Implantable Lead Connection Status: 753985
Implantable Lead Implant Date: 20240625
Implantable Lead Implant Date: 20240710
Implantable Lead Location: 753859
Implantable Lead Location: 753860
Implantable Lead Model: 3830
Implantable Lead Model: 5076
Implantable Pulse Generator Implant Date: 20240625
Lead Channel Impedance Value: 304 Ohm
Lead Channel Impedance Value: 323 Ohm
Lead Channel Impedance Value: 437 Ohm
Lead Channel Impedance Value: 437 Ohm
Lead Channel Pacing Threshold Amplitude: 0.625 V
Lead Channel Pacing Threshold Amplitude: 1 V
Lead Channel Pacing Threshold Pulse Width: 0.4 ms
Lead Channel Pacing Threshold Pulse Width: 0.4 ms
Lead Channel Sensing Intrinsic Amplitude: 12.125 mV
Lead Channel Sensing Intrinsic Amplitude: 12.125 mV
Lead Channel Sensing Intrinsic Amplitude: 2.625 mV
Lead Channel Sensing Intrinsic Amplitude: 2.625 mV
Lead Channel Setting Pacing Amplitude: 1.5 V
Lead Channel Setting Pacing Amplitude: 2.5 V
Lead Channel Setting Pacing Pulse Width: 0.4 ms
Lead Channel Setting Sensing Sensitivity: 0.9 mV
Zone Setting Status: 755011

## 2024-05-28 ENCOUNTER — Ambulatory Visit: Payer: Self-pay | Admitting: Cardiology

## 2024-05-28 NOTE — Progress Notes (Signed)
 Remote PPM Transmission

## 2024-05-31 NOTE — Progress Notes (Signed)
 Remote PPM Transmission

## 2024-06-11 NOTE — CV Procedure (Addendum)
  Device system confirmed to be MRI conditional, with implant date > 6 weeks ago, and no evidence of abandoned or epicardial leads in review of most recent CXR  Device last cleared by EP Provider: Daphne Barrack NP  06/10/2024  Clearance is good through for 1 year as long as parameters remain stable at time of check. If pt undergoes a cardiac device procedure during that time, they should be re-cleared.   Tachy-therapies to be programmed off if applicable with device back to pre-MRI settings after completion of exam.  [employer] - Programming recommendation received through [employer] App/Tablet  Suzan Recardo Arna Debby  06/11/2024 1:07 PM

## 2024-06-14 ENCOUNTER — Encounter (HOSPITAL_BASED_OUTPATIENT_CLINIC_OR_DEPARTMENT_OTHER): Payer: Self-pay | Admitting: Pulmonary Disease

## 2024-06-14 ENCOUNTER — Ambulatory Visit (INDEPENDENT_AMBULATORY_CARE_PROVIDER_SITE_OTHER): Admitting: Pulmonary Disease

## 2024-06-14 VITALS — BP 145/95 | HR 66 | Ht 68.0 in | Wt 164.0 lb

## 2024-06-14 DIAGNOSIS — J4550 Severe persistent asthma, uncomplicated: Secondary | ICD-10-CM

## 2024-06-14 DIAGNOSIS — J8283 Eosinophilic asthma: Secondary | ICD-10-CM | POA: Diagnosis not present

## 2024-06-14 DIAGNOSIS — J4530 Mild persistent asthma, uncomplicated: Secondary | ICD-10-CM | POA: Diagnosis not present

## 2024-06-14 NOTE — Patient Instructions (Signed)
 Mild persistent asthma, previously steroid dependent - well controlled Eosinophilic asthma --Dupixent  since 06/17/23. Continue as scheduled --CONTINUE Advair 230-21 mcg 1-2 puffs TWICE a day --CONTINUE Albuterol  AS NEEDED for shortness of breath

## 2024-06-14 NOTE — Progress Notes (Unsigned)
 Synopsis: Referred in January 2021 for Asthma. Former patient of Dr. Dawna. David [name] Subjective:   PATIENT ID: David [name] Chill GENDER: male DOB: 1945-12-04, MRN: [id number]   HPI  Chief Complaint  Patient presents with   Asthma    Follow up    [name] is a 78 year old male never smoker with severe persistent asthma who presents for follow-up.  Synopsis: He started omalizumab  11/29/19 with significant improvement in his symptoms and has stop using his inhaler on a daily basis. He is using Trelegy ellipta  as needed, maybe a couple times per month.  He was diagnosed with asthma 20 years ago. He was has been on Advair, Symbicort  and Trelegy in the past. He was started on Xolair  in 2021 however he feels recently it has been less effective. Did not tolerate Symbicort  due to cough during inhalation. Trelegy has been starting become less effective as well. He currently has coughing, wheezing and shortness of breath and awakening at night. He is currently on prednisone  and symptoms will recur when he is off steroids. He has required steroid tapers six times in the last year.  01/15/22 Since our last visit inhalers were changed to medium dose Advair HFA. We discussed transitioning to Dupixent  on telephone encounters however he has had improved symptoms on Xolair  so decided to continue this biologic. He reports he is overall doing well on Xolair  and Advair. Not needing albuterol  inhaler. Denies shortness of breath, wheezing or coughing. No nocturnal symptoms.  04/29/22 He is planning for left total shoulder arthroplasty on 05/16/2022. Last exacerbation in 10/2021. Currently on Advair ONE puff TWICE a day due to a cough/sore throat. Reports symptoms are overall well controlled. No nocturnal symptoms. On Xolair . Does report mild cough that usually leads to worsening respiratory symptoms if not addressed.  10/30/22 Since our last visit he was seen in [facility] ED for chest tightness and sore throat.  He was treated with duonebs and prednisone  60 mg. Had hypoxemia to upper 80s. C/b atrial flutter. Was started on anticoagulation by ED but this was determined not to continue by his cardiologist. He had paused Advair use for several months. In December he had a URI.   04/29/23 He reports recent covid infection exposure two days ago at a birthday. Denies any current respiratory symptoms related to this. This summer he has been treated for asthma exacerbation in June. 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 Before his cardiac issues,s he reports his asthma symptoms are have worsened with shortness of breath, chest tightness, cough, nightly awakenings and cough and seem to flare up every 1-2 months. He is intermittently taking inhalers mainly due to it not being as effective as before. He reports coughing with his inhaler and is frustrated.  06/16/23 Since our last visit he was seen the ED on 06/02/23 for atrial fibrillation. He has been having issues with his pacemaker and noted to be in atrial flutter. Started on eliquis  by Cardiology. He was noted to have hypoxemia to 85-88% with exertion but resolved/normalized with rest. In the ED CTA neg for PE. He was treated with IV decadron  followed by prednisone   09/17/23 Since our last visit he has been on Dupixent . He has been compliant with bronchodilators and has had significantly improved symptoms while on biologic. Denies shortness of breath, cough or wheezing since October 2024. Denies nocturnal symptoms. No exacerbations since our last visit. No recent use of rescue inhalers. Had mild flat rash on his legs  not any worse.  12/23/23 Since our last visit he is overall doing well. He reports using a prednisone  back in Feb 2025. Denies shortness of breath, cough or wheezing. Except for pollen irritating his symptoms.   06/14/24 Since our last visit he reports overall doing well. He developed some hoarseness which is worsened by Advair. Denies  shortness of breath, cough or wheezing. Compliant with Dupixent . No nighttime symptoms. No exacerbations. He reports he has not been on eliquis  since 03/03/24 for hemorrhoid surgery.  Asthma Control Test ACT Total Score  06/14/2024  3:25 PM 25  12/23/2023  1:54 PM 25  09/17/2023  9:57 AM 24    Past Medical History:  Diagnosis Date   Altitude sickness    Asthma    exercise-induced; prn inhaler   AV block, 2nd degree    Bilateral inguinal hernia (BIH) 10/16/2012   Overview:  [facility] -Dr. Vanderbilt.  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 Take 1 tablet twice a day and continue for 2-3 days, followed by once nightly. 20 tablet 0   ADVAIR HFA 230-21 MCG/ACT inhaler INHALE 2 PUFFS INTO THE LUNGS TWICE DAILY 12 g 5   albuterol  (VENTOLIN  HFA) 108 (90 Base) MCG/ACT inhaler Inhale 2 puffs into the lungs every 4 (four) hours as needed for wheezing or shortness of breath. 8 g 11   Dupilumab  (DUPIXENT ) 300 MG/2ML SOAJ Inject 300 mg into the skin every 14 (fourteen) days. 12 mL 1   fluticasone  (FLONASE ) 50 MCG/ACT nasal spray Place 1 spray into both nostrils daily. 18.2 mL 2   apixaban  (ELIQUIS ) 5 MG TABS tablet TAKE 1 TABLET(5 MG) BY MOUTH TWICE DAILY (Patient not taking: Reported on 06/14/2024) 180 tablet 2   No facility-administered medications prior to visit.    Review of  Systems  Constitutional:  Negative for chills, diaphoresis, fever, malaise/fatigue and weight loss.  HENT:  Negative for congestion.        Hoarseness  Respiratory:  Negative for cough, hemoptysis, sputum production, shortness of breath and wheezing.   Cardiovascular:  Negative for chest pain, palpitations and leg swelling.    Objective:   Vitals:   06/14/24 1523  BP: (!) 145/95  Pulse: 66  SpO2: 95%  Weight: 164 lb (74.4 kg)  Height: 5' 8 (1.727 m)   Physical Exam: General: Well-appearing, no acute distress HENT: [place], AT Eyes: EOMI, no scleral icterus Respiratory: Clear to auscultation bilaterally.  No crackles, wheezing or rales Cardiovascular: RRR, -M/R/G, no JVD Extremities:-Edema,-tenderness Neuro: AAO x4, CNII-XII grossly intact Psych: Normal mood, normal affect  Data Reviewed  Chest imaging: CTA 04/05/18 - No PE. Minimal bibasilar atelectasis. CXR 09/11/20 - lingular scar. Cardiomegaly. CXR 10/24/21 - Linular scar. Unchanged cardiomegaly CXR 03/12/23 - Left basilar scarring. Cardiomegaly with dual lead AICD CTA 06/02/23 - No PE. Left basilar scarring  PFT  10/18/19 FVC 3.73 (93%) FEV1 2.46 (84%) Ratio 62 TLC 104% DLCO 111% Interpretation: On bronchodilators (Spiriva ). Mild obstructive defect. No significant bronchodilator response however does not preclude benefit of therapy.  Labs IgE  09/29/19 -137 03/22/20 - 297 10/18/21 - 602 02/08/23 - 900 06/02/23 - 900  Absolute eos 02/08/23 - 900    Assessment & Plan:   Discussion: 78 year old male never smoker with severe persistent asthma on Dupixent  who presents for follow-up. Previously failed Xolair  with persistent symptoms and exacerbations including  in Feb, Aug and December 2023 and June and September in 2024.  Currently on Dupixent . Well controlled on current regimen. Discussed clinical course and management of asthma including bronchodilator regimen, preventive care including vaccinations and action plan for  exacerbation.  Previously failed Symbicort  (cough) and Trelegy (ineffective).   Mild persistent asthma, previously steroid dependent - well controlled Eosinophilic asthma --Dupixent  since 06/17/23. Continue as scheduled --CONTINUE Advair 230-21 mcg 1-2 puffs TWICE a day --CONTINUE Albuterol  AS NEEDED for shortness of breath  Hx altitude sickness prevention  --Recommend gradual ascent, hydration for primary prevention --Avoid/minimize alcohol   Prophylaxis: Acetazolamide : 125 mg orally every 12 hours Duration: Start day before ascent and continue 2-3 days at maximum altitude; may use once at night thereafter to improve sleep Treatment: If you develop altitude sickness, take two tabs twice a day for 24 hours or until descent <662ft  Return in about 6 months (around 12/13/2024).   I have spent a total time of 30-minutes on the day of the appointment including chart review, data review, collecting history, coordinating care and discussing medical diagnosis and plan with the patient/family. Past medical history, allergies, medications were reviewed. Pertinent imaging, labs and tests included in this note have been reviewed and interpreted independently by me.  Slater Staff, MD [facility]   Current Outpatient Medications:    acetaZOLAMIDE  (DIAMOX ) 125 MG tablet, Prophylaxis: Start one day before ascent. Take 1 tablet twice a day and continue for 2-3 days, followed by once nightly., Disp: 20 tablet, Rfl: 0   ADVAIR HFA 230-21 MCG/ACT inhaler, INHALE 2 PUFFS INTO THE LUNGS TWICE DAILY, Disp: 12 g, Rfl: 5   albuterol  (VENTOLIN  HFA) 108 (90 Base) MCG/ACT inhaler, Inhale 2 puffs into the lungs every 4 (four) hours as needed for wheezing or shortness of breath., Disp: 8 g, Rfl: 11   Dupilumab  (DUPIXENT ) 300 MG/2ML SOAJ, Inject 300 mg into the skin every 14 (fourteen) days., Disp: 12 mL, Rfl: 1   fluticasone  (FLONASE ) 50 MCG/ACT nasal spray, Place 1 spray into both nostrils  daily., Disp: 18.2 mL, Rfl: 2

## 2024-06-15 ENCOUNTER — Ambulatory Visit (HOSPITAL_COMMUNITY)
Admission: RE | Admit: 2024-06-15 | Discharge: 2024-06-15 | Disposition: A | Discharge status: Home / Self Care | Source: Ambulatory Visit | Attending: Orthopedic Surgery | Admitting: Orthopedic Surgery

## 2024-06-15 DIAGNOSIS — M25562 Pain in left knee: Secondary | ICD-10-CM | POA: Diagnosis not present

## 2024-06-15 NOTE — Progress Notes (Signed)
 Patient was monitored by this RN during MRI scan due to presence of a pacemaker. Cardiac rhythm was continuously monitored throughout the procedure. Prior to the start of the scan, the pacemaker was placed in MRI-safe mode by the MRI technician and/or pacemaker representative. Following the completion of the scan, the device was returned to its pre-MRI settings. Neurological status and orientation post-procedure were unchanged from baseline.   Pre-procedure Heart Rate (Prior to being placed in MRI safe mode): 70 MRI Mode: 85 Post-procedure Heart Rate (Once pacemaker is returned to baseline mode): 62

## 2024-06-17 ENCOUNTER — Other Ambulatory Visit: Payer: Self-pay

## 2024-06-18 ENCOUNTER — Other Ambulatory Visit: Payer: Self-pay

## 2024-06-18 NOTE — Progress Notes (Signed)
 Specialty Pharmacy Refill Coordination Note  David [name] is a 78 y.o. male contacted today regarding refills of specialty medication(s) Dupilumab  (Dupixent )   Patient requested Delivery   Delivery date: 07/08/24   Verified address: [address] [place]   Medication will be filled on 07/07/24.

## 2024-06-18 NOTE — Progress Notes (Signed)
 Specialty Pharmacy Ongoing Clinical Assessment Note  David [name] is a 78 y.o. male who is being followed by the specialty pharmacy service for RxSp Asthma/COPD   Patient's specialty medication(s) reviewed today: Dupilumab  (Dupixent )   Missed doses in the last 4 weeks: 0   Patient/Caregiver did not have any additional questions or concerns.   Therapeutic benefit summary: Patient is achieving benefit   Adverse events/side effects summary: No adverse events/side effects   Patient's therapy is appropriate to: Continue    Goals Addressed             This Visit's Progress    Maintain optimal adherence to therapy   On track    Patient is on track. Patient will maintain adherence, avoid flare triggers, and be monitored by provider to determine if a change in treatment plan is warranted         Follow up: 12 months  David [name] Specialty Pharmacist

## 2024-06-21 DIAGNOSIS — M1712 Unilateral primary osteoarthritis, left knee: Secondary | ICD-10-CM | POA: Diagnosis not present

## 2024-06-21 DIAGNOSIS — M2392 Unspecified internal derangement of left knee: Secondary | ICD-10-CM | POA: Diagnosis not present

## 2024-07-06 ENCOUNTER — Ambulatory Visit (HOSPITAL_COMMUNITY): Admitting: Physician Assistant

## 2024-07-07 ENCOUNTER — Other Ambulatory Visit: Payer: Self-pay

## 2024-07-27 DIAGNOSIS — Z0000 Encounter for general adult medical examination without abnormal findings: Secondary | ICD-10-CM | POA: Diagnosis not present

## 2024-08-23 ENCOUNTER — Other Ambulatory Visit: Payer: Self-pay

## 2024-08-24 ENCOUNTER — Other Ambulatory Visit (HOSPITAL_COMMUNITY): Payer: Self-pay

## 2024-08-25 ENCOUNTER — Other Ambulatory Visit: Payer: Self-pay

## 2024-08-25 NOTE — Progress Notes (Signed)
 Specialty Pharmacy Refill Coordination Note  David [name] is a 78 y.o. male contacted today regarding refills of specialty medication(s) Dupilumab  (Dupixent )   Patient requested Delivery   Delivery date: 09/01/24   Verified address: [address] [place], [place]   Medication will be filled on: 08/31/24

## 2024-08-27 ENCOUNTER — Ambulatory Visit

## 2024-08-27 DIAGNOSIS — I442 Atrioventricular block, complete: Secondary | ICD-10-CM | POA: Diagnosis not present

## 2024-08-29 LAB — CUP PACEART REMOTE DEVICE CHECK
Battery Remaining Longevity: 118 mo
Battery Voltage: 3.02 V
Brady Statistic AP VP Percent: 27.33 %
Brady Statistic AP VS Percent: 0.01 %
Brady Statistic AS VP Percent: 72.12 %
Brady Statistic AS VS Percent: 0.53 %
Brady Statistic RA Percent Paced: 27.15 %
Brady Statistic RV Percent Paced: 99.45 %
Date Time Interrogation Session: 20251226065010
Implantable Lead Connection Status: 753985
Implantable Lead Connection Status: 753985
Implantable Lead Implant Date: 20240625
Implantable Lead Implant Date: 20240710
Implantable Lead Location: 753859
Implantable Lead Location: 753860
Implantable Lead Model: 3830
Implantable Lead Model: 5076
Implantable Pulse Generator Implant Date: 20240625
Lead Channel Impedance Value: 304 Ohm
Lead Channel Impedance Value: 323 Ohm
Lead Channel Impedance Value: 399 Ohm
Lead Channel Impedance Value: 437 Ohm
Lead Channel Pacing Threshold Amplitude: 0.5 V
Lead Channel Pacing Threshold Amplitude: 1 V
Lead Channel Pacing Threshold Pulse Width: 0.4 ms
Lead Channel Pacing Threshold Pulse Width: 0.4 ms
Lead Channel Sensing Intrinsic Amplitude: 13 mV
Lead Channel Sensing Intrinsic Amplitude: 13 mV
Lead Channel Sensing Intrinsic Amplitude: 2 mV
Lead Channel Sensing Intrinsic Amplitude: 2 mV
Lead Channel Setting Pacing Amplitude: 1.5 V
Lead Channel Setting Pacing Amplitude: 2.5 V
Lead Channel Setting Pacing Pulse Width: 0.4 ms
Lead Channel Setting Sensing Sensitivity: 0.9 mV
Zone Setting Status: 755011

## 2024-08-30 ENCOUNTER — Ambulatory Visit: Payer: Self-pay | Admitting: Cardiology

## 2024-08-30 ENCOUNTER — Telehealth: Payer: Self-pay

## 2024-08-30 ENCOUNTER — Other Ambulatory Visit (HOSPITAL_COMMUNITY): Payer: Self-pay

## 2024-08-30 NOTE — Telephone Encounter (Signed)
 Spoke with patient:   He was very professional in his response to me but let me know very quickly the following information to share with Dr. Inocencio   He has zero trust in the device clinic and our phone calls reporting events due to his hx of numerous calls where we missed his lead failure issue.  Also, states there have been incidences where we told him he was in Afib and he went to the ER and they told him he was NOT in Afib.    He is not on his Eliquis  because he states our office told him to stop taking it back in February for his surgery and not to start it back after surgery unless we told him to.  So if he was supposed to still be taking it, it is our fault.  But, he doesn't feel confident that the amount of AF he has had is really enough to be concerned with taking it.   He has not had any symptoms and is not aware that he is going in and out of Afib.  He doesn't really know whether to trust me or not regarding if he has truly had more AF.      Patient's overall AF burden is low at 1%, with short episodes of Afib reported by device that appear to be true according to EGM's.  Longest duration at present is an 8 hour event on 10/31.   At this point, if anything needs to be discussed today regarding patient's current AFIB or OAC restart, I suggest the provider call the patient or we set up an in office visit to review.   Patient reports little faith or trust in the information we provide him from the device clinic.   Forwarding to Dr. Inocencio for review.

## 2024-08-30 NOTE — Progress Notes (Signed)
 Remote PPM Transmission

## 2024-08-30 NOTE — Telephone Encounter (Addendum)
 Pacemaker: Scheduled remote reviewed. Normal device function.   Presenting rhythm: AS/VP 1 NSVT, V>A 8 beats, V-rate 155 bpm Known PAF, AF burden 1%, longest 8 hrs 13 min (Epic with conflicting information if patient is taking Eliquis  or not).   Need to call and clarify if patient is taking his OAC and if has had any symptoms.

## 2024-09-30 NOTE — Addendum Note (Signed)
 Addended by: Rodina Pinales L on: 09/30/2024 12:42 PM   Modules accepted: Orders

## 2024-11-26 ENCOUNTER — Encounter

## 2025-01-06 ENCOUNTER — Ambulatory Visit (HOSPITAL_BASED_OUTPATIENT_CLINIC_OR_DEPARTMENT_OTHER): Admitting: Pulmonary Disease

## 2025-02-25 ENCOUNTER — Encounter

## 2025-05-27 ENCOUNTER — Encounter
